# Patient Record
Sex: Female | Born: 1960
Health system: Southern US, Community
[De-identification: ages and names within clinical notes are randomized; demographics above are authoritative.]

## PROBLEM LIST (undated history)

## (undated) DIAGNOSIS — M1990 Unspecified osteoarthritis, unspecified site: Secondary | ICD-10-CM

## (undated) DIAGNOSIS — D649 Anemia, unspecified: Secondary | ICD-10-CM

## (undated) DIAGNOSIS — I10 Essential (primary) hypertension: Secondary | ICD-10-CM

## (undated) DIAGNOSIS — C801 Malignant (primary) neoplasm, unspecified: Secondary | ICD-10-CM

## (undated) HISTORY — PX: TUBAL LIGATION: SHX77

## (undated) HISTORY — DX: Essential (primary) hypertension: I10

## (undated) HISTORY — DX: Anemia, unspecified: D64.9

## (undated) HISTORY — DX: Unspecified osteoarthritis, unspecified site: M19.90

---

## 1999-08-05 ENCOUNTER — Other Ambulatory Visit: Admission: RE | Admit: 1999-08-05 | Discharge: 1999-08-05 | Payer: Self-pay | Admitting: Obstetrics and Gynecology

## 2004-11-17 ENCOUNTER — Ambulatory Visit: Payer: Self-pay | Admitting: *Deleted

## 2004-11-18 ENCOUNTER — Inpatient Hospital Stay (HOSPITAL_COMMUNITY): Admission: AD | Admit: 2004-11-18 | Discharge: 2004-11-24 | Payer: Self-pay | Admitting: *Deleted

## 2004-11-18 ENCOUNTER — Ambulatory Visit: Payer: Self-pay | Admitting: Obstetrics and Gynecology

## 2012-10-09 ENCOUNTER — Ambulatory Visit: Payer: 59

## 2012-10-09 ENCOUNTER — Ambulatory Visit (INDEPENDENT_AMBULATORY_CARE_PROVIDER_SITE_OTHER): Payer: 59 | Admitting: Emergency Medicine

## 2012-10-09 VITALS — BP 172/108 | HR 122 | Temp 98.5°F | Resp 18 | Ht 63.25 in | Wt 195.6 lb

## 2012-10-09 DIAGNOSIS — M129 Arthropathy, unspecified: Secondary | ICD-10-CM

## 2012-10-09 DIAGNOSIS — M25569 Pain in unspecified knee: Secondary | ICD-10-CM

## 2012-10-09 DIAGNOSIS — M1990 Unspecified osteoarthritis, unspecified site: Secondary | ICD-10-CM

## 2012-10-09 DIAGNOSIS — I10 Essential (primary) hypertension: Secondary | ICD-10-CM

## 2012-10-09 DIAGNOSIS — I160 Hypertensive urgency: Secondary | ICD-10-CM | POA: Insufficient documentation

## 2012-10-09 LAB — COMPREHENSIVE METABOLIC PANEL
ALT: 11 U/L (ref 0–35)
AST: 15 U/L (ref 0–37)
Albumin: 4.6 g/dL (ref 3.5–5.2)
BUN: 15 mg/dL (ref 6–23)
CO2: 22 mEq/L (ref 19–32)
Calcium: 9.5 mg/dL (ref 8.4–10.5)
Glucose, Bld: 90 mg/dL (ref 70–99)
Total Protein: 7.9 g/dL (ref 6.0–8.3)

## 2012-10-09 LAB — POCT CBC
Hemoglobin: 10.7 g/dL — AB (ref 12.2–16.2)
MCHC: 30.5 g/dL — AB (ref 31.8–35.4)
MPV: 8.8 fL (ref 0–99.8)
POC Granulocyte: 6.6 (ref 2–6.9)
POC MID %: 6.3 %M (ref 0–12)
WBC: 10.2 10*3/uL (ref 4.6–10.2)

## 2012-10-09 MED ORDER — METOPROLOL TARTRATE 25 MG PO TABS: 25.0000 mg | ORAL_TABLET | Freq: Two times a day (BID) | ORAL | Status: DC

## 2012-10-09 MED ORDER — MELOXICAM 7.5 MG PO TABS: ORAL_TABLET | ORAL | Status: DC

## 2012-10-09 MED ORDER — HYDROCHLOROTHIAZIDE 12.5 MG PO TABS: 12.5000 mg | ORAL_TABLET | Freq: Every day | ORAL | Status: DC

## 2012-10-09 NOTE — Progress Notes (Signed)
  Subjective:    Patient ID: Olivia Patel, female    DOB: 1960-10-23, 52 y.o.   MRN: [id number]  HPI patient works at [employer]. She has pain in both of her knees. She has difficulty with walking. She has had no giving way and no locking. She also has a history of hypertension for at least 8 years. She's never taken medication for this the past    Review of Systems     Objective:   Physical Exam HEENT exam is unremarkable chest clear cardiac exam reveals tachycardia without murmurs or gallops. Extremities without edema.  [facility] reading (PRIMARY) by  Dr. Delorise Royals is narrowing of the medial joint space bilaterally          Assessment & Plan:  Will treat with Metoprolol 25 bid HCTZ 12.5 QD Will treat arthritis in knees with Mobic

## 2012-10-09 NOTE — Patient Instructions (Signed)

## 2012-10-23 ENCOUNTER — Ambulatory Visit (INDEPENDENT_AMBULATORY_CARE_PROVIDER_SITE_OTHER): Payer: 59 | Admitting: Emergency Medicine

## 2012-10-23 VITALS — BP 140/88 | HR 78 | Temp 98.7°F | Resp 16 | Ht 63.0 in | Wt 196.6 lb

## 2012-10-23 DIAGNOSIS — I10 Essential (primary) hypertension: Secondary | ICD-10-CM

## 2012-10-23 DIAGNOSIS — M25569 Pain in unspecified knee: Secondary | ICD-10-CM

## 2012-10-23 NOTE — Patient Instructions (Signed)

## 2012-10-23 NOTE — Progress Notes (Signed)
[facility] [address] [place] [address] [phone]  Date:  10/23/2012   Name:  Olivia Patel   DOB:  1960/06/14   MRN:  [id number]  PCP:  No PCP Per Patient    Chief Complaint: Follow-up   History of Present Illness:  Olivia Patel is a 52 y.o. very pleasant female patient who presents with the following:  Seen two weeks ago by DR Cleta Alberts and put on toprol for hypertension. She said she feels better blood pressure wise but is somewhat tired as an effect of the medications.  Says she has no end organ symptoms.  Regarding her knee pain, she is experiencing some improvement with her medication.  Her pain is less and she has better mobility and less effusion.  Taking 7.5 mg daily.  No improvement with over the counter medications or other home remedies. Denies other complaint or health concern today.   Patient Active Problem List   Diagnosis Date Noted  . Unspecified essential hypertension 10/09/2012  . Arthritis 10/09/2012    History reviewed. No pertinent past medical history.  History reviewed. No pertinent past surgical history.  History  Substance Use Topics  . Smoking status: Never Smoker   . Smokeless tobacco: Not on file  . Alcohol Use: Not on file    Family History  Problem Relation Age of Onset  . Hypertension Mother   . Diabetes Sister     No Known Allergies  Medication list has been reviewed and updated.  Current Outpatient Prescriptions on File Prior to Visit  Medication Sig Dispense Refill  . hydrochlorothiazide (HYDRODIURIL) 12.5 MG tablet Take 1 tablet (12.5 mg total) by mouth daily.  30 tablet  5  . ibuprofen (ADVIL,MOTRIN) 200 MG tablet Take 200 mg by mouth every 6 (six) hours as needed for pain.      . meloxicam (MOBIC) 7.5 MG tablet Take 1 to 2 tabs daily  30 tablet  0  . metoprolol tartrate (LOPRESSOR) 25 MG tablet Take 1 tablet (25 mg total) by mouth 2 (two) times daily.  60 tablet  5   No current  facility-administered medications on file prior to visit.    Review of Systems:  As per HPI, otherwise negative.    Physical Examination: Filed Vitals:   10/23/12 1553  BP: 140/88  Pulse: 78  Temp: 98.7 F (37.1 C)  Resp: 16   Filed Vitals:   10/23/12 1553  Height: 5\' 3"  (1.6 m)  Weight: 196 lb 9.6 oz (89.177 kg)   Body mass index is 34.83 kg/(m^2). Ideal Body Weight: Weight in (lb) to have BMI = 25: 140.8   GEN: WDWN, NAD, Non-toxic, Alert & Oriented x 3 HEENT: Atraumatic, Normocephalic.  Ears and Nose: No external deformity. EXTR: No clubbing/cyanosis/edema NEURO: Normal gait.  PSYCH: Normally interactive. Conversant. Not depressed or anxious appearing.  Calm demeanor.  RIGHT knee no effusion.  Assessment and Plan: Hypertension under better control Knee pain Increase mobic to 15. Mg daily Follow up with DR Daub in 104   Signed,  Phillips Odor, MD

## 2013-04-10 DIAGNOSIS — Z0271 Encounter for disability determination: Secondary | ICD-10-CM

## 2013-05-22 ENCOUNTER — Other Ambulatory Visit: Payer: Self-pay | Admitting: Emergency Medicine

## 2013-06-15 ENCOUNTER — Other Ambulatory Visit: Payer: Self-pay | Admitting: Physician Assistant

## 2015-09-01 ENCOUNTER — Telehealth: Payer: Self-pay | Admitting: Family

## 2015-09-01 ENCOUNTER — Other Ambulatory Visit: Payer: Self-pay | Admitting: Family

## 2015-09-01 ENCOUNTER — Encounter: Payer: Self-pay | Admitting: Family

## 2015-09-01 ENCOUNTER — Ambulatory Visit (INDEPENDENT_AMBULATORY_CARE_PROVIDER_SITE_OTHER): Payer: Medicare Other | Admitting: Family

## 2015-09-01 ENCOUNTER — Other Ambulatory Visit (INDEPENDENT_AMBULATORY_CARE_PROVIDER_SITE_OTHER): Payer: Medicare Other

## 2015-09-01 VITALS — BP 182/102 | HR 103 | Temp 98.1°F | Resp 16 | Ht 63.25 in | Wt 207.0 lb

## 2015-09-01 DIAGNOSIS — I160 Hypertensive urgency: Secondary | ICD-10-CM | POA: Diagnosis not present

## 2015-09-01 DIAGNOSIS — I10 Essential (primary) hypertension: Secondary | ICD-10-CM | POA: Diagnosis not present

## 2015-09-01 DIAGNOSIS — M1990 Unspecified osteoarthritis, unspecified site: Secondary | ICD-10-CM | POA: Diagnosis not present

## 2015-09-01 DIAGNOSIS — Z23 Encounter for immunization: Secondary | ICD-10-CM | POA: Diagnosis not present

## 2015-09-01 DIAGNOSIS — Z124 Encounter for screening for malignant neoplasm of cervix: Secondary | ICD-10-CM

## 2015-09-01 DIAGNOSIS — Z1231 Encounter for screening mammogram for malignant neoplasm of breast: Secondary | ICD-10-CM | POA: Diagnosis not present

## 2015-09-01 LAB — BASIC METABOLIC PANEL
BUN: 10 mg/dL (ref 6–23)
CO2: 29 mEq/L (ref 19–32)
Calcium: 9.5 mg/dL (ref 8.4–10.5)
Chloride: 102 mEq/L (ref 96–112)
Creatinine, Ser: 0.86 mg/dL (ref 0.40–1.20)
GFR: 88.87 mL/min (ref >60.00)
Glucose, Bld: 106 mg/dL — ABNORMAL HIGH (ref 70–99)
POTASSIUM: 4.1 meq/L (ref 3.5–5.1)
Sodium: 138 mEq/L (ref 135–145)

## 2015-09-01 MED ORDER — LABETALOL HCL 100 MG PO TABS: 100.0000 mg | ORAL_TABLET | Freq: Two times a day (BID) | ORAL | Status: DC

## 2015-09-01 MED ORDER — MELOXICAM 15 MG PO TABS: 15.0000 mg | ORAL_TABLET | Freq: Every day | ORAL | Status: DC

## 2015-09-01 MED ORDER — HYDROCHLOROTHIAZIDE 12.5 MG PO TABS: 12.5000 mg | ORAL_TABLET | Freq: Every day | ORAL | Status: DC

## 2015-09-01 NOTE — Assessment & Plan Note (Signed)
Blood pressure elevated above goal 140/90 with current regimen and places patient and hypertensive urgency. Start labetalol. Start hydrochlorothiazide. Encouraged to monitor blood pressure at home. Obtain basic metabolic panel to check baseline potassium and kidney function. No symptoms of end organ damage presently. Denies worse headache of her life. Information regarding Dash eating plan provided. Follow-up in 2 weeks or sooner if symptoms worsen.

## 2015-09-01 NOTE — Progress Notes (Signed)
Subjective:    Patient ID: Olivia Patel, female    DOB: 07-Oct-1960, 55 y.o.   MRN: [id number]  Chief Complaint  Patient presents with  . Establish Care    having issues with both knees, hip shoulder and hands, has arthritis    HPI:  Olivia Patel is a 55 y.o. female who  has a past medical history of Hypertension and Arthritis. and presents today to establish care.   1.) Hypertension - Previously diagnosed with hypertension and does have a significant family history of hypertension. Previously maintained on metoprolol and hydrochlorothiazide. Does not currently take her blood pressure at home. Denies symptoms of end organ damage.  BP Readings from Last 3 Encounters:  09/01/15 182/102  10/23/12 140/88  10/09/12 172/108    2.) Arthritis - Associated symptom of pain located in multiple joints including her bilateral knees, left hip and right shoulder have been going on for about 3 years. 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 Modifying factors include ibuprofen and Tylenol which does help with her pain when she takes it. No structured exercise. Course of the symptoms has progressively been worsening. Severity is enough to effect her activities on a daily basis.   No Known Allergies   Outpatient Prescriptions Prior to Visit  Medication Sig Dispense Refill  . hydrochlorothiazide (MICROZIDE) 12.5 MG capsule Take 1 capsule (12.5 mg total) by mouth daily. PATIENT NEEDS OFFICE VISIT FOR ADDITIONAL REFILLS 30 capsule 0  . ibuprofen (ADVIL,MOTRIN) 200 MG tablet Take 200 mg by mouth every 6 (six) hours as needed for pain.    . meloxicam (MOBIC) 7.5 MG tablet Take 1 to 2 tabs daily 30 tablet 0  . metoprolol tartrate (LOPRESSOR) 25 MG tablet Take 1 tablet (25 mg total) by mouth 2 (two) times daily. PATIENT NEEDS OFFICE VISIT FOR ADDITIONAL REFILLS 60 tablet 0   No facility-administered medications prior to visit.     Past  Medical History  Diagnosis Date  . Hypertension   . Arthritis      History reviewed. No pertinent past surgical history.   Family History  Problem Relation Age of Onset  . Hypertension Mother   . Diabetes Mother   . Diabetes Sister   . Hypertension Father      Social History   Social History  . Marital Status: Single    Spouse Name: N/A  . Number of Children: 6  . Years of Education: 12   Occupational History  . Disability    Social History Main Topics  . Smoking status: Never Smoker   . Smokeless tobacco: Never Used  . Alcohol Use: No  . Drug Use: No  . Sexual Activity: Not on file   Other Topics Concern  . Not on file   Social History Narrative     Review of Systems  Constitutional: Negative for fever and chills.  Respiratory: Negative for chest tightness and shortness of breath.   Cardiovascular: Negative for chest pain, palpitations and leg swelling.  Neurological: Negative for dizziness, weakness and headaches.      Objective:    BP 182/102 mmHg  Pulse 103  Temp(Src) 98.1 F (36.7 C) (Oral)  Resp 16  Ht 5' 3.25" (1.607 m)  Wt 207 lb (93.895 kg)  BMI 36.36 kg/m2  SpO2 97% Nursing note and vital signs reviewed.  Physical Exam  Constitutional: She is oriented to person, place, and time. She appears well-developed and well-nourished. No distress.  Cardiovascular:  Normal rate, regular rhythm, normal heart sounds and intact distal pulses.   Pulmonary/Chest: Effort normal and breath sounds normal.  Musculoskeletal:  Bilateral knees - no obvious deformity, discoloration, or edema noted. No palpable tenderness able to be elicited. There is discomfort with active range of motion bilaterally. Distal pulses and sensation are intact and appropriate. Ligamentous and meniscal testing are negative.  Neurological: She is alert and oriented to person, place, and time.  Skin: Skin is warm and dry.  Psychiatric: She has a normal mood and affect. Her behavior  is normal. Judgment and thought content normal.       Assessment & Plan:   Problem List Items Addressed This Visit      Cardiovascular and Mediastinum   Hypertensive urgency - Primary    Blood pressure elevated above goal 140/90 with current regimen and places patient and hypertensive urgency. Start labetalol. Start hydrochlorothiazide. Encouraged to monitor blood pressure at home. Obtain basic metabolic panel to check baseline potassium and kidney function. No symptoms of end organ damage presently. Denies worse headache of her life. Information regarding Dash eating plan provided. Follow-up in 2 weeks or sooner if symptoms worsen.      Relevant Medications   labetalol (NORMODYNE) 100 MG tablet   hydrochlorothiazide (HYDRODIURIL) 12.5 MG tablet   Other Relevant Orders   Basic Metabolic Panel (BMET)     Musculoskeletal and Integument   Arthritis    Symptoms and exam consistent with osteoarthritis and confirmed by previous x-rays in 2014. Treat conservatively with Mobic, ice, and home exercise therapy. If symptoms worsen or do not improve consider cortisone injections.      Relevant Medications   meloxicam (MOBIC) 15 MG tablet    Other Visit Diagnoses    Cervical cancer screening        Relevant Orders    Ambulatory referral to Gynecology    Encounter for screening mammogram for breast cancer        Relevant Orders    MM DIGITAL SCREENING BILATERAL        I have discontinued Ms. Iacovelli's ibuprofen, meloxicam, hydrochlorothiazide, and metoprolol tartrate. I am also having her start on meloxicam, labetalol, and hydrochlorothiazide.   Meds ordered this encounter  Medications  . meloxicam (MOBIC) 15 MG tablet    Sig: Take 1 tablet (15 mg total) by mouth daily.    Dispense:  30 tablet    Refill:  2    Order Specific Question:  Supervising Provider    Answer:  Pricilla Holm A [[id number]]  . labetalol (NORMODYNE) 100 MG tablet    Sig: Take 1 tablet (100 mg total) by mouth 2  (two) times daily.    Dispense:  60 tablet    Refill:  0    Order Specific Question:  Supervising Provider    Answer:  Pricilla Holm A [[id number]]  . hydrochlorothiazide (HYDRODIURIL) 12.5 MG tablet    Sig: Take 1 tablet (12.5 mg total) by mouth daily.    Dispense:  30 tablet    Refill:  1    Order Specific Question:  Supervising Provider    Answer:  Pricilla Holm A [[id number]]     Follow-up: Return in about 2 weeks (around 09/15/2015).  Mauricio Po, FNP

## 2015-09-01 NOTE — Telephone Encounter (Signed)
Please inform patient that her kidney function and electrolytes are within the normal ranges. Please continue to take the blood pressure medications as prescribed today and follow-up in 2 weeks.

## 2015-09-01 NOTE — Assessment & Plan Note (Signed)
Symptoms and exam consistent with osteoarthritis and confirmed by previous x-rays in 2014. Treat conservatively with Mobic, ice, and home exercise therapy. If symptoms worsen or do not improve consider cortisone injections.

## 2015-09-01 NOTE — Progress Notes (Signed)
Pre visit review using our clinic review tool, if applicable. No additional management support is needed unless otherwise documented below in the visit note. 

## 2015-09-01 NOTE — Patient Instructions (Signed)
Thank you for choosing [facility].  Summary/Instructions:  Ice your knees and shoulder 2-3 times per day and after activity.  Start the mobic as needed for inflammation and pain.  They will call to schedule your mammogram and gynecology appointment.   Please monitor your blood pressure at home.   Your prescription(s) have been submitted to your pharmacy or been printed and provided for you. Please take as directed and contact our office if you believe you are having problem(s) with the medication(s) or have any questions.  Please stop by the lab on the basement level of the building for your blood work. Your results will be released to [facility] (or called to you) after review, usually within 72 hours after test completion. If any changes need to be made, you will be notified at that same time.  Please stop by radiology on the basement level of the building for your x-rays. Your results will be released to [facility] (or called to you) after review, usually within 72 hours after test completion. If any treatments or changes are necessary, you will be notified at that same time.  Referrals have been made during this visit. You should expect to hear back from our schedulers in about 7-10 days in regards to establishing an appointment with the specialists we discussed.   If your symptoms worsen or fail to improve, please contact our office for further instruction, or in case of emergency go directly to the emergency room at the closest medical facility.   Hypertension Hypertension, commonly called high blood pressure, is when the force of blood pumping through your arteries is too strong. Your arteries are the blood vessels that carry blood from your heart throughout your body. A blood pressure reading consists of a higher number over a lower number, such as 110/72. The higher number (systolic) is the pressure inside your arteries when your heart pumps. The lower number (diastolic) is the  pressure inside your arteries when your heart relaxes. Ideally you want your blood pressure below 120/80. Hypertension forces your heart to work harder to pump blood. Your arteries may become narrow or stiff. Having untreated or uncontrolled hypertension can cause heart attack, stroke, kidney disease, and other problems. RISK FACTORS Some risk factors for high blood pressure are controllable. Others are not.  Risk factors you cannot control include:   Race. You may be at higher risk if you are African American.  Age. Risk increases with age.  Gender. Men are at higher risk than women before age 82 years. After age 2, women are at higher risk than men. Risk factors you can control include:  Not getting enough exercise or physical activity.  Being overweight.  Getting too much fat, sugar, calories, or salt in your diet.  Drinking too much alcohol. SIGNS AND SYMPTOMS Hypertension does not usually cause signs or symptoms. Extremely high blood pressure (hypertensive crisis) may cause headache, anxiety, shortness of breath, and nosebleed. DIAGNOSIS To check if you have hypertension, your health care provider will measure your blood pressure while you are seated, with your arm held at the level of your heart. It should be measured at least twice using the same arm. Certain conditions can cause a difference in blood pressure between your right and left arms. A blood pressure reading that is higher than normal on one occasion does not mean that you need treatment. If it is not clear whether you have high blood pressure, you may be asked to return on a different day to  have your blood pressure checked again. Or, you may be asked to monitor your blood pressure at home for 1 or more weeks. TREATMENT Treating high blood pressure includes making lifestyle changes and possibly taking medicine. Living a healthy lifestyle can help lower high blood pressure. You may need to change some of your  habits. Lifestyle changes may include:  Following the DASH diet. This diet is high in fruits, vegetables, and whole grains. It is low in salt, red meat, and added sugars.  Keep your sodium intake below 2,300 mg per day.  Getting at least 30-45 minutes of aerobic exercise at least 4 times per week.  Losing weight if necessary.  Not smoking.  Limiting alcoholic beverages.  Learning ways to reduce stress. Your health care provider may prescribe medicine if lifestyle changes are not enough to get your blood pressure under control, and if one of the following is true:  You are 43-28 years of age and your systolic blood pressure is above 140.  You are 54 years of age or older, and your systolic blood pressure is above 150.  Your diastolic blood pressure is above 90.  You have diabetes, and your systolic blood pressure is over 761 or your diastolic blood pressure is over 90.  You have kidney disease and your blood pressure is above 140/90.  You have heart disease and your blood pressure is above 140/90. Your personal target blood pressure may vary depending on your medical conditions, your age, and other factors. HOME CARE INSTRUCTIONS  Have your blood pressure rechecked as directed by your health care provider.   Take medicines only as directed by your health care provider. Follow the directions carefully. Blood pressure medicines must be taken as prescribed. The medicine does not work as well when you skip doses. Skipping doses also puts you at risk for problems.  Do not smoke.   Monitor your blood pressure at home as directed by your health care provider. SEEK MEDICAL CARE IF:   You think you are having a reaction to medicines taken.  You have recurrent headaches or feel dizzy.  You have swelling in your ankles.  You have trouble with your vision. SEEK IMMEDIATE MEDICAL CARE IF:  You develop a severe headache or confusion.  You have unusual weakness, numbness, or  feel faint.  You have severe chest or abdominal pain.  You vomit repeatedly.  You have trouble breathing. MAKE SURE YOU:   Understand these instructions.  Will watch your condition.  Will get help right away if you are not doing well or get worse.   This information is not intended to replace advice given to you by your health care provider. Make sure you discuss any questions you have with your health care provider.   Document Released: 04/12/2005 Document Revised: 08/27/2014 Document Reviewed: 02/02/2013 Elsevier Interactive Patient Education 2016 [place] DASH stands for "Dietary Approaches to Stop Hypertension." The DASH eating plan is a healthy eating plan that has been shown to reduce high blood pressure (hypertension). Additional health benefits may include reducing the risk of type 2 diabetes mellitus, heart disease, and stroke. The DASH eating plan may also help with weight loss. WHAT DO I NEED TO KNOW ABOUT THE DASH EATING PLAN? For the DASH eating plan, you will follow these general guidelines:  Choose foods with a percent daily value for sodium of less than 5% (as listed on the food label).  Use salt-free seasonings or herbs instead of table  salt or sea salt.  Check with your health care provider or pharmacist before using salt substitutes.  Eat lower-sodium products, often labeled as "lower sodium" or "no salt added."  Eat fresh foods.  Eat more vegetables, fruits, and low-fat dairy products.  Choose whole grains. Look for the word "whole" as the first word in the ingredient list.  Choose fish and skinless chicken or [place] more often than red meat. Limit fish, poultry, and meat to 6 oz (170 g) each day.  Limit sweets, desserts, sugars, and sugary drinks.  Choose heart-healthy fats.  Limit cheese to 1 oz (28 g) per day.  Eat more home-cooked food and less restaurant, buffet, and fast food.  Limit fried foods.  Cook foods using  methods other than frying.  Limit canned vegetables. If you do use them, rinse them well to decrease the sodium.  When eating at a restaurant, ask that your food be prepared with less salt, or no salt if possible. WHAT FOODS CAN I EAT? Seek help from a dietitian for individual calorie needs. Grains Whole grain or whole wheat bread. Brown rice. Whole grain or whole wheat pasta. Quinoa, bulgur, and whole grain cereals. Low-sodium cereals. Corn or whole wheat flour tortillas. Whole grain cornbread. Whole grain crackers. Low-sodium crackers. Vegetables Fresh or frozen vegetables (raw, steamed, roasted, or grilled). Low-sodium or reduced-sodium tomato and vegetable juices. Low-sodium or reduced-sodium tomato sauce and paste. Low-sodium or reduced-sodium canned vegetables.  Fruits All fresh, canned (in natural juice), or frozen fruits. Meat and Other Protein Products Ground beef (85% or leaner), grass-fed beef, or beef trimmed of fat. Skinless chicken or [place]. Ground chicken or [place]. Pork trimmed of fat. All fish and seafood. Eggs. Dried beans, peas, or lentils. Unsalted nuts and seeds. Unsalted canned beans. Dairy Low-fat dairy products, such as skim or 1% milk, 2% or reduced-fat cheeses, low-fat ricotta or cottage cheese, or plain low-fat yogurt. Low-sodium or reduced-sodium cheeses. Fats and Oils Tub margarines without trans fats. Light or reduced-fat mayonnaise and salad dressings (reduced sodium). Avocado. Safflower, olive, or canola oils. Natural peanut or almond butter. Other Unsalted popcorn and pretzels. The items listed above may not be a complete list of recommended foods or beverages. Contact your dietitian for more options. WHAT FOODS ARE NOT RECOMMENDED? Grains White bread. White pasta. White rice. Refined cornbread. Bagels and croissants. Crackers that contain trans fat. Vegetables Creamed or fried vegetables. Vegetables in a cheese sauce. Regular canned vegetables. Regular  canned tomato sauce and paste. Regular tomato and vegetable juices. Fruits Dried fruits. Canned fruit in light or heavy syrup. Fruit juice. Meat and Other Protein Products Fatty cuts of meat. Ribs, chicken wings, bacon, sausage, bologna, salami, chitterlings, fatback, hot dogs, bratwurst, and packaged luncheon meats. Salted nuts and seeds. Canned beans with salt. Dairy Whole or 2% milk, cream, half-and-half, and cream cheese. Whole-fat or sweetened yogurt. Full-fat cheeses or blue cheese. Nondairy creamers and whipped toppings. Processed cheese, cheese spreads, or cheese curds. Condiments Onion and garlic salt, seasoned salt, table salt, and sea salt. Canned and packaged gravies. Worcestershire sauce. Tartar sauce. Barbecue sauce. Teriyaki sauce. Soy sauce, including reduced sodium. Steak sauce. Fish sauce. Oyster sauce. Cocktail sauce. Horseradish. Ketchup and mustard. Meat flavorings and tenderizers. Bouillon cubes. Hot sauce. Tabasco sauce. Marinades. Taco seasonings. Relishes. Fats and Oils Butter, stick margarine, lard, shortening, ghee, and bacon fat. Coconut, palm kernel, or palm oils. Regular salad dressings. Other Pickles and olives. Salted popcorn and pretzels. The items listed above may not be  a complete list of foods and beverages to avoid. Contact your dietitian for more information. WHERE CAN I FIND MORE INFORMATION? National Heart, Lung, and Blood Institute: travelstabloid.com   This information is not intended to replace advice given to you by your health care provider. Make sure you discuss any questions you have with your health care provider.   Document Released: 04/01/2011 Document Revised: 05/03/2014 Document Reviewed: 02/14/2013 Elsevier Interactive Patient Education 2016 Elsevier Inc.  Osteoarthritis Osteoarthritis is a disease that causes soreness and inflammation of a joint. It occurs when the cartilage at the affected joint wears  down. Cartilage acts as a cushion, covering the ends of bones where they meet to form a joint. Osteoarthritis is the most common form of arthritis. It often occurs in older people. The joints affected most often by this condition include those in the:  Ends of the fingers.  Thumbs.  Neck.  Lower back.  Knees.  Hips. CAUSES  Over time, the cartilage that covers the ends of bones begins to wear away. This causes bone to rub on bone, producing pain and stiffness in the affected joints.  RISK FACTORS Certain factors can increase your chances of having osteoarthritis, including:  Older age.  Excessive body weight.  Overuse of joints.  Previous joint injury. SIGNS AND SYMPTOMS   Pain, swelling, and stiffness in the joint.  Over time, the joint may lose its normal shape.  Small deposits of bone (osteophytes) may grow on the edges of the joint.  Bits of bone or cartilage can break off and float inside the joint space. This may cause more pain and damage. DIAGNOSIS  Your health care provider will do a physical exam and ask about your symptoms. Various tests may be ordered, such as:  X-rays of the affected joint.  Blood tests to rule out other types of arthritis. Additional tests may be used to diagnose your condition. TREATMENT  Goals of treatment are to control pain and improve joint function. Treatment plans may include:  A prescribed exercise program that allows for rest and joint relief.  A weight control plan.  Pain relief techniques, such as:  Properly applied heat and cold.  Electric pulses delivered to nerve endings under the skin (transcutaneous electrical nerve stimulation [TENS]).  Massage.  Certain nutritional supplements.  Medicines to control pain, such as:  Acetaminophen.  Nonsteroidal anti-inflammatory drugs (NSAIDs), such as naproxen.  Narcotic or central-acting agents, such as tramadol.  Corticosteroids. These can be given orally or as an  injection.  Surgery to reposition the bones and relieve pain (osteotomy) or to remove loose pieces of bone and cartilage. Joint replacement may be needed in advanced states of osteoarthritis. HOME CARE INSTRUCTIONS   Take medicines only as directed by your health care provider.  Maintain a healthy weight. Follow your health care provider's instructions for weight control. This may include dietary instructions.  Exercise as directed. Your health care provider can recommend specific types of exercise. These may include:  Strengthening exercises. These are done to strengthen the muscles that support joints affected by arthritis. They can be performed with weights or with exercise bands to add resistance.  Aerobic activities. These are exercises, such as brisk walking or low-impact aerobics, that get your heart pumping.  Range-of-motion activities. These keep your joints limber.  Balance and agility exercises. These help you maintain daily living skills.  Rest your affected joints as directed by your health care provider.  Keep all follow-up visits as directed by your health  care provider. SEEK MEDICAL CARE IF:   Your skin turns red.  You develop a rash in addition to your joint pain.  You have worsening joint pain.  You have a fever along with joint or muscle aches. SEEK IMMEDIATE MEDICAL CARE IF:  You have a significant loss of weight or appetite.  You have night sweats. [place] of Arthritis and Musculoskeletal and Skin Diseases: www.niams.SouthExposed.es  [employer] on Aging: http://kim-miller.com/  American College of Rheumatology: www.rheumatology.org   This information is not intended to replace advice given to you by your health care provider. Make sure you discuss any questions you have with your health care provider.   Document Released: 04/12/2005 Document Revised: 05/03/2014 Document Reviewed: 12/18/2012 Elsevier Interactive Patient  Education [employer].

## 2015-09-02 NOTE — Telephone Encounter (Signed)
Pt aware of results and follow up appt scheduled.

## 2015-09-10 ENCOUNTER — Encounter: Payer: Self-pay | Admitting: Obstetrics and Gynecology

## 2015-09-10 ENCOUNTER — Ambulatory Visit (INDEPENDENT_AMBULATORY_CARE_PROVIDER_SITE_OTHER): Payer: Medicare Other | Admitting: Obstetrics and Gynecology

## 2015-09-10 VITALS — BP 140/80 | HR 80 | Resp 16 | Ht 63.25 in | Wt 205.0 lb

## 2015-09-10 DIAGNOSIS — N926 Irregular menstruation, unspecified: Secondary | ICD-10-CM | POA: Diagnosis not present

## 2015-09-10 DIAGNOSIS — Z01419 Encounter for gynecological examination (general) (routine) without abnormal findings: Secondary | ICD-10-CM | POA: Diagnosis not present

## 2015-09-10 DIAGNOSIS — Z124 Encounter for screening for malignant neoplasm of cervix: Secondary | ICD-10-CM | POA: Diagnosis not present

## 2015-09-10 DIAGNOSIS — Z1211 Encounter for screening for malignant neoplasm of colon: Secondary | ICD-10-CM | POA: Diagnosis not present

## 2015-09-10 DIAGNOSIS — N951 Menopausal and female climacteric states: Secondary | ICD-10-CM

## 2015-09-10 DIAGNOSIS — N841 Polyp of cervix uteri: Secondary | ICD-10-CM | POA: Diagnosis not present

## 2015-09-10 MED ORDER — MEDROXYPROGESTERONE ACETATE 5 MG PO TABS: ORAL_TABLET | ORAL | Status: DC

## 2015-09-10 NOTE — Progress Notes (Signed)
Patient ID: Olivia Patel, female   DOB: January 23, 1961, 55 y.o.   MRN: [id number] 56 y.o. [place] here for annual exam.  Until last year she was having regular cycles. Since last June she had a slight spotting in August and December, in February of this year she had a light day of bleeding. She has hot flashes, tolerable. Occasional night sweats. No vaginal dryness, not sexually active.  Period Duration (Days): only had two cyles in the last year about 2 days  Period Pattern: (!) Irregular Menstrual Flow: Light Menstrual Control: Thin pad Dysmenorrhea: None  Patient's last menstrual period was 05/29/2015.          Sexually active: No.  The current method of family planning is tubal ligation.    Exercising: No.  The patient does not participate in regular exercise at present. Smoker:  no  Health Maintenance: Pap:  2007 WNL per patient  History of abnormal Pap:  no MMG:  Never- scheduled  For 09-15-15  Colonoscopy:  Never BMD:   Never TDaP:  08-31-15 Gardasil: N/A   reports that she has never smoked. She has never used smokeless tobacco. She reports that she does not drink alcohol or use illicit drugs.She has 6 kids, 11-32 is the range. 8 grandchildren, 4 boys and 4 girls. Everyone is local. She is on disability for arthritis and HTN. Her 53 year old and 53 year old are at home.   Past Medical History  Diagnosis Date  . Hypertension   . Arthritis   . Anemia     Past Surgical History  Procedure Laterality Date  . Tubal ligation      Current Outpatient Prescriptions  Medication Sig Dispense Refill  . hydrochlorothiazide (HYDRODIURIL) 12.5 MG tablet Take 1 tablet (12.5 mg total) by mouth daily. 30 tablet 1  . labetalol (NORMODYNE) 100 MG tablet Take 1 tablet (100 mg total) by mouth 2 (two) times daily. 60 tablet 0  . meloxicam (MOBIC) 15 MG tablet Take 1 tablet (15 mg total) by mouth daily. 30 tablet 2   No current facility-administered medications for this  visit.    Family History  Problem Relation Age of Onset  . Hypertension Mother   . Diabetes Mother   . Diabetes Sister   . Hypertension Father   . Kidney failure Father     Review of Systems  Constitutional: Negative.   HENT: Negative.   Eyes: Negative.   Respiratory: Negative.   Cardiovascular: Negative.   Gastrointestinal: Negative.   Endocrine: Negative.   Genitourinary: Negative.   Musculoskeletal: Negative.   Skin: Negative.   Allergic/Immunologic: Negative.   Neurological: Negative.   Psychiatric/Behavioral: Negative.     Exam:   BP 140/80 mmHg  Pulse 80  Resp 16  Ht 5' 3.25" (1.607 m)  Wt 205 lb (92.987 kg)  BMI 36.01 kg/m2  LMP 05/29/2015  Weight change: '@WEIGHTCHANGE'$ @ Height:   Height: 5' 3.25" (160.7 cm)  Ht Readings from Last 3 Encounters:  09/10/15 5' 3.25" (1.607 m)  09/01/15 5' 3.25" (1.607 m)  10/23/12 '5\' 3"'$  (1.6 m)    General appearance: alert, cooperative and appears stated age Head: Normocephalic, without obvious abnormality, atraumatic Neck: no adenopathy, supple, symmetrical, trachea midline and thyroid normal to inspection and palpation Lungs: clear to auscultation bilaterally Breasts: normal appearance, no masses or tenderness Heart: regular rate and rhythm Abdomen: soft, non-tender; bowel sounds normal; no masses,  no organomegaly Extremities: extremities normal, atraumatic, no cyanosis or edema Skin: Skin color, texture,  turgor normal. No rashes or lesions Lymph nodes: Cervical, supraclavicular, and axillary nodes normal. No abnormal inguinal nodes palpated Neurologic: Grossly normal   Pelvic: External genitalia:  no lesions              Urethra:  normal appearing urethra with no masses, tenderness or lesions              Bartholins and Skenes: normal                 Vagina: normal appearing vagina with normal color and discharge, no lesions              Cervix: large cervical polyp. Removed with ringed forcep                Bimanual Exam:  Uterus:  normal size, contour, position, consistency, mobility, non-tender              Adnexa: no mass, fullness, tenderness               Rectovaginal: Confirms               Anus:  normal sphincter tone, no lesions  Chaperone was present for exam.  A:  Well Woman with normal exam  Irregular menses, perimenopausal   Cervical polyp (could be responsible for her spotting)  Perimenopausal, tolerable vasomotor symptoms  P:   Pap with hpv  Mammogram  Colonoscopy (we will schedule)  Discussed breast self exam  Discussed calcium and vit D intake  Will start cyclic provera, she will call with or without a cycle  Cervical polyp removed  She will get her labs/immunizations with her primary  CC: Mauricio Po, FNP

## 2015-09-10 NOTE — Patient Instructions (Addendum)
EXERCISE AND DIET:  We recommended that you start or continue a regular exercise program for good health. Regular exercise means any activity that makes your heart beat faster and makes you sweat.  We recommend exercising at least 30 minutes per day at least 3 days a week, preferably 4 or 5.  We also recommend a diet low in fat and sugar.  Inactivity, poor dietary choices and obesity can cause diabetes, heart attack, stroke, and kidney damage, among others.    ALCOHOL AND SMOKING:  Women should limit their alcohol intake to no more than 7 drinks/beers/glasses of wine (combined, not each!) per week. Moderation of alcohol intake to this level decreases your risk of breast cancer and liver damage. And of course, no recreational drugs are part of a healthy lifestyle.  And absolutely no smoking or even second hand smoke. Most people know smoking can cause heart and lung diseases, but did you know it also contributes to weakening of your bones? Aging of your skin?  Yellowing of your teeth and nails?  CALCIUM AND VITAMIN D:  Adequate intake of calcium and Vitamin D are recommended.  The recommendations for exact amounts of these supplements seem to change often, but generally speaking 600 mg of calcium (either carbonate or citrate) and 800 units of Vitamin D per day seems prudent. Certain women may benefit from higher intake of Vitamin D.  If you are among these women, your doctor will have told you during your visit.    PAP SMEARS:  Pap smears, to check for cervical cancer or precancers,  have traditionally been done yearly, although recent scientific advances have shown that most women can have pap smears less often.  However, every woman still should have a physical exam from her gynecologist every year. It will include a breast check, inspection of the vulva and vagina to check for abnormal growths or skin changes, a visual exam of the cervix, and then an exam to evaluate the size and shape of the uterus and  ovaries.  And after 53 years of age, a rectal exam is indicated to check for rectal cancers. We will also provide age appropriate advice regarding health maintenance, like when you should have certain vaccines, screening for sexually transmitted diseases, bone density testing, colonoscopy, mammograms, etc.   MAMMOGRAMS:  All women over 40 years old should have a yearly mammogram. Many facilities now offer a "3D" mammogram, which may cost around $50 extra out of pocket. If possible,  we recommend you accept the option to have the 3D mammogram performed.  It both reduces the number of women who will be called back for extra views which then turn out to be normal, and it is better than the routine mammogram at detecting truly abnormal areas.    COLONOSCOPY:  Colonoscopy to screen for colon cancer is recommended for all women at age 50.  We know, you hate the idea of the prep.  We agree, BUT, having colon cancer and not knowing it is worse!!  Colon cancer so often starts as a polyp that can be seen and removed at colonscopy, which can quite literally save your life!  And if your first colonoscopy is normal and you have no family history of colon cancer, most women don't have to have it again for 10 years.  Once every ten years, you can do something that may end up saving your life, right?  We will be happy to help you get it scheduled when you are ready.    Be sure to check your insurance coverage so you understand how much it will cost.  It may be covered as a preventative service at no cost, but you should check your particular policy.     Perimenopause Perimenopause is the time when your body begins to move into the menopause (no menstrual period for 12 straight months). It is a natural process. Perimenopause can begin 2-8 years before the menopause and usually lasts for 1 year after the menopause. During this time, your ovaries may or may not produce an egg. The ovaries vary in their production of estrogen and  progesterone hormones each month. This can cause irregular menstrual periods, difficulty getting pregnant, vaginal bleeding between periods, and uncomfortable symptoms. CAUSES  Irregular production of the ovarian hormones, estrogen and progesterone, and not ovulating every month.  Other causes include:  Tumor of the pituitary gland in the brain.  Medical disease that affects the ovaries.  Radiation treatment.  Chemotherapy.  Unknown causes.  Heavy smoking and excessive alcohol intake can bring on perimenopause sooner. SIGNS AND SYMPTOMS   Hot flashes.  Night sweats.  Irregular menstrual periods.  Decreased sex drive.  Vaginal dryness.  Headaches.  Mood swings.  Depression.  Memory problems.  Irritability.  Tiredness.  Weight gain.  Trouble getting pregnant.  The beginning of losing bone cells (osteoporosis).  The beginning of hardening of the arteries (atherosclerosis). DIAGNOSIS  Your health care provider will make a diagnosis by analyzing your age, menstrual history, and symptoms. He or she will do a physical exam and note any changes in your body, especially your female organs. Female hormone tests may or may not be helpful depending on the amount of female hormones you produce and when you produce them. However, other hormone tests may be helpful to rule out other problems. TREATMENT  In some cases, no treatment is needed. The decision on whether treatment is necessary during the perimenopause should be made by you and your health care provider based on how the symptoms are affecting you and your lifestyle. Various treatments are available, such as:  Treating individual symptoms with a specific medicine for that symptom.  Herbal medicines that can help specific symptoms.  Counseling.  Group therapy. HOME CARE INSTRUCTIONS   Keep track of your menstrual periods (when they occur, how heavy they are, how long between periods, and how long they last) as  well as your symptoms and when they started.  Only take over-the-counter or prescription medicines as directed by your health care provider.  Sleep and rest.  Exercise.  Eat a diet that contains calcium (good for your bones) and soy (acts like the estrogen hormone).  Do not smoke.  Avoid alcoholic beverages.  Take vitamin supplements as recommended by your health care provider. Taking vitamin E may help in certain cases.  Take calcium and vitamin D supplements to help prevent bone loss.  Group therapy is sometimes helpful.  Acupuncture may help in some cases. SEEK MEDICAL CARE IF:   You have questions about any symptoms you are having.  You need a referral to a specialist (gynecologist, psychiatrist, or psychologist). SEEK IMMEDIATE MEDICAL CARE IF:   You have vaginal bleeding.  Your period lasts longer than 8 days.  Your periods are recurring sooner than 21 days.  You have bleeding after intercourse.  You have severe depression.  You have pain when you urinate.  You have severe headaches.  You have vision problems.   This information is not intended to replace advice   given to you by your health care provider. Make sure you discuss any questions you have with your health care provider.   Document Released: 05/20/2004 Document Revised: 05/03/2014 Document Reviewed: 11/09/2012 Elsevier Interactive Patient Education 2016 [employer]  

## 2015-09-12 ENCOUNTER — Telehealth: Payer: Self-pay | Admitting: Obstetrics and Gynecology

## 2015-09-12 LAB — IPS PAP TEST WITH HPV

## 2015-09-12 NOTE — Telephone Encounter (Signed)
Patient is scheduled for an appointment at [facility] office 09/25/15. Unable to contact patient on her contact number - receive busy signal and dpr states no voicemail set up. Called patients daughter per her dpr. Left voicemail to call back or have the patient call us regarding her appointment information.

## 2015-09-15 ENCOUNTER — Ambulatory Visit: Payer: Medicare Other

## 2015-09-17 ENCOUNTER — Ambulatory Visit (INDEPENDENT_AMBULATORY_CARE_PROVIDER_SITE_OTHER): Payer: Medicare Other | Admitting: Family

## 2015-09-17 ENCOUNTER — Encounter: Payer: Self-pay | Admitting: Family

## 2015-09-17 DIAGNOSIS — I10 Essential (primary) hypertension: Secondary | ICD-10-CM | POA: Insufficient documentation

## 2015-09-17 MED ORDER — LABETALOL HCL 100 MG PO TABS: 100.0000 mg | ORAL_TABLET | Freq: Two times a day (BID) | ORAL | Status: DC

## 2015-09-17 MED ORDER — HYDROCHLOROTHIAZIDE 12.5 MG PO TABS: 12.5000 mg | ORAL_TABLET | Freq: Every day | ORAL | Status: DC

## 2015-09-17 NOTE — Progress Notes (Signed)
Pre visit review using our clinic review tool, if applicable. No additional management support is needed unless otherwise documented below in the visit note. 

## 2015-09-17 NOTE — Assessment & Plan Note (Signed)
Blood pressure with significantly improved control since starting medications. No adverse side effects or symptoms of end organ damage. Continue to monitor blood pressure at home. Will be starting Silver Sneakers. Continue current dosage of labetalol and hydrochlorothiazide.

## 2015-09-17 NOTE — Patient Instructions (Addendum)
Thank you for choosing [facility].  Summary/Instructions:  Please continue to take the medications as prescribed.   Your prescription(s) have been submitted to your pharmacy or been printed and provided for you. Please take as directed and contact our office if you believe you are having problem(s) with the medication(s) or have any questions.  If your symptoms worsen or fail to improve, please contact our office for further instruction, or in case of emergency go directly to the emergency room at the closest medical facility.

## 2015-09-17 NOTE — Progress Notes (Signed)
Subjective:    Patient ID: Olivia Patel, female    DOB: Nov 24, 1960, 55 y.o.   MRN: [id number]  Chief Complaint  Patient presents with  . Follow-up    hypertension    HPI:  Olivia Patel is a 55 y.o. female who  has a past medical history of Hypertension; Arthritis; and Anemia. and presents today for an office follow up.  1.) Hypertension - Previously noted to have hypertensive urgency and started on hydrochlorothiazide and labetalol. Reports taking the medication as prescribed and denies adverse side effects or hypotensive readings. Denies symptoms of end organ damage. Blood pressures at home have been averaging around 140/80. Has also been waiting to start [place].  BP Readings from Last 3 Encounters:  09/17/15 142/80  09/10/15 140/80  09/01/15 182/102    No Known Allergies   Current Outpatient Prescriptions on File Prior to Visit  Medication Sig Dispense Refill  . medroxyPROGESTERone (PROVERA) 5 MG tablet Take 1 tab po qd x 5 days every other month if no spontaneous menses 15 tablet 1  . meloxicam (MOBIC) 15 MG tablet Take 1 tablet (15 mg total) by mouth daily. 30 tablet 2   No current facility-administered medications on file prior to visit.    Review of Systems  Constitutional: Negative for fever and chills.  Eyes:       Negative for changes in vision  Respiratory: Negative for cough, chest tightness and wheezing.   Cardiovascular: Negative for chest pain, palpitations and leg swelling.  Neurological: Negative for dizziness, weakness and light-headedness.      Objective:    BP 142/80 mmHg  Pulse 85  Temp(Src) 98.4 F (36.9 C) (Oral)  Resp 16  Ht 5' 3.25" (1.607 m)  Wt 205 lb 1.9 oz (93.042 kg)  BMI 36.03 kg/m2  SpO2 98%  LMP 05/29/2015 Nursing note and vital signs reviewed.  Physical Exam  Constitutional: She is oriented to person, place, and time. She appears well-developed and well-nourished. No distress.  Cardiovascular: Normal rate,  regular rhythm, normal heart sounds and intact distal pulses.   Pulmonary/Chest: Effort normal and breath sounds normal.  Neurological: She is alert and oriented to person, place, and time.  Skin: Skin is warm and dry.  Psychiatric: She has a normal mood and affect. Her behavior is normal. Judgment and thought content normal.       Assessment & Plan:   Problem List Items Addressed This Visit      Cardiovascular and Mediastinum   Essential hypertension    Blood pressure with significantly improved control since starting medications. No adverse side effects or symptoms of end organ damage. Continue to monitor blood pressure at home. Will be starting Silver Sneakers. Continue current dosage of labetalol and hydrochlorothiazide.       Relevant Medications   labetalol (NORMODYNE) 100 MG tablet   hydrochlorothiazide (HYDRODIURIL) 12.5 MG tablet       I am having Olivia Patel maintain her meloxicam, medroxyPROGESTERone, labetalol, and hydrochlorothiazide.   Meds ordered this encounter  Medications  . labetalol (NORMODYNE) 100 MG tablet    Sig: Take 1 tablet (100 mg total) by mouth 2 (two) times daily.    Dispense:  180 tablet    Refill:  0    Order Specific Question:  Supervising Provider    Answer:  Pricilla Holm A [[id number]]  . hydrochlorothiazide (HYDRODIURIL) 12.5 MG tablet    Sig: Take 1 tablet (12.5 mg total) by mouth daily.    Dispense:  90 tablet  Refill:  0    Order Specific Question:  Supervising Provider    Answer:  Pricilla Holm A [[id number]]     Follow-up: Return in about 3 months (around 12/18/2015), or if symptoms worsen or fail to improve.  Mauricio Po, FNP

## 2015-09-24 ENCOUNTER — Ambulatory Visit: Payer: Medicare Other

## 2015-10-08 ENCOUNTER — Ambulatory Visit
Admission: RE | Admit: 2015-10-08 | Discharge: 2015-10-08 | Disposition: A | Discharge status: Home / Self Care | Payer: Medicare Other | Source: Ambulatory Visit | Attending: Family | Admitting: Family

## 2015-10-08 DIAGNOSIS — Z1231 Encounter for screening mammogram for malignant neoplasm of breast: Secondary | ICD-10-CM

## 2015-10-10 ENCOUNTER — Other Ambulatory Visit: Payer: Self-pay | Admitting: Family

## 2015-10-10 DIAGNOSIS — R928 Other abnormal and inconclusive findings on diagnostic imaging of breast: Secondary | ICD-10-CM

## 2015-10-21 ENCOUNTER — Other Ambulatory Visit: Payer: Self-pay | Admitting: Family

## 2015-10-22 ENCOUNTER — Other Ambulatory Visit: Payer: Medicare Other

## 2015-11-05 ENCOUNTER — Other Ambulatory Visit: Payer: Medicare Other

## 2015-12-10 ENCOUNTER — Telehealth: Payer: Self-pay | Admitting: Emergency Medicine

## 2015-12-10 DIAGNOSIS — M1990 Unspecified osteoarthritis, unspecified site: Secondary | ICD-10-CM

## 2015-12-10 NOTE — Telephone Encounter (Signed)
Pt needs a prescription refill on meloxicam (MOBIC) 15 MG tablet. Pharmacy is [facility] on [address].Please follow up thanks.

## 2015-12-10 NOTE — Telephone Encounter (Signed)
Please advise 

## 2015-12-11 MED ORDER — MELOXICAM 15 MG PO TABS
15.0000 mg | ORAL_TABLET | Freq: Every day | ORAL | 2 refills | Status: DC
Start: 2015-12-11 — End: 2016-03-03

## 2015-12-11 NOTE — Telephone Encounter (Signed)
Done

## 2015-12-12 ENCOUNTER — Telehealth: Payer: Self-pay

## 2015-12-12 NOTE — Telephone Encounter (Signed)
Tried to call pt to let her know. Number was out of service.

## 2015-12-30 ENCOUNTER — Telehealth: Payer: Self-pay | Admitting: Family

## 2015-12-30 DIAGNOSIS — I10 Essential (primary) hypertension: Secondary | ICD-10-CM

## 2015-12-30 MED ORDER — HYDROCHLOROTHIAZIDE 12.5 MG PO TABS: 12.5000 mg | ORAL_TABLET | Freq: Every day | ORAL | 0 refills | Status: DC

## 2015-12-30 MED ORDER — LABETALOL HCL 100 MG PO TABS: 100.0000 mg | ORAL_TABLET | Freq: Two times a day (BID) | ORAL | 0 refills | Status: DC

## 2015-12-30 NOTE — Telephone Encounter (Signed)
Patient has scheduled med fu for 9/13.  Is requesting refill on hydrochlorothiazide.  Patient uses [facility] on [address].

## 2015-12-30 NOTE — Telephone Encounter (Signed)
RXs has been sent. Pt aware.

## 2015-12-30 NOTE — Telephone Encounter (Signed)
Patient is also needing labetalol

## 2016-01-07 ENCOUNTER — Encounter: Payer: Self-pay | Admitting: Family

## 2016-01-07 ENCOUNTER — Ambulatory Visit (INDEPENDENT_AMBULATORY_CARE_PROVIDER_SITE_OTHER): Payer: Medicare Other | Admitting: Family

## 2016-01-07 DIAGNOSIS — I10 Essential (primary) hypertension: Secondary | ICD-10-CM

## 2016-01-07 MED ORDER — HYDROCHLOROTHIAZIDE 12.5 MG PO TABS: 12.5000 mg | ORAL_TABLET | Freq: Every day | ORAL | 1 refills | Status: DC

## 2016-01-07 MED ORDER — LABETALOL HCL 100 MG PO TABS: 100.0000 mg | ORAL_TABLET | Freq: Two times a day (BID) | ORAL | 1 refills | Status: DC

## 2016-01-07 NOTE — Progress Notes (Signed)
Subjective:    Patient ID: Olivia Patel, female    DOB: [birth year], 55 y.o.   MRN: [id number]  Chief Complaint  Patient presents with  . medication follow up    blood pressure    HPI:  Olivia Patel is a 55 y.o. female who  has a past medical history of Anemia; Arthritis; and Hypertension. and presents today for a follow up office visit.  1.) Hypertension - Currently maintained on on hydrochlorothiazide and labetolol. Reports taking the medication as prescribed and denies adverse side effects. Blood pressure at home have been below goal on average. Denies symptoms of end organ damage or worst headache of life. Working on the low sodium diet and is getting ready to start exercising again.    BP Readings from Last 3 Encounters:  01/07/16 134/74  09/17/15 (!) 142/80  09/10/15 140/80    No Known Allergies    Outpatient Medications Prior to Visit  Medication Sig Dispense Refill  . medroxyPROGESTERone (PROVERA) 5 MG tablet Take 1 tab po qd x 5 days every other month if no spontaneous menses 15 tablet 1  . meloxicam (MOBIC) 15 MG tablet Take 1 tablet (15 mg total) by mouth daily. 30 tablet 2  . hydrochlorothiazide (HYDRODIURIL) 12.5 MG tablet Take 1 tablet (12.5 mg total) by mouth daily. 30 tablet 0  . labetalol (NORMODYNE) 100 MG tablet Take 1 tablet (100 mg total) by mouth 2 (two) times daily. 60 tablet 0   No facility-administered medications prior to visit.     Review of Systems  Constitutional: Negative for chills and fever.  Eyes:       Negative for changes in vision  Respiratory: Negative for cough, chest tightness and wheezing.   Cardiovascular: Negative for chest pain, palpitations and leg swelling.  Neurological: Negative for dizziness, weakness and light-headedness.      Objective:    BP 134/74 (BP Location: Left Arm, Patient Position: Sitting, Cuff Size: Large)   Pulse 79   Temp 98 F (36.7 C) (Oral)   Resp 16   Ht '5\' 3"'$  (1.6 m)   Wt 201 lb (91.2  kg)   SpO2 97%   BMI 35.61 kg/m  Nursing note and vital signs reviewed.  Physical Exam  Constitutional: She is oriented to person, place, and time. She appears well-developed and well-nourished. No distress.  Cardiovascular: Normal rate, regular rhythm, normal heart sounds and intact distal pulses.   Pulmonary/Chest: Effort normal and breath sounds normal.  Neurological: She is alert and oriented to person, place, and time.  Skin: Skin is warm and dry.  Psychiatric: She has a normal mood and affect. Her behavior is normal. Judgment and thought content normal.       Assessment & Plan:   Problem List Items Addressed This Visit      Cardiovascular and Mediastinum   Essential hypertension    Hypertension well controlled with current regimen below goal 140/90. No adverse side effects, symptoms of end organ damage, or worse headache of life. Continue current dosage of labetalol and hydrochlorothiazide. Continue to monitor blood pressure at home and follow low-sodium diet. Follow-up in 6 months or sooner if needed.      Relevant Medications   labetalol (NORMODYNE) 100 MG tablet   hydrochlorothiazide (HYDRODIURIL) 12.5 MG tablet    Other Visit Diagnoses   None.      I am having Ms. Brossart maintain her medroxyPROGESTERone, meloxicam, labetalol, and hydrochlorothiazide.   Meds ordered this encounter  Medications  . labetalol (  NORMODYNE) 100 MG tablet    Sig: Take 1 tablet (100 mg total) by mouth 2 (two) times daily.    Dispense:  180 tablet    Refill:  1    Please fill at patient request for refill.    Order Specific Question:   Supervising Provider    Answer:   Pricilla Holm A [[id number]]  . hydrochlorothiazide (HYDRODIURIL) 12.5 MG tablet    Sig: Take 1 tablet (12.5 mg total) by mouth daily.    Dispense:  90 tablet    Refill:  1    Please fill at patient request for refill.    Order Specific Question:   Supervising Provider    Answer:   Pricilla Holm A [[id number]]      Follow-up: Return in about 6 months (around 07/06/2016), or if symptoms worsen or fail to improve.  Mauricio Po, FNP

## 2016-01-07 NOTE — Patient Instructions (Addendum)
Thank you for choosing [facility].  SUMMARY AND INSTRUCTIONS:  Medication:  Please continue to take your medications as prescribed.   Follow a low sodium diet.  Monitor your blood pressures at home.   Your prescription(s) have been submitted to your pharmacy or been printed and provided for you. Please take as directed and contact our office if you believe you are having problem(s) with the medication(s) or have any questions.  Follow up:  If your symptoms worsen or fail to improve, please contact our office for further instruction, or in case of emergency go directly to the emergency room at the closest medical facility.

## 2016-01-07 NOTE — Assessment & Plan Note (Signed)
Hypertension well controlled with current regimen below goal 140/90. No adverse side effects, symptoms of end organ damage, or worse headache of life. Continue current dosage of labetalol and hydrochlorothiazide. Continue to monitor blood pressure at home and follow low-sodium diet. Follow-up in 6 months or sooner if needed.

## 2016-02-17 ENCOUNTER — Encounter: Payer: Self-pay | Admitting: Obstetrics and Gynecology

## 2016-03-03 ENCOUNTER — Other Ambulatory Visit: Payer: Self-pay

## 2016-03-03 DIAGNOSIS — I10 Essential (primary) hypertension: Secondary | ICD-10-CM

## 2016-03-03 DIAGNOSIS — M1990 Unspecified osteoarthritis, unspecified site: Secondary | ICD-10-CM

## 2016-03-03 MED ORDER — LABETALOL HCL 100 MG PO TABS: 100.0000 mg | ORAL_TABLET | Freq: Two times a day (BID) | ORAL | 1 refills | Status: DC

## 2016-03-03 MED ORDER — MELOXICAM 15 MG PO TABS: 15.0000 mg | ORAL_TABLET | Freq: Every day | ORAL | 2 refills | Status: DC

## 2016-03-03 MED ORDER — HYDROCHLOROTHIAZIDE 12.5 MG PO TABS: 12.5000 mg | ORAL_TABLET | Freq: Every day | ORAL | 1 refills | Status: DC

## 2016-03-05 ENCOUNTER — Other Ambulatory Visit: Payer: Self-pay | Admitting: *Deleted

## 2016-03-05 DIAGNOSIS — M1990 Unspecified osteoarthritis, unspecified site: Secondary | ICD-10-CM

## 2016-03-05 DIAGNOSIS — I10 Essential (primary) hypertension: Secondary | ICD-10-CM

## 2016-03-05 MED ORDER — LABETALOL HCL 100 MG PO TABS: 100.0000 mg | ORAL_TABLET | Freq: Two times a day (BID) | ORAL | 1 refills | Status: DC

## 2016-03-05 MED ORDER — MELOXICAM 15 MG PO TABS: 15.0000 mg | ORAL_TABLET | Freq: Every day | ORAL | 1 refills | Status: DC

## 2016-03-05 MED ORDER — HYDROCHLOROTHIAZIDE 12.5 MG PO TABS: 12.5000 mg | ORAL_TABLET | Freq: Every day | ORAL | 1 refills | Status: DC

## 2016-08-11 ENCOUNTER — Ambulatory Visit (INDEPENDENT_AMBULATORY_CARE_PROVIDER_SITE_OTHER): Payer: Medicare Other | Admitting: Family

## 2016-08-11 ENCOUNTER — Encounter: Payer: Self-pay | Admitting: Family

## 2016-08-11 VITALS — BP 154/82 | HR 82 | Temp 98.4°F | Resp 16 | Ht 63.0 in | Wt 203.0 lb

## 2016-08-11 DIAGNOSIS — M7580 Other shoulder lesions, unspecified shoulder: Secondary | ICD-10-CM | POA: Insufficient documentation

## 2016-08-11 DIAGNOSIS — I10 Essential (primary) hypertension: Secondary | ICD-10-CM

## 2016-08-11 DIAGNOSIS — M7581 Other shoulder lesions, right shoulder: Secondary | ICD-10-CM | POA: Diagnosis not present

## 2016-08-11 MED ORDER — PREDNISONE 10 MG (21) PO TBPK: ORAL_TABLET | ORAL | 0 refills | Status: DC

## 2016-08-11 NOTE — Assessment & Plan Note (Signed)
Blood pressure slightly elevated above goal 140/90 secondary to sodium intake. Appears compliant medication regimen with no adverse side effects. Advised to decrease sodium in diet. Continue to monitor blood pressure at home and follow low-sodium diet. Denies worst headache of life with any symptoms of end organ damage noted on physical exam. Continue current dosage of hydrochlorothiazide and labetalol.

## 2016-08-11 NOTE — Patient Instructions (Addendum)
Thank you for choosing [facility].  SUMMARY AND INSTRUCTIONS:  Continue to take your medications as prescribed.  Monitor your blood pressures at home. Follow in  with 2 weeks with blood pressure readings.   Start prednisone for your shoulder  Watch the sodium intake.   Medication:  Your prescription(s) have been submitted to your pharmacy or been printed and provided for you. Please take as directed and contact our office if you believe you are having problem(s) with the medication(s) or have any questions.  Follow up:  If your symptoms worsen or fail to improve, please contact our office for further instruction, or in case of emergency go directly to the emergency room at the closest medical facility.     Cervical Strain and Sprain Rehab Ask your health care provider which exercises are safe for you. Do exercises exactly as told by your health care provider and adjust them as directed. It is normal to feel mild stretching, pulling, tightness, or discomfort as you do these exercises, but you should stop right away if you feel sudden pain or your pain gets worse.Do not begin these exercises until told by your health care provider. Stretching and range of motion exercises These exercises warm up your muscles and joints and improve the movement and flexibility of your neck. These exercises also help to relieve pain, numbness, and tingling. Exercise A: Cervical side bend   1. Using good posture, sit on a stable chair or stand up. 2. Without moving your shoulders, slowly tilt your left / right ear to your shoulder until you feel a stretch in your neck muscles. You should be looking straight ahead. 3. Hold for __________ seconds. 4. Repeat with the other side of your neck. Repeat __________ times. Complete this exercise __________ times a day. Exercise B: Cervical rotation   1. Using good posture, sit on a stable chair or stand up. 2. Slowly turn your head to the side as if you  are looking over your left / right shoulder.  Keep your eyes level with the ground.  Stop when you feel a stretch along the side and the back of your neck. 3. Hold for __________ seconds. 4. Repeat this by turning to your other side. Repeat __________ times. Complete this exercise __________ times a day. Exercise C: Thoracic extension and pectoral stretch  1. Roll a towel or a small blanket so it is about 4 inches (10 cm) in diameter. 2. Lie down on your back on a firm surface. 3. Put the towel lengthwise, under your spine in the middle of your back. It should not be not under your shoulder blades. The towel should line up with your spine from your middle back to your lower back. 4. Put your hands behind your head and let your elbows fall out to your sides. 5. Hold for __________ seconds. Repeat __________ times. Complete this exercise __________ times a day. Strengthening exercises These exercises build strength and endurance in your neck. Endurance is the ability to use your muscles for a long time, even after your muscles get tired. Exercise D: Upper cervical flexion, isometric  1. Lie on your back with a thin pillow behind your head and a small rolled-up towel under your neck. 2. Gently tuck your chin toward your chest and nod your head down to look toward your feet. Do not lift your head off the pillow. 3. Hold for __________ seconds. 4. Release the tension slowly. Relax your neck muscles completely before you repeat this  exercise. Repeat __________ times. Complete this exercise __________ times a day. Exercise E: Cervical extension, isometric   1. Stand about 6 inches (15 cm) away from a wall, with your back facing the wall. 2. Place a soft object, about 6-8 inches (15-20 cm) in diameter, between the back of your head and the wall. A soft object could be a small pillow, a ball, or a folded towel. 3. Gently tilt your head back and press into the soft object. Keep your jaw and forehead  relaxed. 4. Hold for __________ seconds. 5. Release the tension slowly. Relax your neck muscles completely before you repeat this exercise. Repeat __________ times. Complete this exercise __________ times a day. Posture and body mechanics   Body mechanics refers to the movements and positions of your body while you do your daily activities. Posture is part of body mechanics. Good posture and healthy body mechanics can help to relieve stress in your body's tissues and joints. Good posture means that your spine is in its natural S-curve position (your spine is neutral), your shoulders are pulled back slightly, and your head is not tipped forward. The following are general guidelines for applying improved posture and body mechanics to your everyday activities. Standing   When standing, keep your spine neutral and keep your feet about hip-width apart. Keep a slight bend in your knees. Your ears, shoulders, and hips should line up.  When you do a task in which you stand in one place for a long time, place one foot up on a stable object that is 2-4 inches (5-10 cm) high, such as a footstool. This helps keep your spine neutral. Sitting    When sitting, keep your spine neutral and your keep feet flat on the floor. Use a footrest, if necessary, and keep your thighs parallel to the floor. Avoid rounding your shoulders, and avoid tilting your head forward.  When working at a desk or a computer, keep your desk at a height where your hands are slightly lower than your elbows. Slide your chair under your desk so you are close enough to maintain good posture.  When working at a computer, place your monitor at a height where you are looking straight ahead and you do not have to tilt your head forward or downward to look at the screen. Resting  When lying down and resting, avoid positions that are most painful for you. Try to support your neck in a neutral position. You can use a contour pillow or a small  rolled-up towel. Your pillow should support your neck but not push on it. This information is not intended to replace advice given to you by your health care provider. Make sure you discuss any questions you have with your health care provider. Document Released: 04/12/2005 Document Revised: 12/18/2015 Document Reviewed: 03/19/2015 Elsevier Interactive Patient Education  2017 [place].   Shoulder Impingement Syndrome Rehab Ask your health care provider which exercises are safe for you. Do exercises exactly as told by your health care provider and adjust them as directed. It is normal to feel mild stretching, pulling, tightness, or discomfort as you do these exercises, but you should stop right away if you feel sudden pain or your pain gets worse.Do not begin these exercises until told by your health care provider. Stretching and range of motion exercise This exercise warms up your muscles and joints and improves the movement and flexibility of your shoulder. This exercise also helps to relieve pain and stiffness. Exercise A:  Passive horizontal adduction   1. Sit or stand and pull your left / right elbow across your chest, toward your other shoulder. Stop when you feel a gentle stretch in the back of your shoulder and upper arm.  Keep your arm at shoulder height.  Keep your arm as close to your body as you comfortably can. 2. Hold for __________ seconds. 3. Slowly return to the starting position. Repeat __________ times. Complete this exercise __________ times a day. Strengthening exercises These exercises build strength and endurance in your shoulder. Endurance is the ability to use your muscles for a long time, even after they get tired. Exercise B: External rotation, isometric  5. Stand or sit in a doorway, facing the door frame. 6. Bend your left / right elbow and place the back of your wrist against the door frame. Only your wrist should be touching the frame. Keep your upper arm at  your side. 7. Gently press your wrist against the door frame, as if you are trying to push your arm away from your abdomen.  Avoid shrugging your shoulder while you press your hand against the door frame. Keep your shoulder blade tucked down toward the middle of your back. 8. Hold for __________ seconds. 9. Slowly release the tension, and relax your muscles completely before you do the exercise again. Repeat __________ times. Complete this exercise __________ times a day. Exercise C: Internal rotation, isometric   1. Stand or sit in a doorway, facing the door frame. 2. Bend your left / right elbow and place the inside of your wrist against the door frame. Only your wrist should be touching the frame. Keep your upper arm at your side. 3. Gently press your wrist against the door frame, as if you are trying to push your arm toward your abdomen.  Avoid shrugging your shoulder while you press your hand against the door frame. Keep your shoulder blade tucked down toward the middle of your back. 4. Hold for __________ seconds. 5. Slowly release the tension, and relax your muscles completely before you do the exercise again. Repeat __________ times. Complete this exercise __________ times a day. Exercise D: Scapular protraction, supine   5. Lie on your back on a firm surface. Hold a __________ weight in your left / right hand. 6. Raise your left / right arm straight into the air so your hand is directly above your shoulder joint. 7. Push the weight into the air so your shoulder lifts off of the surface that you are lying on. Do not move your head, neck, or back. 8. Hold for __________ seconds. 9. Slowly return to the starting position. Let your muscles relax completely before you repeat this exercise. Repeat __________ times. Complete this exercise __________ times a day. Exercise E: Scapular retraction   6. Sit in a stable chair without armrests, or stand. 7. Secure an exercise band to a stable  object in front of you so the band is at shoulder height. 8. Hold one end of the exercise band in each hand. Your palms should face down. 9. Squeeze your shoulder blades together and move your elbows slightly behind you. Do not shrug your shoulders while you do this. 10. Hold for __________ seconds. 11. Slowly return to the starting position. Repeat __________ times. Complete this exercise __________ times a day. Exercise F: Shoulder extension   1. Sit in a stable chair without armrests, or stand. 2. Secure an exercise band to a stable object in front of you  where the band is above shoulder height. 3. Hold one end of the exercise band in each hand. 4. Straighten your elbows and lift your hands up to shoulder height. 5. Squeeze your shoulder blades together and pull your hands down to the sides of your thighs. Stop when your hands are straight down by your sides. Do not let your hands go behind your body. 6. Hold for __________ seconds. 7. Slowly return to the starting position. Repeat __________ times. Complete this exercise __________ times a day. This information is not intended to replace advice given to you by your health care provider. Make sure you discuss any questions you have with your health care provider. Document Released: 04/12/2005 Document Revised: 12/18/2015 Document Reviewed: 03/15/2015 Elsevier Interactive Patient Education  2017 [employer].

## 2016-08-11 NOTE — Progress Notes (Signed)
Subjective:    Patient ID: Olivia Patel, female    DOB: [birth year], 56 y.o.   MRN: [id number]  Chief Complaint  Patient presents with  . Follow-up    BP     HPI:  Olivia Patel is a 56 y.o. female who  has a past medical history of Anemia; Arthritis; and Hypertension. and presents today for a follow up office visit.   1.) Hypertension - Currently maintained on hydrochlorothiazide and labetalol. Reports taking medication as prescribed and denies adverse side effects. Denies worst headache of life with no new symptoms of end organ damage. Blood pressures at home have been averaging less than 145 at the highest. Endorses consuming a significant amount of sodium over the past 24 hours.   BP Readings from Last 3 Encounters:  08/11/16 (!) 154/82  01/07/16 134/74  09/17/15 (!) 142/80    2.) Right arm - This is a new problem. Associated symptom of pain and numbness and tingling located in her right upper extremity has been going on for a couple of weeks. Denies any trauma or injury. Pain is described and achy. Severity is enough to have her hands go numb at night and does effect her ability to work at times. Modifying factors include Mobic which does help a little. No neck pain.   No Known Allergies    Outpatient Medications Prior to Visit  Medication Sig Dispense Refill  . hydrochlorothiazide (HYDRODIURIL) 12.5 MG tablet Take 1 tablet (12.5 mg total) by mouth daily. 90 tablet 1  . labetalol (NORMODYNE) 100 MG tablet Take 1 tablet (100 mg total) by mouth 2 (two) times daily. 180 tablet 1  . medroxyPROGESTERone (PROVERA) 5 MG tablet Take 1 tab po qd x 5 days every other month if no spontaneous menses 15 tablet 1  . meloxicam (MOBIC) 15 MG tablet Take 1 tablet (15 mg total) by mouth daily. 90 tablet 1   No facility-administered medications prior to visit.     Review of Systems  Constitutional: Negative for chills and fever.  Eyes:       Negative for changes in vision    Respiratory: Negative for cough, chest tightness and wheezing.   Cardiovascular: Negative for chest pain, palpitations and leg swelling.  Neurological: Negative for dizziness, weakness and light-headedness.      Objective:    BP (!) 154/82 (BP Location: Left Arm, Patient Position: Sitting, Cuff Size: Large)   Pulse 82   Temp 98.4 F (36.9 C) (Oral)   Resp 16   Ht '5\' 3"'$  (1.6 m)   Wt 203 lb (92.1 kg)   SpO2 98%   BMI 35.96 kg/m  Nursing note and vital signs reviewed.  Physical Exam  Constitutional: She is oriented to person, place, and time. She appears well-developed and well-nourished. No distress.  Cardiovascular: Normal rate, regular rhythm, normal heart sounds and intact distal pulses.   Pulmonary/Chest: Effort normal and breath sounds normal.  Musculoskeletal:  Right shoulder - no obvious deformity, discoloration, or edema. Tenderness noted over subacromial space and upper trapezius muscle group. Range of motion appears within normal limits. Strength is decreased on the right side in abduction. Distal pulses and sensation are intact and appropriate. Positive empty can; negative Neer's impingement; negative Michel Bickers  Neurological: She is alert and oriented to person, place, and time.  Skin: Skin is warm and dry.  Psychiatric: She has a normal mood and affect. Her behavior is normal. Judgment and thought content normal.  Assessment & Plan:   Problem List Items Addressed This Visit      Cardiovascular and Mediastinum   Essential hypertension - Primary    Blood pressure slightly elevated above goal 140/90 secondary to sodium intake. Appears compliant medication regimen with no adverse side effects. Advised to decrease sodium in diet. Continue to monitor blood pressure at home and follow low-sodium diet. Denies worst headache of life with any symptoms of end organ damage noted on physical exam. Continue current dosage of hydrochlorothiazide and labetalol.         Musculoskeletal and Integument   Rotator cuff tendinitis    New onset symptoms and exam consistent with rotator cuff tendinitis and possible cervical radiculopathy given upper extremity numbness/tingling. Continue current dosage of Mobic. Start prednisone taper. Recommend ice and home exercise therapy. Follow-up if symptoms worsen or do not improve.      Relevant Medications   predniSONE (STERAPRED UNI-PAK 21 TAB) 10 MG (21) TBPK tablet       I am having Ms. Basford start on predniSONE. I am also having her maintain her medroxyPROGESTERone, hydrochlorothiazide, labetalol, and meloxicam.   Meds ordered this encounter  Medications  . predniSONE (STERAPRED UNI-PAK 21 TAB) 10 MG (21) TBPK tablet    Sig: Take 6 tablets x 1 day, 5 tablets x 1 day, 4 tablets x 1 day, 3 tablets x 1 day, 2 tablets x 1 day, 1 tablet x 1 day    Dispense:  21 tablet    Refill:  0    Order Specific Question:   Supervising Provider    Answer:   Pricilla Holm A [[id number]]     Follow-up: Return in about 6 months (around 02/10/2017), or if symptoms worsen or fail to improve.  Mauricio Po, FNP

## 2016-08-11 NOTE — Assessment & Plan Note (Signed)
New onset symptoms and exam consistent with rotator cuff tendinitis and possible cervical radiculopathy given upper extremity numbness/tingling. Continue current dosage of Mobic. Start prednisone taper. Recommend ice and home exercise therapy. Follow-up if symptoms worsen or do not improve.

## 2016-08-25 ENCOUNTER — Encounter: Payer: Self-pay | Admitting: Family

## 2016-09-03 DIAGNOSIS — Z0289 Encounter for other administrative examinations: Secondary | ICD-10-CM

## 2016-09-13 ENCOUNTER — Other Ambulatory Visit: Payer: Self-pay | Admitting: Family

## 2016-09-13 DIAGNOSIS — M1990 Unspecified osteoarthritis, unspecified site: Secondary | ICD-10-CM

## 2016-09-15 ENCOUNTER — Ambulatory Visit: Payer: Medicare Other | Admitting: Obstetrics and Gynecology

## 2016-11-11 NOTE — Progress Notes (Addendum)
Subjective:   Olivia Patel is a 56 y.o. female who presents for an Initial Medicare Annual Wellness Visit.  Review of Systems    No ROS.  Medicare Wellness Visit. Additional risk factors are reflected in the social history.  Cardiac Risk Factors include: hypertension;obesity (BMI >30kg/m2);sedentary lifestyle Sleep patterns: feels rested on waking, gets up 1-2 times nightly to void and sleeps 6-8 hours nightly.    Home Safety/Smoke Alarms: Feels safe in home. Smoke alarms in place.  Living environment; residence and Firearm Safety: apartment, no firearms. Lives with family, no needs for DME, good support system  Seat Belt Safety/Bike Helmet: Wears seat belt.   Counseling:   Eye Exam- appointment yearly Dental- Resources provided  Female:   Pap- Last 09/10/15      Mammo-  Last 10/08/15, inconclusive, declined referral, stating she will call and schedule an appointment Dexa scan- N/D        CCS- N/D, declined referral, stating she will call and schedule an appointment     Objective:    Today's Vitals   11/12/16 1105 11/12/16 1110  BP: (!) 144/88   Pulse: 87   Resp: 20   SpO2: 100%   Weight: 203 lb (92.1 kg)   Height: 5\' 3"  (1.6 m)   PainSc:  2    Body mass index is 35.96 kg/m.   Current Medications (verified) Outpatient Encounter Prescriptions as of 11/12/2016  Medication Sig  . meloxicam (MOBIC) 15 MG tablet TAKE 1 TABLET BY MOUTH  DAILY  . hydrochlorothiazide (HYDRODIURIL) 12.5 MG tablet Take 1 tablet (12.5 mg total) by mouth daily.  Marland Kitchen labetalol (NORMODYNE) 100 MG tablet Take 1 tablet (100 mg total) by mouth 2 (two) times daily.  . [DISCONTINUED] medroxyPROGESTERone (PROVERA) 5 MG tablet Take 1 tab po qd x 5 days every other month if no spontaneous menses (Patient not taking: Reported on 11/12/2016)  . [DISCONTINUED] predniSONE (STERAPRED UNI-PAK 21 TAB) 10 MG (21) TBPK tablet Take 6 tablets x 1 day, 5 tablets x 1 day, 4 tablets x 1 day, 3 tablets x 1 day, 2  tablets x 1 day, 1 tablet x 1 day (Patient not taking: Reported on 11/12/2016)   No facility-administered encounter medications on file as of 11/12/2016.     Allergies (verified) Patient has no known allergies.   History: Past Medical History:  Diagnosis Date  . Anemia   . Arthritis   . Hypertension    Past Surgical History:  Procedure Laterality Date  . TUBAL LIGATION     Family History  Problem Relation Age of Onset  . Hypertension Mother   . Diabetes Mother   . Diabetes Sister   . Hypertension Father   . Kidney failure Father    Social History   Occupational History  . Disability    Social History Main Topics  . Smoking status: Never Smoker  . Smokeless tobacco: Never Used  . Alcohol use No  . Drug use: No  . Sexual activity: No    Tobacco Counseling Counseling given: Not Answered   Activities of Daily Living In your present state of health, do you have any difficulty performing the following activities: 11/12/2016  Hearing? N  Vision? N  Difficulty concentrating or making decisions? N  Walking or climbing stairs? N  Dressing or bathing? N  Doing errands, shopping? N  Preparing Food and eating ? N  Using the Toilet? N  In the past six months, have you accidently leaked urine? N  Do  you have problems with loss of bowel control? N  Managing your Medications? N  Managing your Finances? N  Housekeeping or managing your Housekeeping? N  Some recent data might be hidden    Immunizations and Health Maintenance Immunization History  Administered Date(s) Administered  . Tdap 09/01/2015   Health Maintenance Due  Topic Date Due  . COLONOSCOPY  12/13/2012    Patient Care Team: Golden Circle, FNP as PCP - General (Family Medicine)  Indicate any recent Medical Services you may have received from other than [facility] providers in the past year (date may be approximate).     Assessment:   This is a routine wellness examination for Grandin.Physical  assessment deferred to PCP.   Hearing/Vision screen Hearing Screening Comments: Able to hear conversational tones w/o difficulty. No issues reported.  Passed whisper test Vision Screening Comments: Wears glasses, reports yearly appointments with [facility] [place] issues and exercise activities discussed: Current Exercise Habits: The patient does not participate in regular exercise at present (chair exercise pamphlets provided, patient states she wants to restart water aerobics), Exercise limited by: orthopedic condition(s)  Diet (meal preparation, eat out, water intake, caffeinated beverages, dairy products, fruits and vegetables): in general, a "healthy" diet  , well balanced, low fat/ cholesterol, low salt, eats a variety of fruits and vegetables daily, limits salt, fat/cholesterol, sugar, caffeine, drinks 6-8 glasses of water daily.   Goals    . lose weight to be healthy and help with blood pressure          Start water aerobics, chair exercises, eat healthy, enjoy life and family.      Depression Screen PHQ 2/9 Scores 11/12/2016  PHQ - 2 Score 0    Fall Risk Fall Risk  11/12/2016  Falls in the past year? No    Cognitive Function:       Ad8 score reviewed for issues:  Issues making decisions: no  Less interest in hobbies / activities: no  Repeats questions, stories (family complaining): no  Trouble using ordinary gadgets (microwave, computer, phone):no  Forgets the month or year: no  Mismanaging finances: no  Remembering appts: no  Daily problems with thinking and/or memory: no Ad8 score is= 0  Screening Tests Health Maintenance  Topic Date Due  . COLONOSCOPY  12/13/2012  . INFLUENZA VACCINE  01/08/2017 (Originally 11/24/2016)  . Hepatitis C Screening  11/12/2017 (Originally 1961/03/08)  . HIV Screening  11/12/2017 (Originally 12/13/1977)  . MAMMOGRAM  10/07/2017  . PAP SMEAR  09/10/2018  . TETANUS/TDAP  08/31/2025      Plan:     Continue  doing brain stimulating activities (puzzles, reading, adult coloring books, staying active) to keep memory sharp.   Continue to eat heart healthy diet (full of fruits, vegetables, whole grains, lean protein, water--limit salt, fat, and sugar intake) and increase physical activity as tolerated.  I have personally reviewed and noted the following in the patient's chart:   . Medical and social history . Use of alcohol, tobacco or illicit drugs  . Current medications and supplements . Functional ability and status . Nutritional status . Physical activity . Advanced directives . List of other physicians . Vitals . Screenings to include cognitive, depression, and falls . Referrals and appointments  In addition, I have reviewed and discussed with patient certain preventive protocols, quality metrics, and best practice recommendations. A written personalized care plan for preventive services as well as general preventive health recommendations were provided to patient.  Michiel Cowboy, RN   11/12/2016    Medical screening examination/treatment/procedure(s) were performed by non-physician practitioner and as supervising provider I was immediately available for consultation/collaboration. I agree with treatment plan. Mauricio Po, FNP

## 2016-11-11 NOTE — Progress Notes (Signed)
Pre visit review using our clinic review tool, if applicable. No additional management support is needed unless otherwise documented below in the visit note. 

## 2016-11-12 ENCOUNTER — Ambulatory Visit (INDEPENDENT_AMBULATORY_CARE_PROVIDER_SITE_OTHER): Payer: Medicare Other | Admitting: *Deleted

## 2016-11-12 VITALS — BP 144/88 | HR 87 | Resp 20 | Ht 63.0 in | Wt 203.0 lb

## 2016-11-12 DIAGNOSIS — Z0000 Encounter for general adult medical examination without abnormal findings: Secondary | ICD-10-CM | POA: Diagnosis not present

## 2016-11-12 NOTE — Patient Instructions (Signed)
Continue doing brain stimulating activities (puzzles, reading, adult coloring books, staying active) to keep memory sharp.   Continue to eat heart healthy diet (full of fruits, vegetables, whole grains, lean protein, water--limit salt, fat, and sugar intake) and increase physical activity as tolerated.   Olivia Patel , Thank you for taking time to come for your Medicare Wellness Visit. I appreciate your ongoing commitment to your health goals. Please review the following plan we discussed and let me know if I can assist you in the future.   These are the goals we discussed: Goals    . lose weight to be healthy and help with blood pressure          Start water aerobics, chair exercises, eat healthy, enjoy life and family.       This is a list of the screening recommended for you and due dates:  Health Maintenance  Topic Date Due  .  Hepatitis C: One time screening is recommended by Center for Disease Control  (CDC) for  adults born from [birth year] through 1963.   19-Jan-1961  . HIV Screening  12/13/1977  . Colon Cancer Screening  12/13/2012  . Flu Shot  01/08/2017*  . Mammogram  10/07/2017  . Pap Smear  09/10/2018  . Tetanus Vaccine  08/31/2025  *Topic was postponed. The date shown is not the original due date.

## 2016-12-08 ENCOUNTER — Other Ambulatory Visit: Payer: Self-pay | Admitting: Family

## 2016-12-08 DIAGNOSIS — I10 Essential (primary) hypertension: Secondary | ICD-10-CM

## 2016-12-08 DIAGNOSIS — M1990 Unspecified osteoarthritis, unspecified site: Secondary | ICD-10-CM

## 2017-03-14 ENCOUNTER — Other Ambulatory Visit: Payer: Self-pay | Admitting: *Deleted

## 2017-03-14 DIAGNOSIS — M1990 Unspecified osteoarthritis, unspecified site: Secondary | ICD-10-CM

## 2017-03-14 DIAGNOSIS — I10 Essential (primary) hypertension: Secondary | ICD-10-CM

## 2017-04-11 ENCOUNTER — Telehealth: Payer: Self-pay | Admitting: Family

## 2017-04-11 ENCOUNTER — Ambulatory Visit: Payer: Medicare Other | Admitting: Nurse Practitioner

## 2017-04-11 DIAGNOSIS — I10 Essential (primary) hypertension: Secondary | ICD-10-CM

## 2017-04-11 MED ORDER — LABETALOL HCL 100 MG PO TABS: 100.0000 mg | ORAL_TABLET | Freq: Two times a day (BID) | ORAL | 0 refills | Status: DC

## 2017-04-11 NOTE — Telephone Encounter (Signed)
Copied from [place] #[id number]. Topic: Quick Communication - Appointment Cancellation >> Apr 11, 2017  7:50 AM Scherrie Gerlach wrote: Patient called to cancel appointment scheduled for today at 9 am . Patient has rescheduled their appointment to first available which is 06/06/2017. Pt states she doesn't have a ride today.  However, pt will be out of her labetalol (NORMODYNE) 100 MG tablet next week. Would like a refill to get her through, unless pt can be worked in sooner.  [place], [place] [phone] (Phone) [phone] (Fax)   Please advise on refill

## 2017-04-11 NOTE — Telephone Encounter (Signed)
Sent 30 day script to local pharmacy until appt...Johny Chess

## 2017-04-11 NOTE — Addendum Note (Signed)
Addended by: Earnstine Regal on: 04/11/2017 11:33 AM   Modules accepted: Orders

## 2017-05-04 ENCOUNTER — Telehealth: Payer: Self-pay | Admitting: Nurse Practitioner

## 2017-05-04 DIAGNOSIS — M1990 Unspecified osteoarthritis, unspecified site: Secondary | ICD-10-CM

## 2017-05-04 DIAGNOSIS — I10 Essential (primary) hypertension: Secondary | ICD-10-CM

## 2017-05-04 MED ORDER — LABETALOL HCL 100 MG PO TABS: 100.0000 mg | ORAL_TABLET | Freq: Two times a day (BID) | ORAL | 0 refills | Status: DC

## 2017-05-04 MED ORDER — HYDROCHLOROTHIAZIDE 12.5 MG PO TABS: 12.5000 mg | ORAL_TABLET | Freq: Every day | ORAL | 0 refills | Status: DC

## 2017-05-04 MED ORDER — MELOXICAM 15 MG PO TABS: 15.0000 mg | ORAL_TABLET | Freq: Every day | ORAL | 0 refills | Status: DC

## 2017-05-04 NOTE — Telephone Encounter (Signed)
Called pt back inform her can only send 30 day to local pharmacy until she see her new provider, and then they will send 90 day to [facility]. Verified pt local pharmacy sent scripts to [facility] on [address]...Olivia Patel

## 2017-05-04 NOTE — Telephone Encounter (Signed)
Copied from [place]. Topic: Quick Communication - See Telephone Encounter >> May 04, 2017  8:19 AM Ether Griffins B wrote: CRM for notification. See Telephone encounter for:  Pt needing refill on labetalol, meloxicam, and hydrochlorothiazide. Pt is set up to see [facility] on 06/06/17. [facility] is the pharmacy.  05/04/17.

## 2017-06-06 ENCOUNTER — Other Ambulatory Visit (INDEPENDENT_AMBULATORY_CARE_PROVIDER_SITE_OTHER): Payer: Medicare Other

## 2017-06-06 ENCOUNTER — Encounter: Payer: Self-pay | Admitting: Nurse Practitioner

## 2017-06-06 ENCOUNTER — Ambulatory Visit (INDEPENDENT_AMBULATORY_CARE_PROVIDER_SITE_OTHER): Payer: Medicare Other | Admitting: Nurse Practitioner

## 2017-06-06 VITALS — BP 162/90 | HR 81 | Temp 98.7°F | Resp 16 | Ht 63.0 in | Wt 207.0 lb

## 2017-06-06 DIAGNOSIS — M1990 Unspecified osteoarthritis, unspecified site: Secondary | ICD-10-CM

## 2017-06-06 DIAGNOSIS — Z5181 Encounter for therapeutic drug level monitoring: Secondary | ICD-10-CM | POA: Diagnosis not present

## 2017-06-06 DIAGNOSIS — I10 Essential (primary) hypertension: Secondary | ICD-10-CM

## 2017-06-06 LAB — CBC WITH DIFFERENTIAL/PLATELET
BASOS ABS: 28 {cells}/uL (ref 0–200)
Basophils Relative: 0.4 %
EOS PCT: 2.3 %
Eosinophils Absolute: 159 cells/uL (ref 15–500)
HEMATOCRIT: 37.4 % (ref 35.0–45.0)
Hemoglobin: 12.8 g/dL (ref 11.7–15.5)
LYMPHS ABS: 2001 {cells}/uL (ref 850–3900)
MCH: 30 pg (ref 27.0–33.0)
MCHC: 34.2 g/dL (ref 32.0–36.0)
MCV: 87.8 fL (ref 80.0–100.0)
MPV: 10.6 fL (ref 7.5–12.5)
Monocytes Relative: 9.2 %
NEUTROS PCT: 59.1 %
Neutro Abs: 4078 cells/uL (ref 1500–7800)
Platelets: 351 10*3/uL (ref 140–400)
RBC: 4.26 10*6/uL (ref 3.80–5.10)
RDW: 11.8 % (ref 11.0–15.0)
TOTAL LYMPHOCYTE: 29 %
WBC mixed population: 635 cells/uL (ref 200–950)
WBC: 6.9 10*3/uL (ref 3.8–10.8)

## 2017-06-06 LAB — COMPREHENSIVE METABOLIC PANEL
ALT: 18 U/L (ref 0–35)
AST: 18 U/L (ref 0–37)
Albumin: 4.5 g/dL (ref 3.5–5.2)
Alkaline Phosphatase: 74 U/L (ref 39–117)
BUN: 13 mg/dL (ref 6–23)
CALCIUM: 9.8 mg/dL (ref 8.4–10.5)
CHLORIDE: 101 meq/L (ref 96–112)
CO2: 29 meq/L (ref 19–32)
Creatinine, Ser: 1 mg/dL (ref 0.40–1.20)
GFR: 74.17 mL/min (ref >60.00)
Glucose, Bld: 89 mg/dL (ref 70–99)
Potassium: 4.3 mEq/L (ref 3.5–5.1)
Sodium: 139 mEq/L (ref 135–145)
Total Bilirubin: 0.4 mg/dL (ref 0.2–1.2)
Total Protein: 8.3 g/dL (ref 6.0–8.3)

## 2017-06-06 MED ORDER — LABETALOL HCL 100 MG PO TABS: 100.0000 mg | ORAL_TABLET | Freq: Two times a day (BID) | ORAL | 2 refills | Status: DC

## 2017-06-06 NOTE — Assessment & Plan Note (Signed)
BP Elevated today, she missed past 2 days of medications Discussed the role of healthy diet and exercise and daily medication compliance in the management of hypertension -See AVS for education provided to patient - labetalol (NORMODYNE) 100 MG tablet; Take 1 tablet (100 mg total) by mouth 2 (two) times daily. Must keep appt w/new provider for refills  Dispense: 180 tablet; Refill: 2 - Comprehensive metabolic panel; Future She was instructed to resume medications and RTC in about 2-3 weeks for BP follow up

## 2017-06-06 NOTE — Assessment & Plan Note (Signed)
Maintained on mobic and tylenol prn. We discussed risks of mobic use including cardiovascular events and GI bleeding and safer alternative for pain being tylenol She will trial just tylenol for a few days at home to see if this improves her pain-instructed to take no more than 3grams of tylenol per day Will give handicap placard for 6 months- will reassess how she is doing in 6 months

## 2017-06-06 NOTE — Patient Instructions (Addendum)
Please head downstairs for lab work.  Please resume your blood pressure medications as prescribed. Please try to check your blood pressure once daily or at least a few times a week, at the same time each day, and keep a log. Please return in about 2-3 weeks, so we can recheck your blood pressure and see how you are doing back on your medications.  I have given you 6 months handicap placard. Please keep up the good work on your daily exercise to improve your arthritis.   It was nice to meet you. Thanks for letting me take care of you today :)   Mediterranean Diet A Mediterranean diet refers to food and lifestyle choices that are based on the traditions of countries located on the [employer]. This way of eating has been shown to help prevent certain conditions and improve outcomes for people who have chronic diseases, like kidney disease and heart disease. What are tips for following this plan? Lifestyle  Cook and eat meals together with your family, when possible.  Drink enough fluid to keep your urine clear or pale yellow.  Be physically active every day. This includes: ? Aerobic exercise like running or swimming. ? Leisure activities like gardening, walking, or housework.  Get 7-8 hours of sleep each night.  If recommended by your health care provider, drink red wine in moderation. This means 1 glass a day for nonpregnant women and 2 glasses a day for men. A glass of wine equals 5 oz (150 mL). Reading food labels  Check the serving size of packaged foods. For foods such as rice and pasta, the serving size refers to the amount of cooked product, not dry.  Check the total fat in packaged foods. Avoid foods that have saturated fat or trans fats.  Check the ingredients list for added sugars, such as corn syrup. Shopping  At the grocery store, buy most of your food from the areas near the walls of the store. This includes: ? Fresh fruits and vegetables (produce). ? Grains,  beans, nuts, and seeds. Some of these may be available in unpackaged forms or large amounts (in bulk). ? Fresh seafood. ? Poultry and eggs. ? Low-fat dairy products.  Buy whole ingredients instead of prepackaged foods.  Buy fresh fruits and vegetables in-season from local farmers markets.  Buy frozen fruits and vegetables in resealable bags.  If you do not have access to quality fresh seafood, buy precooked frozen shrimp or canned fish, such as tuna, salmon, or sardines.  Buy small amounts of raw or cooked vegetables, salads, or olives from the deli or salad bar at your store.  Stock your pantry so you always have certain foods on hand, such as olive oil, canned tuna, canned tomatoes, rice, pasta, and beans. Cooking  Cook foods with extra-virgin olive oil instead of using butter or other vegetable oils.  Have meat as a side dish, and have vegetables or grains as your main dish. This means having meat in small portions or adding small amounts of meat to foods like pasta or stew.  Use beans or vegetables instead of meat in common dishes like chili or lasagna.  Experiment with different cooking methods. Try roasting or broiling vegetables instead of steaming or sauteing them.  Add frozen vegetables to soups, stews, pasta, or rice.  Add nuts or seeds for added healthy fat at each meal. You can add these to yogurt, salads, or vegetable dishes.  Marinate fish or vegetables using olive oil, lemon juice,  garlic, and fresh herbs. Meal planning  Plan to eat 1 vegetarian meal one day each week. Try to work up to 2 vegetarian meals, if possible.  Eat seafood 2 or more times a week.  Have healthy snacks readily available, such as: ? Vegetable sticks with hummus. ? [place] yogurt. ? Fruit and nut trail mix.  Eat balanced meals throughout the week. This includes: ? Fruit: 2-3 servings a day ? Vegetables: 4-5 servings a day ? Low-fat dairy: 2 servings a day ? Fish, poultry, or lean meat:  1 serving a day ? Beans and legumes: 2 or more servings a week ? Nuts and seeds: 1-2 servings a day ? Whole grains: 6-8 servings a day ? Extra-virgin olive oil: 3-4 servings a day  Limit red meat and sweets to only a few servings a month What are my food choices?  Mediterranean diet ? Recommended ? Grains: Whole-grain pasta. Brown rice. Bulgar wheat. Polenta. Couscous. Whole-wheat bread. [place]. ? Vegetables: Artichokes. Beets. Broccoli. Cabbage. Carrots. Eggplant. Green beans. Chard. Kale. Spinach. Onions. Leeks. Peas. Squash. Tomatoes. Peppers. Radishes. ? Fruits: Apples. Apricots. Avocado. Berries. Bananas. Cherries. Dates. Figs. Grapes. Lemons. Melon. Oranges. Peaches. Plums. Pomegranate. ? Meats and other protein foods: Beans. Almonds. Sunflower seeds. Pine nuts. Peanuts. [place]. Salmon. Scallops. Shrimp. [place]. Tilapia. Clams. Oysters. Eggs. ? Dairy: Low-fat milk. Cheese. Greek yogurt. ? Beverages: Water. Red wine. Herbal tea. ? Fats and oils: Extra virgin olive oil. Avocado oil. Grape seed oil. ? Sweets and desserts: [place] yogurt with honey. Baked apples. Poached pears. Trail mix. ? Seasoning and other foods: Basil. Cilantro. Coriander. Cumin. Mint. Parsley. Sage. Rosemary. Tarragon. Garlic. Oregano. Thyme. Pepper. Balsalmic vinegar. Tahini. Hummus. Tomato sauce. Olives. Mushrooms. ? Limit these ? Grains: Prepackaged pasta or rice dishes. Prepackaged cereal with added sugar. ? Vegetables: Deep fried potatoes (french fries). ? Fruits: Fruit canned in syrup. ? Meats and other protein foods: Beef. Pork. Lamb. Poultry with skin. Hot dogs. [place] Salines. ? Dairy: Ice cream. Sour cream. Whole milk. ? Beverages: Juice. Sugar-sweetened soft drinks. Beer. Liquor and spirits. ? Fats and oils: Butter. Canola oil. Vegetable oil. Beef fat (tallow). Lard. ? Sweets and desserts: Cookies. Cakes. Pies. Candy. ? Seasoning and other foods: Mayonnaise. Premade sauces and marinades. ? The items listed  may not be a complete list. Talk with your dietitian about what dietary choices are right for you. Summary  The Mediterranean diet includes both food and lifestyle choices.  Eat a variety of fresh fruits and vegetables, beans, nuts, seeds, and whole grains.  Limit the amount of red meat and sweets that you eat.  Talk with your health care provider about whether it is safe for you to drink red wine in moderation. This means 1 glass a day for nonpregnant women and 2 glasses a day for men. A glass of wine equals 5 oz (150 mL). This information is not intended to replace advice given to you by your health care provider. Make sure you discuss any questions you have with your health care provider. Document Released: 12/04/2015 Document Revised: 01/06/2016 Document Reviewed: 12/04/2015 Elsevier Interactive Patient Education  [employer].

## 2017-06-06 NOTE — Progress Notes (Signed)
Name: Olivia Patel   MRN: [id number]    DOB: 27-May-1960   Date:06/06/2017       Progress Note  Subjective  Chief Complaint  Chief Complaint  Patient presents with  . Establish Care    handicap placard and refill of BP med    HPI Olivia Patel is a 57 yo female establishing care with me today. She is requesting a refill of her labetolol and would like her handicap placard.  Hypertension -maintained on hydrochlorothiazide 12.5 once daily and labetolol 100 BID  She did skip doses of her blood pressure medications yesterday and today because she ran out of refills before she could make it to her appointment today. Reports normally she takes both medications daily without adverse medication effects. She does check her BP routinely at home - recent readings have been 120s-130s/80s. Denies headaches, vision changes, chest pain, palpitations, shortness of breath, edema. She overall feels well.  BP Readings from Last 3 Encounters:  06/06/17 (!) 162/90  11/12/16 (!) 144/88  08/11/16 (!) 154/82   Arthritis- This is a chronic problem. She c/o daily aching, arthritic pain in her hands, knees, back and shoulders Her pain is made worse by long periods of standing or walking. She says she was diagnosed with arthritis after an extensive workup by orthopedics and is currently on disability for her arthritis. She is maintained on mobic 15mg  once daily and tylenol prn at bedtime for her pain. She has been working on exercise to improve her pain - going to pool and walking. She is requesting disability parking placard today   Patient Active Problem List   Diagnosis Date Noted  . Rotator cuff tendinitis 08/11/2016  . Essential hypertension 09/17/2015  . Arthritis 10/09/2012    Past Surgical History:  Procedure Laterality Date  . TUBAL LIGATION      Family History  Problem Relation Age of Onset  . Hypertension Mother   . Diabetes Mother   . Diabetes Sister   . Hypertension Father   .  Kidney failure Father     Social History   Socioeconomic History  . Marital status: Single    Spouse name: Not on file  . Number of children: 6  . Years of education: 56  . Highest education level: Not on file  Social Needs  . Financial resource strain: Not on file  . Food insecurity - worry: Not on file  . Food insecurity - inability: Not on file  . Transportation needs - medical: Not on file  . Transportation needs - non-medical: Not on file  Occupational History  . Occupation: Disability  Tobacco Use  . Smoking status: Never Smoker  . Smokeless tobacco: Never Used  Substance and Sexual Activity  . Alcohol use: No    Alcohol/week: 0.0 oz  . Drug use: No  . Sexual activity: No    Partners: Male  Other Topics Concern  . Not on file  Social History Narrative  . Not on file     Current Outpatient Medications:  .  hydrochlorothiazide (HYDRODIURIL) 12.5 MG tablet, Take 1 tablet (12.5 mg total) by mouth daily. Must keep appt w/new provider for future refills, Disp: 30 tablet, Rfl: 0 .  labetalol (NORMODYNE) 100 MG tablet, Take 1 tablet (100 mg total) by mouth 2 (two) times daily. Must keep appt w/new provider for refills, Disp: 60 tablet, Rfl: 0 .  meloxicam (MOBIC) 15 MG tablet, Take 1 tablet (15 mg total) by mouth daily. Must keep appt w/new provider  for future refills, Disp: 30 tablet, Rfl: 0  No Known Allergies   ROS See HPI  Objective  Vitals:   06/06/17 1039  BP: (!) 162/90  Pulse: 81  Resp: 16  Temp: 98.7 F (37.1 C)  TempSrc: Oral  SpO2: 98%  Weight: 207 lb (93.9 kg)  Height: 5\' 3"  (1.6 m)    Body mass index is 36.67 kg/m.  Physical Exam Vital signs reviewed Constitutional: Patient appears well-developed and well-nourished. No distress.  HENT: Head: Normocephalic and atraumatic. Nose: Nose normal. Mouth/Throat: Oropharynx is clear and moist. No oropharyngeal exudate.  Eyes: Conjunctivae are normal. No scleral icterus.  Neck: Normal range of  motion. Neck supple. Cardiovascular: Normal rate, regular rhythm and normal heart sounds.  No murmur heard. No BLE edema. Distal pulses intact. Pulmonary/Chest: Effort normal and breath sounds normal. No respiratory distress. Abdominal: Soft. No distension. Musculoskeletal: Normal range of motion, no joint effusions. No gross deformities Neurological: She is alert and oriented to person, place, and time. Coordination, balance, strength, speech and gait are normal.  Skin: Skin is warm and dry. No rash noted. No erythema.  Psychiatric: Patient has a normal mood and affect. behavior is normal. Judgment and thought content normal.  Assessment & Plan RTC in about 2 weeks for follow up of blood pressure, arthritis treatment   Medication monitoring encounter - Comprehensive metabolic panel; Future - CBC with Differential/Platelet; Future

## 2017-07-07 ENCOUNTER — Encounter: Payer: Self-pay | Admitting: Nurse Practitioner

## 2017-07-07 ENCOUNTER — Ambulatory Visit (INDEPENDENT_AMBULATORY_CARE_PROVIDER_SITE_OTHER): Payer: Medicare Other | Admitting: Nurse Practitioner

## 2017-07-07 DIAGNOSIS — I10 Essential (primary) hypertension: Secondary | ICD-10-CM | POA: Diagnosis not present

## 2017-07-07 MED ORDER — HYDROCHLOROTHIAZIDE 25 MG PO TABS: 25.0000 mg | ORAL_TABLET | Freq: Every day | ORAL | 1 refills | Status: DC

## 2017-07-07 NOTE — Patient Instructions (Addendum)
Please increase your HCTZ to 25 mg once daily. Continue your labetolol as you have been taking.  Return in about 2 weeks so we can see how your blood pressure is doing. Bring your home monitor and we will make sure it is accurate  Continue to work on healthy diet and exercise.  It was good to see you.Thanks for letting me take care of you today :)

## 2017-07-07 NOTE — Assessment & Plan Note (Signed)
BP is quite elevated on 2 readings today. She feels well and says her readings are normal at home, but due to how elevated her blood pressure is today I recommended adjusting medications and she is agreeable. We will increase HCTZ to 25 daily and she will continue labetolol at current dosage. We again discussed using tylenol for pain rather than NSAIDS as her daily NSAID use could be contributing to her elevated BP- this was inintially discussed at last OV for arthritis pain due to long term risks of daily mobic use. She actually says she did stop the mobic but started taking advil instead- we spend time discussing brand and generic names of common NSAIDs and she will try tylenol/acetaminophen instead. We also Discussed the role of healthy diet and exercise in the management of hypertension RTC in 2 weeks for follow up. - hydrochlorothiazide (HYDRODIURIL) 25 MG tablet; Take 1 tablet (25 mg total) by mouth daily.  Dispense: 30 tablet; Refill: 1

## 2017-07-07 NOTE — Progress Notes (Signed)
Name: Olivia Patel   MRN: [id number]    DOB: [birth year]   Date:07/07/2017       Progress Note  Subjective  Chief Complaint  Chief Complaint  Patient presents with  . Follow-up    Blood pressure    HPI Returns today for a blood pressure follow up.  Hypertension -maintained on labetolol 100 BID, hydrochlorothiazide 12.5 once daily At her last OV her blood pressure was elevated but she had not been taking her medications daily due to running out of medications.  Reports she has resumed her medications daily and has been tolerating them well without noted adverse medication effects. She checks her blood pressure regularly at home and her readings have been mostly normal. She did take both of her BP medications this am. She says she does not feel anxious about her appointment. She has actually been trying to eat a healthy diet and walking daily Denies headaches, vision changes, chest pain, shortness of breath, edema.  BP Readings from Last 3 Encounters:  07/07/17 (!) 170/100  06/06/17 (!) 162/90  11/12/16 (!) 144/88     Patient Active Problem List   Diagnosis Date Noted  . Rotator cuff tendinitis 08/11/2016  . Essential hypertension 09/17/2015  . Arthritis 10/09/2012    Past Surgical History:  Procedure Laterality Date  . TUBAL LIGATION      Family History  Problem Relation Age of Onset  . Hypertension Mother   . Diabetes Mother   . Diabetes Sister   . Hypertension Father   . Kidney failure Father     Social History   Socioeconomic History  . Marital status: Single    Spouse name: Not on file  . Number of children: 6  . Years of education: 64  . Highest education level: Not on file  Social Needs  . Financial resource strain: Not on file  . Food insecurity - worry: Not on file  . Food insecurity - inability: Not on file  . Transportation needs - medical: Not on file  . Transportation needs - non-medical: Not on file  Occupational History  . Occupation:  Disability  Tobacco Use  . Smoking status: Never Smoker  . Smokeless tobacco: Never Used  Substance and Sexual Activity  . Alcohol use: No    Alcohol/week: 0.0 oz  . Drug use: No  . Sexual activity: No    Partners: Male  Other Topics Concern  . Not on file  Social History Narrative  . Not on file     Current Outpatient Medications:  .  hydrochlorothiazide (HYDRODIURIL) 25 MG tablet, Take 1 tablet (25 mg total) by mouth daily., Disp: 30 tablet, Rfl: 1 .  labetalol (NORMODYNE) 100 MG tablet, Take 1 tablet (100 mg total) by mouth 2 (two) times daily. Must keep appt w/new provider for refills, Disp: 180 tablet, Rfl: 2 .  meloxicam (MOBIC) 15 MG tablet, Take 1 tablet (15 mg total) by mouth daily. Must keep appt w/new provider for future refills, Disp: 30 tablet, Rfl: 0  No Known Allergies   ROS See HPI  Objective  Vitals:   07/07/17 1112 07/07/17 2040  BP: (!) 178/100 (!) 170/100  Pulse: 91   Resp: 16   Temp: 98.1 F (36.7 C)   TempSrc: Oral   SpO2: 98%   Weight: 207 lb (93.9 kg)   Height: 5\' 3"  (1.6 m)    Body mass index is 36.67 kg/m.  Physical Exam Vital signs reviewed Constitutional: Patient appears well-developed and well-nourished. No  distress.  HENT: Head: Normocephalic and atraumatic. Nose: Nose normal. Mouth/Throat: Oropharynx is clear and moist. Eyes: Conjunctivae are normal. No scleral icterus.  Neck: Normal range of motion. Neck supple. Cardiovascular: Normal rate, regular rhythm and normal heart sounds.  No murmur heard. No BLE edema. Distal pulses intact. Pulmonary/Chest: Effort normal and breath sounds normal. No respiratory distress. Musculoskeletal: Normal range of motion, no joint effusions. No gross deformities Neurological: She is alert and oriented to person, place, and time. Coordination, balance, speech are normal.  Skin: Skin is warm and dry. No rash noted. No erythema.  Psychiatric: Patient has a normal mood and affect. behavior is normal.  Judgment and thought content normal.  Recent Results (from the past 2160 hour(s))  CBC with Differential/Platelet     Status: None   Collection Time: 06/06/17 11:39 AM  Result Value Ref Range   WBC 6.9 3.8 - 10.8 Thousand/uL   RBC 4.26 3.80 - 5.10 Million/uL   Hemoglobin 12.8 11.7 - 15.5 g/dL   HCT 37.4 35.0 - 45.0 %   MCV 87.8 80.0 - 100.0 fL   MCH 30.0 27.0 - 33.0 pg   MCHC 34.2 32.0 - 36.0 g/dL   RDW 11.8 11.0 - 15.0 %   Platelets 351 140 - 400 Thousand/uL   MPV 10.6 7.5 - 12.5 fL   Neutro Abs 4,078 1,500 - 7,800 cells/uL   Lymphs Abs 2,001 850 - 3,900 cells/uL   WBC mixed population 635 200 - 950 cells/uL   Eosinophils Absolute 159 15 - 500 cells/uL   Basophils Absolute 28 0 - 200 cells/uL   Neutrophils Relative % 59.1 %   Total Lymphocyte 29.0 %   Monocytes Relative 9.2 %   Eosinophils Relative 2.3 %   Basophils Relative 0.4 %  Comprehensive metabolic panel     Status: None   Collection Time: 06/06/17 11:39 AM  Result Value Ref Range   Sodium 139 135 - 145 mEq/L   Potassium 4.3 3.5 - 5.1 mEq/L   Chloride 101 96 - 112 mEq/L   CO2 29 19 - 32 mEq/L   Glucose, Bld 89 70 - 99 mg/dL   BUN 13 6 - 23 mg/dL   Creatinine, Ser 1.00 0.40 - 1.20 mg/dL   Total Bilirubin 0.4 0.2 - 1.2 mg/dL   Alkaline Phosphatase 74 39 - 117 U/L   AST 18 0 - 37 U/L   ALT 18 0 - 35 U/L   Total Protein 8.3 6.0 - 8.3 g/dL   Albumin 4.5 3.5 - 5.2 g/dL   Calcium 9.8 8.4 - 10.5 mg/dL   GFR 74.17 >60.00 mL/min    Assessment & Plan RTC in 2 weeks for F/U of BP, HCTZ dosage adjustment

## 2017-07-20 ENCOUNTER — Ambulatory Visit (INDEPENDENT_AMBULATORY_CARE_PROVIDER_SITE_OTHER): Payer: Medicare Other | Admitting: Nurse Practitioner

## 2017-07-20 ENCOUNTER — Encounter: Payer: Self-pay | Admitting: Nurse Practitioner

## 2017-07-20 VITALS — BP 148/88 | HR 88 | Temp 98.7°F | Resp 16 | Ht 63.0 in | Wt 198.4 lb

## 2017-07-20 DIAGNOSIS — I10 Essential (primary) hypertension: Secondary | ICD-10-CM | POA: Diagnosis not present

## 2017-07-20 DIAGNOSIS — M1990 Unspecified osteoarthritis, unspecified site: Secondary | ICD-10-CM | POA: Diagnosis not present

## 2017-07-20 MED ORDER — AMLODIPINE BESYLATE 5 MG PO TABS: 5.0000 mg | ORAL_TABLET | Freq: Every day | ORAL | 3 refills | Status: DC

## 2017-07-20 NOTE — Assessment & Plan Note (Signed)
Stable. We discussed using NSAIDs prn only on days when pain is more severe and tylenol on days with mild to moderate pain. Encouraged to stay active. F/U for new or worsening symptoms

## 2017-07-20 NOTE — Assessment & Plan Note (Addendum)
BP remains above goal  Discussed adding another agent to control BP and she is agreeable- will continue labetolol, HCTZ, and add amlodipine-dosing and side effects discussed BMET today for increased HCTZ dosage Encouraged to continue monitoring BP at home-she brought her personal monitor in for calibration today RTC in 2-3 weeks for F/U - Basic metabolic panel; Future - amLODipine (NORVASC) 5 MG tablet; Take 1 tablet (5 mg total) by mouth daily.  Dispense: 90 tablet; Refill: 3

## 2017-07-20 NOTE — Progress Notes (Signed)
Name: Olivia Patel   MRN: [id number]    DOB: 1961-01-27   Date:07/20/2017       Progress Note  Subjective  Chief Complaint  Chief Complaint  Patient presents with  . Follow-up    blood pressure    HPI  Hypertension -maintained on labetolol 100 and her HCTZ dosage was increased to 25 daily at her last OV due to continued elevated BP readings. We also discussed using tylenol rather than NSAIDs at last OV for her OA pain to see if NSAIDs were contributing to her elevated BP readings - she had been taking daily mobic or advil prior to her last OV. Reports she has increased her HCTZ dosage and taking medications daily as instructed without noted adverse medication effects. Reports she has been checking BP occasionally at drug store - last check was 150/88. She denies headaches, vision changes, chest pain, shortness of breath, edema. She says she feels well. She has been trying to stay active - playing with grandchildren and walking on most days- and her OA pain has been manageable without NSAIDs-her pain is the worst on days when she is very active and she wonders what to take on those days.  BP Readings from Last 3 Encounters:  07/20/17 (!) 148/88  07/07/17 (!) 170/100  06/06/17 (!) 162/90     Patient Active Problem List   Diagnosis Date Noted  . Rotator cuff tendinitis 08/11/2016  . Essential hypertension 09/17/2015  . Arthritis 10/09/2012    Past Surgical History:  Procedure Laterality Date  . TUBAL LIGATION      Family History  Problem Relation Age of Onset  . Hypertension Mother   . Diabetes Mother   . Diabetes Sister   . Hypertension Father   . Kidney failure Father     Social History   Socioeconomic History  . Marital status: Single    Spouse name: Not on file  . Number of children: 6  . Years of education: 52  . Highest education level: Not on file  Occupational History  . Occupation: Disability  Social Needs  . Financial resource strain: Not on file   . Food insecurity:    Worry: Not on file    Inability: Not on file  . Transportation needs:    Medical: Not on file    Non-medical: Not on file  Tobacco Use  . Smoking status: Never Smoker  . Smokeless tobacco: Never Used  Substance and Sexual Activity  . Alcohol use: No    Alcohol/week: 0.0 oz  . Drug use: No  . Sexual activity: Never    Partners: Male  Lifestyle  . Physical activity:    Days per week: Not on file    Minutes per session: Not on file  . Stress: Not on file  Relationships  . Social connections:    Talks on phone: Not on file    Gets together: Not on file    Attends religious service: Not on file    Active member of club or organization: Not on file    Attends meetings of clubs or organizations: Not on file    Relationship status: Not on file  . Intimate partner violence:    Fear of current or ex partner: Not on file    Emotionally abused: Not on file    Physically abused: Not on file    Forced sexual activity: Not on file  Other Topics Concern  . Not on file  Social History Narrative  .  Not on file     Current Outpatient Medications:  .  hydrochlorothiazide (HYDRODIURIL) 25 MG tablet, Take 1 tablet (25 mg total) by mouth daily., Disp: 30 tablet, Rfl: 1 .  labetalol (NORMODYNE) 100 MG tablet, Take 1 tablet (100 mg total) by mouth 2 (two) times daily. Must keep appt w/new provider for refills, Disp: 180 tablet, Rfl: 2  No Known Allergies   ROS See HPI  Objective  Vitals:   07/20/17 1007  BP: (!) 148/88  Pulse: 88  Resp: 16  Temp: 98.7 F (37.1 C)  TempSrc: Oral  SpO2: 96%  Weight: 198 lb 6.4 oz (90 kg)  Height: 5\' 3"  (1.6 m)    Body mass index is 35.14 kg/m.  Physical Exam Vital signs reviewed. Constitutional: Patient appears well-developed and well-nourished. No distress.  HENT: Head: Normocephalic and atraumatic. Nose: Nose normal. Mouth/Throat: Oropharynx is clear and moist. No oropharyngeal exudate.  Eyes: Conjunctivae are  normal. Pupils equal, round and reactive to light. No scleral icterus.  Neck: Normal range of motion. Neck supple. Cardiovascular: Normal rate, regular rhythm and normal heart sounds.  No murmur heard. No BLE edema. Distal pulses intact. Pulmonary/Chest: Effort normal and breath sounds normal. No respiratory distress. Musculoskeletal: No gross deformities Neurological: She is alert and oriented to person, place, and time. Coordination, balance, strength, speech and gait are normal.  Skin: Skin is warm and dry. No rash noted. No erythema.  Psychiatric: Patient has a normal mood and affect. behavior is normal. Judgment and thought content normal.  Assessment & Plan RTC in about 2-3 weeks for F/u- adding amlodipine; CPE if stable

## 2017-07-20 NOTE — Patient Instructions (Addendum)
Please head downstairs for lab work.  Please continue your labetolol and HCTZ as you have been taking. Please ADD amlodipine 5mg  once daily Please try to check your blood pressure once daily or at least a few times a week, at the same time each day, and keep a log. We need to get your blood pressure consistently under 140/90.  Please return in about 2-3 weeks so we can see how you are doing on the new medication.  We will catch up on your physical once your blood pressure is stable.

## 2017-08-29 ENCOUNTER — Ambulatory Visit: Payer: Self-pay | Admitting: *Deleted

## 2017-08-29 MED ORDER — METHOCARBAMOL 500 MG PO TABS: 500.0000 mg | ORAL_TABLET | Freq: Three times a day (TID) | ORAL | 0 refills | Status: DC | PRN

## 2017-08-29 NOTE — Telephone Encounter (Signed)
Would patient need to wait until their appointment with Ashleigh on Wed or would you be able to go ahead and send in muscle relaxer to get her to then? Please advise.

## 2017-08-29 NOTE — Addendum Note (Signed)
Addended by: Sherlene Shams on: 08/29/2017 04:44 PM   Modules accepted: Orders

## 2017-08-29 NOTE — Telephone Encounter (Signed)
Pt called with a "pull muscle" in her back on the left side, while getting into her daughter's car. She denies fever and dehydration. She is taking Advil and using a heating pad for her back and they do help.  She has an appointment already scheduled for Wednesday. She is requesting a muscle relaxer and would call back regarding this please. Will route this to [facility] at [facility].   Reason for Disposition . [1] MODERATE pain (e.g., interferes with normal activities) AND [2] present > 3 days  Answer Assessment - Initial Assessment Questions 1. ONSET: "When did the muscle aches or body pains start?"      Lower back 2. LOCATION: "What part of your body is hurting?" (e.g., entire body, arms, legs)      Lower back on the left side 3. SEVERITY: "How bad is the pain?" (Scale 1-10; or mild, moderate, severe)   - MILD (1-3): doesn't interfere with normal activities    - MODERATE (4-7): interferes with normal activities or awakens from sleep    - SEVERE (8-10):  excruciating pain, unable to do any normal activities      Pain # 6 or 7 4. CAUSE: "What do you think is causing the pains?"     Pulled muscle 5. FEVER: "Have you been having fever?"     no 6. OTHER SYMPTOMS: "Do you have any other symptoms?" (e.g., chest pain, weakness, rash, cold or flu symptoms, weight loss)     no 7. PREGNANCY: "Is there any chance you are pregnant?" "When was your last menstrual period?"     No periods 8. TRAVEL: "Have you traveled out of the country in the last month?" (e.g., travel history, exposures)     no  Protocols used: MUSCLE ACHES AND BODY PAIN-A-AH

## 2017-08-29 NOTE — Telephone Encounter (Signed)
I will send in muscle relaxer; keep planned follow-up with Ashleigh for Wednesday;

## 2017-08-31 ENCOUNTER — Encounter: Payer: Self-pay | Admitting: Nurse Practitioner

## 2017-08-31 ENCOUNTER — Ambulatory Visit (INDEPENDENT_AMBULATORY_CARE_PROVIDER_SITE_OTHER): Payer: Medicare Other | Admitting: Nurse Practitioner

## 2017-08-31 ENCOUNTER — Other Ambulatory Visit (INDEPENDENT_AMBULATORY_CARE_PROVIDER_SITE_OTHER): Payer: Medicare Other

## 2017-08-31 ENCOUNTER — Telehealth: Payer: Self-pay | Admitting: Nurse Practitioner

## 2017-08-31 VITALS — BP 140/80 | HR 96 | Temp 98.7°F | Resp 16 | Ht 63.0 in | Wt 193.0 lb

## 2017-08-31 DIAGNOSIS — I10 Essential (primary) hypertension: Secondary | ICD-10-CM

## 2017-08-31 DIAGNOSIS — M545 Low back pain, unspecified: Secondary | ICD-10-CM

## 2017-08-31 LAB — BASIC METABOLIC PANEL
BUN: 11 mg/dL (ref 6–23)
CHLORIDE: 93 meq/L — AB (ref 96–112)
CO2: 32 mEq/L (ref 19–32)
CREATININE: 0.84 mg/dL (ref 0.40–1.20)
Calcium: 10 mg/dL (ref 8.4–10.5)
GFR: 90.63 mL/min (ref >60.00)
Glucose, Bld: 147 mg/dL — ABNORMAL HIGH (ref 70–99)
POTASSIUM: 3.5 meq/L (ref 3.5–5.1)
Sodium: 136 mEq/L (ref 135–145)

## 2017-08-31 NOTE — Telephone Encounter (Signed)
Yes I verbally called [facility] @ [place] and spoke with pharmacist and he took verbal over the phone for script.

## 2017-08-31 NOTE — Assessment & Plan Note (Signed)
BP reading stable today, continue current medications She will continue to monitor BP at home and F/u in 3 months for recheck She will have BMET drawn in lab today

## 2017-08-31 NOTE — Patient Instructions (Addendum)
Labs downstairs today.  Please try to check your blood pressure once daily or at least a few times a week, at the same time each day, and keep a log. Let me know if you are getting readings over 140/90 at home  Please return in about 3 months for a physical.   Back Exercises If you have pain in your back, do these exercises 2-3 times each day or as told by your doctor. When the pain goes away, do the exercises once each day, but repeat the steps more times for each exercise (do more repetitions). If you do not have pain in your back, do these exercises once each day or as told by your doctor. Exercises Single Knee to Chest  Do these steps 3-5 times in a row for each leg: 1. Lie on your back on a firm bed or the floor with your legs stretched out. 2. Bring one knee to your chest. 3. Hold your knee to your chest by grabbing your knee or thigh. 4. Pull on your knee until you feel a gentle stretch in your lower back. 5. Keep doing the stretch for 10-30 seconds. 6. Slowly let go of your leg and straighten it.  Pelvic Tilt  Do these steps 5-10 times in a row: 1. Lie on your back on a firm bed or the floor with your legs stretched out. 2. Bend your knees so they point up to the ceiling. Your feet should be flat on the floor. 3. Tighten your lower belly (abdomen) muscles to press your lower back against the floor. This will make your tailbone point up to the ceiling instead of pointing down to your feet or the floor. 4. Stay in this position for 5-10 seconds while you gently tighten your muscles and breathe evenly.  Cat-Cow  Do these steps until your lower back bends more easily: 1. Get on your hands and knees on a firm surface. Keep your hands under your shoulders, and keep your knees under your hips. You may put padding under your knees. 2. Let your head hang down, and make your tailbone point down to the floor so your lower back is round like the back of a cat. 3. Stay in this position  for 5 seconds. 4. Slowly lift your head and make your tailbone point up to the ceiling so your back hangs low (sags) like the back of a cow. 5. Stay in this position for 5 seconds.  Press-Ups  Do these steps 5-10 times in a row: 1. Lie on your belly (face-down) on the floor. 2. Place your hands near your head, about shoulder-width apart. 3. While you keep your back relaxed and keep your hips on the floor, slowly straighten your arms to raise the top half of your body and lift your shoulders. Do not use your back muscles. To make yourself more comfortable, you may change where you place your hands. 4. Stay in this position for 5 seconds. 5. Slowly return to lying flat on the floor.  Bridges  Do these steps 10 times in a row: 1. Lie on your back on a firm surface. 2. Bend your knees so they point up to the ceiling. Your feet should be flat on the floor. 3. Tighten your butt muscles and lift your butt off of the floor until your waist is almost as high as your knees. If you do not feel the muscles working in your butt and the back of your thighs, slide your feet  1-2 inches farther away from your butt. 4. Stay in this position for 3-5 seconds. 5. Slowly lower your butt to the floor, and let your butt muscles relax.  If this exercise is too easy, try doing it with your arms crossed over your chest. Belly Crunches  Do these steps 5-10 times in a row: 1. Lie on your back on a firm bed or the floor with your legs stretched out. 2. Bend your knees so they point up to the ceiling. Your feet should be flat on the floor. 3. Cross your arms over your chest. 4. Tip your chin a little bit toward your chest but do not bend your neck. 5. Tighten your belly muscles and slowly raise your chest just enough to lift your shoulder blades a tiny bit off of the floor. 6. Slowly lower your chest and your head to the floor.  Back Lifts Do these steps 5-10 times in a row: 1. Lie on your belly (face-down) with  your arms at your sides, and rest your forehead on the floor. 2. Tighten the muscles in your legs and your butt. 3. Slowly lift your chest off of the floor while you keep your hips on the floor. Keep the back of your head in line with the curve in your back. Look at the floor while you do this. 4. Stay in this position for 3-5 seconds. 5. Slowly lower your chest and your face to the floor.  Contact a doctor if:  Your back pain gets a lot worse when you do an exercise.  Your back pain does not lessen 2 hours after you exercise. If you have any of these problems, stop doing the exercises. Do not do them again unless your doctor says it is okay. Get help right away if:  You have sudden, very bad back pain. If this happens, stop doing the exercises. Do not do them again unless your doctor says it is okay. This information is not intended to replace advice given to you by your health care provider. Make sure you discuss any questions you have with your health care provider. Document Released: 05/15/2010 Document Revised: 09/18/2015 Document Reviewed: 06/06/2014 Elsevier Interactive Patient Education  [employer].

## 2017-08-31 NOTE — Telephone Encounter (Signed)
Copied from [place] [id number]. Topic: Quick Communication - See Telephone Encounter >> Aug 31, 2017 11:20 AM Ether Griffins B wrote: CRM for notification. See Telephone encounter for: 08/31/17.  Pt calling requesting her methocarbamol (ROBAXIN) 500 MG tablet be resent to pharmacy. The pharmacy is stating they do not have it.

## 2017-08-31 NOTE — Progress Notes (Signed)
Name: Olivia Patel   MRN: [id number]    DOB: [birth year]   Date:08/31/2017       Progress Note  Subjective  Chief Complaint  Chief Complaint  Patient presents with  . Back Pain    since saturday from getting in her daughters SUV she has had back pain, thinks she pulled a muscle     HPI She is here to follow up for hypertension. She is also requesting evaluation of back pain.  Hypertension -maintained on labetolol 100 bid, HCTZ 25 daily and Amlodipine 5 daily was added at her last OV for continued elevated BP readings Reports daily medication compliance without adverse medication effects. Reports recent home readings 110's/70s- checks a few times week. Denies headaches, vision changes, chest pain, shortness of breath, edema. A BMET was ordered at her last OV for HCTZ increase but she forgot to go to the lab for lab draw  BP Readings from Last 3 Encounters:  08/31/17 140/80  07/20/17 (!) 148/88  07/07/17 (!) 170/100   Back pain-  This is an acute problem. The pain is in her left lower back. The pain began while She was climbing into her daughters SUV this past Friday. She felt a muscle pull in her back and has had left lower back pain since- she describes as tight, sore. The pain has been improving since onset She has been using icy hot, advil, heating pad at home She denies weakness, falls, swelling, numbness, or tingling. She called the office to request a muscle relaxer and robaxin was sent but she was not notified of this.   Patient Active Problem List   Diagnosis Date Noted  . Rotator cuff tendinitis 08/11/2016  . Essential hypertension 09/17/2015  . Arthritis 10/09/2012    Past Surgical History:  Procedure Laterality Date  . TUBAL LIGATION      Family History  Problem Relation Age of Onset  . Hypertension Mother   . Diabetes Mother   . Diabetes Sister   . Hypertension Father   . Kidney failure Father     Social History   Socioeconomic History  .  Marital status: Single    Spouse name: Not on file  . Number of children: 6  . Years of education: 17  . Highest education level: Not on file  Occupational History  . Occupation: Disability  Social Needs  . Financial resource strain: Not on file  . Food insecurity:    Worry: Not on file    Inability: Not on file  . Transportation needs:    Medical: Not on file    Non-medical: Not on file  Tobacco Use  . Smoking status: Never Smoker  . Smokeless tobacco: Never Used  Substance and Sexual Activity  . Alcohol use: No    Alcohol/week: 0.0 oz  . Drug use: No  . Sexual activity: Never    Partners: Male  Lifestyle  . Physical activity:    Days per week: Not on file    Minutes per session: Not on file  . Stress: Not on file  Relationships  . Social connections:    Talks on phone: Not on file    Gets together: Not on file    Attends religious service: Not on file    Active member of club or organization: Not on file    Attends meetings of clubs or organizations: Not on file    Relationship status: Not on file  . Intimate partner violence:    Fear of current  or ex partner: Not on file    Emotionally abused: Not on file    Physically abused: Not on file    Forced sexual activity: Not on file  Other Topics Concern  . Not on file  Social History Narrative  . Not on file     Current Outpatient Medications:  .  amLODipine (NORVASC) 5 MG tablet, Take 1 tablet (5 mg total) by mouth daily., Disp: 90 tablet, Rfl: 3 .  hydrochlorothiazide (HYDRODIURIL) 25 MG tablet, Take 1 tablet (25 mg total) by mouth daily., Disp: 30 tablet, Rfl: 1 .  labetalol (NORMODYNE) 100 MG tablet, Take 1 tablet (100 mg total) by mouth 2 (two) times daily. Must keep appt w/new provider for refills, Disp: 180 tablet, Rfl: 2 .  methocarbamol (ROBAXIN) 500 MG tablet, Take 1 tablet (500 mg total) by mouth every 8 (eight) hours as needed for muscle spasms., Disp: 20 tablet, Rfl: 0  No Known  Allergies   ROS See HPI  Objective  Vitals:   08/31/17 0932  BP: 140/80  Pulse: 96  Resp: 16  Temp: 98.7 F (37.1 C)  TempSrc: Oral  SpO2: 96%  Weight: 193 lb (87.5 kg)  Height: 5\' 3"  (1.6 m)    Body mass index is 34.19 kg/m.  Physical Exam Vital signs reviewed. Constitutional: Patient appears well-developed and well-nourished. No distress.  HENT: Head: Normocephalic and atraumatic. Nose: Nose normal. Mouth/Throat: Oropharynx is clear and moist. No oropharyngeal exudate.  Eyes: Conjunctivae are normal. Pupils equal, round and reactive to light. No scleral icterus.  Neck: Normal range of motion. Neck supple. Cardiovascular: Normal rate, regular rhythm and normal heart sounds. No BLE edema. Distal pulses intact. Pulmonary/Chest: Effort normal and breath sounds normal. No respiratory distress. Musculoskeletal: No gross deformities. Neurological:She is alert and oriented to person, place, and time. Coordination, balance, strength, speech and gait are normal.  Skin: Skin is warm and dry. No rash noted. No erythema.  Psychiatric: Patient has a normal mood and affect. behavior is normal. Judgment and thought content normal.  Assessment & Plan RTC in 3 months for CPE, F/U HTN   Acute left-sided low back pain without sciatica Improving. Discussed management of back pain at home including prn muscle relaxer, daily exercises and information was printed on AVS F/u for new or worsening symptoms

## 2017-08-31 NOTE — Telephone Encounter (Signed)
Was this fixed? Please advise.

## 2017-09-14 ENCOUNTER — Ambulatory Visit (HOSPITAL_COMMUNITY)
Admission: EM | Admit: 2017-09-14 | Discharge: 2017-09-14 | Disposition: A | Discharge status: Home / Self Care | Payer: Medicare Other | Attending: Family Medicine | Admitting: Family Medicine

## 2017-09-14 ENCOUNTER — Encounter (HOSPITAL_COMMUNITY): Payer: Self-pay | Admitting: Family Medicine

## 2017-09-14 ENCOUNTER — Ambulatory Visit: Payer: Medicare Other | Admitting: Family

## 2017-09-14 DIAGNOSIS — M25551 Pain in right hip: Secondary | ICD-10-CM

## 2017-09-14 DIAGNOSIS — M25511 Pain in right shoulder: Secondary | ICD-10-CM

## 2017-09-14 DIAGNOSIS — Z0289 Encounter for other administrative examinations: Secondary | ICD-10-CM

## 2017-09-14 MED ORDER — KETOROLAC TROMETHAMINE 60 MG/2ML IM SOLN
60.0000 mg | Freq: Once | INTRAMUSCULAR | Status: AC
  Administered 2017-09-14: 60 mg via INTRAMUSCULAR

## 2017-09-14 MED ORDER — PREDNISONE 50 MG PO TABS: 50.0000 mg | ORAL_TABLET | Freq: Every day | ORAL | 0 refills | Status: DC

## 2017-09-14 MED ORDER — DICLOFENAC SODIUM 75 MG PO TBEC: 75.0000 mg | DELAYED_RELEASE_TABLET | Freq: Two times a day (BID) | ORAL | 0 refills | Status: DC

## 2017-09-14 MED ORDER — KETOROLAC TROMETHAMINE 60 MG/2ML IM SOLN
INTRAMUSCULAR | Status: AC
  Filled 2017-09-14: qty 2

## 2017-09-14 NOTE — Discharge Instructions (Signed)
Please use diclofenac twice daily for pain. May also use tylenol.   Prednisone daily for 5 days with food.  Follow up with PCP  Please return here or go to emergency room pain worsening, develops weakness, difficulty speaking, headache, dizziness, lightheadedness, passing out, fever, worsening cough.

## 2017-09-14 NOTE — ED Triage Notes (Addendum)
Pt with hx of arthritis here for chronic right hip pain. She is also having acute right neck, shoulder and arm pain x 1 week. She denies any injury, she took a muscle relaxer this am.

## 2017-09-15 NOTE — ED Provider Notes (Signed)
[place]    CSN: [id number] Arrival date & time: 09/14/17  1052     History   Chief Complaint Chief Complaint  Patient presents with  . Hip Pain  . Shoulder Pain    HPI Olivia Patel is a 57 y.o. female history of hypertension and arthritis presenting today for evaluation of right hip pain and right shoulder pain.  Patient states that approximately 3 weeks ago she strained a muscle in her back.  Since she has been taking Robaxin with mild relief.  Since she has developed right neck/shoulder pain beginning on Saturday.  Denies any injury.  And today she woke up with right hip pain.  Denies any injury.  Shoulder pain is slightly improved but still persistent.  Pain worsens with movement.  Denies any numbness or tingling in upper extremities or lower extremities.  Denies radiation of pain into the leg.  Hip pain is located deep into the gluteal area on right side.  Hip pain is chronic.  Patient was previously on meloxicam daily, but was taken off of this due to blood pressure concerns.  Denies weakness.  Daughter present with patient and denies changes from baseline.  Since patient injured her back she has had decreased activity, not moving around much throughout the day.  HPI  Past Medical History:  Diagnosis Date  . Anemia   . Arthritis   . Hypertension     Patient Active Problem List   Diagnosis Date Noted  . Rotator cuff tendinitis 08/11/2016  . Essential hypertension 09/17/2015  . Arthritis 10/09/2012    Past Surgical History:  Procedure Laterality Date  . TUBAL LIGATION      OB History    Gravida  6   Para  6   Term  6   Preterm      AB      Living  6     SAB      TAB      Ectopic      Multiple      Live Births  6            Home Medications    Prior to Admission medications   Medication Sig Start Date End Date Taking? Authorizing Provider  amLODipine (NORVASC) 5 MG tablet Take 1 tablet (5 mg total) by mouth daily. 07/20/17    Lance Sell, NP  diclofenac (VOLTAREN) 75 MG EC tablet Take 1 tablet (75 mg total) by mouth 2 (two) times daily. 09/14/17   Wieters, Hallie C, PA-C  hydrochlorothiazide (HYDRODIURIL) 25 MG tablet Take 1 tablet (25 mg total) by mouth daily. 07/07/17   Lance Sell, NP  labetalol (NORMODYNE) 100 MG tablet Take 1 tablet (100 mg total) by mouth 2 (two) times daily. Must keep appt w/new provider for refills 06/06/17   Lance Sell, NP  methocarbamol (ROBAXIN) 500 MG tablet Take 1 tablet (500 mg total) by mouth every 8 (eight) hours as needed for muscle spasms. 08/29/17   Marrian Salvage, FNP  predniSONE (DELTASONE) 50 MG tablet Take 1 tablet (50 mg total) by mouth daily for 5 days. 09/14/17 09/19/17  Wieters, Elesa Hacker, PA-C    Family History Family History  Problem Relation Age of Onset  . Hypertension Mother   . Diabetes Mother   . Diabetes Sister   . Hypertension Father   . Kidney failure Father     Social History Social History   Tobacco Use  . Smoking status: Never Smoker  .  Smokeless tobacco: Never Used  Substance Use Topics  . Alcohol use: No    Alcohol/week: 0.0 oz  . Drug use: No     Allergies   Patient has no known allergies.   Review of Systems Review of Systems  Constitutional: Negative for activity change and appetite change.  HENT: Negative for trouble swallowing.   Eyes: Negative for pain and visual disturbance.  Respiratory: Negative for shortness of breath.   Cardiovascular: Negative for chest pain.  Gastrointestinal: Negative for abdominal pain, nausea and vomiting.  Musculoskeletal: Positive for arthralgias, back pain, gait problem, myalgias, neck pain and neck stiffness.  Skin: Negative for color change and wound.  Neurological: Positive for headaches. Negative for dizziness, seizures, syncope, weakness, light-headedness and numbness.     Physical Exam Triage Vital Signs ED Triage Vitals  Enc Vitals Group     BP 09/14/17  1146 (!) 141/81     Pulse Rate 09/14/17 1146 93     Resp 09/14/17 1146 18     Temp 09/14/17 1146 98.1 F (36.7 C)     Temp src --      SpO2 09/14/17 1146 98 %     Weight --      Height --      Head Circumference --      Peak Flow --      Pain Score 09/14/17 1143 10     Pain Loc --      Pain Edu? --      Excl. in [place]? --    No data found.  Updated Vital Signs BP (!) 141/81   Pulse 93   Temp 98.1 F (36.7 C)   Resp 18   SpO2 98%   Visual Acuity Right Eye Distance:   Left Eye Distance:   Bilateral Distance:    Right Eye Near:   Left Eye Near:    Bilateral Near:     Physical Exam  Constitutional: She is oriented to person, place, and time. She appears well-developed and well-nourished. No distress.  HENT:  Head: Normocephalic and atraumatic.  Oral mucosa pink and moist, no tonsillar enlargement or exudate. Posterior pharynx patent and nonerythematous, no uvula deviation or swelling. Normal phonation.  Eyes: Pupils are equal, round, and reactive to light. Conjunctivae and EOM are normal.  Neck: Neck supple.  Tenderness to palpation to right upper sternocleidomastoid area, limited range of motion with rightward rotation, nontender to palpation of cervical spine midline.  Cardiovascular: Normal rate and regular rhythm.  No murmur heard. Pulmonary/Chest: Effort normal and breath sounds normal. No respiratory distress.  Breathing comfortably at rest, CTABL, no wheezing, rales or other adventitious sounds auscultated  Abdominal: Soft. There is no tenderness.  Musculoskeletal: She exhibits no edema.  Right shoulder: Nontender to palpation of the clavicle, mild tenderness to palpation over [facility] joint, nontender to palpation over scapular spine, full passive range of motion of shoulder, limited active range of motion due to pain.  Nontender to palpation of thoracic and lumbar spine, nontender to palpation of lumbar musculature, hip pain not reproducible with palpation over right  gluteal area.  Neurological: She is alert and oriented to person, place, and time.  Patient A&O x3, cranial nerves II-XII grossly intact, strength at shoulders, hips and knees 5/5, equal bilaterally, patellar reflex 1+ bilaterally. Normal Finger to nose, RAM and heel to shin.   Skin: Skin is warm and dry.  Psychiatric: She has a normal mood and affect.  Nursing note and vitals reviewed.  UC Treatments / Results  Labs (all labs ordered are listed, but only abnormal results are displayed) Labs Reviewed - No data to display  EKG None  Radiology No results found.  Procedures Procedures (including critical care time)  Medications Ordered in UC Medications  ketorolac (TORADOL) injection 60 mg (60 mg Intramuscular Given 09/14/17 1320)    Initial Impression / Assessment and Plan / UC Course  I have reviewed the triage vital signs and the nursing notes.  Pertinent labs & imaging results that were available during my care of the patient were reviewed by me and considered in my medical decision making (see chart for details).     Patient likely with arthritic hip pain, similar to previous hip pain.  Patient does not appear to have an adhesive capsulitis of the shoulder.  No injury initiating either arthralgia.  Will defer imaging at this time.  Will initiate NSAIDs and prednisone for 5 days.  Injection of Toradol today.  No focal neuro deficit.  Follow-up with PCP.  Discussed avoiding bedrest with back pain.  Discussed weight loss in order to help back as well.  Encourage low impact activities.Discussed strict return precautions. Patient verbalized understanding and is agreeable with plan.  Final Clinical Impressions(s) / UC Diagnoses   Final diagnoses:  Right hip pain  Acute pain of right shoulder     Discharge Instructions     Please use diclofenac twice daily for pain. May also use tylenol.   Prednisone daily for 5 days with food.  Follow up with PCP  Please return here  or go to emergency room pain worsening, develops weakness, difficulty speaking, headache, dizziness, lightheadedness, passing out, fever, worsening cough.   ED Prescriptions    Medication Sig Dispense Auth. Provider   predniSONE (DELTASONE) 50 MG tablet Take 1 tablet (50 mg total) by mouth daily for 5 days. 5 tablet Wieters, Hallie C, PA-C   diclofenac (VOLTAREN) 75 MG EC tablet Take 1 tablet (75 mg total) by mouth 2 (two) times daily. 30 tablet Wieters, [place] C, PA-C     Controlled Substance Prescriptions [place] Controlled Substance Registry consulted? Not Applicable   Janith Lima, [place] 09/15/17 0045

## 2017-09-17 ENCOUNTER — Encounter (HOSPITAL_COMMUNITY): Payer: Self-pay | Admitting: *Deleted

## 2017-09-17 ENCOUNTER — Other Ambulatory Visit: Payer: Self-pay

## 2017-09-17 ENCOUNTER — Emergency Department (HOSPITAL_COMMUNITY): Payer: Medicare Other

## 2017-09-17 ENCOUNTER — Inpatient Hospital Stay (HOSPITAL_COMMUNITY)
Admission: EM | Admit: 2017-09-17 | Discharge: 2017-09-26 | DRG: 871 | Disposition: A | Discharge status: Home Health | Payer: Medicare Other | Attending: Internal Medicine | Admitting: Internal Medicine

## 2017-09-17 ENCOUNTER — Inpatient Hospital Stay (HOSPITAL_COMMUNITY): Payer: Medicare Other

## 2017-09-17 DIAGNOSIS — R911 Solitary pulmonary nodule: Secondary | ICD-10-CM | POA: Diagnosis present

## 2017-09-17 DIAGNOSIS — G9341 Metabolic encephalopathy: Secondary | ICD-10-CM | POA: Diagnosis present

## 2017-09-17 DIAGNOSIS — R0603 Acute respiratory distress: Secondary | ICD-10-CM | POA: Diagnosis not present

## 2017-09-17 DIAGNOSIS — Z8052 Family history of malignant neoplasm of bladder: Secondary | ICD-10-CM | POA: Diagnosis not present

## 2017-09-17 DIAGNOSIS — I10 Essential (primary) hypertension: Secondary | ICD-10-CM | POA: Diagnosis not present

## 2017-09-17 DIAGNOSIS — R627 Adult failure to thrive: Secondary | ICD-10-CM | POA: Diagnosis not present

## 2017-09-17 DIAGNOSIS — R609 Edema, unspecified: Secondary | ICD-10-CM | POA: Diagnosis not present

## 2017-09-17 DIAGNOSIS — R590 Localized enlarged lymph nodes: Secondary | ICD-10-CM | POA: Diagnosis present

## 2017-09-17 DIAGNOSIS — J690 Pneumonitis due to inhalation of food and vomit: Secondary | ICD-10-CM | POA: Diagnosis not present

## 2017-09-17 DIAGNOSIS — M1611 Unilateral primary osteoarthritis, right hip: Secondary | ICD-10-CM | POA: Diagnosis not present

## 2017-09-17 DIAGNOSIS — Z79899 Other long term (current) drug therapy: Secondary | ICD-10-CM

## 2017-09-17 DIAGNOSIS — J9601 Acute respiratory failure with hypoxia: Secondary | ICD-10-CM | POA: Diagnosis present

## 2017-09-17 DIAGNOSIS — D649 Anemia, unspecified: Secondary | ICD-10-CM | POA: Diagnosis not present

## 2017-09-17 DIAGNOSIS — R944 Abnormal results of kidney function studies: Secondary | ICD-10-CM | POA: Diagnosis not present

## 2017-09-17 DIAGNOSIS — Z7952 Long term (current) use of systemic steroids: Secondary | ICD-10-CM

## 2017-09-17 DIAGNOSIS — J189 Pneumonia, unspecified organism: Secondary | ICD-10-CM | POA: Diagnosis not present

## 2017-09-17 DIAGNOSIS — C9000 Multiple myeloma not having achieved remission: Secondary | ICD-10-CM | POA: Diagnosis not present

## 2017-09-17 DIAGNOSIS — N179 Acute kidney failure, unspecified: Secondary | ICD-10-CM

## 2017-09-17 DIAGNOSIS — E8809 Other disorders of plasma-protein metabolism, not elsewhere classified: Secondary | ICD-10-CM | POA: Diagnosis present

## 2017-09-17 DIAGNOSIS — D259 Leiomyoma of uterus, unspecified: Secondary | ICD-10-CM | POA: Diagnosis present

## 2017-09-17 DIAGNOSIS — N170 Acute kidney failure with tubular necrosis: Secondary | ICD-10-CM | POA: Diagnosis not present

## 2017-09-17 DIAGNOSIS — E876 Hypokalemia: Secondary | ICD-10-CM

## 2017-09-17 DIAGNOSIS — W01190A Fall on same level from slipping, tripping and stumbling with subsequent striking against furniture, initial encounter: Secondary | ICD-10-CM | POA: Diagnosis present

## 2017-09-17 DIAGNOSIS — Y92003 Bedroom of unspecified non-institutional (private) residence as the place of occurrence of the external cause: Secondary | ICD-10-CM

## 2017-09-17 DIAGNOSIS — R000 Tachycardia, unspecified: Secondary | ICD-10-CM | POA: Diagnosis not present

## 2017-09-17 DIAGNOSIS — N6312 Unspecified lump in the right breast, upper inner quadrant: Secondary | ICD-10-CM | POA: Diagnosis not present

## 2017-09-17 DIAGNOSIS — J90 Pleural effusion, not elsewhere classified: Secondary | ICD-10-CM | POA: Diagnosis not present

## 2017-09-17 DIAGNOSIS — Z6833 Body mass index (BMI) 33.0-33.9, adult: Secondary | ICD-10-CM

## 2017-09-17 DIAGNOSIS — R55 Syncope and collapse: Secondary | ICD-10-CM | POA: Diagnosis not present

## 2017-09-17 DIAGNOSIS — R358 Other polyuria: Secondary | ICD-10-CM | POA: Diagnosis present

## 2017-09-17 DIAGNOSIS — R4020 Unspecified coma: Secondary | ICD-10-CM | POA: Diagnosis not present

## 2017-09-17 DIAGNOSIS — D72829 Elevated white blood cell count, unspecified: Secondary | ICD-10-CM | POA: Diagnosis not present

## 2017-09-17 DIAGNOSIS — N63 Unspecified lump in unspecified breast: Secondary | ICD-10-CM | POA: Diagnosis not present

## 2017-09-17 DIAGNOSIS — R234 Changes in skin texture: Secondary | ICD-10-CM | POA: Diagnosis present

## 2017-09-17 DIAGNOSIS — M899 Disorder of bone, unspecified: Secondary | ICD-10-CM | POA: Diagnosis not present

## 2017-09-17 DIAGNOSIS — Z8249 Family history of ischemic heart disease and other diseases of the circulatory system: Secondary | ICD-10-CM

## 2017-09-17 DIAGNOSIS — A419 Sepsis, unspecified organism: Secondary | ICD-10-CM | POA: Diagnosis not present

## 2017-09-17 DIAGNOSIS — R531 Weakness: Secondary | ICD-10-CM | POA: Diagnosis not present

## 2017-09-17 DIAGNOSIS — M160 Bilateral primary osteoarthritis of hip: Secondary | ICD-10-CM | POA: Diagnosis not present

## 2017-09-17 DIAGNOSIS — E861 Hypovolemia: Secondary | ICD-10-CM | POA: Diagnosis present

## 2017-09-17 DIAGNOSIS — R928 Other abnormal and inconclusive findings on diagnostic imaging of breast: Secondary | ICD-10-CM | POA: Diagnosis not present

## 2017-09-17 DIAGNOSIS — R05 Cough: Secondary | ICD-10-CM | POA: Diagnosis not present

## 2017-09-17 DIAGNOSIS — R059 Cough, unspecified: Secondary | ICD-10-CM

## 2017-09-17 DIAGNOSIS — C50919 Malignant neoplasm of unspecified site of unspecified female breast: Secondary | ICD-10-CM

## 2017-09-17 DIAGNOSIS — R404 Transient alteration of awareness: Secondary | ICD-10-CM | POA: Diagnosis not present

## 2017-09-17 DIAGNOSIS — C50911 Malignant neoplasm of unspecified site of right female breast: Secondary | ICD-10-CM | POA: Diagnosis present

## 2017-09-17 DIAGNOSIS — R509 Fever, unspecified: Secondary | ICD-10-CM | POA: Diagnosis not present

## 2017-09-17 DIAGNOSIS — E869 Volume depletion, unspecified: Secondary | ICD-10-CM | POA: Diagnosis not present

## 2017-09-17 DIAGNOSIS — N6311 Unspecified lump in the right breast, upper outer quadrant: Secondary | ICD-10-CM | POA: Diagnosis not present

## 2017-09-17 DIAGNOSIS — E860 Dehydration: Secondary | ICD-10-CM

## 2017-09-17 DIAGNOSIS — J9811 Atelectasis: Secondary | ICD-10-CM | POA: Diagnosis present

## 2017-09-17 DIAGNOSIS — D630 Anemia in neoplastic disease: Secondary | ICD-10-CM | POA: Diagnosis not present

## 2017-09-17 DIAGNOSIS — M1990 Unspecified osteoarthritis, unspecified site: Secondary | ICD-10-CM | POA: Diagnosis present

## 2017-09-17 DIAGNOSIS — M8448XA Pathological fracture, other site, initial encounter for fracture: Secondary | ICD-10-CM | POA: Diagnosis not present

## 2017-09-17 DIAGNOSIS — C7951 Secondary malignant neoplasm of bone: Secondary | ICD-10-CM | POA: Diagnosis not present

## 2017-09-17 LAB — CBC WITH DIFFERENTIAL/PLATELET
Abs Immature Granulocytes: 0.5 10*3/uL — ABNORMAL HIGH (ref 0.0–0.1)
BASOS PCT: 0 %
Basophils Absolute: 0 10*3/uL (ref 0.0–0.1)
EOS ABS: 0 10*3/uL (ref 0.0–0.7)
EOS PCT: 0 %
HEMATOCRIT: 36.5 % (ref 36.0–46.0)
Hemoglobin: 12 g/dL (ref 12.0–15.0)
Immature Granulocytes: 3 %
Lymphocytes Relative: 11 %
Lymphs Abs: 1.7 10*3/uL (ref 0.7–4.0)
MCH: 27.8 pg (ref 26.0–34.0)
MCHC: 32.9 g/dL (ref 30.0–36.0)
MCV: 84.5 fL (ref 78.0–100.0)
MONOS PCT: 5 %
Monocytes Absolute: 0.8 10*3/uL (ref 0.1–1.0)
NEUTROS PCT: 81 %
Neutro Abs: 13.3 10*3/uL — ABNORMAL HIGH (ref 1.7–7.7)
PLATELETS: 344 10*3/uL (ref 150–400)
RBC: 4.32 MIL/uL (ref 3.87–5.11)
RDW: 12.7 % (ref 11.5–15.5)
WBC: 16.3 10*3/uL — AB (ref 4.0–10.5)

## 2017-09-17 LAB — PROTEIN, TOTAL: Total Protein: 7.3 g/dL (ref 6.5–8.1)

## 2017-09-17 LAB — URINALYSIS, ROUTINE W REFLEX MICROSCOPIC
BACTERIA UA: NONE SEEN
BILIRUBIN URINE: NEGATIVE
GLUCOSE, UA: NEGATIVE mg/dL
KETONES UR: NEGATIVE mg/dL
Nitrite: NEGATIVE
PH: 6 (ref 5.0–8.0)
Protein, ur: NEGATIVE mg/dL
Specific Gravity, Urine: 1.011 (ref 1.005–1.030)

## 2017-09-17 LAB — CK: Total CK: 73 U/L (ref 38–234)

## 2017-09-17 LAB — SAVE SMEAR

## 2017-09-17 LAB — GLUCOSE, CAPILLARY
Glucose-Capillary: 96 mg/dL (ref 65–99)
Glucose-Capillary: 137 mg/dL — ABNORMAL HIGH (ref 65–99)

## 2017-09-17 LAB — MAGNESIUM: Magnesium: 1.4 mg/dL — ABNORMAL LOW (ref 1.7–2.4)

## 2017-09-17 LAB — BASIC METABOLIC PANEL
ANION GAP: 11 (ref 5–15)
BUN: 42 mg/dL — ABNORMAL HIGH (ref 6–20)
CO2: 37 mmol/L — ABNORMAL HIGH (ref 22–32)
CREATININE: 1.85 mg/dL — AB (ref 0.44–1.00)
Chloride: 84 mmol/L — ABNORMAL LOW (ref 101–111)
GFR calc Af Amer: 35 mL/min — ABNORMAL LOW (ref >60)
GFR, EST NON AFRICAN AMERICAN: 30 mL/min — AB (ref >60)
Glucose, Bld: 119 mg/dL — ABNORMAL HIGH (ref 65–99)
Potassium: 2.8 mmol/L — ABNORMAL LOW (ref 3.5–5.1)
Sodium: 132 mmol/L — ABNORMAL LOW (ref 135–145)

## 2017-09-17 LAB — ALBUMIN: ALBUMIN: 3 g/dL — AB (ref 3.5–5.0)

## 2017-09-17 LAB — SEDIMENTATION RATE: Sed Rate: 84 mm/hr — ABNORMAL HIGH (ref 0–22)

## 2017-09-17 MED ORDER — HYDROCODONE-ACETAMINOPHEN 5-325 MG PO TABS
1.0000 | ORAL_TABLET | ORAL | Status: DC | PRN
  Administered 2017-09-23: 2 via ORAL
  Filled 2017-09-17: qty 2

## 2017-09-17 MED ORDER — ACETAMINOPHEN 650 MG RE SUPP
650.0000 mg | Freq: Four times a day (QID) | RECTAL | Status: DC | PRN
  Administered 2017-09-18: 650 mg via RECTAL
  Filled 2017-09-17: qty 1

## 2017-09-17 MED ORDER — BISACODYL 10 MG RE SUPP: 10.0000 mg | Freq: Every day | RECTAL | Status: DC | PRN

## 2017-09-17 MED ORDER — SODIUM CHLORIDE 0.9 % IV BOLUS: 1000.0000 mL | Freq: Once | INTRAVENOUS | Status: DC

## 2017-09-17 MED ORDER — POTASSIUM CHLORIDE 10 MEQ/100ML IV SOLN
10.0000 meq | INTRAVENOUS | Status: AC
  Administered 2017-09-18 (×4): 10 meq via INTRAVENOUS
  Filled 2017-09-17 (×4): qty 100

## 2017-09-17 MED ORDER — POTASSIUM CHLORIDE CRYS ER 20 MEQ PO TBCR
40.0000 meq | EXTENDED_RELEASE_TABLET | Freq: Once | ORAL | Status: AC
  Administered 2017-09-17: 40 meq via ORAL
  Filled 2017-09-17: qty 2

## 2017-09-17 MED ORDER — FUROSEMIDE 10 MG/ML IJ SOLN
40.0000 mg | Freq: Two times a day (BID) | INTRAMUSCULAR | Status: DC
  Administered 2017-09-17 – 2017-09-18 (×2): 40 mg via INTRAVENOUS
  Filled 2017-09-17 (×2): qty 4

## 2017-09-17 MED ORDER — ONDANSETRON HCL 4 MG/2ML IJ SOLN: 4.0000 mg | Freq: Four times a day (QID) | INTRAMUSCULAR | Status: DC | PRN

## 2017-09-17 MED ORDER — HEPARIN SODIUM (PORCINE) 5000 UNIT/ML IJ SOLN
5000.0000 [IU] | Freq: Three times a day (TID) | INTRAMUSCULAR | Status: DC
  Administered 2017-09-17 – 2017-09-19 (×6): 5000 [IU] via SUBCUTANEOUS
  Filled 2017-09-17 (×6): qty 1

## 2017-09-17 MED ORDER — ONDANSETRON HCL 4 MG PO TABS: 4.0000 mg | ORAL_TABLET | Freq: Four times a day (QID) | ORAL | Status: DC | PRN

## 2017-09-17 MED ORDER — AMLODIPINE BESYLATE 5 MG PO TABS
5.0000 mg | ORAL_TABLET | Freq: Every day | ORAL | Status: DC
  Administered 2017-09-17 – 2017-09-26 (×10): 5 mg via ORAL
  Filled 2017-09-17 (×10): qty 1

## 2017-09-17 MED ORDER — POTASSIUM CHLORIDE 10 MEQ/100ML IV SOLN
10.0000 meq | INTRAVENOUS | Status: DC
Start: 2017-09-17 — End: 2017-09-17
  Administered 2017-09-17: 10 meq via INTRAVENOUS
  Filled 2017-09-17: qty 100

## 2017-09-17 MED ORDER — SODIUM CHLORIDE 0.9 % IV SOLN
INTRAVENOUS | Status: DC
  Administered 2017-09-17 – 2017-09-19 (×6): via INTRAVENOUS
  Administered 2017-09-19: 150 mL/h via INTRAVENOUS
  Administered 2017-09-20 – 2017-09-21 (×5): via INTRAVENOUS

## 2017-09-17 MED ORDER — ACETAMINOPHEN 325 MG PO TABS
650.0000 mg | ORAL_TABLET | Freq: Four times a day (QID) | ORAL | Status: DC | PRN
  Administered 2017-09-25 – 2017-09-26 (×2): 650 mg via ORAL
  Filled 2017-09-17 (×2): qty 2

## 2017-09-17 MED ORDER — POTASSIUM CHLORIDE 10 MEQ/100ML IV SOLN: 10.0000 meq | INTRAVENOUS | Status: DC

## 2017-09-17 MED ORDER — POTASSIUM CHLORIDE 10 MEQ/100ML IV SOLN
10.0000 meq | INTRAVENOUS | Status: AC
  Administered 2017-09-17: 10 meq via INTRAVENOUS
  Filled 2017-09-17: qty 100

## 2017-09-17 MED ORDER — SENNOSIDES-DOCUSATE SODIUM 8.6-50 MG PO TABS
1.0000 | ORAL_TABLET | Freq: Every evening | ORAL | Status: DC | PRN
  Administered 2017-09-26: 1 via ORAL
  Filled 2017-09-17: qty 1

## 2017-09-17 MED ORDER — MAGNESIUM SULFATE IN D5W 1-5 GM/100ML-% IV SOLN
1.0000 g | Freq: Once | INTRAVENOUS | Status: AC
  Administered 2017-09-17: 1 g via INTRAVENOUS
  Filled 2017-09-17: qty 100

## 2017-09-17 MED ORDER — POTASSIUM CHLORIDE 10 MEQ/100ML IV SOLN: 10.0000 meq | Freq: Once | INTRAVENOUS | Status: DC

## 2017-09-17 MED ORDER — SODIUM CHLORIDE 0.9 % IV SOLN
90.0000 mg | Freq: Once | INTRAVENOUS | Status: AC
  Administered 2017-09-17: 90 mg via INTRAVENOUS
  Filled 2017-09-17: qty 10

## 2017-09-17 MED ORDER — LABETALOL HCL 100 MG PO TABS
100.0000 mg | ORAL_TABLET | Freq: Two times a day (BID) | ORAL | Status: DC
  Administered 2017-09-17 – 2017-09-22 (×12): 100 mg via ORAL
  Filled 2017-09-17 (×12): qty 1

## 2017-09-17 NOTE — ED Triage Notes (Signed)
TC to Lab to add on CK and Mag to labs .

## 2017-09-17 NOTE — Consult Note (Addendum)
New Hematology/Oncology Consult   Referral MD: Clementeen Graham      Reason for Referral: Hypercalcemia  HPI: Olivia Patel presented to the emergency room with a one-week history of generalized weakness.  She had a fall this morning.  Her sister reports she has been confused.  She reports pain at the shoulders. In the emergency room she was noted to have severe hypercalcemia and an elevated BUN and creatinine.  Metastatic bone survey revealed lytic lesions in the skull and rib fractures.  A brain CT showed no acute change. She denies pain and fever.    Past Medical History:  Diagnosis Date  . Anemia   . Arthritis   . Hypertension   :  .  G6, P6  Past Surgical History:  Procedure Laterality Date  . TUBAL LIGATION  2006  :   Current Facility-Administered Medications:  .  0.9 %  sodium chloride infusion, , Intravenous, Continuous, Elease Hashimoto, Last Rate: 150 mL/hr at 09/17/17 1636 .  acetaminophen (TYLENOL) tablet 650 mg, 650 mg, Oral, Q6H PRN **OR** acetaminophen (TYLENOL) suppository 650 mg, 650 mg, Rectal, Q6H PRN, Shawn Route, Sara E, PA-C .  amLODipine (NORVASC) tablet 5 mg, 5 mg, Oral, Daily, Wertman, Sara E, PA-C .  bisacodyl (DULCOLAX) suppository 10 mg, 10 mg, Rectal, Daily PRN, Shawn Route, Sara E, PA-C .  furosemide (LASIX) injection 40 mg, 40 mg, Intravenous, BID, Wertman, Sara E, PA-C .  heparin injection 5,000 Units, 5,000 Units, Subcutaneous, Q8H, Wertman, Sara E, PA-C .  HYDROcodone-acetaminophen (NORCO/VICODIN) 5-325 MG per tablet 1-2 tablet, 1-2 tablet, Oral, Q4H PRN, Shawn Route, Sara E, PA-C .  labetalol (NORMODYNE) tablet 100 mg, 100 mg, Oral, BID, Wertman, Sara E, PA-C .  magnesium sulfate IVPB 1 g 100 mL, 1 g, Intravenous, Once, Wertman, Sara E, PA-C .  ondansetron (ZOFRAN) tablet 4 mg, 4 mg, Oral, Q6H PRN **OR** ondansetron (ZOFRAN) injection 4 mg, 4 mg, Intravenous, Q6H PRN, Wertman, Sara E, PA-C .  potassium chloride 10 mEq in 100 mL IVPB, 10 mEq, Intravenous,  Q1 Hr x 4, Wertman, Sara E, PA-C .  potassium chloride 10 mEq in 100 mL IVPB, 10 mEq, Intravenous, Once, Lady Deutscher, MD .  senna-docusate (Senokot-S) tablet 1 tablet, 1 tablet, Oral, QHS PRN, Rondel Jumbo, PA-C .  sodium chloride 0.9 % bolus 1,000 mL, 1,000 mL, Intravenous, Once, Quentin Cornwall, [place] N, PA-C:  . amLODipine  5 mg Oral Daily  . furosemide  40 mg Intravenous BID  . heparin  5,000 Units Subcutaneous Q8H  . labetalol  100 mg Oral BID  :  No Known Allergies:  FH: A maternal great aunt had bladder cancer.  No other family history of cancer  SOCIAL HISTORY: She lives with 2 daughters in [place].  She is disabled secondary to arthritis.  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  No transfusion history.  Review of Systems:  Positives include: Anorexia, 13 pound weight loss, generalized weakness, shoulder pain  A complete ROS was otherwise negative.   Physical Exam:  Blood pressure (!) 166/96, pulse (!) 109, temperature 98.5 F (36.9 C), temperature source Oral, resp. rate 18, height 5' 3" (1.6 m), weight 193 lb (87.5 kg), last menstrual period 05/29/2015, SpO2 93 %.  HEENT: No thrush, neck without mass Lungs: Distant breath sounds, no respiratory distress Cardiac: Regular rate and rhythm Abdomen: No hepatosplenomegaly, nontender  Vascular: No leg edema Lymph nodes: No cervical, supraclavicular, axillary, or  inguinal nodes Neurologic: Alert and oriented, follows commands, the motor exam appears intact in the upper and lower extremities Skin: No rash Musculoskeletal: No spine tenderness  LABS:  Recent Labs    09/17/17 1118  WBC 16.3*  HGB 12.0  HCT 36.5  PLT 344    Recent Labs    09/17/17 1118  NA 132*  K 2.8*  CL 84*  CO2 37*  GLUCOSE 119*  BUN 42*  CREATININE 1.85*  CALCIUM >15.0*      RADIOLOGY:  Ct Head Wo Contrast  Result Date: 09/17/2017 CLINICAL DATA:  Syncope  EXAM: CT HEAD WITHOUT CONTRAST TECHNIQUE: Contiguous axial images were obtained from the base of the skull through the vertex without intravenous contrast. COMPARISON:  None. FINDINGS: Brain: There is low-density in the periventricular white matter, adjacent to the frontal horns, in the left frontal lobe subcortical white matter, as well as the bilateral parietal lobes. No mass effect, midline shift, or acute hemorrhage. Ventricular system is within normal limits. Vascular: No hyperdense vessel or unexpected calcification. Skull: Cranium is intact. Sinuses/Orbits: There is mucous material in the right maxillary sinus. Mastoid air cells and remainder of the visualized paranasal sinuses are clear. Orbits are within normal limits. Other: Noncontributory IMPRESSION: There is patchy low density in the supratentorial white matter most likely related to chronic ischemic changes of small vessel disease. No acute intracranial pathology. Electronically Signed   By: Marybelle Killings M.D.   On: 09/17/2017 12:48   Dg Bone Survey Met  Result Date: 09/17/2017 CLINICAL DATA:  Hypercalcemia. EXAM: METASTATIC BONE SURVEY COMPARISON:  None. FINDINGS: Multiple rounded lucencies are noted in the skull concerning for lytic lesions. Mildly displaced third right rib fracture is noted. Lytic lesion is seen involving the lateral portion of the right seventh rib. Probable pathologic fracture is seen involving the lateral portion of the left seventh rib. No other abnormality is seen in the visualized skeleton. IMPRESSION: Multiple rounded lucencies are noted in the skull concerning for lytic lesions related to multiple myeloma. Also noted are bilateral lytic lesions in the ribs. Electronically Signed   By: Marijo Conception, M.D.   On: 09/17/2017 15:08    Assessment and Plan:   1.  Hypercalcemia 2.  Dehydration 3.  Lytic bone lesions, rib fractures 4.  Hypertension  Olivia Patel presents with failure to thrive.  She has severe  hypercalcemia and dehydration.  The clinical presentation is consistent with malignant hypercalcemia, most likely due to multiple myeloma.  I discussed the probable diagnosis with Olivia Patel and her sister.  We will work on correcting the hypercalcemia over the next few days and then establish a diagnosis with a bone marrow biopsy.  I will initiate a laboratory work-up for multiple myeloma.  Recommendations: 1.  Continue intravenous hydration 2.  Pamidronate 3.  Myeloma panel 4.  Schedule bone marrow biopsy for 09/20/2017 5.  Add calcitonin if the calcium does not improve tomorrow or if she develops progressive mental status changes  I discussed the risk of permit any therapy with Olivia Patel and her sister.  This included a discussion of osteonecrosis.  She agrees to proceed. I will continue following Olivia Patel in the hospital and we will arrange outpatient follow-up at the Cancer center.  Betsy Coder, MD 09/17/2017, 5:17 PM

## 2017-09-17 NOTE — H&P (Addendum)
History and Physical    Olivia Patel BWG:[id number] DOB: 1960-12-04 DOA: 09/17/2017   PCP: Lance Sell, NP   Patient coming from:  Home    Chief Complaint: Generalized weakness  HPI: Olivia Patel is a 57 y.o. female with medical history significant for HTN, anemia, arthritis, brought to the emergency department, with complaints of progressive, one-week history of generalized weakness and fall that occurred this morning.  Patient did not lose consciousness, but did hit her head on the dresser.  Daughter reports that the patient was slightly confused following the fall, but she is not her baseline.  She had recently been seen by her PCP, who prescribed Robaxin and diclofenac for "a pulled muscle ".  Daughter reports that after beginning taking those medications, her speech seemed slowed.  Over the last 3 days, the daughter also reports that she did not notice her mother taking her blood pressure medications.  She denies any vision changes, headache, nausea, vomiting, or significant bone pain.  She denies any recent fractures.  She does have decreased concentration as mentioned above, but no stupor.  She does admit to polydipsia and polyuria.  She denies any history of renal insufficiency or stones.  No history of osteoporosis.  Is any chest pain or palpitations.  She denies any increasing shortness of breath, or cough.  No recent infections.  No sick contacts.  No recent long distance travels.  Denies a history of CVA.  She is not on anticoagulation, or takes any aspirin.  ED Course:  BP (!) 147/100   Pulse (!) 101   Temp 98.5 F (36.9 C) (Oral)   Resp (!) 0   Ht '5\' 3"'$  (1.6 m)   Wt 87.5 kg (193 lb)   LMP 05/29/2015   SpO2 93%   BMI 34.19 kg/m   Sodium 132, potassium 2.8, chloride 84, bicarb 37, glucose 119, BUN 42, creatinine 1.85, calcium greater than 15, as of 08/31/2017 was 10, anion gap 11, magnesium 1.4. CK 73 White count 16.3, hemoglobin 12, platelets 344, of note, there  are absolute immature granulocytes of 0.5. Urine with small leukocytes, negative nitrites. CT of the head negative for acute intracranial pathology Sinus tachycardia Borderline left axis deviation EKG shows borderline T wave abnormalities, U waves present. NO STEMI   Review of Systems:  As per HPI otherwise all other systems reviewed and are negative  Past Medical History:  Diagnosis Date  . Anemia   . Arthritis   . Hypertension     Past Surgical History:  Procedure Laterality Date  . TUBAL LIGATION      Social History Social History   Socioeconomic History  . Marital status: Single    Spouse name: Not on file  . Number of children: 6  . Years of education: 59  . Highest education level: Not on file  Occupational History  . Occupation: Disability  Social Needs  . Financial resource strain: Not on file  . Food insecurity:    Worry: Not on file    Inability: Not on file  . Transportation needs:    Medical: Not on file    Non-medical: Not on file  Tobacco Use  . Smoking status: Never Smoker  . Smokeless tobacco: Never Used  Substance and Sexual Activity  . Alcohol use: No    Alcohol/week: 0.0 oz  . Drug use: No  . Sexual activity: Never    Partners: Male  Lifestyle  . Physical activity:    Days per week: Not  on file    Minutes per session: Not on file  . Stress: Not on file  Relationships  . Social connections:    Talks on phone: Not on file    Gets together: Not on file    Attends religious service: Not on file    Active member of club or organization: Not on file    Attends meetings of clubs or organizations: Not on file    Relationship status: Not on file  . Intimate partner violence:    Fear of current or ex partner: Not on file    Emotionally abused: Not on file    Physically abused: Not on file    Forced sexual activity: Not on file  Other Topics Concern  . Not on file  Social History Narrative  . Not on file     No Known  Allergies  Family History  Problem Relation Age of Onset  . Hypertension Mother   . Diabetes Mother   . Diabetes Sister   . Hypertension Father   . Kidney failure Father       Prior to Admission medications   Medication Sig Start Date End Date Taking? Authorizing Provider  amLODipine (NORVASC) 5 MG tablet Take 1 tablet (5 mg total) by mouth daily. 07/20/17  Yes Lance Sell, NP  diclofenac (VOLTAREN) 75 MG EC tablet Take 1 tablet (75 mg total) by mouth 2 (two) times daily. 09/14/17  Yes Wieters, Hallie C, PA-C  labetalol (NORMODYNE) 100 MG tablet Take 1 tablet (100 mg total) by mouth 2 (two) times daily. Must keep appt w/new provider for refills 06/06/17  Yes Shambley, Delphia Grates, NP  methocarbamol (ROBAXIN) 500 MG tablet Take 1 tablet (500 mg total) by mouth every 8 (eight) hours as needed for muscle spasms. 08/29/17  Yes Marrian Salvage, FNP  predniSONE (DELTASONE) 50 MG tablet Take 1 tablet (50 mg total) by mouth daily for 5 days. 09/14/17 09/19/17 Yes Wieters, Hallie C, PA-C  hydrochlorothiazide (HYDRODIURIL) 25 MG tablet Take 1 tablet (25 mg total) by mouth daily. Patient not taking: Reported on 09/17/2017 07/07/17   Lance Sell, NP    Physical Exam:  Vitals:   09/17/17 1130 09/17/17 1145 09/17/17 1200 09/17/17 1215  BP: (!) 169/88 (!) 155/99 (!) 157/108 (!) 147/100  Pulse: (!) 103 (!) 104 (!) 102 (!) 101  Resp: (!) 39 (!) 27 (!) 6 (!) 0  Temp:      TempSrc:      SpO2: 91% 95% 91% 93%  Weight:      Height:       Constitutional: NAD, calm, comfortable , weak appearing.   Eyes: PERRL, lids and conjunctivae normal ENMT: Mucous membranes are moist, without exudate or lesions  Neck: normal, supple, no masses, no thyromegaly Respiratory: clear to auscultation bilaterally, no wheezing, no crackles. Normal respiratory effort  Cardiovascular: Regular rate and rhythm,  murmur, rubs or gallops. No extremity edema. 2+ pedal pulses. No carotid bruits.  Abdomen: Soft,  non tender, No hepatosplenomegaly. Bowel sounds positive.  Musculoskeletal: no clubbing / cyanosis. Moves all extremities Skin: no jaundice, No lesions.  Neurologic: Sensation intact  Strength equal in all extremities Psychiatric:   Alert and oriented x 3.  Somewhat lethargic mood, but she is able to cooperate with exam.    Labs on Admission: I have personally reviewed following labs and imaging studies  CBC: Recent Labs  Lab 09/17/17 1118  WBC 16.3*  NEUTROABS 13.3*  HGB 12.0  HCT 36.5  MCV 84.5  PLT 562    Basic Metabolic Panel: Recent Labs  Lab 09/17/17 1118  NA 132*  K 2.8*  CL 84*  CO2 37*  GLUCOSE 119*  BUN 42*  CREATININE 1.85*  CALCIUM >15.0*  MG 1.4*    GFR: Estimated Creatinine Clearance: 36.4 mL/min (A) (by C-G formula based on SCr of 1.85 mg/dL (H)).  Liver Function Tests: No results for input(s): AST, ALT, ALKPHOS, BILITOT, PROT, ALBUMIN in the last 168 hours. No results for input(s): LIPASE, AMYLASE in the last 168 hours. No results for input(s): AMMONIA in the last 168 hours.  Coagulation Profile: No results for input(s): INR, PROTIME in the last 168 hours.  Cardiac Enzymes: Recent Labs  Lab 09/17/17 1118  CKTOTAL 73    BNP (last 3 results) No results for input(s): PROBNP in the last 8760 hours.  HbA1C: No results for input(s): HGBA1C in the last 72 hours.  CBG: No results for input(s): GLUCAP in the last 168 hours.  Lipid Profile: No results for input(s): CHOL, HDL, LDLCALC, TRIG, CHOLHDL, LDLDIRECT in the last 72 hours.  Thyroid Function Tests: No results for input(s): TSH, T4TOTAL, FREET4, T3FREE, THYROIDAB in the last 72 hours.  Anemia Panel: No results for input(s): VITAMINB12, FOLATE, FERRITIN, TIBC, IRON, RETICCTPCT in the last 72 hours.  Urine analysis:    Component Value Date/Time   COLORURINE YELLOW 09/17/2017 1223   APPEARANCEUR HAZY (A) 09/17/2017 1223   LABSPEC 1.011 09/17/2017 1223   PHURINE 6.0 09/17/2017  1223   GLUCOSEU NEGATIVE 09/17/2017 1223   HGBUR SMALL (A) 09/17/2017 1223   BILIRUBINUR NEGATIVE 09/17/2017 1223   [place] 09/17/2017 1223   PROTEINUR NEGATIVE 09/17/2017 1223   NITRITE NEGATIVE 09/17/2017 1223   LEUKOCYTESUR SMALL (A) 09/17/2017 1223    Sepsis Labs: '@LABRCNTIP'$ (procalcitonin:4,lacticidven:4) )No results found for this or any previous visit (from the past 240 hour(s)).   Radiological Exams on Admission: Ct Head Wo Contrast  Result Date: 09/17/2017 CLINICAL DATA:  Syncope EXAM: CT HEAD WITHOUT CONTRAST TECHNIQUE: Contiguous axial images were obtained from the base of the skull through the vertex without intravenous contrast. COMPARISON:  None. FINDINGS: Brain: There is low-density in the periventricular white matter, adjacent to the frontal horns, in the left frontal lobe subcortical white matter, as well as the bilateral parietal lobes. No mass effect, midline shift, or acute hemorrhage. Ventricular system is within normal limits. Vascular: No hyperdense vessel or unexpected calcification. Skull: Cranium is intact. Sinuses/Orbits: There is mucous material in the right maxillary sinus. Mastoid air cells and remainder of the visualized paranasal sinuses are clear. Orbits are within normal limits. Other: Noncontributory IMPRESSION: There is patchy low density in the supratentorial white matter most likely related to chronic ischemic changes of small vessel disease. No acute intracranial pathology. Electronically Signed   By: Marybelle Killings M.D.   On: 09/17/2017 12:48   Dg Bone Survey Met  Result Date: 09/17/2017 CLINICAL DATA:  Hypercalcemia. EXAM: METASTATIC BONE SURVEY COMPARISON:  None. FINDINGS: Multiple rounded lucencies are noted in the skull concerning for lytic lesions. Mildly displaced third right rib fracture is noted. Lytic lesion is seen involving the lateral portion of the right seventh rib. Probable pathologic fracture is seen involving the lateral portion of  the left seventh rib. No other abnormality is seen in the visualized skeleton. IMPRESSION: Multiple rounded lucencies are noted in the skull concerning for lytic lesions related to multiple myeloma. Also noted are bilateral lytic lesions in the ribs. Electronically Signed  By: Marijo Conception, M.D.   On: 09/17/2017 15:08    EKG: Independently reviewed.  Assessment/Plan Principal Problem:   Hypercalcemia Active Problems:   Arthritis   Essential hypertension   Anemia   AKI (acute kidney injury) ([place])   Hypokalemia    Symptomatic hypercalcemia, unclear etiology, rule out malignancy.  Recently, the patient had "significant arthritic pain ".  She also presents with acute kidney injury, with a creatinine of 1.85, sodium 132.  Hemoglobin is 12, normal platelets.  She denies taking diuretics.  Current calcium is 16.  EKG showed sinus tachycardia, borderline T wave abnormalities, U waves present.  No ST EMI.  Patient denies any history of multiple myeloma.  Received 1 L of IV fluids.Metastatic bone survey with multiple lucencies consistent with multiple myeloma  inpatient telemetry Hold  supplemental calcium    Aggressive IVF, and diuresis Recheck calcium later today and , and BMET in am Myeloma panel, UPEP with immunofixation, light chains, total protein, 24-hour urine, SPEP, sed rate, beta-2 microglobulin, peripheral blood smear for rouleaux.   will need hematology evaluation, for bone marrow biopsy, which could be performed at the hospital, but the patient does not need to stay in the hospital for the results, and can see a hematologist as an outpatient for management of the disease.    Hypokalemia, may be due to diuresis. K was 2.8, received 40 Kdur oral, 20 KCL shows borderline T wave abnormalities, U waves present. NO STEMI Check Mg Repeat CMET in am Replenish another 40 meq in anticipation of further aggressive  diuresis    Acute Kidney Injury likely due to polyuria, no history of kidney  disease BL 1 Lab Results  Component Value Date   CREATININE 1.85 (H) 09/17/2017   CREATININE 0.84 08/31/2017   CREATININE 1.00 06/06/2017  IVF BMET in am  Hold Ace inhibitors or NSAIDS   Arthritis Continue pain control with Norco , avoid NSAIDS    Hypertension BP  147/100   Pulse  101  , taken before her a.m meds  Continue home anti-hypertensive medications with Norvasc and labetalol   DVT prophylaxis:  Hep sq  Code Status:    [place]  Family Communication:  Discussed with patient Disposition Plan: Expect patient to be discharged to home after condition improves Consults called:    NOne  Admission status: IP tele    Sharene Butters, PA-C [facility]   Amion text  [phone]   09/17/2017, 3:20 PM

## 2017-09-17 NOTE — Progress Notes (Signed)
New Admission Note:   Arrival Method: Bed Mental Orientation: A&O X4 Telemetry: Initiated Assessment: Completed Skin:WDL IV:WDL Pain: Denies Safety Measures: Safety Fall Prevention Plan has been given, discussed and signed Admission: Completed Unit Orientation: Patient has been orientated to the room, unit and staff.  Family: Sister and daughter at bedside  Orders have been reviewed and implemented. Will continue to monitor the patient. Call light has been placed within reach and bed alarm has been activated.    Dixie Dials RN, BSN

## 2017-09-17 NOTE — ED Triage Notes (Signed)
Pt from Home and arrived via EMS. Pt has been staying with Daughter and has not taken BP meds in 3 days. Pt went to [facility]  And received  Rx for pain and prednisone . Pt was walking to [place] this AM and became weak and fell into dresser. Son -N-Law assisted PT to chair.

## 2017-09-17 NOTE — ED Provider Notes (Signed)
[place] EMERGENCY DEPARTMENT Provider Note   CSN: [id number] Arrival date & time: 09/17/17  9892     History   Chief Complaint Chief Complaint  Patient presents with  . Hypertension  . Weakness    HPI Olivia Patel is a 57 y.o. female w PMHx HTN, anemia, arthritis, presenting to the ED with complaints of generalized weakness and fall that occurred this morning.  She states she felt as though her legs were overall weak and caused her to fall.  She is states she hit the right side of her head on the dresser, without LOC. Her daughter reports she was slightly confused following the fall, though is at her baseline now. She has been feeling overall weak for the last week or so.  Patient's daughter states she pulled a back muscle couple of weeks ago and has been fairly immobile since that time.  She states she was recently evaluated by her primary care and urgent care, who prescribed her diclofenac and Robaxin for symptoms.  Patient's daughter states since she began taking those medications her speech seems slowed.  Her daughter states urgent care encouraged her to ambulate to avoid deconditioning and muscle stiffness, therefore she has been staying with her daughter for the past 3 days.  Patient reports she has not taken her blood pressure medication in 3 days, since she left her blood pressure medications at her house.  Denies vision changes, headache, chest pain, abdominal pain, urinary symptoms, numbness or weakness in extremities, fevers, upper respiratory symptoms.  Not on anticoagulation. No hx CVA.  The history is provided by the patient and a relative.    Past Medical History:  Diagnosis Date  . Anemia   . Arthritis   . Hypertension     Patient Active Problem List   Diagnosis Date Noted  . Anemia 09/17/2017  . Hypercalcemia 09/17/2017  . Rotator cuff tendinitis 08/11/2016  . Essential hypertension 09/17/2015  . Arthritis 10/09/2012    Past Surgical  History:  Procedure Laterality Date  . TUBAL LIGATION       OB History    Gravida  6   Para  6   Term  6   Preterm      AB      Living  6     SAB      TAB      Ectopic      Multiple      Live Births  6            Home Medications    Prior to Admission medications   Medication Sig Start Date End Date Taking? Authorizing Provider  amLODipine (NORVASC) 5 MG tablet Take 1 tablet (5 mg total) by mouth daily. 07/20/17  Yes Lance Sell, NP  diclofenac (VOLTAREN) 75 MG EC tablet Take 1 tablet (75 mg total) by mouth 2 (two) times daily. 09/14/17  Yes Wieters, Hallie C, PA-C  labetalol (NORMODYNE) 100 MG tablet Take 1 tablet (100 mg total) by mouth 2 (two) times daily. Must keep appt w/new provider for refills 06/06/17  Yes Shambley, Delphia Grates, NP  methocarbamol (ROBAXIN) 500 MG tablet Take 1 tablet (500 mg total) by mouth every 8 (eight) hours as needed for muscle spasms. 08/29/17  Yes Marrian Salvage, FNP  predniSONE (DELTASONE) 50 MG tablet Take 1 tablet (50 mg total) by mouth daily for 5 days. 09/14/17 09/19/17 Yes Wieters, Hallie C, PA-C  hydrochlorothiazide (HYDRODIURIL) 25 MG tablet Take 1 tablet (  25 mg total) by mouth daily. Patient not taking: Reported on 09/17/2017 07/07/17   Lance Sell, NP    Family History Family History  Problem Relation Age of Onset  . Hypertension Mother   . Diabetes Mother   . Diabetes Sister   . Hypertension Father   . Kidney failure Father     Social History Social History   Tobacco Use  . Smoking status: Never Smoker  . Smokeless tobacco: Never Used  Substance Use Topics  . Alcohol use: No    Alcohol/week: 0.0 oz  . Drug use: No     Allergies   Patient has no known allergies.   Review of Systems Review of Systems  Constitutional: Negative for appetite change, chills and fever.  Eyes: Negative for photophobia and visual disturbance.  Respiratory: Negative for shortness of breath.     Cardiovascular: Negative for chest pain.  Gastrointestinal: Negative for abdominal pain.  Genitourinary: Negative for dysuria and frequency.  Musculoskeletal: Positive for back pain.  Skin: Negative for wound.  Neurological: Positive for light-headedness. Negative for syncope, facial asymmetry, weakness, numbness and headaches.  Hematological: Does not bruise/bleed easily.  All other systems reviewed and are negative.    Physical Exam Updated Vital Signs BP (!) 147/100   Pulse (!) 101   Temp 98.5 F (36.9 C) (Oral)   Resp (!) 0   Ht 5\' 3"  (1.6 m)   Wt 87.5 kg (193 lb)   LMP 05/29/2015   SpO2 93%   BMI 34.19 kg/m   Physical Exam  Constitutional: She is oriented to person, place, and time. She appears well-developed and well-nourished. No distress.  HENT:  Head: Normocephalic and atraumatic.  Eyes: Pupils are equal, round, and reactive to light. Conjunctivae and EOM are normal.  Neck: Normal range of motion. Neck supple.  Cardiovascular: Normal rate, regular rhythm and intact distal pulses.  Pulmonary/Chest: Effort normal and breath sounds normal. No respiratory distress.  Abdominal: Soft. Bowel sounds are normal. She exhibits no distension and no mass. There is no tenderness. There is no rebound and no guarding.  Neurological: She is alert and oriented to person, place, and time.  Mental Status:  Alert, oriented, thought content appropriate, able to give a coherent history. Speech fluent without evidence of aphasia. Able to follow 2 step commands without difficulty.  Cranial Nerves:  II:  Peripheral visual fields grossly normal, pupils equal, round, reactive to light III,IV, VI: ptosis not present, extra-ocular motions intact bilaterally  V,VII: smile symmetric, facial light touch sensation equal VIII: hearing grossly normal to voice  X: uvula elevates symmetrically  XI: bilateral shoulder shrug symmetric and strong XII: midline tongue extension without  fassiculations Motor:  Normal tone. 5/5 in upper and lower extremities bilaterally including strong and equal grip strength and dorsiflexion/plantar flexion Sensory: Pinprick and light touch normal in all extremities.  Deep Tendon Reflexes: 2+ and symmetric in the biceps and patella Cerebellar: normal finger-to-nose with bilateral upper extremities Gait: unable to assess gait 2/t patient's back pain. CV: distal pulses palpable throughout    Skin: Skin is warm.  Psychiatric: She has a normal mood and affect. Her behavior is normal.  Nursing note and vitals reviewed.    ED Treatments / Results  Labs (all labs ordered are listed, but only abnormal results are displayed) Labs Reviewed  CBC WITH DIFFERENTIAL/PLATELET - Abnormal; Notable for the following components:      Result Value   WBC 16.3 (*)    Neutro Abs 13.3 (*)  Abs Immature Granulocytes 0.5 (*)    All other components within normal limits  BASIC METABOLIC PANEL - Abnormal; Notable for the following components:   Sodium 132 (*)    Potassium 2.8 (*)    Chloride 84 (*)    CO2 37 (*)    Glucose, Bld 119 (*)    BUN 42 (*)    Creatinine, Ser 1.85 (*)    Calcium >15.0 (*)    GFR calc non Af Amer 30 (*)    GFR calc Af Amer 35 (*)    All other components within normal limits  URINALYSIS, ROUTINE W REFLEX MICROSCOPIC - Abnormal; Notable for the following components:   APPearance HAZY (*)    Hgb urine dipstick SMALL (*)    Leukocytes, UA SMALL (*)    All other components within normal limits  MAGNESIUM - Abnormal; Notable for the following components:   Magnesium 1.4 (*)    All other components within normal limits  CK  CALCIUM, IONIZED  HIV ANTIBODY (ROUTINE TESTING)  BASIC METABOLIC PANEL  SAVE SMEAR    EKG EKG Interpretation  Date/Time:  Saturday Sep 17 2017 10:41:08 EDT Ventricular Rate:  103 PR Interval:    QRS Duration: 102 QT Interval:  352 QTC Calculation: 461 R Axis:   -27 Text Interpretation:   Sinus tachycardia Borderline left axis deviation Borderline T wave abnormalities U waves present NO STEMI NOT Confirmed by Addison Lank [phone]) on 09/17/2017 12:19:15 PM   Radiology Ct Head Wo Contrast  Result Date: 09/17/2017 CLINICAL DATA:  Syncope EXAM: CT HEAD WITHOUT CONTRAST TECHNIQUE: Contiguous axial images were obtained from the base of the skull through the vertex without intravenous contrast. COMPARISON:  None. FINDINGS: Brain: There is low-density in the periventricular white matter, adjacent to the frontal horns, in the left frontal lobe subcortical white matter, as well as the bilateral parietal lobes. No mass effect, midline shift, or acute hemorrhage. Ventricular system is within normal limits. Vascular: No hyperdense vessel or unexpected calcification. Skull: Cranium is intact. Sinuses/Orbits: There is mucous material in the right maxillary sinus. Mastoid air cells and remainder of the visualized paranasal sinuses are clear. Orbits are within normal limits. Other: Noncontributory IMPRESSION: There is patchy low density in the supratentorial white matter most likely related to chronic ischemic changes of small vessel disease. No acute intracranial pathology. Electronically Signed   By: Marybelle Killings M.D.   On: 09/17/2017 12:48    Procedures Procedures (including critical care time) CRITICAL CARE Performed by: [place] N Pax Reasoner   Total critical care time: 45 minutes  Critical care time was exclusive of separately billable procedures and treating other patients.  Critical care was necessary to treat or prevent imminent or life-threatening deterioration.  Critical care was time spent personally by me on the following activities: development of treatment plan with patient and/or surrogate as well as nursing, discussions with consultants, evaluation of patient's response to treatment, examination of patient, obtaining history from patient or surrogate, ordering and performing  treatments and interventions, ordering and review of laboratory studies, ordering and review of radiographic studies, pulse oximetry and re-evaluation of patient's condition.   Medications Ordered in ED Medications  potassium chloride 10 mEq in 100 mL IVPB (10 mEq Intravenous New Bag/Given 09/17/17 1254)  sodium chloride 0.9 % bolus 1,000 mL (has no administration in time range)  magnesium sulfate IVPB 1 g 100 mL (has no administration in time range)  labetalol (NORMODYNE) tablet 100 mg (has no administration in time range)  0.9 %  sodium chloride infusion (has no administration in time range)  acetaminophen (TYLENOL) tablet 650 mg (has no administration in time range)    Or  acetaminophen (TYLENOL) suppository 650 mg (has no administration in time range)  HYDROcodone-acetaminophen (NORCO/VICODIN) 5-325 MG per tablet 1-2 tablet (has no administration in time range)  senna-docusate (Senokot-S) tablet 1 tablet (has no administration in time range)  bisacodyl (DULCOLAX) suppository 10 mg (has no administration in time range)  ondansetron (ZOFRAN) tablet 4 mg (has no administration in time range)    Or  ondansetron (ZOFRAN) injection 4 mg (has no administration in time range)  heparin injection 5,000 Units (has no administration in time range)  amLODipine (NORVASC) tablet 5 mg (has no administration in time range)  furosemide (LASIX) injection 40 mg (has no administration in time range)  potassium chloride 10 mEq in 100 mL IVPB (has no administration in time range)  potassium chloride SA (K-DUR,KLOR-CON) CR tablet 40 mEq (40 mEq Oral Given 09/17/17 1254)     Initial Impression / Assessment and Plan / ED Course  I have reviewed the triage vital signs and the nursing notes.  Pertinent labs & imaging results that were available during my care of the patient were reviewed by me and considered in my medical decision making (see chart for details).  Clinical Course as of Sep 17 1425  Sat Sep 17, 2017  1347 [facility] accepting admission   [JR]    Clinical Course User Index [JR] Lambert Jeanty, [place] N, PA-C    Pt presenting to the ED with generalized weakness and fall this morning. Fall with head trauma, no LOC. Recently less mobile x2weeks due to back strain. Pt reporting generalized fatigue with episode of confusion following fall today. ON exam in ED, no neuro deficits. Pt is hypertensive though has not taken medications x3days. BMP with significant abnormalities; calcium greater than 15, mild hyponatremic, hypokalemic at 2.8, AKI creatinine 1.85.  CBC with leukocytosis, however patient has been taking prednisone since Wednesday; which is likely causing elevation.  Magnesium slightly low 1.4.  CK within normal limits.  IV potassium, magnesium, and fluids given.  Ionized calcium pending.  CT head negative.  EKG with u-wave abnormality, consistent with hypokalemia. Patient will require admission for significant electrolyte abnormalities; significant hypercalcemia is likely the cause of patient's generalized weakness and intermittent altered sensorium.  Dr. Evangeline Gula accepting admission.  Patient discussed with and seen by Dr. Leonette Monarch.  The patient appears reasonably stabilized for admission considering the current resources, flow, and capabilities available in the ED at this time, and I doubt any other [facility] requiring further screening and/or treatment in the ED prior to admission.  Final Clinical Impressions(s) / ED Diagnoses   Final diagnoses:  Hypercalcemia  Hypokalemia  Hypomagnesemia  AKI (acute kidney injury) [facility])    ED Discharge Orders    None       Sueo Cullen, [place] N, PA-C 09/17/17 1445    Fatima Blank, MD 09/17/17 [phone]

## 2017-09-18 ENCOUNTER — Inpatient Hospital Stay (HOSPITAL_COMMUNITY): Payer: Medicare Other

## 2017-09-18 DIAGNOSIS — R928 Other abnormal and inconclusive findings on diagnostic imaging of breast: Secondary | ICD-10-CM

## 2017-09-18 DIAGNOSIS — M1990 Unspecified osteoarthritis, unspecified site: Secondary | ICD-10-CM

## 2017-09-18 DIAGNOSIS — R509 Fever, unspecified: Secondary | ICD-10-CM

## 2017-09-18 LAB — BASIC METABOLIC PANEL
ANION GAP: 11 (ref 5–15)
ANION GAP: 12 (ref 5–15)
Anion gap: 13 (ref 5–15)
BUN: 42 mg/dL — AB (ref 6–20)
BUN: 41 mg/dL — ABNORMAL HIGH (ref 6–20)
BUN: 44 mg/dL — ABNORMAL HIGH (ref 6–20)
CO2: 34 mmol/L — ABNORMAL HIGH (ref 22–32)
CO2: 32 mmol/L (ref 22–32)
CO2: 30 mmol/L (ref 22–32)
CREATININE: 1.84 mg/dL — AB (ref 0.44–1.00)
Chloride: 84 mmol/L — ABNORMAL LOW (ref 101–111)
Chloride: 92 mmol/L — ABNORMAL LOW (ref 101–111)
Chloride: 94 mmol/L — ABNORMAL LOW (ref 101–111)
Creatinine, Ser: 2.02 mg/dL — ABNORMAL HIGH (ref 0.44–1.00)
Creatinine, Ser: 2.18 mg/dL — ABNORMAL HIGH (ref 0.44–1.00)
GFR calc Af Amer: 35 mL/min — ABNORMAL LOW (ref >60)
GFR calc non Af Amer: 27 mL/min — ABNORMAL LOW (ref >60)
GFR, EST AFRICAN AMERICAN: 31 mL/min — AB (ref >60)
GFR, EST AFRICAN AMERICAN: 28 mL/min — AB (ref >60)
GFR, EST NON AFRICAN AMERICAN: 30 mL/min — AB (ref >60)
GFR, EST NON AFRICAN AMERICAN: 24 mL/min — AB (ref >60)
GLUCOSE: 135 mg/dL — AB (ref 65–99)
Glucose, Bld: 112 mg/dL — ABNORMAL HIGH (ref 65–99)
Glucose, Bld: 111 mg/dL — ABNORMAL HIGH (ref 65–99)
POTASSIUM: 3.4 mmol/L — AB (ref 3.5–5.1)
Potassium: 3.4 mmol/L — ABNORMAL LOW (ref 3.5–5.1)
Potassium: 3.3 mmol/L — ABNORMAL LOW (ref 3.5–5.1)
SODIUM: 131 mmol/L — AB (ref 135–145)
SODIUM: 135 mmol/L (ref 135–145)
Sodium: 136 mmol/L (ref 135–145)

## 2017-09-18 LAB — COMPREHENSIVE METABOLIC PANEL
ALBUMIN: 2.7 g/dL — AB (ref 3.5–5.0)
ALBUMIN: 2.6 g/dL — AB (ref 3.5–5.0)
ALT: 23 U/L (ref 14–54)
ALT: 25 U/L (ref 14–54)
AST: 32 U/L (ref 15–41)
AST: 34 U/L (ref 15–41)
Alkaline Phosphatase: 113 U/L (ref 38–126)
Alkaline Phosphatase: 106 U/L (ref 38–126)
Anion gap: 11 (ref 5–15)
Anion gap: 13 (ref 5–15)
BILIRUBIN TOTAL: 1 mg/dL (ref 0.3–1.2)
BUN: 40 mg/dL — ABNORMAL HIGH (ref 6–20)
BUN: 44 mg/dL — AB (ref 6–20)
CHLORIDE: 92 mmol/L — AB (ref 101–111)
CO2: 33 mmol/L — AB (ref 22–32)
CO2: 27 mmol/L (ref 22–32)
CREATININE: 2.32 mg/dL — AB (ref 0.44–1.00)
Calcium: >15 mg/dL (ref 8.9–10.3)
Calcium: 14.5 mg/dL (ref 8.9–10.3)
Chloride: 93 mmol/L — ABNORMAL LOW (ref 101–111)
Creatinine, Ser: 1.93 mg/dL — ABNORMAL HIGH (ref 0.44–1.00)
GFR calc Af Amer: 26 mL/min — ABNORMAL LOW (ref >60)
GFR calc non Af Amer: 28 mL/min — ABNORMAL LOW (ref >60)
GFR calc non Af Amer: 23 mL/min — ABNORMAL LOW (ref >60)
GFR, EST AFRICAN AMERICAN: 33 mL/min — AB (ref >60)
GLUCOSE: 107 mg/dL — AB (ref 65–99)
GLUCOSE: 133 mg/dL — AB (ref 65–99)
POTASSIUM: 3.5 mmol/L (ref 3.5–5.1)
POTASSIUM: 3.1 mmol/L — AB (ref 3.5–5.1)
SODIUM: 137 mmol/L (ref 135–145)
SODIUM: 132 mmol/L — AB (ref 135–145)
TOTAL PROTEIN: 6.8 g/dL (ref 6.5–8.1)
Total Bilirubin: 1.3 mg/dL — ABNORMAL HIGH (ref 0.3–1.2)
Total Protein: 6.5 g/dL (ref 6.5–8.1)

## 2017-09-18 LAB — BLOOD GAS, ARTERIAL
Acid-Base Excess: 10.2 mmol/L — ABNORMAL HIGH (ref 0.0–2.0)
Bicarbonate: 33.3 mmol/L — ABNORMAL HIGH (ref 20.0–28.0)
DRAWN BY: 213381
O2 CONTENT: 2 L/min
O2 Saturation: 94.7 %
PATIENT TEMPERATURE: 101.3
pCO2 arterial: 39.1 mmHg (ref 32.0–48.0)
pH, Arterial: 7.545 — ABNORMAL HIGH (ref 7.350–7.450)
pO2, Arterial: 76.2 mmHg — ABNORMAL LOW (ref 83.0–108.0)

## 2017-09-18 LAB — CBC
HCT: 34.1 % — ABNORMAL LOW (ref 36.0–46.0)
Hemoglobin: 11 g/dL — ABNORMAL LOW (ref 12.0–15.0)
MCH: 27.6 pg (ref 26.0–34.0)
MCHC: 32.3 g/dL (ref 30.0–36.0)
MCV: 85.5 fL (ref 78.0–100.0)
PLATELETS: 340 10*3/uL (ref 150–400)
RBC: 3.99 MIL/uL (ref 3.87–5.11)
RDW: 12.9 % (ref 11.5–15.5)
WBC: 15.8 10*3/uL — AB (ref 4.0–10.5)

## 2017-09-18 LAB — IGG, IGA, IGM
IGG (IMMUNOGLOBIN G), SERUM: 1379 mg/dL (ref 700–1600)
IgA: 373 mg/dL — ABNORMAL HIGH (ref 87–352)
IgM (Immunoglobulin M), Srm: 178 mg/dL (ref 26–217)

## 2017-09-18 LAB — BETA 2 MICROGLOBULIN, SERUM: BETA 2 MICROGLOBULIN: 3.1 mg/L — AB (ref 0.6–2.4)

## 2017-09-18 LAB — GLUCOSE, CAPILLARY
Glucose-Capillary: 105 mg/dL — ABNORMAL HIGH (ref 65–99)
Glucose-Capillary: 124 mg/dL — ABNORMAL HIGH (ref 65–99)

## 2017-09-18 LAB — PROCALCITONIN: Procalcitonin: 0.52 ng/mL

## 2017-09-18 LAB — CALCIUM, IONIZED: CALCIUM, IONIZED, SERUM: 10.9 mg/dL — AB (ref 4.5–5.6)

## 2017-09-18 LAB — HIV ANTIBODY (ROUTINE TESTING W REFLEX): HIV Screen 4th Generation wRfx: NONREACTIVE

## 2017-09-18 MED ORDER — CALCITONIN (SALMON) 200 UNIT/ML IJ SOLN
4.0000 [IU]/kg | Freq: Two times a day (BID) | INTRAMUSCULAR | Status: AC
  Administered 2017-09-18 – 2017-09-19 (×4): 344 [IU] via SUBCUTANEOUS
  Filled 2017-09-18 (×6): qty 1.72

## 2017-09-18 MED ORDER — VANCOMYCIN HCL 10 G IV SOLR
1250.0000 mg | INTRAVENOUS | Status: DC
  Administered 2017-09-19: 1250 mg via INTRAVENOUS
  Filled 2017-09-18 (×2): qty 1250

## 2017-09-18 MED ORDER — FUROSEMIDE 10 MG/ML IJ SOLN
20.0000 mg | Freq: Once | INTRAMUSCULAR | Status: AC
  Administered 2017-09-18: 20 mg via INTRAVENOUS

## 2017-09-18 MED ORDER — PIPERACILLIN-TAZOBACTAM 3.375 G IVPB
3.3750 g | Freq: Three times a day (TID) | INTRAVENOUS | Status: DC
  Administered 2017-09-18 – 2017-09-22 (×12): 3.375 g via INTRAVENOUS
  Filled 2017-09-18 (×14): qty 50

## 2017-09-18 MED ORDER — CALCITONIN (SALMON) 200 UNIT/ACT NA SOLN
1.0000 | Freq: Every day | NASAL | Status: DC
  Administered 2017-09-18: 1 via NASAL
  Filled 2017-09-18: qty 3.7

## 2017-09-18 MED ORDER — FUROSEMIDE 10 MG/ML IJ SOLN
INTRAMUSCULAR | Status: AC
  Administered 2017-09-18: 20 mg via INTRAVENOUS
  Filled 2017-09-18: qty 2

## 2017-09-18 MED ORDER — VANCOMYCIN HCL 10 G IV SOLR
1500.0000 mg | Freq: Once | INTRAVENOUS | Status: AC
  Administered 2017-09-18: 1500 mg via INTRAVENOUS
  Filled 2017-09-18: qty 1500

## 2017-09-18 MED ORDER — HYDRALAZINE HCL 20 MG/ML IJ SOLN
10.0000 mg | Freq: Four times a day (QID) | INTRAMUSCULAR | Status: DC | PRN
  Administered 2017-09-18 – 2017-09-25 (×4): 10 mg via INTRAVENOUS
  Filled 2017-09-18 (×4): qty 1

## 2017-09-18 NOTE — Progress Notes (Signed)
Received patient from 5MW to 2C09.  Pt lethargic but arousable to verbal stimuli.  Able to answer questions appropriately, alert to self, place, and time but falls back asleep immediately.  IV vancomycin started per orders and  vital signs obtained.  Pt noted to be tachypneic w/ RR 30's-40's but pt denies SOB.  Lungs clear but diminished at bases.

## 2017-09-18 NOTE — Progress Notes (Signed)
CRITICAL VALUE ALERT  Critical Value:  Calcium >15  Date & Time Notied:  09/18/17 1525  Provider Notified: Dr. Tana Coast  Orders Received/Actions taken: No additional action taken.  Pt had not yet received SQ calcitonin prior to lab draw.  MD made aware pt had not received medication. Continue current plan.

## 2017-09-18 NOTE — Progress Notes (Signed)
IR aware of request for bone marrow biopsy.  PA to bedside to discuss procedure with patient and family.  Dr. Tana Coast present due to change in status.  Patient may be transferring to higher level of care.  Will continue to work towards procedure.   Brynda Greathouse, MS RD PA-C 12:54 PM

## 2017-09-18 NOTE — Progress Notes (Addendum)
[facility]                                                                              Patient Demographics  Olivia Patel, is a 57 y.o. female, DOB - [birth year], IPJ:[id number]  Admit date - 09/17/2017   Admitting Physician Lady Deutscher, MD  Outpatient Primary MD for the patient is Lance Sell, NP  Outpatient specialists:   LOS - 1  days   Medical records reviewed and are as summarized below:    Chief Complaint  Patient presents with  . Hypertension  . Weakness       Brief summary   Patient is a 57 year old female with hypertension, anemia, arthritis presented with progressive 1 week history of generalized weakness, fall on the morning of admission.  Patient's daughter also reported slight confusion following the fall.  Patient had decreased concentration, no stupor, polydipsia, polyuria. Labs showed potassium 2.8, chloride 84, bicarb 37, creatinine 1.85, calcium greater than 15, magnesium 1.4.  Assessment & Plan    Principal Problem: Symptomatic severe hypercalcemia: Likely due to malignancy, possibly multiple myeloma -Continue aggressive IV fluid hydration, has received Lasix, pamidronate x1 -Calcium continues to trend up more than 15  - will start calcitonin spray, repeat calcium later today -Follow myeloma panel, bone marrow biopsy scheduled for 5/28 Oncology consulted, appreciate recommendations   Active Problems: Hypokalemia -Received K-Dur, resolved  Acute kidney injury -Likely due to severe dehydration, hypercalcemia, possible multiple myeloma -Avoid NSAIDs, nephrotoxins, ACE inhibitors -Continue IV fluid hydration, creatinine trended up to 1.9 today  -Follow myeloma panel, if no significant improvement, will obtain renal ultrasound  Anemia, normocytic -Obtain anemia profile -H&H currently holding at 11.0, follow closely  Acute metabolic encephalopathy -Likely due to #1, follow closely, improving  Hypertension BP  not well controlled, continue amlodipine, labetalol, hydralazine as needed  Addendum  12:45pm Called for rapid response with decreased mental status, tachypnea, fevers 101.3 F -On assessment, patient appears somnolent but easily arousable  Lungs decreased at the bases otherwise no wheezing No focal neurological deficits noted -Stat ABG, chest x-ray, blood cultures, procalcitonin ordered -Chest x-ray showed bilateral pleural effusions with bibasilar hazy atelectasis versus airspace disease -Gave Lasix 20 mg IV x1, ordered vancomycin and Zosyn for sepsis of unclear etiology -MRI of the brain  -Transfer to stepdown unit, discussed with family at the bedside  Code Status: Full CODE STATUS DVT Prophylaxis: Heparin subcu Family Communication: Discussed in detail with the patient, all imaging results, lab results explained to the patient, daughter and sister at the bedside   Disposition Plan: Pending work-up  Time Spent in minutes 35 minutes  Procedures:  None  Consultants:   Oncology, Dr. Learta Codding  Antimicrobials:      Medications  Scheduled Meds: . amLODipine  5 mg Oral Daily  . calcitonin (salmon)  1 spray Alternating Nares Daily  . heparin  5,000 Units Subcutaneous Q8H  . labetalol  100 mg Oral BID   Continuous Infusions: . sodium chloride 150 mL/hr at 09/18/17 0803  . sodium chloride     PRN Meds:.acetaminophen **OR** acetaminophen, bisacodyl, HYDROcodone-acetaminophen, ondansetron **OR** ondansetron (ZOFRAN) IV, senna-docusate  Antibiotics   Anti-infectives (From admission, onward)   None        Subjective:   Olivia Patel was seen and examined today.  Still somewhat somnolent feeling better.  Family members noticed improvement in the speech.  No chest pain, shortness of breath, abdominal pain, N/V/D/C.    Objective:   Vitals:   09/17/17 1635 09/17/17 2046 09/18/17 0443 09/18/17 1056  BP: (!) 166/96 (!) 167/95 (!) 157/87 (!) 160/115  Pulse: (!)  109 97 93 97  Resp: '18 18 12 20  '$ Temp: 98.5 F (36.9 C) 98.5 F (36.9 C) 98.3 F (36.8 C) 98.4 F (36.9 C)  TempSrc: Oral Oral Oral Oral  SpO2: 93% 95% 94% 92%  Weight:  86.1 kg (189 lb 13.1 oz)    Height:        Intake/Output Summary (Last 24 hours) at 09/18/2017 1127 Last data filed at 09/18/2017 5188 Gross per 24 hour  Intake 3010 ml  Output 1700 ml  Net 1310 ml     Wt Readings from Last 3 Encounters:  09/17/17 86.1 kg (189 lb 13.1 oz)  08/31/17 87.5 kg (193 lb)  07/20/17 90 kg (198 lb 6.4 oz)     Exam  General: Much more alert and awake, oriented x2, NAD  Eyes:   HEENT:  Atraumatic, normocephalic  Cardiovascular: S1 S2 auscultated,  Regular rate and rhythm.  Respiratory: Clear to auscultation bilaterally, no wheezing, rales or rhonchi  Gastrointestinal: Soft, nontender, nondistended, + bowel sounds  Ext: no pedal edema bilaterally  Neuro: no new deficits  Musculoskeletal: No digital cyanosis, clubbing  Skin: No rashes  Psych: Normal affect and demeanor, alert and oriented x3    Data Reviewed:  I have personally reviewed following labs and imaging studies  Micro Results No results found for this or any previous visit (from the past 240 hour(s)).  Radiology Reports Ct Head Wo Contrast  Result Date: 09/17/2017 CLINICAL DATA:  Syncope EXAM: CT HEAD WITHOUT CONTRAST TECHNIQUE: Contiguous axial images were obtained from the base of the skull through the vertex without intravenous contrast. COMPARISON:  None. FINDINGS: Brain: There is low-density in the periventricular white matter, adjacent to the frontal horns, in the left frontal lobe subcortical white matter, as well as the bilateral parietal lobes. No mass effect, midline shift, or acute hemorrhage. Ventricular system is within normal limits. Vascular: No hyperdense vessel or unexpected calcification. Skull: Cranium is intact. Sinuses/Orbits: There is mucous material in the right maxillary sinus. Mastoid  air cells and remainder of the visualized paranasal sinuses are clear. Orbits are within normal limits. Other: Noncontributory IMPRESSION: There is patchy low density in the supratentorial white matter most likely related to chronic ischemic changes of small vessel disease. No acute intracranial pathology. Electronically Signed   By: Marybelle Killings M.D.   On: 09/17/2017 12:48   Dg Bone Survey Met  Result Date: 09/17/2017 CLINICAL DATA:  Hypercalcemia. EXAM: METASTATIC BONE SURVEY COMPARISON:  None. FINDINGS: Multiple rounded lucencies are noted in the skull concerning for lytic lesions. Mildly displaced third right rib fracture is noted. Lytic lesion is seen involving the lateral portion of the right seventh rib. Probable pathologic fracture is seen involving the lateral portion of the left seventh rib. No other abnormality is seen in the visualized skeleton. IMPRESSION: Multiple rounded lucencies are noted in the skull concerning for lytic lesions related to multiple myeloma. Also noted are bilateral lytic lesions in the ribs. Electronically Signed   By: Marijo Conception, M.D.  On: 09/17/2017 15:08    Lab Data:  CBC: Recent Labs  Lab 09/17/17 1118 09/18/17 0542  WBC 16.3* 15.8*  NEUTROABS 13.3*  --   HGB 12.0 11.0*  HCT 36.5 34.1*  MCV 84.5 85.5  PLT 344 154   Basic Metabolic Panel: Recent Labs  Lab 09/17/17 1118 09/17/17 1915 09/18/17 0542  NA 132* 131* 137  K 2.8* 3.4* 3.5  CL 84* 84* 93*  CO2 37* 34* 33*  GLUCOSE 119* 135* 107*  BUN 42* 42* 40*  CREATININE 1.85* 1.84* 1.93*  CALCIUM >15.0* >13.0* >15.0*  MG 1.4*  --   --    GFR: Estimated Creatinine Clearance: 34.7 mL/min (A) (by C-G formula based on SCr of 1.93 mg/dL (H)). Liver Function Tests: Recent Labs  Lab 09/17/17 1915 09/18/17 0542  AST  --  32  ALT  --  23  ALKPHOS  --  113  BILITOT  --  1.0  PROT 7.3 6.8  ALBUMIN 3.0* 2.7*   No results for input(s): LIPASE, AMYLASE in the last 168 hours. No results for  input(s): AMMONIA in the last 168 hours. Coagulation Profile: No results for input(s): INR, PROTIME in the last 168 hours. Cardiac Enzymes: Recent Labs  Lab 09/17/17 1118  CKTOTAL 73   BNP (last 3 results) No results for input(s): PROBNP in the last 8760 hours. HbA1C: No results for input(s): HGBA1C in the last 72 hours. CBG: Recent Labs  Lab 09/17/17 1642 09/17/17 1958 09/18/17 0747  GLUCAP 96 137* 105*   Lipid Profile: No results for input(s): CHOL, HDL, LDLCALC, TRIG, CHOLHDL, LDLDIRECT in the last 72 hours. Thyroid Function Tests: No results for input(s): TSH, T4TOTAL, FREET4, T3FREE, THYROIDAB in the last 72 hours. Anemia Panel: No results for input(s): VITAMINB12, FOLATE, FERRITIN, TIBC, IRON, RETICCTPCT in the last 72 hours. Urine analysis:    Component Value Date/Time   COLORURINE YELLOW 09/17/2017 1223   APPEARANCEUR HAZY (A) 09/17/2017 1223   LABSPEC 1.011 09/17/2017 1223   PHURINE 6.0 09/17/2017 1223   GLUCOSEU NEGATIVE 09/17/2017 1223   HGBUR SMALL (A) 09/17/2017 1223   BILIRUBINUR NEGATIVE 09/17/2017 [place] 09/17/2017 1223   PROTEINUR NEGATIVE 09/17/2017 1223   NITRITE NEGATIVE 09/17/2017 1223   LEUKOCYTESUR SMALL (A) 09/17/2017 1223     Jax Abdelrahman M.D. [facility] 09/18/2017, 11:27 AM  Pager: [phone] Between 7am to 7pm - call Pager - [phone]  After 7pm go to [email or web address] - password [email or web address]  Call night coverage person covering after 7pm

## 2017-09-18 NOTE — Progress Notes (Signed)
  Pharmacy Antibiotic Note  Olivia Patel is a 57 y.o. female admitted on 09/17/2017 with generalized weakness, and slight confusion, now suspected sepsis.  Pharmacy has been consulted for Vancomycin/Zosyn dosing. WBC elevated, Scr trending up, febrile.   Plan: Vancomycin LD 1500 mg IV x1, then Vancomycin 1250mg  IV q24h Zosyn 3.375g IV q8h EI Monitor clinic progress, WBC, TMax, renal function, electrolytes F/u BCx, C/S, future de-escalation, & length of therapy  Height: 5\' 3"  (160 cm) Weight: 189 lb 13.1 oz (86.1 kg) IBW/kg (Calculated) : 52.4  Temp (24hrs), Avg:99.3 F (37.4 C), Min:98.3 F (36.8 C), Max:101.3 F (38.5 C)  Recent Labs  Lab 09/17/17 1118 09/17/17 1915 09/18/17 0542  WBC 16.3*  --  15.8*  CREATININE 1.85* 1.84* 1.93*    Estimated Creatinine Clearance: 34.7 mL/min (A) (by C-G formula based on SCr of 1.93 mg/dL (H)).    No Known Allergies  Antimicrobials this admission: Vanc 5/26 >>  Zosyn 5/26 >>   Dose adjustments this admission:   Microbiology results: none  Nida Boatman, PharmD PGY1 Acute Care Pharmacy Resident 09/18/2017 1:04 PM

## 2017-09-18 NOTE — Progress Notes (Addendum)
IP PROGRESS NOTE  Subjective:   She she became lethargic after I saw her this morning.  She had a fever.  The calcium level was persistently elevated.  She was evaluated by Dr. Tana Coast and transferred to the stepdown unit.  Broad-spectrum intravenous antibiotics were started. Multiple family members are at the bedside.  She continues to be lethargic.  She received calcitonin at approximately 3:30 PM.  Further review of the medical record reveals a mammogram from June 2017 that revealed a possible calcification in the right breast and an asymmetry in the left breast.  There was no apparent follow-up mammogram. She is not aware of an abnormality in the right breast.  Objective: Vital signs in last 24 hours: Blood pressure (!) 138/108, pulse (!) 103, temperature 99.5 F (37.5 C), resp. rate (!) 9, height '5\' 3"'$  (1.6 m), weight 189 lb 13.1 oz (86.1 kg), last menstrual period 05/29/2015, SpO2 97 %.  Intake/Output from previous day: 05/25 0701 - 05/26 0700 In: 2890 [P.O.:480; I.V.:2010; IV Piggyback:400] Out: 0   Physical Exam:   Lungs: Clear anteriorly Cardiac: Regular rate and rhythm Abdomen: Soft and nontender Vascular: No leg edema Neurologic: Lethargic, arousable, follows commands, oriented to place, moves all extremities to command Lymph nodes: Small firm node in the deep medial right supraclavicular fossa Breast: There is diffuse skin thickening of the right breast.  Masslike fullness occupies the majority of the right breast, most prominent laterally   Lab Results: Recent Labs    09/17/17 1118 09/18/17 0542  WBC 16.3* 15.8*  HGB 12.0 11.0*  HCT 36.5 34.1*  PLT 344 340    BMET Recent Labs    09/18/17 1536 09/18/17 2027  NA 136 132*  K 3.4* 3.1*  CL 94* 92*  CO2 30 27  GLUCOSE 111* 133*  BUN 44* 44*  CREATININE 2.18* 2.32*  CALCIUM >15.0* 14.5*     Studies/Results: Ct Head Wo Contrast  Result Date: 09/17/2017 CLINICAL DATA:  Syncope EXAM: CT HEAD WITHOUT  CONTRAST TECHNIQUE: Contiguous axial images were obtained from the base of the skull through the vertex without intravenous contrast. COMPARISON:  None. FINDINGS: Brain: There is low-density in the periventricular white matter, adjacent to the frontal horns, in the left frontal lobe subcortical white matter, as well as the bilateral parietal lobes. No mass effect, midline shift, or acute hemorrhage. Ventricular system is within normal limits. Vascular: No hyperdense vessel or unexpected calcification. Skull: Cranium is intact. Sinuses/Orbits: There is mucous material in the right maxillary sinus. Mastoid air cells and remainder of the visualized paranasal sinuses are clear. Orbits are within normal limits. Other: Noncontributory IMPRESSION: There is patchy low density in the supratentorial white matter most likely related to chronic ischemic changes of small vessel disease. No acute intracranial pathology. Electronically Signed   By: Marybelle Killings M.D.   On: 09/17/2017 12:48   Dg Chest Port 1 View  Result Date: 09/18/2017 CLINICAL DATA:  Acute respiratory distress EXAM: PORTABLE CHEST 1 VIEW COMPARISON:  09/17/2017 FINDINGS: Normal heart size. Lungs are under aerated. Small pleural effusions and bibasilar hazy atelectasis versus airspace disease. No pneumothorax. IMPRESSION: Small pleural effusions. Bibasilar hazy atelectasis versus airspace disease. Electronically Signed   By: Marybelle Killings M.D.   On: 09/18/2017 12:56   Dg Bone Survey Met  Result Date: 09/17/2017 CLINICAL DATA:  Hypercalcemia. EXAM: METASTATIC BONE SURVEY COMPARISON:  None. FINDINGS: Multiple rounded lucencies are noted in the skull concerning for lytic lesions. Mildly displaced third right rib fracture is noted.  Lytic lesion is seen involving the lateral portion of the right seventh rib. Probable pathologic fracture is seen involving the lateral portion of the left seventh rib. No other abnormality is seen in the visualized skeleton.  IMPRESSION: Multiple rounded lucencies are noted in the skull concerning for lytic lesions related to multiple myeloma. Also noted are bilateral lytic lesions in the ribs. Electronically Signed   By: Marijo Conception, M.D.   On: 09/17/2017 15:08  I reviewed the bone survey and head CT with a radiologist.  He agrees that there are lytic lesions in the skull, upper cervical spine, and pelvis  Medications: I have reviewed the patient's current medications.  Assessment/Plan:  1.  Hypercalcemia, status post pamidronate 09/17/2017, calcitonin initiated 09/18/2017 2.  Dehydration 3.  Lytic bone lesions, rib fractures 4.  Hypertension 5.  Renal failure 6.  Diffuse skin thickening of the right breast with masslike fullness in the lateral aspect of the right breast 7.  Fever    She continues to have severe hypercalcemia.  She became lethargic earlier today.  The full myeloma panel is pending, but the IgG and IgM levels returned normal with only mild elevation of the IgA level. I am now suspicious of metastatic breast cancer accounting for the hypercalcemia and her clinical presentation.  I updated multiple family members at the bedside.  Recommendations: 1.  Continue intravenous hydration and calcitonin 2.  Repeat chemistry panel in the a.m. 09/19/2017 3.  CT neck and chest 09/19/2017 4.  Nephrology consult      LOS: 1 day   Betsy Coder, MD   09/18/2017, 9:40 PM

## 2017-09-18 NOTE — Progress Notes (Signed)
IP PROGRESS NOTE  Subjective:   She reports feeling better.  Her sister is at the bedside.  Her sister has noted improvement in the Ms. Shirk's speech.  The RN reports improvement in her speech and coordination.  Objective: Vital signs in last 24 hours: Blood pressure (!) 157/87, pulse 93, temperature 98.3 F (36.8 C), temperature source Oral, resp. rate 12, height '5\' 3"'$  (1.6 m), weight 189 lb 13.1 oz (86.1 kg), last menstrual period 05/29/2015, SpO2 94 %.  Intake/Output from previous day: 05/25 0701 - 05/26 0700 In: 2890 [P.O.:480; I.V.:2010; IV Piggyback:400] Out: 0   Physical Exam:  HEENT: No thrush Lungs: Clear anteriorly Cardiac: Regular rate and rhythm Extremities: Leg edema   Lab Results: Recent Labs    09/17/17 1118 09/18/17 0542  WBC 16.3* 15.8*  HGB 12.0 11.0*  HCT 36.5 34.1*  PLT 344 340    BMET Recent Labs    09/17/17 1915 09/18/17 0542  NA 131* 137  K 3.4* 3.5  CL 84* 93*  CO2 34* 33*  GLUCOSE 135* 107*  BUN 42* 40*  CREATININE 1.84* 1.93*  CALCIUM >13.0* >15.0*     Studies/Results: Ct Head Wo Contrast  Result Date: 09/17/2017 CLINICAL DATA:  Syncope EXAM: CT HEAD WITHOUT CONTRAST TECHNIQUE: Contiguous axial images were obtained from the base of the skull through the vertex without intravenous contrast. COMPARISON:  None. FINDINGS: Brain: There is low-density in the periventricular white matter, adjacent to the frontal horns, in the left frontal lobe subcortical white matter, as well as the bilateral parietal lobes. No mass effect, midline shift, or acute hemorrhage. Ventricular system is within normal limits. Vascular: No hyperdense vessel or unexpected calcification. Skull: Cranium is intact. Sinuses/Orbits: There is mucous material in the right maxillary sinus. Mastoid air cells and remainder of the visualized paranasal sinuses are clear. Orbits are within normal limits. Other: Noncontributory IMPRESSION: There is patchy low density in the  supratentorial white matter most likely related to chronic ischemic changes of small vessel disease. No acute intracranial pathology. Electronically Signed   By: Marybelle Killings M.D.   On: 09/17/2017 12:48   Dg Bone Survey Met  Result Date: 09/17/2017 CLINICAL DATA:  Hypercalcemia. EXAM: METASTATIC BONE SURVEY COMPARISON:  None. FINDINGS: Multiple rounded lucencies are noted in the skull concerning for lytic lesions. Mildly displaced third right rib fracture is noted. Lytic lesion is seen involving the lateral portion of the right seventh rib. Probable pathologic fracture is seen involving the lateral portion of the left seventh rib. No other abnormality is seen in the visualized skeleton. IMPRESSION: Multiple rounded lucencies are noted in the skull concerning for lytic lesions related to multiple myeloma. Also noted are bilateral lytic lesions in the ribs. Electronically Signed   By: Marijo Conception, M.D.   On: 09/17/2017 15:08    Medications: I have reviewed the patient's current medications.  Assessment/Plan:  1.  Hypercalcemia, status post pamidronate 09/17/2017 2.  Dehydration 3.  Lytic bone lesions, rib fractures 4.  Hypertension 5.  Elevated creatinine   Her mental status appears improved.  The severe hypercalcemia persists.  It is unclear whether the elevated BUN/creatinine are secondary to acute renal injury from dehydration or renal insufficiency related to myeloma.  I recommend holding Lasix and continuing intravenous hydration.  We will check a chemistry panel later today.  We can add calcitonin if she develops recurrent symptoms of hypercalcemia.  The myeloma panel is pending.  Recommendations: 1.  Continue intravenous hydration, hold Lasix 2.  Repeat chemistry panel this afternoon 3.  Follow-up myeloma panel, schedule bone marrow biopsy for 09/20/2017      LOS: 1 day   Betsy Coder, MD   09/18/2017, 8:48 AM

## 2017-09-18 NOTE — Significant Event (Signed)
Rapid Response Event Note  Overview: Time Called: 1200 Arrival Time: 1210 Event Type: Neurologic  Initial Focused Assessment: Patient admitted with hypercalcemia, fatigue Arneta Cliche.   Per family and staff patient more awake this am, but now she has a decreased LOC  BP 159/77  HR 104 RR 36-40 O2 sat 99%  Rectal Temp 101.3 Lung sounds clear, decreased bases.  Regular heart tones. Will awake and cough after stimulated, but falls asleep again quickly No focal deficits noted  Interventions: ABG done PCXR Dr Tana Coast at bedside Blood Cultures done 20 mg lasix given IV Tylenol PR  Transferred to  2c09   Plan of Care (if not transferred):  Event Summary: Name of Physician Notified: Dr Tana Coast at 1210    at    Outcome: Transferred (Comment)  Event End Time: [place]  Raliegh Ip

## 2017-09-19 ENCOUNTER — Inpatient Hospital Stay (HOSPITAL_COMMUNITY): Payer: Medicare Other

## 2017-09-19 ENCOUNTER — Encounter (HOSPITAL_COMMUNITY): Payer: Self-pay | Admitting: *Deleted

## 2017-09-19 DIAGNOSIS — R0603 Acute respiratory distress: Secondary | ICD-10-CM

## 2017-09-19 LAB — BASIC METABOLIC PANEL
Anion gap: 11 (ref 5–15)
BUN: 52 mg/dL — AB (ref 6–20)
CHLORIDE: 97 mmol/L — AB (ref 101–111)
CO2: 27 mmol/L (ref 22–32)
Calcium: 12.7 mg/dL — ABNORMAL HIGH (ref 8.9–10.3)
Creatinine, Ser: 2.76 mg/dL — ABNORMAL HIGH (ref 0.44–1.00)
GFR calc Af Amer: 21 mL/min — ABNORMAL LOW (ref >60)
GFR calc non Af Amer: 18 mL/min — ABNORMAL LOW (ref >60)
GLUCOSE: 134 mg/dL — AB (ref 65–99)
POTASSIUM: 3.2 mmol/L — AB (ref 3.5–5.1)
SODIUM: 135 mmol/L (ref 135–145)

## 2017-09-19 LAB — COMPREHENSIVE METABOLIC PANEL
ALK PHOS: 101 U/L (ref 38–126)
ALT: 25 U/L (ref 14–54)
AST: 32 U/L (ref 15–41)
Albumin: 2.5 g/dL — ABNORMAL LOW (ref 3.5–5.0)
Anion gap: 11 (ref 5–15)
BUN: 47 mg/dL — AB (ref 6–20)
CALCIUM: 13.9 mg/dL — AB (ref 8.9–10.3)
CHLORIDE: 95 mmol/L — AB (ref 101–111)
CO2: 30 mmol/L (ref 22–32)
CREATININE: 2.55 mg/dL — AB (ref 0.44–1.00)
GFR calc Af Amer: 23 mL/min — ABNORMAL LOW (ref >60)
GFR, EST NON AFRICAN AMERICAN: 20 mL/min — AB (ref >60)
Glucose, Bld: 164 mg/dL — ABNORMAL HIGH (ref 65–99)
Potassium: 3.2 mmol/L — ABNORMAL LOW (ref 3.5–5.1)
Sodium: 136 mmol/L (ref 135–145)
Total Bilirubin: 1.3 mg/dL — ABNORMAL HIGH (ref 0.3–1.2)
Total Protein: 6.5 g/dL (ref 6.5–8.1)

## 2017-09-19 LAB — CBC
HCT: 31.1 % — ABNORMAL LOW (ref 36.0–46.0)
Hemoglobin: 10.1 g/dL — ABNORMAL LOW (ref 12.0–15.0)
MCH: 27.7 pg (ref 26.0–34.0)
MCHC: 32.5 g/dL (ref 30.0–36.0)
MCV: 85.2 fL (ref 78.0–100.0)
PLATELETS: 318 10*3/uL (ref 150–400)
RBC: 3.65 MIL/uL — ABNORMAL LOW (ref 3.87–5.11)
RDW: 13.2 % (ref 11.5–15.5)
WBC: 14.5 10*3/uL — ABNORMAL HIGH (ref 4.0–10.5)

## 2017-09-19 MED ORDER — POTASSIUM CHLORIDE CRYS ER 20 MEQ PO TBCR
40.0000 meq | EXTENDED_RELEASE_TABLET | Freq: Once | ORAL | Status: AC
Start: 2017-09-19 — End: 2017-09-19
  Administered 2017-09-19: 40 meq via ORAL
  Filled 2017-09-19: qty 2

## 2017-09-19 MED ORDER — IOPAMIDOL (ISOVUE-300) INJECTION 61%
INTRAVENOUS | Status: AC
  Filled 2017-09-19: qty 30

## 2017-09-19 NOTE — Consult Note (Signed)
Chief Complaint: Patient was seen in consultation today for hypercalcemia  Referring Physician(s): Dr. Tana Coast, Dr. Benay Spice  Supervising Physician: Arne Cleveland  Patient Status: [facility] - In-pt  History of Present Illness: Olivia Patel is a 57 y.o. female with past medical history of anemia, arthritis, HTN who presented to [facility] ED due to weakness with fall at home. She was found to have several electrolyte abnormalities and was admitted for further work-up.   DG Bone Survey 09/17/17 showed: Multiple rounded lucencies are noted in the skull concerning for lytic lesions related to multiple myeloma. Also noted are bilateral lytic lesions in the ribs. Additional lesions are identified as follows: Pathologic fracture of the left anterior third rib. Focal lucent lesion in the left scapula at the glenoid neck. Small permeative lucent lesions in the left symphysis pubis and left inferior pubic ramus. Moderately severe degenerative changes in both hips. Lucent lesions in the femoral heads may represent degenerative cysts or destructive bone lesions. Calcification in the pelvis consistent with fibroid.  CT Abdomen Pelvis 09/19/17 showed: 1. Right breast mass with associated cutaneous thickening, thoracic adenopathy, axillary and subpectoral adenopathy, and widespread lytic osseous metastatic disease involving virtually all bony structures seen on today's exam. 2. Pathologic rib fractures bilaterally as detailed above. 3. 6 mm left upper lobe pulmonary nodule, nonspecific. 4. Small bilateral pleural effusions, nonspecific for transudative or exudative/malignant etiology. There are bands of atelectasis in both lower lobes. 5. Calcified uterine fibroids.  IR consulted for bone marrow biopsy at the request of Dr. Benay Spice and consideration of additional biopsy of breast mass at the request of Dr. Tana Coast.  Case reviewed and patient approved for bone marrow biopsy by Dr. Vernard Gambles.    Past  Medical History:  Diagnosis Date  . Anemia   . Arthritis   . Hypertension     Past Surgical History:  Procedure Laterality Date  . TUBAL LIGATION      Allergies: Patient has no known allergies.  Medications: Prior to Admission medications   Medication Sig Start Date End Date Taking? Authorizing Provider  amLODipine (NORVASC) 5 MG tablet Take 1 tablet (5 mg total) by mouth daily. 07/20/17  Yes Lance Sell, NP  diclofenac (VOLTAREN) 75 MG EC tablet Take 1 tablet (75 mg total) by mouth 2 (two) times daily. 09/14/17  Yes Wieters, Hallie C, PA-C  labetalol (NORMODYNE) 100 MG tablet Take 1 tablet (100 mg total) by mouth 2 (two) times daily. Must keep appt w/new provider for refills 06/06/17  Yes Shambley, Delphia Grates, NP  methocarbamol (ROBAXIN) 500 MG tablet Take 1 tablet (500 mg total) by mouth every 8 (eight) hours as needed for muscle spasms. 08/29/17  Yes Marrian Salvage, FNP  predniSONE (DELTASONE) 50 MG tablet Take 1 tablet (50 mg total) by mouth daily for 5 days. 09/14/17 09/19/17 Yes Wieters, Hallie C, PA-C  hydrochlorothiazide (HYDRODIURIL) 25 MG tablet Take 1 tablet (25 mg total) by mouth daily. Patient not taking: Reported on 09/17/2017 07/07/17   Lance Sell, NP     Family History  Problem Relation Age of Onset  . Hypertension Mother   . Diabetes Mother   . Diabetes Sister   . Hypertension Father   . Kidney failure Father     Social History   Socioeconomic History  . Marital status: Single    Spouse name: Not on file  . Number of children: 6  . Years of education: 51  . Highest education level: Not on file  Occupational History  .  Occupation: Disability  Social Needs  . Financial resource strain: Not on file  . Food insecurity:    Worry: Not on file    Inability: Not on file  . Transportation needs:    Medical: Not on file    Non-medical: Not on file  Tobacco Use  . Smoking status: Never Smoker  . Smokeless tobacco: Never Used  Substance  and Sexual Activity  . Alcohol use: No    Alcohol/week: 0.0 oz  . Drug use: No  . Sexual activity: Never    Partners: Male  Lifestyle  . Physical activity:    Days per week: Not on file    Minutes per session: Not on file  . Stress: Not on file  Relationships  . Social connections:    Talks on phone: Not on file    Gets together: Not on file    Attends religious service: Not on file    Active member of club or organization: Not on file    Attends meetings of clubs or organizations: Not on file    Relationship status: Not on file  Other Topics Concern  . Not on file  Social History Narrative  . Not on file     Review of Systems: A 12 point ROS discussed and pertinent positives are indicated in the HPI above.  All other systems are negative.  Review of Systems  Unable to perform ROS: Mental status change    Vital Signs: BP (!) 150/74 (BP Location: Right Arm)   Pulse 90   Temp 98.7 F (37.1 C) (Oral)   Resp (!) 36   Ht '5\' 3"'$  (1.6 m)   Wt 189 lb 13.1 oz (86.1 kg)   LMP 05/29/2015   SpO2 96%   BMI 33.62 kg/m   Physical Exam  Constitutional: She appears well-developed.  Cardiovascular: Normal rate, regular rhythm and normal heart sounds.  Pulmonary/Chest: Effort normal and breath sounds normal. No respiratory distress.  Abdominal: Soft.  Neurological: She is alert.  Intermittent confusion, follows simple commands  Skin: Skin is warm and dry.  Nursing note and vitals reviewed.    MD Evaluation Airway: Other (comments) Airway comments: difficulty opening mouth wide due to mentation Heart: WNL Abdomen: WNL Chest/ Lungs: WNL ASA  Classification: 3 Mallampati/Airway Score: One   Imaging: Ct Abdomen Pelvis Wo Contrast  Result Date: 09/19/2017 CLINICAL DATA:  Hypercalcemia, scattered lytic bone lesions. Concern for breast cancer. EXAM: CT CHEST, ABDOMEN AND PELVIS WITHOUT CONTRAST TECHNIQUE: Multidetector CT imaging of the chest, abdomen and pelvis was  performed following the standard protocol without IV contrast. COMPARISON:  09/17/2017 FINDINGS: CT CHEST FINDINGS Cardiovascular: Unremarkable Mediastinum/Nodes: Mediastinal and quite likely bilateral hilar adenopathy noted with a lower right paratracheal lymph node measuring 2.3 cm in short axis on image 21/4. Right hilar and subcarinal adenopathy observed. In individual right axillary node measures 1.9 cm in short axis on image 24/4. Lungs/Pleura: 0.6 by 0.5 cm left upper lobe pulmonary nodule, image 69/6. There bands of atelectasis in both lower lobes. Small bilateral pleural effusions. Musculoskeletal: Infiltrative masslike appearance in the right breast. One solid component superiorly measures 4.1 by 4.2 cm on image 24/4. There is skin thickening in the right breast. Widespread lytic osseous metastatic disease is present in all visualized bony structures. An index lytic lesion of the right lower sternum measures 1.9 by 1.4 by 2.7 cm. No current pathologic vertebral collapse. There pathologic fractures of the left third, fifth, seventh, eighth, and ninth ribs; and pathologic fractures  of the right third, seventh, eighth, and tenth ribs. CT ABDOMEN PELVIS FINDINGS Hepatobiliary: Unremarkable noncontrast CT appearance. Pancreas: Unremarkable Spleen: Unremarkable Adrenals/Urinary Tract: Unremarkable Stomach/Bowel: Unremarkable Vascular/Lymphatic: Unremarkable Reproductive: Calcified uterine fibroids. Other: No supplemental non-categorized findings. Musculoskeletal: Widespread lytic metastatic involvement of visualized abdomen and pelvic skeletal structures as well as the proximal femurs. IMPRESSION: 1. Right breast mass with associated cutaneous thickening, thoracic adenopathy, axillary and subpectoral adenopathy, and widespread lytic osseous metastatic disease involving virtually all bony structures seen on today's exam. 2. Pathologic rib fractures bilaterally as detailed above. 3. 6 mm left upper lobe pulmonary  nodule, nonspecific. 4. Small bilateral pleural effusions, nonspecific for transudative or exudative/malignant etiology. There are bands of atelectasis in both lower lobes. 5. Calcified uterine fibroids. Electronically Signed   By: Van Clines M.D.   On: 09/19/2017 12:04   Ct Head Wo Contrast  Addendum Date: 09/18/2017   ADDENDUM REPORT: 09/18/2017 21:45 ADDENDUM: Bone windows demonstrate multiple tiny lucencies throughout the skull without expansile change. Lucent lesions with mild expansion demonstrated in the skull base involving the occipital condyles bilaterally. This is greater on the left. Destructive bone lesion with a raised cortical flap involving the right pedicle and right anterior arch of the C1 vertebra. Small lucent lesion demonstrated in the odontoid process. Destructive expansile lesion in the left sphenoid bone. IMPRESSION: Multiple lucent lesions demonstrated in the calvarium, skull base, and in the visualized C1 and C2 vertebrae. Changes likely to represent multiple myeloma or lucent bone metastases. Electronically Signed   By: Lucienne Capers M.D.   On: 09/18/2017 21:45   Result Date: 09/18/2017 CLINICAL DATA:  Syncope EXAM: CT HEAD WITHOUT CONTRAST TECHNIQUE: Contiguous axial images were obtained from the base of the skull through the vertex without intravenous contrast. COMPARISON:  None. FINDINGS: Brain: There is low-density in the periventricular white matter, adjacent to the frontal horns, in the left frontal lobe subcortical white matter, as well as the bilateral parietal lobes. No mass effect, midline shift, or acute hemorrhage. Ventricular system is within normal limits. Vascular: No hyperdense vessel or unexpected calcification. Skull: Cranium is intact. Sinuses/Orbits: There is mucous material in the right maxillary sinus. Mastoid air cells and remainder of the visualized paranasal sinuses are clear. Orbits are within normal limits. Other: Noncontributory IMPRESSION:  There is patchy low density in the supratentorial white matter most likely related to chronic ischemic changes of small vessel disease. No acute intracranial pathology. Electronically Signed: By: Marybelle Killings M.D. On: 09/17/2017 12:48   Ct Chest Wo Contrast  Result Date: 09/19/2017 CLINICAL DATA:  Hypercalcemia, scattered lytic bone lesions. Concern for breast cancer. EXAM: CT CHEST, ABDOMEN AND PELVIS WITHOUT CONTRAST TECHNIQUE: Multidetector CT imaging of the chest, abdomen and pelvis was performed following the standard protocol without IV contrast. COMPARISON:  09/17/2017 FINDINGS: CT CHEST FINDINGS Cardiovascular: Unremarkable Mediastinum/Nodes: Mediastinal and quite likely bilateral hilar adenopathy noted with a lower right paratracheal lymph node measuring 2.3 cm in short axis on image 21/4. Right hilar and subcarinal adenopathy observed. In individual right axillary node measures 1.9 cm in short axis on image 24/4. Lungs/Pleura: 0.6 by 0.5 cm left upper lobe pulmonary nodule, image 69/6. There bands of atelectasis in both lower lobes. Small bilateral pleural effusions. Musculoskeletal: Infiltrative masslike appearance in the right breast. One solid component superiorly measures 4.1 by 4.2 cm on image 24/4. There is skin thickening in the right breast. Widespread lytic osseous metastatic disease is present in all visualized bony structures. An index lytic lesion of the right  lower sternum measures 1.9 by 1.4 by 2.7 cm. No current pathologic vertebral collapse. There pathologic fractures of the left third, fifth, seventh, eighth, and ninth ribs; and pathologic fractures of the right third, seventh, eighth, and tenth ribs. CT ABDOMEN PELVIS FINDINGS Hepatobiliary: Unremarkable noncontrast CT appearance. Pancreas: Unremarkable Spleen: Unremarkable Adrenals/Urinary Tract: Unremarkable Stomach/Bowel: Unremarkable Vascular/Lymphatic: Unremarkable Reproductive: Calcified uterine fibroids. Other: No supplemental  non-categorized findings. Musculoskeletal: Widespread lytic metastatic involvement of visualized abdomen and pelvic skeletal structures as well as the proximal femurs. IMPRESSION: 1. Right breast mass with associated cutaneous thickening, thoracic adenopathy, axillary and subpectoral adenopathy, and widespread lytic osseous metastatic disease involving virtually all bony structures seen on today's exam. 2. Pathologic rib fractures bilaterally as detailed above. 3. 6 mm left upper lobe pulmonary nodule, nonspecific. 4. Small bilateral pleural effusions, nonspecific for transudative or exudative/malignant etiology. There are bands of atelectasis in both lower lobes. 5. Calcified uterine fibroids. Electronically Signed   By: Van Clines M.D.   On: 09/19/2017 12:04   Dg Chest Port 1 View  Result Date: 09/18/2017 CLINICAL DATA:  Acute respiratory distress EXAM: PORTABLE CHEST 1 VIEW COMPARISON:  09/17/2017 FINDINGS: Normal heart size. Lungs are under aerated. Small pleural effusions and bibasilar hazy atelectasis versus airspace disease. No pneumothorax. IMPRESSION: Small pleural effusions. Bibasilar hazy atelectasis versus airspace disease. Electronically Signed   By: Marybelle Killings M.D.   On: 09/18/2017 12:56   Dg Bone Survey Met  Addendum Date: 09/18/2017   ADDENDUM REPORT: 09/18/2017 22:44 ADDENDUM: Addendum report completed. Electronically Signed   By: Lucienne Capers M.D.   On: 09/18/2017 22:44   Addendum Date: 09/18/2017   ADDENDUM REPORT: 09/18/2017 21:42 ADDENDUM: Additional lesions are identified as follows: Pathologic fracture of the left anterior third rib. Focal lucent lesion in the left scapula at the glenoid neck. Small permeative lucent lesions in the left symphysis pubis and left inferior pubic ramus. Moderately severe degenerative changes in both hips. Lucent lesions in the femoral heads may represent degenerative cysts or destructive bone lesions. Calcification in the pelvis consistent  with fibroid. Electronically Signed   By: Lucienne Capers M.D.   On: 09/18/2017 21:42   Result Date: 09/18/2017 CLINICAL DATA:  Hypercalcemia. EXAM: METASTATIC BONE SURVEY COMPARISON:  None. FINDINGS: Multiple rounded lucencies are noted in the skull concerning for lytic lesions. Mildly displaced third right rib fracture is noted. Lytic lesion is seen involving the lateral portion of the right seventh rib. Probable pathologic fracture is seen involving the lateral portion of the left seventh rib. No other abnormality is seen in the visualized skeleton. IMPRESSION: Multiple rounded lucencies are noted in the skull concerning for lytic lesions related to multiple myeloma. Also noted are bilateral lytic lesions in the ribs. Electronically Signed: By: Marijo Conception, M.D. On: 09/17/2017 15:08    Labs:  CBC: Recent Labs    06/06/17 1139 09/17/17 1118 09/18/17 0542 09/19/17 0228  WBC 6.9 16.3* 15.8* 14.5*  HGB 12.8 12.0 11.0* 10.1*  HCT 37.4 36.5 34.1* 31.1*  PLT 351 344 340 318    COAGS: No results for input(s): INR, APTT in the last 8760 hours.  BMP: Recent Labs    09/18/17 1331 09/18/17 1536 09/18/17 2027 09/19/17 0228  NA 135 136 132* 136  K 3.3* 3.4* 3.1* 3.2*  CL 92* 94* 92* 95*  CO2 32 '30 27 30  '$ GLUCOSE 112* 111* 133* 164*  BUN 41* 44* 44* 47*  CALCIUM >15.0* >15.0* 14.5* 13.9*  CREATININE 2.02* 2.18* 2.32* 2.55*  GFRNONAA 27* 24* 23* 20*  GFRAA 31* 28* 26* 23*    LIVER FUNCTION TESTS: Recent Labs    06/06/17 1139 09/17/17 1915 09/18/17 0542 09/18/17 2027 09/19/17 0228  BILITOT 0.4  --  1.0 1.3* 1.3*  AST 18  --  32 34 32  ALT 18  --  '23 25 25  '$ ALKPHOS 74  --  113 106 101  PROT 8.3 7.3 6.8 6.5 6.5  ALBUMIN 4.5 3.0* 2.7* 2.6* 2.5*    TUMOR MARKERS: No results for input(s): AFPTM, CEA, CA199, CHROMGRNA in the last 8760 hours.  Assessment and Plan: Hypercalcemia Patient admitted with electrolyte abnormalities, now also found with several lytic bone  lesions as well as breast mass.  Patient undergoing work-up for possible multiple myeloma as well as additional potential disease contributing to presentation and condition.  Discussed possible biopsies with Dr. Vernard Gambles who recommends proceeding with bone marrow biopsy at this time.  Discussed with sister at bedside.  Risks and benefits discussed with the patient including, but not limited to bleeding, infection, damage to adjacent structures or low yield requiring additional tests.  All of the patient's questions were answered, patient is agreeable to proceed. Consent signed and in chart.  Patient to be NPO after midnight and lovenox held.  Sister aware biopsy will be done this week schedule allowing.   Thank you for this interesting consult.  I greatly enjoyed meeting Olivia Patel and look forward to participating in their care.  A copy of this report was sent to the requesting provider on this date.  Electronically Signed: Docia Barrier, PA 09/19/2017, 12:15 PM   I spent a total of 40 Minutes    in face to face in clinical consultation, greater than 50% of which was counseling/coordinating care for hypercalcemia.

## 2017-09-19 NOTE — Progress Notes (Signed)
Pt more awake this evening, requesting water and ice cream. These were provided for the patient and she tolerated well.

## 2017-09-19 NOTE — Progress Notes (Signed)
IP PROGRESS NOTE  Subjective:   She is more alert this morning.  She denies pain and dyspnea.  Her sister is at the bedside.  Objective: Vital signs in last 24 hours: Blood pressure (!) 153/74, pulse 92, temperature 98.6 F (37 C), temperature source Oral, resp. rate (!) 39, height _0  (1.6 m), weight 189 lb 13.1 oz (86.1 kg), last menstrual period 05/29/2015, SpO2 97 %.  Intake/Output from previous day: 05/26 0701 - 05/27 0700 In: 2510 [P.O.:360; I.V.:2100; IV Piggyback:50] Out: 5900 [Urine:5900]  Physical Exam:  HEENT: No thrush Lungs: Clear anteriorly Cardiac: Regular rate and rhythm Abdomen: Soft and nontender Vascular: No leg edema Neurologic: Follows commands, moves all extremities Lymph nodes: Small firm node in the deep medial right supraclavicular fossa Breast: Diffuse skin thickening with masslike fullness throughout the majority of the right breast  Lab Results: Recent Labs    09/18/17 0542 09/19/17 0228  WBC 15.8* 14.5*  HGB 11.0* 10.1*  HCT 34.1* 31.1*  PLT 340 318    BMET Recent Labs    09/18/17 2027 09/19/17 0228  NA 132* 136  K 3.1* 3.2*  CL 92* 95*  CO2 27 30  GLUCOSE 133* 164*  BUN 44* 47*  CREATININE 2.32* 2.55*  CALCIUM 14.5* 13.9*     Studies/Results: Ct Head Wo Contrast  Addendum Date: 09/18/2017   ADDENDUM REPORT: 09/18/2017 21:45 ADDENDUM: Bone windows demonstrate multiple tiny lucencies throughout the skull without expansile change. Lucent lesions with mild expansion demonstrated in the skull base involving the occipital condyles bilaterally. This is greater on the left. Destructive bone lesion with a raised cortical flap involving the right pedicle and right anterior arch of the C1 vertebra. Small lucent lesion demonstrated in the odontoid process. Destructive expansile lesion in the left sphenoid bone. IMPRESSION: Multiple lucent lesions demonstrated in the calvarium, skull base, and in the visualized C1 and C2 vertebrae. Changes  likely to represent multiple myeloma or lucent bone metastases. Electronically Signed   By: Lucienne Capers M.D.   On: 09/18/2017 21:45   Result Date: 09/18/2017 CLINICAL DATA:  Syncope EXAM: CT HEAD WITHOUT CONTRAST TECHNIQUE: Contiguous axial images were obtained from the base of the skull through the vertex without intravenous contrast. COMPARISON:  None. FINDINGS: Brain: There is low-density in the periventricular white matter, adjacent to the frontal horns, in the left frontal lobe subcortical white matter, as well as the bilateral parietal lobes. No mass effect, midline shift, or acute hemorrhage. Ventricular system is within normal limits. Vascular: No hyperdense vessel or unexpected calcification. Skull: Cranium is intact. Sinuses/Orbits: There is mucous material in the right maxillary sinus. Mastoid air cells and remainder of the visualized paranasal sinuses are clear. Orbits are within normal limits. Other: Noncontributory IMPRESSION: There is patchy low density in the supratentorial white matter most likely related to chronic ischemic changes of small vessel disease. No acute intracranial pathology. Electronically Signed: By: Marybelle Killings M.D. On: 09/17/2017 12:48   Dg Chest Port 1 View  Result Date: 09/18/2017 CLINICAL DATA:  Acute respiratory distress EXAM: PORTABLE CHEST 1 VIEW COMPARISON:  09/17/2017 FINDINGS: Normal heart size. Lungs are under aerated. Small pleural effusions and bibasilar hazy atelectasis versus airspace disease. No pneumothorax. IMPRESSION: Small pleural effusions. Bibasilar hazy atelectasis versus airspace disease. Electronically Signed   By: Marybelle Killings M.D.   On: 09/18/2017 12:56   Dg Bone Survey Met  Addendum Date: 09/18/2017   ADDENDUM REPORT: 09/18/2017 22:44 ADDENDUM: Addendum report completed. Electronically Signed   By: Gwyndolyn Saxon  Gerilyn Nestle M.D.   On: 09/18/2017 22:44   Addendum Date: 09/18/2017   ADDENDUM REPORT: 09/18/2017 21:42 ADDENDUM: Additional lesions are  identified as follows: Pathologic fracture of the left anterior third rib. Focal lucent lesion in the left scapula at the glenoid neck. Small permeative lucent lesions in the left symphysis pubis and left inferior pubic ramus. Moderately severe degenerative changes in both hips. Lucent lesions in the femoral heads may represent degenerative cysts or destructive bone lesions. Calcification in the pelvis consistent with fibroid. Electronically Signed   By: Lucienne Capers M.D.   On: 09/18/2017 21:42   Result Date: 09/18/2017 CLINICAL DATA:  Hypercalcemia. EXAM: METASTATIC BONE SURVEY COMPARISON:  None. FINDINGS: Multiple rounded lucencies are noted in the skull concerning for lytic lesions. Mildly displaced third right rib fracture is noted. Lytic lesion is seen involving the lateral portion of the right seventh rib. Probable pathologic fracture is seen involving the lateral portion of the left seventh rib. No other abnormality is seen in the visualized skeleton. IMPRESSION: Multiple rounded lucencies are noted in the skull concerning for lytic lesions related to multiple myeloma. Also noted are bilateral lytic lesions in the ribs. Electronically Signed: By: Marijo Conception, M.D. On: 09/17/2017 15:08  I reviewed the bone survey and head CT with a radiologist.  He agrees that there are lytic lesions in the skull, upper cervical spine, and pelvis  Medications: I have reviewed the patient's current medications.  Assessment/Plan:  1.  Hypercalcemia, status post pamidronate 09/17/2017, calcitonin initiated 09/18/2017 2.  Dehydration 3.  Lytic bone lesions, rib fractures 4.  Hypertension 5.  Renal failure-?  Secondary to ATN 6.  Diffuse skin thickening of the right breast with masslike fullness in the lateral aspect of the right breast 7.  Fever 8.  Anemia  She remains lethargic, but improved.  The calcium level is lower today.  She has progressive renal failure.  This may be related to acute renal injury  from the presentation with dehydration.  We can look for evidence of renal obstruction on staging CTs.  I will order staging CTs for today.  I will try to arrange for a biopsy of the right breast or another lesion for 09/20/2017.  I updated her sister at the bedside.  .  Recommendations: 1.  Continue intravenous hydration and calcitonin 2.  Repeat chemistry panel this afternoon 3.  CT neck , chest, and abdomen/pelvis 09/19/2017 4.  Nephrology consult      LOS: 2 days   Betsy Coder, MD   09/19/2017, 6:46 AM

## 2017-09-19 NOTE — Consult Note (Signed)
HPI: Olivia Patel presented to the emergency room with several weeks of generalized malaise and weakness.  On the day of admission she was confused, got up to go to the bathroom, went to the wrong room and fell.   Her sister reports she has been confused.  She reportd pain a month ago in her back and took some NSAIDS. Diclofenac is on her preadmission med list. In the emergency room on 5/25 she was noted to have severe hypercalcemia to >15 and an elevated BUN to 42 and creatinine at 1.84. On 08/31/17 BUN was 11 and creat 0.'84mg'$ /dl, and calcium was 10.  Metastatic bone survey 5/25 revealed lytic lesions in the skull and rib fractures.  A brain CT showed no acute change, but bony lucencies.  A chest CT showed a right breast mass, pl effusion, thoracic adenopathy and pathologic rib fx. She was give IVF and diuresed with furosemide to a negative fluid balance and cr has risen to 2.76 today with a BUN of 52 and calcium is down to 12.9 after pamidronate and calcitonin.  Past Medical History:  Diagnosis Date  . Anemia   . Arthritis   . Hypertension    Past Surgical History:  Procedure Laterality Date  . TUBAL LIGATION     Social History:  reports that she has never smoked. She has never used smokeless tobacco. She reports that she does not drink alcohol or use drugs. Allergies: No Known Allergies Family History  Problem Relation Age of Onset  . Hypertension Mother   . Diabetes Mother   . Diabetes Sister   . Hypertension Father   . Kidney failure Father     Medications:  Scheduled: . amLODipine  5 mg Oral Daily  . calcitonin  4 Units/kg Subcutaneous Q12H  . heparin  5,000 Units Subcutaneous Q8H  . iopamidol      . labetalol  100 mg Oral BID   ROS: as per HPI Blood pressure (!) 142/73, pulse 88, temperature 98.2 F (36.8 C), temperature source Oral, resp. rate 18, height '5\' 3"'$  (1.6 m), weight 86.1 kg (189 lb 13.1 oz), last menstrual period 05/29/2015, SpO2 100 %.  General appearance: alert and  cooperative Head: Normocephalic, without obvious abnormality, atraumatic Eyes: negative Ears: normal TM's and external ear canals both ears Throat: lips, mucosa, and tongue normal; teeth and gums normal Resp: clear to auscultation bilaterally Chest wall: Breasts not examined Cardio: regular rate and rhythm, S1, S2 normal, no murmur, click, rub or gallop GI: soft, non-tender; bowel sounds normal; no masses,  no organomegaly Extremities: extremities normal, atraumatic, no cyanosis or edema Skin: Skin color, texture, turgor normal. No rashes or lesions Neurologic: mildly slow mentation Results for orders placed or performed during the hospital encounter of 09/17/17 (from the past 48 hour(s))  Basic metabolic panel     Status: Abnormal   Collection Time: 09/17/17  7:15 PM  Result Value Ref Range   Sodium 131 (L) 135 - 145 mmol/L   Potassium 3.4 (L) 3.5 - 5.1 mmol/L    Comment: DELTA CHECK NOTED   Chloride 84 (L) 101 - 111 mmol/L   CO2 34 (H) 22 - 32 mmol/L   Glucose, Bld 135 (H) 65 - 99 mg/dL   BUN 42 (H) 6 - 20 mg/dL   Creatinine, Ser 1.84 (H) 0.44 - 1.00 mg/dL   Calcium >15.0 (HH) 8.9 - 10.3 mg/dL    Comment: CORRECTION CALLED TO RN K BLOODWORTH AT 1447 [id number] MARTINB CORRECTED ON 05/26 AT 1450:  PREVIOUSLY REPORTED AS >13.0 CRITICAL RESULT CALLED TO, READ BACK BY AND VERIFIED WITH: RN K GENGLER AT 2014 [id number] MARTINB    GFR calc non Af Amer 30 (L) >60 mL/min   GFR calc Af Amer 35 (L) >60 mL/min    Comment: (NOTE) The eGFR has been calculated using the CKD EPI equation. This calculation has not been validated in all clinical situations. eGFR's persistently <60 mL/min signify possible Chronic Kidney Disease.    Anion gap 13 5 - 15    Comment: Performed at [facility] [address], [place], [place] [address]  Protein, total     Status: None   Collection Time: 09/17/17  7:15 PM  Result Value Ref Range   Total Protein 7.3 6.5 - 8.1 g/dL    Comment: Performed at [facility], [place] [address], [place], [place] [address]  Albumin     Status: Abnormal   Collection Time: 09/17/17  7:15 PM  Result Value Ref Range   Albumin 3.0 (L) 3.5 - 5.0 g/dL    Comment: Performed at [facility] [address] [place], [place] [address]  Glucose, capillary     Status: Abnormal   Collection Time: 09/17/17  7:58 PM  Result Value Ref Range   Glucose-Capillary 137 (H) 65 - 99 mg/dL  CBC     Status: Abnormal   Collection Time: 09/18/17  5:42 AM  Result Value Ref Range   WBC 15.8 (H) 4.0 - 10.5 K/uL   RBC 3.99 3.87 - 5.11 MIL/uL   Hemoglobin 11.0 (L) 12.0 - 15.0 g/dL   HCT 34.1 (L) 36.0 - 46.0 %   MCV 85.5 78.0 - 100.0 fL   MCH 27.6 26.0 - 34.0 pg   MCHC 32.3 30.0 - 36.0 g/dL   RDW 12.9 11.5 - 15.5 %   Platelets 340 150 - 400 K/uL    Comment: Performed at [facility], [place] [address], [place], [place] [address]  Comprehensive metabolic panel     Status: Abnormal   Collection Time: 09/18/17  5:42 AM  Result Value Ref Range   Sodium 137 135 - 145 mmol/L   Potassium 3.5 3.5 - 5.1 mmol/L   Chloride 93 (L) 101 - 111 mmol/L   CO2 33 (H) 22 - 32 mmol/L   Glucose, Bld 107 (H) 65 - 99 mg/dL   BUN 40 (H) 6 - 20 mg/dL   Creatinine, Ser 1.93 (H) 0.44 - 1.00 mg/dL   Calcium >15.0 (HH) 8.9 - 10.3 mg/dL    Comment: CRITICAL RESULT CALLED TO, READ BACK BY AND VERIFIED WITH: K.GENGLER,RN 0923 09/18/17 M.CAMPBELL    Total Protein 6.8 6.5 - 8.1 g/dL   Albumin 2.7 (L) 3.5 - 5.0 g/dL   AST 32 15 - 41 U/L   ALT 23 14 - 54 U/L   Alkaline Phosphatase 113 38 - 126 U/L   Total Bilirubin 1.0 0.3 - 1.2 mg/dL   GFR calc non Af Amer 28 (L) >60 mL/min   GFR calc Af Amer 33 (L) >60 mL/min    Comment: (NOTE) The eGFR has been calculated using the CKD EPI equation. This calculation has not been validated in all clinical situations. eGFR's persistently <60 mL/min signify possible Chronic Kidney Disease.    Anion gap 11 5 - 15    Comment: Performed at [facility] [address], [place], [place] [address]  Glucose, capillary     Status: Abnormal  Collection Time: 09/18/17  7:47 AM  Result Value Ref Range   Glucose-Capillary 105 (H) 65 - 99 mg/dL  Glucose, capillary     Status: Abnormal   Collection Time: 09/18/17 12:13 PM  Result Value Ref Range   Glucose-Capillary 124 (H) 65 - 99 mg/dL  Blood gas, arterial     Status: Abnormal   Collection Time: 09/18/17 12:40 PM  Result Value Ref Range   O2 Content 2.0 L/min   pH, Arterial 7.545 (H) 7.350 - 7.450   pCO2 arterial 39.1 32.0 - 48.0 mmHg   pO2, Arterial 76.2 (L) 83.0 - 108.0 mmHg   Bicarbonate 33.3 (H) 20.0 - 28.0 mmol/L   Acid-Base Excess 10.2 (H) 0.0 - 2.0 mmol/L   O2 Saturation 94.7 %   Patient temperature 101.3    Collection site RIGHT RADIAL    Drawn by [id number]    Sample type ARTERIAL DRAW    Allens test (pass/fail) PASS PASS  Basic metabolic panel     Status: Abnormal   Collection Time: 09/18/17  1:31 PM  Result Value Ref Range   Sodium 135 135 - 145 mmol/L   Potassium 3.3 (L) 3.5 - 5.1 mmol/L   Chloride 92 (L) 101 - 111 mmol/L   CO2 32 22 - 32 mmol/L   Glucose, Bld 112 (H) 65 - 99 mg/dL   BUN 41 (H) 6 - 20 mg/dL   Creatinine, Ser 2.02 (H) 0.44 - 1.00 mg/dL   Calcium >15.0 (HH) 8.9 - 10.3 mg/dL    Comment: CRITICAL RESULT CALLED TO, READ BACK BY AND VERIFIED WITH: RN K BLOODWORTH AT 1447 [id number] MARTINB    GFR calc non Af Amer 27 (L) >60 mL/min   GFR calc Af Amer 31 (L) >60 mL/min    Comment: (NOTE) The eGFR has been calculated using the CKD EPI equation. This calculation has not been validated in all clinical situations. eGFR's persistently <60 mL/min signify possible Chronic Kidney Disease.    Anion gap 11 5 - 15    Comment: Performed at [facility] [address] [place], [place] [address]  Procalcitonin - Baseline     Status: None   Collection Time: 09/18/17  1:31 PM  Result Value Ref Range   Procalcitonin 0.52 ng/mL    Comment:         Interpretation: PCT > 0.5 ng/mL and <= 2 ng/mL: Systemic infection (sepsis) is possible, but other conditions are known to elevate PCT as well. (NOTE)       Sepsis PCT Algorithm           Lower Respiratory Tract                                      Infection PCT Algorithm    ----------------------------     ----------------------------         PCT < 0.25 ng/mL                PCT < 0.10 ng/mL         Strongly encourage             Strongly discourage   discontinuation of antibiotics    initiation of antibiotics    ----------------------------     -----------------------------       PCT 0.25 - 0.50 ng/mL            PCT 0.10 - 0.25 ng/mL  OR       >80% decrease in PCT            Discourage initiation of                                            antibiotics      Encourage discontinuation           of antibiotics    ----------------------------     -----------------------------         PCT >= 0.50 ng/mL              PCT 0.26 - 0.50 ng/mL                AND       <80% decrease in PCT             Encourage initiation of                                             antibiotics       Encourage continuation           of antibiotics    ----------------------------     -----------------------------        PCT >= 0.50 ng/mL                  PCT > 0.50 ng/mL               AND         increase in PCT                  Strongly encourage                                      initiation of antibiotics    Strongly encourage escalation           of antibiotics                                     -----------------------------                                           PCT <= 0.25 ng/mL                                                 OR                                        > 80% decrease in PCT                                     Discontinue / Do not initiate  antibiotics Performed at [facility], [place] [address] [place],  [place] [address]   Culture, blood (Routine X 2) w Reflex to ID Panel     Status: None (Preliminary result)   Collection Time: 09/18/17  1:31 PM  Result Value Ref Range   Specimen Description BLOOD RIGHT HAND    Special Requests      BOTTLES DRAWN AEROBIC AND ANAEROBIC Blood Culture adequate volume   Culture      NO GROWTH < 24 HOURS Performed at [facility], [place] [address], [place], [place] [address]    Report Status PENDING   Culture, blood (Routine X 2) w Reflex to ID Panel     Status: None (Preliminary result)   Collection Time: 09/18/17  1:31 PM  Result Value Ref Range   Specimen Description BLOOD RIGHT WRIST    Special Requests      BOTTLES DRAWN AEROBIC AND ANAEROBIC Blood Culture adequate volume   Culture      NO GROWTH < 24 HOURS Performed at [facility], [place] [address] [place], [place] [address]    Report Status PENDING   Basic metabolic panel     Status: Abnormal   Collection Time: 09/18/17  3:36 PM  Result Value Ref Range   Sodium 136 135 - 145 mmol/L   Potassium 3.4 (L) 3.5 - 5.1 mmol/L   Chloride 94 (L) 101 - 111 mmol/L   CO2 30 22 - 32 mmol/L   Glucose, Bld 111 (H) 65 - 99 mg/dL   BUN 44 (H) 6 - 20 mg/dL   Creatinine, Ser 2.18 (H) 0.44 - 1.00 mg/dL   Calcium >15.0 (HH) 8.9 - 10.3 mg/dL    Comment: CRITICAL RESULT CALLED TO, READ BACK BY AND VERIFIED WITH: RN K BLOODWORTH AT 1640 [id number] MARTINB    GFR calc non Af Amer 24 (L) >60 mL/min   GFR calc Af Amer 28 (L) >60 mL/min    Comment: (NOTE) The eGFR has been calculated using the CKD EPI equation. This calculation has not been validated in all clinical situations. eGFR's persistently <60 mL/min signify possible Chronic Kidney Disease.    Anion gap 12 5 - 15    Comment: Performed at [facility] [address] [place], [place] [address]  Comprehensive metabolic panel     Status: Abnormal   Collection Time: 09/18/17  8:27 PM  Result Value Ref Range   Sodium 132 (L) 135 - 145 mmol/L    Potassium 3.1 (L) 3.5 - 5.1 mmol/L   Chloride 92 (L) 101 - 111 mmol/L   CO2 27 22 - 32 mmol/L   Glucose, Bld 133 (H) 65 - 99 mg/dL   BUN 44 (H) 6 - 20 mg/dL   Creatinine, Ser 2.32 (H) 0.44 - 1.00 mg/dL   Calcium 14.5 (HH) 8.9 - 10.3 mg/dL    Comment: CRITICAL RESULT CALLED TO, READ BACK BY AND VERIFIED WITH: HAGERMAN C,RN 09/18/17 2100 WAYK    Total Protein 6.5 6.5 - 8.1 g/dL   Albumin 2.6 (L) 3.5 - 5.0 g/dL   AST 34 15 - 41 U/L   ALT 25 14 - 54 U/L   Alkaline Phosphatase 106 38 - 126 U/L   Total Bilirubin 1.3 (H) 0.3 - 1.2 mg/dL   GFR calc non Af Amer 23 (L) >60 mL/min   GFR calc Af Amer 26 (L) >60 mL/min    Comment: (NOTE) The eGFR has been calculated using the CKD  EPI equation. This calculation has not been validated in all clinical situations. eGFR's persistently <60 mL/min signify possible Chronic Kidney Disease.    Anion gap 13 5 - 15    Comment: Performed at [facility] [address] [place], [place] [address]  Comprehensive metabolic panel     Status: Abnormal   Collection Time: 09/19/17  2:28 AM  Result Value Ref Range   Sodium 136 135 - 145 mmol/L   Potassium 3.2 (L) 3.5 - 5.1 mmol/L   Chloride 95 (L) 101 - 111 mmol/L   CO2 30 22 - 32 mmol/L   Glucose, Bld 164 (H) 65 - 99 mg/dL   BUN 47 (H) 6 - 20 mg/dL   Creatinine, Ser 2.55 (H) 0.44 - 1.00 mg/dL   Calcium 13.9 (HH) 8.9 - 10.3 mg/dL    Comment: CRITICAL RESULT CALLED TO, READ BACK BY AND VERIFIED WITH: RICHARD [facility]  09/19/17 0331 WAYK    Total Protein 6.5 6.5 - 8.1 g/dL   Albumin 2.5 (L) 3.5 - 5.0 g/dL   AST 32 15 - 41 U/L   ALT 25 14 - 54 U/L   Alkaline Phosphatase 101 38 - 126 U/L   Total Bilirubin 1.3 (H) 0.3 - 1.2 mg/dL   GFR calc non Af Amer 20 (L) >60 mL/min   GFR calc Af Amer 23 (L) >60 mL/min    Comment: (NOTE) The eGFR has been calculated using the CKD EPI equation. This calculation has not been validated in all clinical situations. eGFR's persistently <60 mL/min signify possible  Chronic Kidney Disease.    Anion gap 11 5 - 15    Comment: Performed at [facility] [address], [place], [place] [address]  CBC     Status: Abnormal   Collection Time: 09/19/17  2:28 AM  Result Value Ref Range   WBC 14.5 (H) 4.0 - 10.5 K/uL   RBC 3.65 (L) 3.87 - 5.11 MIL/uL   Hemoglobin 10.1 (L) 12.0 - 15.0 g/dL   HCT 31.1 (L) 36.0 - 46.0 %   MCV 85.2 78.0 - 100.0 fL   MCH 27.7 26.0 - 34.0 pg   MCHC 32.5 30.0 - 36.0 g/dL   RDW 13.2 11.5 - 15.5 %   Platelets 318 150 - 400 K/uL    Comment: Performed at [facility], [place] [address] [place], [place] [address]  Basic metabolic panel     Status: Abnormal   Collection Time: 09/19/17  3:08 PM  Result Value Ref Range   Sodium 135 135 - 145 mmol/L   Potassium 3.2 (L) 3.5 - 5.1 mmol/L   Chloride 97 (L) 101 - 111 mmol/L   CO2 27 22 - 32 mmol/L   Glucose, Bld 134 (H) 65 - 99 mg/dL   BUN 52 (H) 6 - 20 mg/dL   Creatinine, Ser 2.76 (H) 0.44 - 1.00 mg/dL   Calcium 12.7 (H) 8.9 - 10.3 mg/dL   GFR calc non Af Amer 18 (L) >60 mL/min   GFR calc Af Amer 21 (L) >60 mL/min    Comment: (NOTE) The eGFR has been calculated using the CKD EPI equation. This calculation has not been validated in all clinical situations. eGFR's persistently <60 mL/min signify possible Chronic Kidney Disease.    Anion gap 11 5 - 15    Comment: Performed at [facility] [address], [place],  [address]   Ct Abdomen Pelvis Wo Contrast  Result Date: 09/19/2017 CLINICAL  DATA:  Hypercalcemia, scattered lytic bone lesions. Concern for breast cancer. EXAM: CT CHEST, ABDOMEN AND PELVIS WITHOUT CONTRAST TECHNIQUE: Multidetector CT imaging of the chest, abdomen and pelvis was performed following the standard protocol without IV contrast. COMPARISON:  09/17/2017 FINDINGS: CT CHEST FINDINGS Cardiovascular: Unremarkable Mediastinum/Nodes: Mediastinal and quite likely bilateral hilar adenopathy noted with a lower right paratracheal lymph node  measuring 2.3 cm in short axis on image 21/4. Right hilar and subcarinal adenopathy observed. In individual right axillary node measures 1.9 cm in short axis on image 24/4. Lungs/Pleura: 0.6 by 0.5 cm left upper lobe pulmonary nodule, image 69/6. There bands of atelectasis in both lower lobes. Small bilateral pleural effusions. Musculoskeletal: Infiltrative masslike appearance in the right breast. One solid component superiorly measures 4.1 by 4.2 cm on image 24/4. There is skin thickening in the right breast. Widespread lytic osseous metastatic disease is present in all visualized bony structures. An index lytic lesion of the right lower sternum measures 1.9 by 1.4 by 2.7 cm. No current pathologic vertebral collapse. There pathologic fractures of the left third, fifth, seventh, eighth, and ninth ribs; and pathologic fractures of the right third, seventh, eighth, and tenth ribs. CT ABDOMEN PELVIS FINDINGS Hepatobiliary: Unremarkable noncontrast CT appearance. Pancreas: Unremarkable Spleen: Unremarkable Adrenals/Urinary Tract: Unremarkable Stomach/Bowel: Unremarkable Vascular/Lymphatic: Unremarkable Reproductive: Calcified uterine fibroids. Other: No supplemental non-categorized findings. Musculoskeletal: Widespread lytic metastatic involvement of visualized abdomen and pelvic skeletal structures as well as the proximal femurs. IMPRESSION: 1. Right breast mass with associated cutaneous thickening, thoracic adenopathy, axillary and subpectoral adenopathy, and widespread lytic osseous metastatic disease involving virtually all bony structures seen on today's exam. 2. Pathologic rib fractures bilaterally as detailed above. 3. 6 mm left upper lobe pulmonary nodule, nonspecific. 4. Small bilateral pleural effusions, nonspecific for transudative or exudative/malignant etiology. There are bands of atelectasis in both lower lobes. 5. Calcified uterine fibroids. Electronically Signed   By: Van Clines M.D.   On:  09/19/2017 12:04   Ct Soft Tissue Neck Wo Contrast  Result Date: 09/19/2017 CLINICAL DATA:  Breast mass, lytic bone lesions. EXAM: CT NECK WITHOUT CONTRAST TECHNIQUE: Multidetector CT imaging of the neck was performed following the standard protocol without intravenous contrast. COMPARISON:  Radiographs from 09/17/2017 FINDINGS: Pharynx and larynx: Mild circumferential prominence of Waldeyer's ring, likely incidental. Salivary glands: Unremarkable Thyroid: Unremarkable Lymph nodes: Right level II a lymph node upper normal size at 0.9 cm on image 33/4. Right level IV lymph node pathologically enlarged at 1.4 cm in diameter on image 63/4. Right upper subpectoral adenopathy is observed. Vascular: No significant atherosclerotic calcification. Limited intracranial: Unremarkable where included. Visualized orbits: Unremarkable where included. Mastoids and visualized paranasal sinuses: Chronic right maxillary sinusitis. Periapical lucency and cavity associated with tooth # 14. Skeleton: Lytic metastatic disease anteriorly in the left temporal bone near the orbit on image 3/4; in the left occipital condyle; in the right lateral mass of C2; and encompassing much of the C5 vertebral body. There are lytic lesions at all vertebral levels and a notable lytic lesion involving the sternal manubrium. Upper chest: Deferred to CT chest from same day. Other: No supplemental non-categorized findings. IMPRESSION: 1. Widespread lytic osseous metastatic disease with the largest lesions in the neck region along the left occipital condyle, the right lateral mass of C2, and in the C5 vertebral body. All visualized bony structures possibly with the exception of the mandible appear involved. 2. Right level IV adenopathy just above the supraclavicular region, lymph node 1.4 cm in short axis. There  is also right upper subpectoral adenopathy and known mediastinal adenopathy. 3. Periapical lucency and cavity associated with tooth # 14. 4.  Chronic right maxillary sinusitis. Electronically Signed   By: Van Clines M.D.   On: 09/19/2017 12:18   Ct Chest Wo Contrast  Result Date: 09/19/2017 CLINICAL DATA:  Hypercalcemia, scattered lytic bone lesions. Concern for breast cancer. EXAM: CT CHEST, ABDOMEN AND PELVIS WITHOUT CONTRAST TECHNIQUE: Multidetector CT imaging of the chest, abdomen and pelvis was performed following the standard protocol without IV contrast. COMPARISON:  09/17/2017 FINDINGS: CT CHEST FINDINGS Cardiovascular: Unremarkable Mediastinum/Nodes: Mediastinal and quite likely bilateral hilar adenopathy noted with a lower right paratracheal lymph node measuring 2.3 cm in short axis on image 21/4. Right hilar and subcarinal adenopathy observed. In individual right axillary node measures 1.9 cm in short axis on image 24/4. Lungs/Pleura: 0.6 by 0.5 cm left upper lobe pulmonary nodule, image 69/6. There bands of atelectasis in both lower lobes. Small bilateral pleural effusions. Musculoskeletal: Infiltrative masslike appearance in the right breast. One solid component superiorly measures 4.1 by 4.2 cm on image 24/4. There is skin thickening in the right breast. Widespread lytic osseous metastatic disease is present in all visualized bony structures. An index lytic lesion of the right lower sternum measures 1.9 by 1.4 by 2.7 cm. No current pathologic vertebral collapse. There pathologic fractures of the left third, fifth, seventh, eighth, and ninth ribs; and pathologic fractures of the right third, seventh, eighth, and tenth ribs. CT ABDOMEN PELVIS FINDINGS Hepatobiliary: Unremarkable noncontrast CT appearance. Pancreas: Unremarkable Spleen: Unremarkable Adrenals/Urinary Tract: Unremarkable Stomach/Bowel: Unremarkable Vascular/Lymphatic: Unremarkable Reproductive: Calcified uterine fibroids. Other: No supplemental non-categorized findings. Musculoskeletal: Widespread lytic metastatic involvement of visualized abdomen and pelvic skeletal  structures as well as the proximal femurs. IMPRESSION: 1. Right breast mass with associated cutaneous thickening, thoracic adenopathy, axillary and subpectoral adenopathy, and widespread lytic osseous metastatic disease involving virtually all bony structures seen on today's exam. 2. Pathologic rib fractures bilaterally as detailed above. 3. 6 mm left upper lobe pulmonary nodule, nonspecific. 4. Small bilateral pleural effusions, nonspecific for transudative or exudative/malignant etiology. There are bands of atelectasis in both lower lobes. 5. Calcified uterine fibroids. Electronically Signed   By: Van Clines M.D.   On: 09/19/2017 12:04   Mr Brain Wo Contrast  Result Date: 09/19/2017 CLINICAL DATA:  Unexplained alteration of consciousness. Physical exam as well as cross-sectional imaging findings concerning for metastatic breast cancer. EXAM: MRI HEAD WITHOUT CONTRAST TECHNIQUE: Multiplanar, multiecho pulse sequences of the brain and surrounding structures were obtained without intravenous contrast. COMPARISON:  CT neck without contrast earlier today. CT head 09/17/2017. FINDINGS: Brain: No acute stroke, acute hemorrhage, intra-axial mass lesion, hydrocephalus, or extra-axial fluid. Mild atrophy. Extensive T2 and FLAIR hyperintensities throughout the white matter, more focal and confluent, favored to represent chronic microvascular ischemic change. No definite intracranial metastatic disease. While this would be optimally evaluated with MultiHance, the patient cannot have gadolinium due to renal failure. Vascular: Flow voids are maintained. Skull and upper cervical spine: There are multiple metastatic lesions in the calvarium, better demonstrated on CT due to their small size. There is an aggressive osseous metastasis of C1 on the RIGHT. Sinuses/Orbits: RIGHT maxillary sinus layering fluid suggesting acuity. Otherwise clear. Negative orbits. Other: None. IMPRESSION: MRI brain demonstrating no definite  brain metastases, acute stroke, or other significant finding. Widespread white matter lesions, likely chronic microvascular ischemic change. Widespread osseous metastatic disease, most notably affecting C1 on the RIGHT but also the calvarium. No spinal cord  compression or intracranial mass lesion Electronically Signed   By: Staci Righter M.D.   On: 09/19/2017 16:47   Dg Chest Port 1 View  Result Date: 09/18/2017 CLINICAL DATA:  Acute respiratory distress EXAM: PORTABLE CHEST 1 VIEW COMPARISON:  09/17/2017 FINDINGS: Normal heart size. Lungs are under aerated. Small pleural effusions and bibasilar hazy atelectasis versus airspace disease. No pneumothorax. IMPRESSION: Small pleural effusions. Bibasilar hazy atelectasis versus airspace disease. Electronically Signed   By: Marybelle Killings M.D.   On: 09/18/2017 12:56    Assessment:  1 AKI, likely due to hypovolemia due to hypercalcemia, loop diuretic, decrease po intake +/- NSAID 2 Hypercalcemia of malignancy 3 Prob metastatic breast cancer(pending bx)  Plan: 1 IVFs with NS  Estanislado Emms 09/19/2017, 6:56 PM

## 2017-09-19 NOTE — Progress Notes (Signed)
[facility]                                                                              Patient Demographics  Olivia Patel, is a 57 y.o. female, DOB - [birth year], DJM:[id number]  Admit date - 09/17/2017   Admitting Physician Lady Deutscher, MD  Outpatient Primary MD for the patient is Lance Sell, NP  Outpatient specialists:   LOS - 2  days   Medical records reviewed and are as summarized below:    Chief Complaint  Patient presents with  . Hypertension  . Weakness       Brief summary   Patient is a 57 year old female with hypertension, anemia, arthritis presented with progressive 1 week history of generalized weakness, fall on the morning of admission.  Patient's daughter also reported slight confusion following the fall.  Patient had decreased concentration, no stupor, polydipsia, polyuria. Labs showed potassium 2.8, chloride 84, bicarb 37, creatinine 1.85, calcium greater than 15, magnesium 1.4.  Assessment & Plan    Principal Problem: Symptomatic severe hypercalcemia: Likely due to malignancy -Calcium starting to trend down, 13.9 today, corrected Ca still 15.1 -Patient received aggressive IV fluid hydration, Lasix, pamidronate x1  - Started on calcitonin -Myeloma work-up in progress, so far negative -Firm large mass like  fullness on the right breast, concerning for malignancy.  Discussed with surgery, recommended IR for biopsy.  Interventional radiology consulted -Staging CTs ordered, appreciate oncology recommendations, discussed with Dr. Learta Codding  Active Problems: Hypokalemia -replaced   Acute kidney injury -Likely due to severe dehydration, hypercalcemia  -Avoid NSAIDs, nephrotoxins, ACE inhibitors -Creatinine continues to trend up, discussed with nephrology, Dr. Florene Glen, will evaluate  Sepsis, acute hypoxic respiratory failure:  - On 5/26 had rapid response with tachypnea, altered mental status, spiking fevers -Procalcitonin  0.5, started on IV vancomycin and Zosyn.  Chest x-ray showed bilateral pleural effusions, bibasilar hazy atelectasis versus airspace disease -Follow blood culture  Anemia, normocytic -H&H currently stable  Acute metabolic encephalopathy -More alert and awake than yesterday, MRI of the brain pending, will follow.  Hypertension Continue amlodipine, labetalol, hydralazine as needed   Code Status: Full CODE STATUS DVT Prophylaxis: Heparin subcu Family Communication: Discussed in detail with the patient, all imaging results, lab results explained to the patient and sister at the bedside, daughter on the phone   Disposition Plan: Pending work-up  Time Spent in minutes 35 minutes  Procedures:  None  Consultants:   Oncology, Dr. Learta Codding  Antimicrobials:      Medications  Scheduled Meds: . amLODipine  5 mg Oral Daily  . calcitonin  4 Units/kg Subcutaneous Q12H  . heparin  5,000 Units Subcutaneous Q8H  . iopamidol      . labetalol  100 mg Oral BID   Continuous Infusions: . sodium chloride 150 mL/hr at 09/19/17 0651  . piperacillin-tazobactam (ZOSYN)  IV Stopped (09/19/17 1050)  . sodium chloride    . vancomycin     PRN Meds:.acetaminophen **OR** acetaminophen, bisacodyl, hydrALAZINE, HYDROcodone-acetaminophen, ondansetron **OR** ondansetron (ZOFRAN) IV, senna-docusate   Antibiotics   Anti-infectives (From admission, onward)   Start     Dose/Rate Route Frequency  Ordered Stop   09/19/17 1300  vancomycin (VANCOCIN) 1,250 mg in sodium chloride 0.9 % 250 mL IVPB     1,250 mg 166.7 mL/hr over 90 Minutes Intravenous Every 24 hours 09/18/17 1251     09/18/17 1300  piperacillin-tazobactam (ZOSYN) IVPB 3.375 g     3.375 g 12.5 mL/hr over 240 Minutes Intravenous Every 8 hours 09/18/17 1251     09/18/17 1300  vancomycin (VANCOCIN) 1,500 mg in sodium chloride 0.9 % 500 mL IVPB     1,500 mg 250 mL/hr over 120 Minutes Intravenous  Once 09/18/17 1251 09/18/17 1601         Subjective:   Olivia Patel was seen and examined today.  Still somnolent but arousable.  Sister at the bedside. No chest pain, shortness of breath, abdominal pain, N/V/D/C.  Low-grade temp of 99.2 F.  Objective:   Vitals:   09/18/17 2027 09/18/17 2347 09/19/17 0441 09/19/17 0710  BP:  (!) 145/96 (!) 153/74 (!) 150/74  Pulse:  97 92 90  Resp:  (!) 33 (!) 39 (!) 36  Temp: 99.5 F (37.5 C) 99.2 F (37.3 C) 98.6 F (37 C) 98.7 F (37.1 C)  TempSrc:  Oral Oral Oral  SpO2:  98% 97% 96%  Weight:      Height:        Intake/Output Summary (Last 24 hours) at 09/19/2017 1119 Last data filed at 09/19/2017 1050 Gross per 24 hour  Intake 2570 ml  Output 4650 ml  Net -2080 ml     Wt Readings from Last 3 Encounters:  09/17/17 86.1 kg (189 lb 13.1 oz)  08/31/17 87.5 kg (193 lb)  07/20/17 90 kg (198 lb 6.4 oz)     Exam    General: Somnolent but arousable  Eyes:   HEENT:    Cardiovascular: S1 S2 auscultated, Regular rate and rhythm. No pedal edema b/l  Respiratory: Clear to auscultation bilaterally, no wheezing, rales or rhonchi  Gastrointestinal: Soft, nontender, nondistended, + bowel sounds  Ext: no pedal edema bilaterally  Neuro: somnolent  Musculoskeletal: No digital cyanosis, clubbing  Skin: Forearm masslike fullness through the majority of the right breast, no abnormality felt in the left breast  Psych: somnolent but arousable   Data Reviewed:  I have personally reviewed following labs and imaging studies  Micro Results Recent Results (from the past 240 hour(s))  Culture, blood (Routine X 2) w Reflex to ID Panel     Status: None (Preliminary result)   Collection Time: 09/18/17  1:31 PM  Result Value Ref Range Status   Specimen Description   Final    BLOOD RIGHT WRIST Performed at [facility], [address], [place], [place] [address]    Special Requests   Final    BOTTLES DRAWN AEROBIC AND ANAEROBIC Blood Culture adequate volume    Culture PENDING  Incomplete   Report Status PENDING  Incomplete    Radiology Reports Ct Head Wo Contrast  Addendum Date: 09/18/2017   ADDENDUM REPORT: 09/18/2017 21:45 ADDENDUM: Bone windows demonstrate multiple tiny lucencies throughout the skull without expansile change. Lucent lesions with mild expansion demonstrated in the skull base involving the occipital condyles bilaterally. This is greater on the left. Destructive bone lesion with a raised cortical flap involving the right pedicle and right anterior arch of the C1 vertebra. Small lucent lesion demonstrated in the odontoid process. Destructive expansile lesion in the left sphenoid bone. IMPRESSION: Multiple lucent lesions demonstrated in the calvarium, skull base, and in the  visualized C1 and C2 vertebrae. Changes likely to represent multiple myeloma or lucent bone metastases. Electronically Signed   By: Lucienne Capers M.D.   On: 09/18/2017 21:45   Result Date: 09/18/2017 CLINICAL DATA:  Syncope EXAM: CT HEAD WITHOUT CONTRAST TECHNIQUE: Contiguous axial images were obtained from the base of the skull through the vertex without intravenous contrast. COMPARISON:  None. FINDINGS: Brain: There is low-density in the periventricular white matter, adjacent to the frontal horns, in the left frontal lobe subcortical white matter, as well as the bilateral parietal lobes. No mass effect, midline shift, or acute hemorrhage. Ventricular system is within normal limits. Vascular: No hyperdense vessel or unexpected calcification. Skull: Cranium is intact. Sinuses/Orbits: There is mucous material in the right maxillary sinus. Mastoid air cells and remainder of the visualized paranasal sinuses are clear. Orbits are within normal limits. Other: Noncontributory IMPRESSION: There is patchy low density in the supratentorial white matter most likely related to chronic ischemic changes of small vessel disease. No acute intracranial pathology. Electronically Signed: By:  Marybelle Killings M.D. On: 09/17/2017 12:48   Dg Chest Port 1 View  Result Date: 09/18/2017 CLINICAL DATA:  Acute respiratory distress EXAM: PORTABLE CHEST 1 VIEW COMPARISON:  09/17/2017 FINDINGS: Normal heart size. Lungs are under aerated. Small pleural effusions and bibasilar hazy atelectasis versus airspace disease. No pneumothorax. IMPRESSION: Small pleural effusions. Bibasilar hazy atelectasis versus airspace disease. Electronically Signed   By: Marybelle Killings M.D.   On: 09/18/2017 12:56   Dg Bone Survey Met  Addendum Date: 09/18/2017   ADDENDUM REPORT: 09/18/2017 22:44 ADDENDUM: Addendum report completed. Electronically Signed   By: Lucienne Capers M.D.   On: 09/18/2017 22:44   Addendum Date: 09/18/2017   ADDENDUM REPORT: 09/18/2017 21:42 ADDENDUM: Additional lesions are identified as follows: Pathologic fracture of the left anterior third rib. Focal lucent lesion in the left scapula at the glenoid neck. Small permeative lucent lesions in the left symphysis pubis and left inferior pubic ramus. Moderately severe degenerative changes in both hips. Lucent lesions in the femoral heads may represent degenerative cysts or destructive bone lesions. Calcification in the pelvis consistent with fibroid. Electronically Signed   By: Lucienne Capers M.D.   On: 09/18/2017 21:42   Result Date: 09/18/2017 CLINICAL DATA:  Hypercalcemia. EXAM: METASTATIC BONE SURVEY COMPARISON:  None. FINDINGS: Multiple rounded lucencies are noted in the skull concerning for lytic lesions. Mildly displaced third right rib fracture is noted. Lytic lesion is seen involving the lateral portion of the right seventh rib. Probable pathologic fracture is seen involving the lateral portion of the left seventh rib. No other abnormality is seen in the visualized skeleton. IMPRESSION: Multiple rounded lucencies are noted in the skull concerning for lytic lesions related to multiple myeloma. Also noted are bilateral lytic lesions in the ribs.  Electronically Signed: By: Marijo Conception, M.D. On: 09/17/2017 15:08    Lab Data:  CBC: Recent Labs  Lab 09/17/17 1118 09/18/17 0542 09/19/17 0228  WBC 16.3* 15.8* 14.5*  NEUTROABS 13.3*  --   --   HGB 12.0 11.0* 10.1*  HCT 36.5 34.1* 31.1*  MCV 84.5 85.5 85.2  PLT 344 340 161   Basic Metabolic Panel: Recent Labs  Lab 09/17/17 1118  09/18/17 0542 09/18/17 1331 09/18/17 1536 09/18/17 2027 09/19/17 0228  NA 132*   < > 137 135 136 132* 136  K 2.8*   < > 3.5 3.3* 3.4* 3.1* 3.2*  CL 84*   < > 93* 92* 94* 92*  95*  CO2 37*   < > 33* 32 '30 27 30  '$ GLUCOSE 119*   < > 107* 112* 111* 133* 164*  BUN 42*   < > 40* 41* 44* 44* 47*  CREATININE 1.85*   < > 1.93* 2.02* 2.18* 2.32* 2.55*  CALCIUM >15.0*   < > >15.0* >15.0* >15.0* 14.5* 13.9*  MG 1.4*  --   --   --   --   --   --    < > = values in this interval not displayed.   GFR: Estimated Creatinine Clearance: 26.2 mL/min (A) (by C-G formula based on SCr of 2.55 mg/dL (H)). Liver Function Tests: Recent Labs  Lab 09/17/17 1915 09/18/17 0542 09/18/17 2027 09/19/17 0228  AST  --  32 34 32  ALT  --  '23 25 25  '$ ALKPHOS  --  113 106 101  BILITOT  --  1.0 1.3* 1.3*  PROT 7.3 6.8 6.5 6.5  ALBUMIN 3.0* 2.7* 2.6* 2.5*   No results for input(s): LIPASE, AMYLASE in the last 168 hours. No results for input(s): AMMONIA in the last 168 hours. Coagulation Profile: No results for input(s): INR, PROTIME in the last 168 hours. Cardiac Enzymes: Recent Labs  Lab 09/17/17 1118  CKTOTAL 73   BNP (last 3 results) No results for input(s): PROBNP in the last 8760 hours. HbA1C: No results for input(s): HGBA1C in the last 72 hours. CBG: Recent Labs  Lab 09/17/17 1642 09/17/17 1958 09/18/17 0747 09/18/17 1213  GLUCAP 96 137* 105* 124*   Lipid Profile: No results for input(s): CHOL, HDL, LDLCALC, TRIG, CHOLHDL, LDLDIRECT in the last 72 hours. Thyroid Function Tests: No results for input(s): TSH, T4TOTAL, FREET4, T3FREE, THYROIDAB  in the last 72 hours. Anemia Panel: No results for input(s): VITAMINB12, FOLATE, FERRITIN, TIBC, IRON, RETICCTPCT in the last 72 hours. Urine analysis:    Component Value Date/Time   COLORURINE YELLOW 09/17/2017 1223   APPEARANCEUR HAZY (A) 09/17/2017 1223   LABSPEC 1.011 09/17/2017 1223   PHURINE 6.0 09/17/2017 1223   GLUCOSEU NEGATIVE 09/17/2017 1223   HGBUR SMALL (A) 09/17/2017 1223   BILIRUBINUR NEGATIVE 09/17/2017 [place] 09/17/2017 1223   PROTEINUR NEGATIVE 09/17/2017 1223   NITRITE NEGATIVE 09/17/2017 1223   LEUKOCYTESUR SMALL (A) 09/17/2017 1223     Angenette Daily M.D. [facility] 09/19/2017, 11:19 AM  Pager: [phone] Between 7am to 7pm - call Pager - [phone]  After 7pm go to [email or web address] - password [email or web address]  Call night coverage person covering after 7pm

## 2017-09-19 NOTE — Progress Notes (Signed)
Initial Nutrition Assessment  DOCUMENTATION CODES:   Obesity unspecified  INTERVENTION:   -30 ml Prostat BID, each supplement provides 100 kcals and 15 grams protein -Ensure Enlive po BID, each supplement provides 350 kcal and 20 grams of protein -If mental status changes and poor po intake persists, consider initiation of nutrition support  NUTRITION DIAGNOSIS:   Inadequate oral intake related to lethargy/confusion as evidenced by meal completion < 25%.  GOAL:   Patient will meet greater than or equal to 90% of their needs  MONITOR:   PO intake, Supplement acceptance, Labs, Weight trends, Skin, I & O's  REASON FOR ASSESSMENT:   Malnutrition Screening Tool    ASSESSMENT:   Very pleasant 57 year old female with a wonderful family who presents with complaints of fatigue and recent onset of back pain.  Evaluation in the emergency department reveals profound hypercalcemia and renal insufficiency  Pt admitted with hypercalcemia and renal insufficiency.   Multiple family members and RN in with pt at time of visit. Noted that pt was extremely somnolent and covered in blankets at time of visit.   Case discussed with RN, who reports that pt is extremely lethargic and has had very minimal PO intake. She will awaken enough to consume a few bites and sips, as well as take medications. Per RN, pt has consumed only a half cup of applesauce and a few sips of water today. Meal completion 05%.   Per MD notes, plan for rt breast biopsy tomorrow (09/20/17). Per RN, questionable metastatic breast cancer may be causing mental status change.   Noted pt has experienced a 8.6% wt loss over the past 3 months, which is significant for time frame. Suspect pt at high risk for malnutrition, however, unable to identify at this time.   Labs reviewed: K: 3.2.   Diet Order:   Diet Order           Diet NPO time specified Except for: Sips with Meds  Diet effective midnight        Diet renal with fluid  restriction Room service appropriate? Yes; Fluid consistency: Thin  Diet effective now          EDUCATION NEEDS:   Not appropriate for education at this time  Skin:  Skin Assessment: Reviewed RN Assessment  Last BM:  PTA  Height:   Ht Readings from Last 1 Encounters:  09/17/17 5\' 3"  (1.6 m)    Weight:   Wt Readings from Last 1 Encounters:  09/17/17 189 lb 13.1 oz (86.1 kg)    Ideal Body Weight:  52.3 kg  BMI:  Body mass index is 33.62 kg/m.  Estimated Nutritional Needs:   Kcal:  1650-1850  Protein:  85-100 grams  Fluid:  1.6-1.8 L    Krizia Flight A. Jimmye Norman, RD, LDN, CDE Pager: [phone] After hours Pager: [phone]

## 2017-09-20 DIAGNOSIS — D630 Anemia in neoplastic disease: Secondary | ICD-10-CM

## 2017-09-20 DIAGNOSIS — C7951 Secondary malignant neoplasm of bone: Secondary | ICD-10-CM

## 2017-09-20 LAB — COMPREHENSIVE METABOLIC PANEL
ALBUMIN: 2.3 g/dL — AB (ref 3.5–5.0)
ALT: 25 U/L (ref 14–54)
ANION GAP: 14 (ref 5–15)
AST: 30 U/L (ref 15–41)
Alkaline Phosphatase: 90 U/L (ref 38–126)
BUN: 49 mg/dL — AB (ref 6–20)
CO2: 25 mmol/L (ref 22–32)
Calcium: 11.5 mg/dL — ABNORMAL HIGH (ref 8.9–10.3)
Chloride: 99 mmol/L — ABNORMAL LOW (ref 101–111)
Creatinine, Ser: 2.61 mg/dL — ABNORMAL HIGH (ref 0.44–1.00)
GFR calc Af Amer: 23 mL/min — ABNORMAL LOW (ref >60)
GFR calc non Af Amer: 20 mL/min — ABNORMAL LOW (ref >60)
GLUCOSE: 125 mg/dL — AB (ref 65–99)
POTASSIUM: 3.2 mmol/L — AB (ref 3.5–5.1)
Sodium: 138 mmol/L (ref 135–145)
Total Bilirubin: 1.2 mg/dL (ref 0.3–1.2)
Total Protein: 6.2 g/dL — ABNORMAL LOW (ref 6.5–8.1)

## 2017-09-20 LAB — CBC
HEMATOCRIT: 28.8 % — AB (ref 36.0–46.0)
HEMOGLOBIN: 9.3 g/dL — AB (ref 12.0–15.0)
MCH: 27.7 pg (ref 26.0–34.0)
MCHC: 32.3 g/dL (ref 30.0–36.0)
MCV: 85.7 fL (ref 78.0–100.0)
Platelets: 298 10*3/uL (ref 150–400)
RBC: 3.36 MIL/uL — ABNORMAL LOW (ref 3.87–5.11)
RDW: 13.5 % (ref 11.5–15.5)
WBC: 15 10*3/uL — ABNORMAL HIGH (ref 4.0–10.5)

## 2017-09-20 LAB — MRSA PCR SCREENING: MRSA by PCR: NEGATIVE

## 2017-09-20 MED ORDER — HEPARIN SODIUM (PORCINE) 5000 UNIT/ML IJ SOLN: 5000.0000 [IU] | Freq: Three times a day (TID) | INTRAMUSCULAR | Status: DC

## 2017-09-20 MED ORDER — POTASSIUM CHLORIDE 10 MEQ/100ML IV SOLN
10.0000 meq | INTRAVENOUS | Status: AC
  Administered 2017-09-20 (×3): 10 meq via INTRAVENOUS
  Filled 2017-09-20 (×3): qty 100

## 2017-09-20 MED ORDER — POTASSIUM CHLORIDE 20 MEQ PO PACK
40.0000 meq | PACK | Freq: Once | ORAL | Status: AC
  Administered 2017-09-20: 40 meq via ORAL
  Filled 2017-09-20: qty 2

## 2017-09-20 NOTE — Progress Notes (Signed)
IP PROGRESS NOTE  Subjective:   She is alert.  Her sister is at the bedside.  Objective: Vital signs in last 24 hours: Blood pressure (!) 148/80, pulse 84, temperature 97.8 F (36.6 C), temperature source Oral, resp. rate 18, height _0  (1.6 m), weight 195 lb 12.3 oz (88.8 kg), last menstrual period 05/29/2015, SpO2 93 %.  Intake/Output from previous day: 05/27 0701 - 05/28 0700 In: 5637.5 [P.O.:300; I.V.:5187.5; IV Piggyback:150] Out: 1300 [Urine:1300]  Physical Exam:  HEENT: No thrush  Neurologic: Follows commands, moves all extremities   Lab Results: Recent Labs    09/19/17 0228 09/20/17 0400  WBC 14.5* 15.0*  HGB 10.1* 9.3*  HCT 31.1* 28.8*  PLT 318 298    BMET Recent Labs    09/19/17 1508 09/20/17 0400  NA 135 138  K 3.2* 3.2*  CL 97* 99*  CO2 27 25  GLUCOSE 134* 125*  BUN 52* 49*  CREATININE 2.76* 2.61*  CALCIUM 12.7* 11.5*     Studies/Results: Ct Abdomen Pelvis Wo Contrast  Result Date: 09/19/2017 CLINICAL DATA:  Hypercalcemia, scattered lytic bone lesions. Concern for breast cancer. EXAM: CT CHEST, ABDOMEN AND PELVIS WITHOUT CONTRAST TECHNIQUE: Multidetector CT imaging of the chest, abdomen and pelvis was performed following the standard protocol without IV contrast. COMPARISON:  09/17/2017 FINDINGS: CT CHEST FINDINGS Cardiovascular: Unremarkable Mediastinum/Nodes: Mediastinal and quite likely bilateral hilar adenopathy noted with a lower right paratracheal lymph node measuring 2.3 cm in short axis on image 21/4. Right hilar and subcarinal adenopathy observed. In individual right axillary node measures 1.9 cm in short axis on image 24/4. Lungs/Pleura: 0.6 by 0.5 cm left upper lobe pulmonary nodule, image 69/6. There bands of atelectasis in both lower lobes. Small bilateral pleural effusions. Musculoskeletal: Infiltrative masslike appearance in the right breast. One solid component superiorly measures 4.1 by 4.2 cm on image 24/4. There is skin thickening  in the right breast. Widespread lytic osseous metastatic disease is present in all visualized bony structures. An index lytic lesion of the right lower sternum measures 1.9 by 1.4 by 2.7 cm. No current pathologic vertebral collapse. There pathologic fractures of the left third, fifth, seventh, eighth, and ninth ribs; and pathologic fractures of the right third, seventh, eighth, and tenth ribs. CT ABDOMEN PELVIS FINDINGS Hepatobiliary: Unremarkable noncontrast CT appearance. Pancreas: Unremarkable Spleen: Unremarkable Adrenals/Urinary Tract: Unremarkable Stomach/Bowel: Unremarkable Vascular/Lymphatic: Unremarkable Reproductive: Calcified uterine fibroids. Other: No supplemental non-categorized findings. Musculoskeletal: Widespread lytic metastatic involvement of visualized abdomen and pelvic skeletal structures as well as the proximal femurs. IMPRESSION: 1. Right breast mass with associated cutaneous thickening, thoracic adenopathy, axillary and subpectoral adenopathy, and widespread lytic osseous metastatic disease involving virtually all bony structures seen on today's exam. 2. Pathologic rib fractures bilaterally as detailed above. 3. 6 mm left upper lobe pulmonary nodule, nonspecific. 4. Small bilateral pleural effusions, nonspecific for transudative or exudative/malignant etiology. There are bands of atelectasis in both lower lobes. 5. Calcified uterine fibroids. Electronically Signed   By: Van Clines M.D.   On: 09/19/2017 12:04   Ct Soft Tissue Neck Wo Contrast  Result Date: 09/19/2017 CLINICAL DATA:  Breast mass, lytic bone lesions. EXAM: CT NECK WITHOUT CONTRAST TECHNIQUE: Multidetector CT imaging of the neck was performed following the standard protocol without intravenous contrast. COMPARISON:  Radiographs from 09/17/2017 FINDINGS: Pharynx and larynx: Mild circumferential prominence of Waldeyer's ring, likely incidental. Salivary glands: Unremarkable Thyroid: Unremarkable Lymph nodes: Right  level II a lymph node upper normal size at 0.9 cm on image 33/4. Right  level IV lymph node pathologically enlarged at 1.4 cm in diameter on image 63/4. Right upper subpectoral adenopathy is observed. Vascular: No significant atherosclerotic calcification. Limited intracranial: Unremarkable where included. Visualized orbits: Unremarkable where included. Mastoids and visualized paranasal sinuses: Chronic right maxillary sinusitis. Periapical lucency and cavity associated with tooth # 14. Skeleton: Lytic metastatic disease anteriorly in the left temporal bone near the orbit on image 3/4; in the left occipital condyle; in the right lateral mass of C2; and encompassing much of the C5 vertebral body. There are lytic lesions at all vertebral levels and a notable lytic lesion involving the sternal manubrium. Upper chest: Deferred to CT chest from same day. Other: No supplemental non-categorized findings. IMPRESSION: 1. Widespread lytic osseous metastatic disease with the largest lesions in the neck region along the left occipital condyle, the right lateral mass of C2, and in the C5 vertebral body. All visualized bony structures possibly with the exception of the mandible appear involved. 2. Right level IV adenopathy just above the supraclavicular region, lymph node 1.4 cm in short axis. There is also right upper subpectoral adenopathy and known mediastinal adenopathy. 3. Periapical lucency and cavity associated with tooth # 14. 4. Chronic right maxillary sinusitis. Electronically Signed   By: Van Clines M.D.   On: 09/19/2017 12:18   Ct Chest Wo Contrast  Result Date: 09/19/2017 CLINICAL DATA:  Hypercalcemia, scattered lytic bone lesions. Concern for breast cancer. EXAM: CT CHEST, ABDOMEN AND PELVIS WITHOUT CONTRAST TECHNIQUE: Multidetector CT imaging of the chest, abdomen and pelvis was performed following the standard protocol without IV contrast. COMPARISON:  09/17/2017 FINDINGS: CT CHEST FINDINGS  Cardiovascular: Unremarkable Mediastinum/Nodes: Mediastinal and quite likely bilateral hilar adenopathy noted with a lower right paratracheal lymph node measuring 2.3 cm in short axis on image 21/4. Right hilar and subcarinal adenopathy observed. In individual right axillary node measures 1.9 cm in short axis on image 24/4. Lungs/Pleura: 0.6 by 0.5 cm left upper lobe pulmonary nodule, image 69/6. There bands of atelectasis in both lower lobes. Small bilateral pleural effusions. Musculoskeletal: Infiltrative masslike appearance in the right breast. One solid component superiorly measures 4.1 by 4.2 cm on image 24/4. There is skin thickening in the right breast. Widespread lytic osseous metastatic disease is present in all visualized bony structures. An index lytic lesion of the right lower sternum measures 1.9 by 1.4 by 2.7 cm. No current pathologic vertebral collapse. There pathologic fractures of the left third, fifth, seventh, eighth, and ninth ribs; and pathologic fractures of the right third, seventh, eighth, and tenth ribs. CT ABDOMEN PELVIS FINDINGS Hepatobiliary: Unremarkable noncontrast CT appearance. Pancreas: Unremarkable Spleen: Unremarkable Adrenals/Urinary Tract: Unremarkable Stomach/Bowel: Unremarkable Vascular/Lymphatic: Unremarkable Reproductive: Calcified uterine fibroids. Other: No supplemental non-categorized findings. Musculoskeletal: Widespread lytic metastatic involvement of visualized abdomen and pelvic skeletal structures as well as the proximal femurs. IMPRESSION: 1. Right breast mass with associated cutaneous thickening, thoracic adenopathy, axillary and subpectoral adenopathy, and widespread lytic osseous metastatic disease involving virtually all bony structures seen on today's exam. 2. Pathologic rib fractures bilaterally as detailed above. 3. 6 mm left upper lobe pulmonary nodule, nonspecific. 4. Small bilateral pleural effusions, nonspecific for transudative or exudative/malignant  etiology. There are bands of atelectasis in both lower lobes. 5. Calcified uterine fibroids. Electronically Signed   By: Van Clines M.D.   On: 09/19/2017 12:04   Mr Brain Wo Contrast  Result Date: 09/19/2017 CLINICAL DATA:  Unexplained alteration of consciousness. Physical exam as well as cross-sectional imaging findings concerning for metastatic breast cancer. EXAM:  MRI HEAD WITHOUT CONTRAST TECHNIQUE: Multiplanar, multiecho pulse sequences of the brain and surrounding structures were obtained without intravenous contrast. COMPARISON:  CT neck without contrast earlier today. CT head 09/17/2017. FINDINGS: Brain: No acute stroke, acute hemorrhage, intra-axial mass lesion, hydrocephalus, or extra-axial fluid. Mild atrophy. Extensive T2 and FLAIR hyperintensities throughout the white matter, more focal and confluent, favored to represent chronic microvascular ischemic change. No definite intracranial metastatic disease. While this would be optimally evaluated with MultiHance, the patient cannot have gadolinium due to renal failure. Vascular: Flow voids are maintained. Skull and upper cervical spine: There are multiple metastatic lesions in the calvarium, better demonstrated on CT due to their small size. There is an aggressive osseous metastasis of C1 on the RIGHT. Sinuses/Orbits: RIGHT maxillary sinus layering fluid suggesting acuity. Otherwise clear. Negative orbits. Other: None. IMPRESSION: MRI brain demonstrating no definite brain metastases, acute stroke, or other significant finding. Widespread white matter lesions, likely chronic microvascular ischemic change. Widespread osseous metastatic disease, most notably affecting C1 on the RIGHT but also the calvarium. No spinal cord compression or intracranial mass lesion Electronically Signed   By: Staci Righter M.D.   On: 09/19/2017 16:47   Dg Chest Port 1 View  Result Date: 09/18/2017 CLINICAL DATA:  Acute respiratory distress EXAM: PORTABLE CHEST 1  VIEW COMPARISON:  09/17/2017 FINDINGS: Normal heart size. Lungs are under aerated. Small pleural effusions and bibasilar hazy atelectasis versus airspace disease. No pneumothorax. IMPRESSION: Small pleural effusions. Bibasilar hazy atelectasis versus airspace disease. Electronically Signed   By: Marybelle Killings M.D.   On: 09/18/2017 12:56  I reviewed the bone survey and head CT with a radiologist.  He agrees that there are lytic lesions in the skull, upper cervical spine, and pelvis  Medications: I have reviewed the patient's current medications.  Assessment/Plan: 1.  Metastatic breast cancer  Large right breast mass with diffuse skin thickening  CTs 09/19/2017 with mediastinal/hilar adenopathy, subcarinal adenopathy, widespread bone lesions, right breast mass with skin thickening  MRI of the brain 09/19/2017-negative for brain metastases 2.  Hypercalcemia, status post pamidronate 09/17/2017, calcitonin initiated 09/18/2017 3.  Dehydration 4.  Lytic bone lesions, rib fractures secondary to #1 5.  Hypertension 6.  Renal failure-?  Secondary to ATN 7.  Fever-cultures negative to date 8.  Anemia  She is much more alert today.  She appears to have metastatic breast cancer with a large right breast mass/skin thickening and extensive bone metastases.  I discussed the case with interventional radiology.  They will biopsy the right breast or chest lymph nodes today.  We will request tissue for routine histology, ER/PR, HER-2, and molecular testing. I discussed the probable diagnosis and treatment plan with Ms. Harkless and her sister.  Recommendations: 1.  Continue intravenous hydration per nephrology, discontinue calcitonin 2.  Biopsy of right breast/chest lymph nodes today 3.  Increase ambulation as tolerated 4.  Plan to begin systemic therapy if a diagnosis of breast cancer is confirmed      LOS: 3 days   Betsy Coder, MD   09/20/2017, 7:01 AM

## 2017-09-20 NOTE — Progress Notes (Signed)
[facility]                                                                              Patient Demographics  Olivia Patel, is a 57 y.o. female, DOB - 09/02/1960, ENI:[id number]  Admit date - 09/17/2017   Admitting Physician Olivia Deutscher, MD  Outpatient Primary MD for the patient is Olivia Sell, NP  Outpatient specialists:   LOS - 3  days   Medical records reviewed and are as summarized below:    Chief Complaint  Patient presents with  . Hypertension  . Weakness       Brief summary   Patient is a 57 year old female with hypertension, anemia, arthritis presented with progressive 1 week history of generalized weakness, fall on the morning of admission.  Patient's daughter also reported slight confusion following the fall.  Patient had decreased concentration, no stupor, polydipsia, polyuria. Labs showed potassium 2.8, chloride 84, bicarb 37, creatinine 1.85, calcium greater than 15, magnesium 1.4.  Assessment & Plan    Principal Problem: Symptomatic severe hypercalcemia: Likely due to malignancy, new diagnosis of metastatic breast cancer, right breast -Presented with calcium of >15.  Now trending down, corrected calcium 12.9.   -Patient received aggressive IV fluid hydration, Lasix, pamidronate x1  -She was started on calcitonin, completed 4 doses -Firm large mass like  fullness on the right breast.  Staging CTs done showed mediastinal, hilar adenopathy, subcarinal adenopathy, widespread bony lesions with right breast mass with skin thickening. -Oncology following, d/w Dr Olivia Patel.  Plan for IR biopsy the right breast or chest lymph node today. NPO -MRI of the brain negative for brain metastasis.  Active Problems: Hypokalemia -Replaced IV  Acute kidney injury -Likely due to severe dehydration, hypercalcemia, ATN  -Avoid NSAIDs, nephrotoxins, ACE inhibitors -Creatinine trending down today, nephrology following.  2.61 today.  Currently  on IV fluid hydration.  Sepsis, acute hypoxic respiratory failure:  - On 5/26 had rapid response with tachypnea, altered mental status, spiking fevers - Procalcitonin 0.5, started on IV vancomycin and Zosyn.  Chest x-ray showed bilateral pleural effusions, bibasilar hazy atelectasis versus airspace disease -Blood cultures negative till date, no fevers since then, DC vancomycin  Anemia, normocytic -H&H currently stable  Acute metabolic encephalopathy -Alert and oriented, close to baseline, MRI of the brain negative for any metastasis  Hypertension Continue amlodipine, labetalol, hydralazine as needed   Code Status: Full CODE STATUS DVT Prophylaxis: Heparin subcu Family Communication: Discussed in detail with the patient, all imaging results, lab results explained to the patient and sister at the bedside   Disposition Plan: Pending work-up  Time Spent in minutes 35 minutes  Procedures:  CT chest, abdomen pelvis, MRI brain  Consultants:   Oncology, Dr. Learta Patel Interventional radiology  Antimicrobials:   IV vancomycin 5/26-5/28  IV Zosyn 5/26   Medications  Scheduled Meds: . amLODipine  5 mg Oral Daily  . heparin  5,000 Units Subcutaneous Q8H  . labetalol  100 mg Oral BID   Continuous Infusions: . sodium chloride 150 mL/hr at 09/20/17 0635  . piperacillin-tazobactam (ZOSYN)  IV Stopped (09/20/17 1009)  . sodium chloride    .  vancomycin Stopped (09/19/17 1630)   PRN Meds:.acetaminophen **OR** acetaminophen, bisacodyl, hydrALAZINE, HYDROcodone-acetaminophen, ondansetron **OR** ondansetron (ZOFRAN) IV, senna-docusate   Antibiotics   Anti-infectives (From admission, onward)   Start     Dose/Rate Route Frequency Ordered Stop   09/19/17 1300  vancomycin (VANCOCIN) 1,250 mg in sodium chloride 0.9 % 250 mL IVPB     1,250 mg 166.7 mL/hr over 90 Minutes Intravenous Every 24 hours 09/18/17 1251     09/18/17 1300  piperacillin-tazobactam (ZOSYN) IVPB 3.375 g     3.375  g 12.5 mL/hr over 240 Minutes Intravenous Every 8 hours 09/18/17 1251     09/18/17 1300  vancomycin (VANCOCIN) 1,500 mg in sodium chloride 0.9 % 500 mL IVPB     1,500 mg 250 mL/hr over 120 Minutes Intravenous  Once 09/18/17 1251 09/18/17 1601        Subjective:   Olivia Patel was seen and examined today.  Alert and oriented, feeling a lot better today.  Denies any specific complaints.  No pain currently.  Sister at the bedside.  No fevers.   No chest pain, shortness of breath, abdominal pain, N/V/D/C.    Objective:   Vitals:   09/20/17 0000 09/20/17 0316 09/20/17 0400 09/20/17 0700  BP: (!) 148/78 (!) 147/82 (!) 148/80 (!) 158/81  Pulse: 83 87 84 88  Resp:      Temp:  97.8 F (36.6 C)  98.3 F (36.8 C)  TempSrc:  Oral  Oral  SpO2: 93% 94% 93% 94%  Weight:  88.8 kg (195 lb 12.3 oz)    Height:        Intake/Output Summary (Last 24 hours) at 09/20/2017 1055 Last data filed at 09/20/2017 4536 Gross per 24 hour  Intake 5457.5 ml  Output 850 ml  Net 4607.5 ml     Wt Readings from Last 3 Encounters:  09/20/17 88.8 kg (195 lb 12.3 oz)  08/31/17 87.5 kg (193 lb)  07/20/17 90 kg (198 lb 6.4 oz)     Exam   General: Alert and oriented x 3, NAD  Eyes:   HEENT:  Atraumatic, normocephalic  Cardiovascular: S1 S2 auscultated,  Regular rate and rhythm. No pedal edema b/l  Respiratory: Decreased breath sound at the bases  Gastrointestinal: Soft, nontender, nondistended, + bowel sounds  Ext: no pedal edema bilaterally  Neuro: moving all 4 extremities, no new deficits, follows commands  Musculoskeletal: No digital cyanosis, clubbing  Skin: No rashes  Psych: Normal affect and demeanor, alert and oriented x3    Data Reviewed:  I have personally reviewed following labs and imaging studies  Micro Results Recent Results (from the past 240 hour(s))  Culture, blood (Routine X 2) w Reflex to ID Panel     Status: None (Preliminary result)   Collection Time: 09/18/17   1:31 PM  Result Value Ref Range Status   Specimen Description BLOOD RIGHT HAND  Final   Special Requests   Final    BOTTLES DRAWN AEROBIC AND ANAEROBIC Blood Culture adequate volume   Culture   Final    NO GROWTH < 24 HOURS Performed at [facility], [address] [place],  [address]    Report Status PENDING  Incomplete  Culture, blood (Routine X 2) w Reflex to ID Panel     Status: None (Preliminary result)   Collection Time: 09/18/17  1:31 PM  Result Value Ref Range Status   Specimen Description BLOOD RIGHT WRIST  Final   Special Requests   Final  BOTTLES DRAWN AEROBIC AND ANAEROBIC Blood Culture adequate volume   Culture   Final    NO GROWTH < 24 HOURS Performed at [facility], [place] [address], [place], [place] [address]    Report Status PENDING  Incomplete    Radiology Reports Ct Abdomen Pelvis Wo Contrast  Result Date: 09/19/2017 CLINICAL DATA:  Hypercalcemia, scattered lytic bone lesions. Concern for breast cancer. EXAM: CT CHEST, ABDOMEN AND PELVIS WITHOUT CONTRAST TECHNIQUE: Multidetector CT imaging of the chest, abdomen and pelvis was performed following the standard protocol without IV contrast. COMPARISON:  09/17/2017 FINDINGS: CT CHEST FINDINGS Cardiovascular: Unremarkable Mediastinum/Nodes: Mediastinal and quite likely bilateral hilar adenopathy noted with a lower right paratracheal lymph node measuring 2.3 cm in short axis on image 21/4. Right hilar and subcarinal adenopathy observed. In individual right axillary node measures 1.9 cm in short axis on image 24/4. Lungs/Pleura: 0.6 by 0.5 cm left upper lobe pulmonary nodule, image 69/6. There bands of atelectasis in both lower lobes. Small bilateral pleural effusions. Musculoskeletal: Infiltrative masslike appearance in the right breast. One solid component superiorly measures 4.1 by 4.2 cm on image 24/4. There is skin thickening in the right breast. Widespread lytic osseous metastatic disease is present in  all visualized bony structures. An index lytic lesion of the right lower sternum measures 1.9 by 1.4 by 2.7 cm. No current pathologic vertebral collapse. There pathologic fractures of the left third, fifth, seventh, eighth, and ninth ribs; and pathologic fractures of the right third, seventh, eighth, and tenth ribs. CT ABDOMEN PELVIS FINDINGS Hepatobiliary: Unremarkable noncontrast CT appearance. Pancreas: Unremarkable Spleen: Unremarkable Adrenals/Urinary Tract: Unremarkable Stomach/Bowel: Unremarkable Vascular/Lymphatic: Unremarkable Reproductive: Calcified uterine fibroids. Other: No supplemental non-categorized findings. Musculoskeletal: Widespread lytic metastatic involvement of visualized abdomen and pelvic skeletal structures as well as the proximal femurs. IMPRESSION: 1. Right breast mass with associated cutaneous thickening, thoracic adenopathy, axillary and subpectoral adenopathy, and widespread lytic osseous metastatic disease involving virtually all bony structures seen on today's exam. 2. Pathologic rib fractures bilaterally as detailed above. 3. 6 mm left upper lobe pulmonary nodule, nonspecific. 4. Small bilateral pleural effusions, nonspecific for transudative or exudative/malignant etiology. There are bands of atelectasis in both lower lobes. 5. Calcified uterine fibroids. Electronically Signed   By: Van Clines M.D.   On: 09/19/2017 12:04   Ct Head Wo Contrast  Addendum Date: 09/18/2017   ADDENDUM REPORT: 09/18/2017 21:45 ADDENDUM: Bone windows demonstrate multiple tiny lucencies throughout the skull without expansile change. Lucent lesions with mild expansion demonstrated in the skull base involving the occipital condyles bilaterally. This is greater on the left. Destructive bone lesion with a raised cortical flap involving the right pedicle and right anterior arch of the C1 vertebra. Small lucent lesion demonstrated in the odontoid process. Destructive expansile lesion in the left  sphenoid bone. IMPRESSION: Multiple lucent lesions demonstrated in the calvarium, skull base, and in the visualized C1 and C2 vertebrae. Changes likely to represent multiple myeloma or lucent bone metastases. Electronically Signed   By: Lucienne Capers M.D.   On: 09/18/2017 21:45   Result Date: 09/18/2017 CLINICAL DATA:  Syncope EXAM: CT HEAD WITHOUT CONTRAST TECHNIQUE: Contiguous axial images were obtained from the base of the skull through the vertex without intravenous contrast. COMPARISON:  None. FINDINGS: Brain: There is low-density in the periventricular white matter, adjacent to the frontal horns, in the left frontal lobe subcortical white matter, as well as the bilateral parietal lobes. No mass effect, midline shift, or acute hemorrhage. Ventricular system is within  normal limits. Vascular: No hyperdense vessel or unexpected calcification. Skull: Cranium is intact. Sinuses/Orbits: There is mucous material in the right maxillary sinus. Mastoid air cells and remainder of the visualized paranasal sinuses are clear. Orbits are within normal limits. Other: Noncontributory IMPRESSION: There is patchy low density in the supratentorial white matter most likely related to chronic ischemic changes of small vessel disease. No acute intracranial pathology. Electronically Signed: By: Marybelle Killings M.D. On: 09/17/2017 12:48   Ct Soft Tissue Neck Wo Contrast  Result Date: 09/19/2017 CLINICAL DATA:  Breast mass, lytic bone lesions. EXAM: CT NECK WITHOUT CONTRAST TECHNIQUE: Multidetector CT imaging of the neck was performed following the standard protocol without intravenous contrast. COMPARISON:  Radiographs from 09/17/2017 FINDINGS: Pharynx and larynx: Mild circumferential prominence of Waldeyer's ring, likely incidental. Salivary glands: Unremarkable Thyroid: Unremarkable Lymph nodes: Right level II a lymph node upper normal size at 0.9 cm on image 33/4. Right level IV lymph node pathologically enlarged at 1.4 cm in  diameter on image 63/4. Right upper subpectoral adenopathy is observed. Vascular: No significant atherosclerotic calcification. Limited intracranial: Unremarkable where included. Visualized orbits: Unremarkable where included. Mastoids and visualized paranasal sinuses: Chronic right maxillary sinusitis. Periapical lucency and cavity associated with tooth # 14. Skeleton: Lytic metastatic disease anteriorly in the left temporal bone near the orbit on image 3/4; in the left occipital condyle; in the right lateral mass of C2; and encompassing much of the C5 vertebral body. There are lytic lesions at all vertebral levels and a notable lytic lesion involving the sternal manubrium. Upper chest: Deferred to CT chest from same day. Other: No supplemental non-categorized findings. IMPRESSION: 1. Widespread lytic osseous metastatic disease with the largest lesions in the neck region along the left occipital condyle, the right lateral mass of C2, and in the C5 vertebral body. All visualized bony structures possibly with the exception of the mandible appear involved. 2. Right level IV adenopathy just above the supraclavicular region, lymph node 1.4 cm in short axis. There is also right upper subpectoral adenopathy and known mediastinal adenopathy. 3. Periapical lucency and cavity associated with tooth # 14. 4. Chronic right maxillary sinusitis. Electronically Signed   By: Van Clines M.D.   On: 09/19/2017 12:18   Ct Chest Wo Contrast  Result Date: 09/19/2017 CLINICAL DATA:  Hypercalcemia, scattered lytic bone lesions. Concern for breast cancer. EXAM: CT CHEST, ABDOMEN AND PELVIS WITHOUT CONTRAST TECHNIQUE: Multidetector CT imaging of the chest, abdomen and pelvis was performed following the standard protocol without IV contrast. COMPARISON:  09/17/2017 FINDINGS: CT CHEST FINDINGS Cardiovascular: Unremarkable Mediastinum/Nodes: Mediastinal and quite likely bilateral hilar adenopathy noted with a lower right  paratracheal lymph node measuring 2.3 cm in short axis on image 21/4. Right hilar and subcarinal adenopathy observed. In individual right axillary node measures 1.9 cm in short axis on image 24/4. Lungs/Pleura: 0.6 by 0.5 cm left upper lobe pulmonary nodule, image 69/6. There bands of atelectasis in both lower lobes. Small bilateral pleural effusions. Musculoskeletal: Infiltrative masslike appearance in the right breast. One solid component superiorly measures 4.1 by 4.2 cm on image 24/4. There is skin thickening in the right breast. Widespread lytic osseous metastatic disease is present in all visualized bony structures. An index lytic lesion of the right lower sternum measures 1.9 by 1.4 by 2.7 cm. No current pathologic vertebral collapse. There pathologic fractures of the left third, fifth, seventh, eighth, and ninth ribs; and pathologic fractures of the right third, seventh, eighth, and tenth ribs. CT ABDOMEN PELVIS FINDINGS  Hepatobiliary: Unremarkable noncontrast CT appearance. Pancreas: Unremarkable Spleen: Unremarkable Adrenals/Urinary Tract: Unremarkable Stomach/Bowel: Unremarkable Vascular/Lymphatic: Unremarkable Reproductive: Calcified uterine fibroids. Other: No supplemental non-categorized findings. Musculoskeletal: Widespread lytic metastatic involvement of visualized abdomen and pelvic skeletal structures as well as the proximal femurs. IMPRESSION: 1. Right breast mass with associated cutaneous thickening, thoracic adenopathy, axillary and subpectoral adenopathy, and widespread lytic osseous metastatic disease involving virtually all bony structures seen on today's exam. 2. Pathologic rib fractures bilaterally as detailed above. 3. 6 mm left upper lobe pulmonary nodule, nonspecific. 4. Small bilateral pleural effusions, nonspecific for transudative or exudative/malignant etiology. There are bands of atelectasis in both lower lobes. 5. Calcified uterine fibroids. Electronically Signed   By: Van Clines M.D.   On: 09/19/2017 12:04   Mr Brain Wo Contrast  Result Date: 09/19/2017 CLINICAL DATA:  Unexplained alteration of consciousness. Physical exam as well as cross-sectional imaging findings concerning for metastatic breast cancer. EXAM: MRI HEAD WITHOUT CONTRAST TECHNIQUE: Multiplanar, multiecho pulse sequences of the brain and surrounding structures were obtained without intravenous contrast. COMPARISON:  CT neck without contrast earlier today. CT head 09/17/2017. FINDINGS: Brain: No acute stroke, acute hemorrhage, intra-axial mass lesion, hydrocephalus, or extra-axial fluid. Mild atrophy. Extensive T2 and FLAIR hyperintensities throughout the white matter, more focal and confluent, favored to represent chronic microvascular ischemic change. No definite intracranial metastatic disease. While this would be optimally evaluated with MultiHance, the patient cannot have gadolinium due to renal failure. Vascular: Flow voids are maintained. Skull and upper cervical spine: There are multiple metastatic lesions in the calvarium, better demonstrated on CT due to their small size. There is an aggressive osseous metastasis of C1 on the RIGHT. Sinuses/Orbits: RIGHT maxillary sinus layering fluid suggesting acuity. Otherwise clear. Negative orbits. Other: None. IMPRESSION: MRI brain demonstrating no definite brain metastases, acute stroke, or other significant finding. Widespread white matter lesions, likely chronic microvascular ischemic change. Widespread osseous metastatic disease, most notably affecting C1 on the RIGHT but also the calvarium. No spinal cord compression or intracranial mass lesion Electronically Signed   By: Staci Righter M.D.   On: 09/19/2017 16:47   Dg Chest Port 1 View  Result Date: 09/18/2017 CLINICAL DATA:  Acute respiratory distress EXAM: PORTABLE CHEST 1 VIEW COMPARISON:  09/17/2017 FINDINGS: Normal heart size. Lungs are under aerated. Small pleural effusions and bibasilar hazy  atelectasis versus airspace disease. No pneumothorax. IMPRESSION: Small pleural effusions. Bibasilar hazy atelectasis versus airspace disease. Electronically Signed   By: Marybelle Killings M.D.   On: 09/18/2017 12:56   Dg Bone Survey Met  Addendum Date: 09/18/2017   ADDENDUM REPORT: 09/18/2017 22:44 ADDENDUM: Addendum report completed. Electronically Signed   By: Lucienne Capers M.D.   On: 09/18/2017 22:44   Addendum Date: 09/18/2017   ADDENDUM REPORT: 09/18/2017 21:42 ADDENDUM: Additional lesions are identified as follows: Pathologic fracture of the left anterior third rib. Focal lucent lesion in the left scapula at the glenoid neck. Small permeative lucent lesions in the left symphysis pubis and left inferior pubic ramus. Moderately severe degenerative changes in both hips. Lucent lesions in the femoral heads may represent degenerative cysts or destructive bone lesions. Calcification in the pelvis consistent with fibroid. Electronically Signed   By: Lucienne Capers M.D.   On: 09/18/2017 21:42   Result Date: 09/18/2017 CLINICAL DATA:  Hypercalcemia. EXAM: METASTATIC BONE SURVEY COMPARISON:  None. FINDINGS: Multiple rounded lucencies are noted in the skull concerning for lytic lesions. Mildly displaced third right rib fracture is noted. Lytic lesion is seen involving  the lateral portion of the right seventh rib. Probable pathologic fracture is seen involving the lateral portion of the left seventh rib. No other abnormality is seen in the visualized skeleton. IMPRESSION: Multiple rounded lucencies are noted in the skull concerning for lytic lesions related to multiple myeloma. Also noted are bilateral lytic lesions in the ribs. Electronically Signed: By: Marijo Conception, M.D. On: 09/17/2017 15:08    Lab Data:  CBC: Recent Labs  Lab 09/17/17 1118 09/18/17 0542 09/19/17 0228 09/20/17 0400  WBC 16.3* 15.8* 14.5* 15.0*  NEUTROABS 13.3*  --   --   --   HGB 12.0 11.0* 10.1* 9.3*  HCT 36.5 34.1* 31.1*  28.8*  MCV 84.5 85.5 85.2 85.7  PLT 344 340 318 323   Basic Metabolic Panel: Recent Labs  Lab 09/17/17 1118  09/18/17 1536 09/18/17 2027 09/19/17 0228 09/19/17 1508 09/20/17 0400  NA 132*   < > 136 132* 136 135 138  K 2.8*   < > 3.4* 3.1* 3.2* 3.2* 3.2*  CL 84*   < > 94* 92* 95* 97* 99*  CO2 37*   < > _0 GLUCOSE 119*   < > 111* 133* 164* 134* 125*  BUN 42*   < > 44* 44* 47* 52* 49*  CREATININE 1.85*   < > 2.18* 2.32* 2.55* 2.76* 2.61*  CALCIUM >15.0*   < > >15.0* 14.5* 13.9* 12.7* 11.5*  MG 1.4*  --   --   --   --   --   --    < > = values in this interval not displayed.   GFR: Estimated Creatinine Clearance: 26.1 mL/min (A) (by C-G formula based on SCr of 2.61 mg/dL (H)). Liver Function Tests: Recent Labs  Lab 09/17/17 1915 09/18/17 0542 09/18/17 2027 09/19/17 0228 09/20/17 0400  AST  --  32 34 32 30  ALT  --  _1 ALKPHOS  --  113 106 101 90  BILITOT  --  1.0 1.3* 1.3* 1.2  PROT 7.3 6.8 6.5 6.5 6.2*  ALBUMIN 3.0* 2.7* 2.6* 2.5* 2.3*   No results for input(s): LIPASE, AMYLASE in the last 168 hours. No results for input(s): AMMONIA in the last 168 hours. Coagulation Profile: No results for input(s): INR, PROTIME in the last 168 hours. Cardiac Enzymes: Recent Labs  Lab 09/17/17 1118  CKTOTAL 73   BNP (last 3 results) No results for input(s): PROBNP in the last 8760 hours. HbA1C: No results for input(s): HGBA1C in the last 72 hours. CBG: Recent Labs  Lab 09/17/17 1642 09/17/17 1958 09/18/17 0747 09/18/17 1213  GLUCAP 96 137* 105* 124*   Lipid Profile: No results for input(s): CHOL, HDL, LDLCALC, TRIG, CHOLHDL, LDLDIRECT in the last 72 hours. Thyroid Function Tests: No results for input(s): TSH, T4TOTAL, FREET4, T3FREE, THYROIDAB in the last 72 hours. Anemia Panel: No results for input(s): VITAMINB12, FOLATE, FERRITIN, TIBC, IRON, RETICCTPCT in the last 72 hours. Urine analysis:    Component Value Date/Time   COLORURINE YELLOW  09/17/2017 1223   APPEARANCEUR HAZY (A) 09/17/2017 1223   LABSPEC 1.011 09/17/2017 1223   PHURINE 6.0 09/17/2017 1223   GLUCOSEU NEGATIVE 09/17/2017 1223   HGBUR SMALL (A) 09/17/2017 1223   BILIRUBINUR NEGATIVE 09/17/2017 [place] 09/17/2017 1223   PROTEINUR NEGATIVE 09/17/2017 1223   NITRITE NEGATIVE 09/17/2017 1223   LEUKOCYTESUR SMALL (A) 09/17/2017 1223     Ripudeep Rai M.D. [facility] 09/20/2017,  10:55 AM  Pager: [phone] Between 7am to 7pm - call Pager - [phone]  After 7pm go to [email or web address] - password [email or web address]  Call night coverage person covering after 7pm

## 2017-09-20 NOTE — Progress Notes (Signed)
Assessment:  1 AKI, likely due to hypovolemia due to hypercalcemia, loop diuretic, decrease po intake +/- NSAID 2 Hypercalcemia of malignancy 3 Prob metastatic breast cancer(pending bx) 4 Hypokalemia-replace  Plan: 1 IVFs with NS 2) K supp   Subjective: Interval History: feels a little better  Objective: Vital signs in last 24 hours: Temp:  [97.5 F (36.4 C)-98.3 F (36.8 C)] 97.6 F (36.4 C) (05/28 1100) Pulse Rate:  [80-91] 80 (05/28 1100) Resp:  [18] 18 (05/27 1600) BP: (141-167)/(73-113) 141/84 (05/28 1100) SpO2:  [93 %-100 %] 95 % (05/28 1100) Weight:  [88.8 kg (195 lb 12.3 oz)] 88.8 kg (195 lb 12.3 oz) (05/28 0316) Weight change:   Intake/Output from previous day: 05/27 0701 - 05/28 0700 In: 5637.5 [P.O.:300; I.V.:5187.5; IV Piggyback:150] Out: 1300 [Urine:1300] Intake/Output this shift: Total I/O In: 840 [I.V.:840] Out: 500 [Urine:500]  General appearance: alert and cooperative Chest wall: no tenderness GI: soft, non-tender; bowel sounds normal; no masses,  no organomegaly Extremities: 1+ Cor RRR Lungs clear  Lab Results: Recent Labs    09/19/17 0228 09/20/17 0400  WBC 14.5* 15.0*  HGB 10.1* 9.3*  HCT 31.1* 28.8*  PLT 318 298   BMET:  Recent Labs    09/19/17 1508 09/20/17 0400  NA 135 138  K 3.2* 3.2*  CL 97* 99*  CO2 27 25  GLUCOSE 134* 125*  BUN 52* 49*  CREATININE 2.76* 2.61*  CALCIUM 12.7* 11.5*   No results for input(s): PTH in the last 72 hours. Iron Studies: No results for input(s): IRON, TIBC, TRANSFERRIN, FERRITIN in the last 72 hours. Studies/Results: Ct Abdomen Pelvis Wo Contrast  Result Date: 09/19/2017 CLINICAL DATA:  Hypercalcemia, scattered lytic bone lesions. Concern for breast cancer. EXAM: CT CHEST, ABDOMEN AND PELVIS WITHOUT CONTRAST TECHNIQUE: Multidetector CT imaging of the chest, abdomen and pelvis was performed following the standard protocol without IV contrast. COMPARISON:  09/17/2017 FINDINGS: CT CHEST  FINDINGS Cardiovascular: Unremarkable Mediastinum/Nodes: Mediastinal and quite likely bilateral hilar adenopathy noted with a lower right paratracheal lymph node measuring 2.3 cm in short axis on image 21/4. Right hilar and subcarinal adenopathy observed. In individual right axillary node measures 1.9 cm in short axis on image 24/4. Lungs/Pleura: 0.6 by 0.5 cm left upper lobe pulmonary nodule, image 69/6. There bands of atelectasis in both lower lobes. Small bilateral pleural effusions. Musculoskeletal: Infiltrative masslike appearance in the right breast. One solid component superiorly measures 4.1 by 4.2 cm on image 24/4. There is skin thickening in the right breast. Widespread lytic osseous metastatic disease is present in all visualized bony structures. An index lytic lesion of the right lower sternum measures 1.9 by 1.4 by 2.7 cm. No current pathologic vertebral collapse. There pathologic fractures of the left third, fifth, seventh, eighth, and ninth ribs; and pathologic fractures of the right third, seventh, eighth, and tenth ribs. CT ABDOMEN PELVIS FINDINGS Hepatobiliary: Unremarkable noncontrast CT appearance. Pancreas: Unremarkable Spleen: Unremarkable Adrenals/Urinary Tract: Unremarkable Stomach/Bowel: Unremarkable Vascular/Lymphatic: Unremarkable Reproductive: Calcified uterine fibroids. Other: No supplemental non-categorized findings. Musculoskeletal: Widespread lytic metastatic involvement of visualized abdomen and pelvic skeletal structures as well as the proximal femurs. IMPRESSION: 1. Right breast mass with associated cutaneous thickening, thoracic adenopathy, axillary and subpectoral adenopathy, and widespread lytic osseous metastatic disease involving virtually all bony structures seen on today's exam. 2. Pathologic rib fractures bilaterally as detailed above. 3. 6 mm left upper lobe pulmonary nodule, nonspecific. 4. Small bilateral pleural effusions, nonspecific for transudative or  exudative/malignant etiology. There are bands of atelectasis in  both lower lobes. 5. Calcified uterine fibroids. Electronically Signed   By: Van Clines M.D.   On: 09/19/2017 12:04   Ct Soft Tissue Neck Wo Contrast  Result Date: 09/19/2017 CLINICAL DATA:  Breast mass, lytic bone lesions. EXAM: CT NECK WITHOUT CONTRAST TECHNIQUE: Multidetector CT imaging of the neck was performed following the standard protocol without intravenous contrast. COMPARISON:  Radiographs from 09/17/2017 FINDINGS: Pharynx and larynx: Mild circumferential prominence of Waldeyer's ring, likely incidental. Salivary glands: Unremarkable Thyroid: Unremarkable Lymph nodes: Right level II a lymph node upper normal size at 0.9 cm on image 33/4. Right level IV lymph node pathologically enlarged at 1.4 cm in diameter on image 63/4. Right upper subpectoral adenopathy is observed. Vascular: No significant atherosclerotic calcification. Limited intracranial: Unremarkable where included. Visualized orbits: Unremarkable where included. Mastoids and visualized paranasal sinuses: Chronic right maxillary sinusitis. Periapical lucency and cavity associated with tooth # 14. Skeleton: Lytic metastatic disease anteriorly in the left temporal bone near the orbit on image 3/4; in the left occipital condyle; in the right lateral mass of C2; and encompassing much of the C5 vertebral body. There are lytic lesions at all vertebral levels and a notable lytic lesion involving the sternal manubrium. Upper chest: Deferred to CT chest from same day. Other: No supplemental non-categorized findings. IMPRESSION: 1. Widespread lytic osseous metastatic disease with the largest lesions in the neck region along the left occipital condyle, the right lateral mass of C2, and in the C5 vertebral body. All visualized bony structures possibly with the exception of the mandible appear involved. 2. Right level IV adenopathy just above the supraclavicular region, lymph node 1.4  cm in short axis. There is also right upper subpectoral adenopathy and known mediastinal adenopathy. 3. Periapical lucency and cavity associated with tooth # 14. 4. Chronic right maxillary sinusitis. Electronically Signed   By: Van Clines M.D.   On: 09/19/2017 12:18   Ct Chest Wo Contrast  Result Date: 09/19/2017 CLINICAL DATA:  Hypercalcemia, scattered lytic bone lesions. Concern for breast cancer. EXAM: CT CHEST, ABDOMEN AND PELVIS WITHOUT CONTRAST TECHNIQUE: Multidetector CT imaging of the chest, abdomen and pelvis was performed following the standard protocol without IV contrast. COMPARISON:  09/17/2017 FINDINGS: CT CHEST FINDINGS Cardiovascular: Unremarkable Mediastinum/Nodes: Mediastinal and quite likely bilateral hilar adenopathy noted with a lower right paratracheal lymph node measuring 2.3 cm in short axis on image 21/4. Right hilar and subcarinal adenopathy observed. In individual right axillary node measures 1.9 cm in short axis on image 24/4. Lungs/Pleura: 0.6 by 0.5 cm left upper lobe pulmonary nodule, image 69/6. There bands of atelectasis in both lower lobes. Small bilateral pleural effusions. Musculoskeletal: Infiltrative masslike appearance in the right breast. One solid component superiorly measures 4.1 by 4.2 cm on image 24/4. There is skin thickening in the right breast. Widespread lytic osseous metastatic disease is present in all visualized bony structures. An index lytic lesion of the right lower sternum measures 1.9 by 1.4 by 2.7 cm. No current pathologic vertebral collapse. There pathologic fractures of the left third, fifth, seventh, eighth, and ninth ribs; and pathologic fractures of the right third, seventh, eighth, and tenth ribs. CT ABDOMEN PELVIS FINDINGS Hepatobiliary: Unremarkable noncontrast CT appearance. Pancreas: Unremarkable Spleen: Unremarkable Adrenals/Urinary Tract: Unremarkable Stomach/Bowel: Unremarkable Vascular/Lymphatic: Unremarkable Reproductive: Calcified  uterine fibroids. Other: No supplemental non-categorized findings. Musculoskeletal: Widespread lytic metastatic involvement of visualized abdomen and pelvic skeletal structures as well as the proximal femurs. IMPRESSION: 1. Right breast mass with associated cutaneous thickening, thoracic adenopathy, axillary and subpectoral  adenopathy, and widespread lytic osseous metastatic disease involving virtually all bony structures seen on today's exam. 2. Pathologic rib fractures bilaterally as detailed above. 3. 6 mm left upper lobe pulmonary nodule, nonspecific. 4. Small bilateral pleural effusions, nonspecific for transudative or exudative/malignant etiology. There are bands of atelectasis in both lower lobes. 5. Calcified uterine fibroids. Electronically Signed   By: Van Clines M.D.   On: 09/19/2017 12:04   Mr Brain Wo Contrast  Result Date: 09/19/2017 CLINICAL DATA:  Unexplained alteration of consciousness. Physical exam as well as cross-sectional imaging findings concerning for metastatic breast cancer. EXAM: MRI HEAD WITHOUT CONTRAST TECHNIQUE: Multiplanar, multiecho pulse sequences of the brain and surrounding structures were obtained without intravenous contrast. COMPARISON:  CT neck without contrast earlier today. CT head 09/17/2017. FINDINGS: Brain: No acute stroke, acute hemorrhage, intra-axial mass lesion, hydrocephalus, or extra-axial fluid. Mild atrophy. Extensive T2 and FLAIR hyperintensities throughout the white matter, more focal and confluent, favored to represent chronic microvascular ischemic change. No definite intracranial metastatic disease. While this would be optimally evaluated with MultiHance, the patient cannot have gadolinium due to renal failure. Vascular: Flow voids are maintained. Skull and upper cervical spine: There are multiple metastatic lesions in the calvarium, better demonstrated on CT due to their small size. There is an aggressive osseous metastasis of C1 on the RIGHT.  Sinuses/Orbits: RIGHT maxillary sinus layering fluid suggesting acuity. Otherwise clear. Negative orbits. Other: None. IMPRESSION: MRI brain demonstrating no definite brain metastases, acute stroke, or other significant finding. Widespread white matter lesions, likely chronic microvascular ischemic change. Widespread osseous metastatic disease, most notably affecting C1 on the RIGHT but also the calvarium. No spinal cord compression or intracranial mass lesion Electronically Signed   By: Staci Righter M.D.   On: 09/19/2017 16:47    Scheduled: . amLODipine  5 mg Oral Daily  . heparin  5,000 Units Subcutaneous Q8H  . labetalol  100 mg Oral BID    LOS: 3 days   Estanislado Emms 09/20/2017,2:18 PM

## 2017-09-20 NOTE — Plan of Care (Signed)
  Problem: Health Behavior/Discharge Planning: Goal: Ability to manage health-related needs will improve Outcome: Progressing   Problem: Clinical Measurements: Goal: Ability to maintain clinical measurements within normal limits will improve Outcome: Progressing Goal: Will remain free from infection Outcome: Progressing Goal: Respiratory complications will improve Outcome: Progressing Goal: Cardiovascular complication will be avoided Outcome: Progressing   Problem: Activity: Goal: Risk for activity intolerance will decrease Outcome: Progressing   Problem: Coping: Goal: Level of anxiety will decrease Outcome: Progressing   Problem: Elimination: Goal: Will not experience complications related to bowel motility Outcome: Progressing Goal: Will not experience complications related to urinary retention Outcome: Progressing   Problem: Safety: Goal: Ability to remain free from injury will improve Outcome: Progressing   Problem: Skin Integrity: Goal: Risk for impaired skin integrity will decrease Outcome: Progressing   Problem: Spiritual Needs Goal: Ability to function at adequate level Outcome: Progressing

## 2017-09-20 NOTE — Progress Notes (Addendum)
Patient sleeping but easy to arouse with verbal stimulus.  Patient requested applesauce didn't eat much of dinner meal.  Denies pain or SOB.  Sister at the bedside no concerns voiced at this time.  Will monitor.

## 2017-09-21 ENCOUNTER — Inpatient Hospital Stay (HOSPITAL_COMMUNITY): Payer: Medicare Other

## 2017-09-21 DIAGNOSIS — E876 Hypokalemia: Secondary | ICD-10-CM

## 2017-09-21 DIAGNOSIS — N179 Acute kidney failure, unspecified: Secondary | ICD-10-CM

## 2017-09-21 DIAGNOSIS — I10 Essential (primary) hypertension: Secondary | ICD-10-CM

## 2017-09-21 DIAGNOSIS — N63 Unspecified lump in unspecified breast: Secondary | ICD-10-CM

## 2017-09-21 DIAGNOSIS — D649 Anemia, unspecified: Secondary | ICD-10-CM

## 2017-09-21 LAB — PROTEIN ELECTROPHORESIS, SERUM
A/G RATIO SPE: 0.8 (ref 0.7–1.7)
ALPHA-2-GLOBULIN: 0.9 g/dL (ref 0.4–1.0)
Albumin ELP: 3 g/dL (ref 2.9–4.4)
Alpha-1-Globulin: 0.5 g/dL — ABNORMAL HIGH (ref 0.0–0.4)
BETA GLOBULIN: 1.3 g/dL (ref 0.7–1.3)
GAMMA GLOBULIN: 1.3 g/dL (ref 0.4–1.8)
Globulin, Total: 4 g/dL — ABNORMAL HIGH (ref 2.2–3.9)
Total Protein ELP: 7 g/dL (ref 6.0–8.5)

## 2017-09-21 LAB — CBC
HEMATOCRIT: 27.4 % — AB (ref 36.0–46.0)
Hemoglobin: 9 g/dL — ABNORMAL LOW (ref 12.0–15.0)
MCH: 27.9 pg (ref 26.0–34.0)
MCHC: 32.8 g/dL (ref 30.0–36.0)
MCV: 84.8 fL (ref 78.0–100.0)
Platelets: 286 10*3/uL (ref 150–400)
RBC: 3.23 MIL/uL — ABNORMAL LOW (ref 3.87–5.11)
RDW: 13.5 % (ref 11.5–15.5)
WBC: 15 10*3/uL — ABNORMAL HIGH (ref 4.0–10.5)

## 2017-09-21 LAB — PROTIME-INR
INR: 1.39
PROTHROMBIN TIME: 17 s — AB (ref 11.4–15.2)

## 2017-09-21 LAB — COMPREHENSIVE METABOLIC PANEL
ALBUMIN: 2.3 g/dL — AB (ref 3.5–5.0)
ALK PHOS: 95 U/L (ref 38–126)
ALT: 25 U/L (ref 14–54)
AST: 33 U/L (ref 15–41)
Anion gap: 12 (ref 5–15)
BILIRUBIN TOTAL: 1.1 mg/dL (ref 0.3–1.2)
BUN: 41 mg/dL — AB (ref 6–20)
CALCIUM: 10.6 mg/dL — AB (ref 8.9–10.3)
CO2: 24 mmol/L (ref 22–32)
Chloride: 102 mmol/L (ref 101–111)
Creatinine, Ser: 2.2 mg/dL — ABNORMAL HIGH (ref 0.44–1.00)
GFR calc Af Amer: 28 mL/min — ABNORMAL LOW (ref >60)
GFR calc non Af Amer: 24 mL/min — ABNORMAL LOW (ref >60)
GLUCOSE: 158 mg/dL — AB (ref 65–99)
Potassium: 2.8 mmol/L — ABNORMAL LOW (ref 3.5–5.1)
SODIUM: 138 mmol/L (ref 135–145)
Total Protein: 5.9 g/dL — ABNORMAL LOW (ref 6.5–8.1)

## 2017-09-21 LAB — MAGNESIUM: Magnesium: 1.3 mg/dL — ABNORMAL LOW (ref 1.7–2.4)

## 2017-09-21 MED ORDER — LIDOCAINE HCL (PF) 1 % IJ SOLN
INTRAMUSCULAR | Status: AC
  Filled 2017-09-21: qty 30

## 2017-09-21 MED ORDER — POTASSIUM CHLORIDE 10 MEQ/100ML IV SOLN
10.0000 meq | INTRAVENOUS | Status: AC
  Administered 2017-09-21 (×6): 10 meq via INTRAVENOUS
  Filled 2017-09-21 (×6): qty 100

## 2017-09-21 MED ORDER — MIDAZOLAM HCL 2 MG/2ML IJ SOLN
INTRAMUSCULAR | Status: AC | PRN
  Administered 2017-09-21: 1 mg via INTRAVENOUS

## 2017-09-21 MED ORDER — MIDAZOLAM HCL 2 MG/2ML IJ SOLN
INTRAMUSCULAR | Status: AC
  Filled 2017-09-21: qty 4

## 2017-09-21 MED ORDER — FENTANYL CITRATE (PF) 100 MCG/2ML IJ SOLN
INTRAMUSCULAR | Status: AC
  Filled 2017-09-21: qty 4

## 2017-09-21 MED ORDER — POTASSIUM CHLORIDE 20 MEQ PO PACK
40.0000 meq | PACK | Freq: Two times a day (BID) | ORAL | Status: AC
  Administered 2017-09-21 (×2): 40 meq via ORAL
  Filled 2017-09-21 (×2): qty 2

## 2017-09-21 MED ORDER — HEPARIN SODIUM (PORCINE) 5000 UNIT/ML IJ SOLN
5000.0000 [IU] | Freq: Three times a day (TID) | INTRAMUSCULAR | Status: DC
  Administered 2017-09-21 – 2017-09-26 (×14): 5000 [IU] via SUBCUTANEOUS
  Filled 2017-09-21 (×14): qty 1

## 2017-09-21 MED ORDER — FENTANYL CITRATE (PF) 100 MCG/2ML IJ SOLN
INTRAMUSCULAR | Status: AC | PRN
  Administered 2017-09-21: 50 ug via INTRAVENOUS
  Administered 2017-09-21: 25 ug via INTRAVENOUS

## 2017-09-21 NOTE — Progress Notes (Signed)
Patient worked up for possible bone marrow biopsy over the weekend, now with new evidence of breast cancer which may explain her symptoms and presentation.  Request now for breast mass biopsy.  Images reviewed by Dr. Earleen Newport who approves patient for procedure.   PA discussed with patient and sister at bedside.  New consent obtained.  She has been NPO today.   Brynda Greathouse, MS RD PA-C 9:22 AM

## 2017-09-21 NOTE — Progress Notes (Signed)
[facility] PROGRESS NOTE  Olivia Patel QZR:[id number] DOB: [birth year] DOA: 09/17/2017  PCP: Lance Sell, NP  Brief History/Interval Summary: 57 year old female with hypertension, anemia, arthritis presented with progressive 1 week history of generalized weakness, fall on the morning of admission.  Patient's daughter also reported slight confusion following the fall.  Patient had decreased concentration, no stupor, polydipsia, polyuria. Labs showed potassium 2.8, chloride 84, bicarb 37, creatinine 1.85, calcium greater than 15, magnesium 1.4.  Patient was hospitalized for further management.  Reason for Visit: Severe hypercalcemia  Consultants: Oncology: Dr. Benay Spice.  Interventional radiology  Procedures: Plan is for biopsy of the breast mass to be performed today  Antibiotics: IV vancomycin from 5/26 to 5/28 On IV Zosyn  Subjective/Interval History: Patient denies any nausea vomiting.  She does mention swelling of her arms and legs.  Denies any chest pain.  No shortness of breath.  Her sister is at the bedside.  Her mental status has improved according to sister.  ROS: Denies any headaches  Objective:  Vital Signs  Vitals:   09/21/17 0354 09/21/17 0639 09/21/17 0700 09/21/17 0800  BP: (!) 167/75  (!) 168/81 (!) 168/81  Pulse: 96  96 97  Resp: 18     Temp: 97.8 F (36.6 C)  98.9 F (37.2 C)   TempSrc: Oral  Oral   SpO2: 96%  95% 93%  Weight:  89.8 kg (197 lb 15.6 oz)    Height:        Intake/Output Summary (Last 24 hours) at 09/21/2017 1100 Last data filed at 09/21/2017 0930 Gross per 24 hour  Intake 1590 ml  Output 1500 ml  Net 90 ml   Filed Weights   09/17/17 2046 09/20/17 0316 09/21/17 0639  Weight: 86.1 kg (189 lb 13.1 oz) 88.8 kg (195 lb 12.3 oz) 89.8 kg (197 lb 15.6 oz)    General appearance: alert, cooperative, appears stated age, distracted and no distress Head: Normocephalic, without obvious abnormality, atraumatic Resp: clear to  auscultation bilaterally Cardio: regular rate and rhythm, S1, S2 normal, no murmur, click, rub or gallop GI: soft, non-tender; bowel sounds normal; no masses,  no organomegaly Extremities: Edema noted bilateral upper and lower extremities. Pulses: 2+ and symmetric Neurologic: No focal deficits  Lab Results:  Data Reviewed: I have personally reviewed following labs and imaging studies  CBC: Recent Labs  Lab 09/17/17 1118 09/18/17 0542 09/19/17 0228 09/20/17 0400 09/21/17 0405  WBC 16.3* 15.8* 14.5* 15.0* 15.0*  NEUTROABS 13.3*  --   --   --   --   HGB 12.0 11.0* 10.1* 9.3* 9.0*  HCT 36.5 34.1* 31.1* 28.8* 27.4*  MCV 84.5 85.5 85.2 85.7 84.8  PLT 344 340 318 298 354    Basic Metabolic Panel: Recent Labs  Lab 09/17/17 1118  09/18/17 2027 09/19/17 0228 09/19/17 1508 09/20/17 0400 09/21/17 0405  NA 132*   < > 132* 136 135 138 138  K 2.8*   < > 3.1* 3.2* 3.2* 3.2* 2.8*  CL 84*   < > 92* 95* 97* 99* 102  CO2 37*   < > 27 30 27 25 24   GLUCOSE 119*   < > 133* 164* 134* 125* 158*  BUN 42*   < > 44* 47* 52* 49* 41*  CREATININE 1.85*   < > 2.32* 2.55* 2.76* 2.61* 2.20*  CALCIUM >15.0*   < > 14.5* 13.9* 12.7* 11.5* 10.6*  MG 1.4*  --   --   --   --   --  1.3*   < > = values in this interval not displayed.    GFR: Estimated Creatinine Clearance: 31.1 mL/min (A) (by C-G formula based on SCr of 2.2 mg/dL (H)).  Liver Function Tests: Recent Labs  Lab 09/18/17 0542 09/18/17 2027 09/19/17 0228 09/20/17 0400 09/21/17 0405  AST 32 34 32 30 33  ALT 23 25 25 25 25   ALKPHOS 113 106 101 90 95  BILITOT 1.0 1.3* 1.3* 1.2 1.1  PROT 6.8 6.5 6.5 6.2* 5.9*  ALBUMIN 2.7* 2.6* 2.5* 2.3* 2.3*    Coagulation Profile: Recent Labs  Lab 09/21/17 0405  INR 1.39    Cardiac Enzymes: Recent Labs  Lab 09/17/17 1118  CKTOTAL 73    CBG: Recent Labs  Lab 09/17/17 1642 09/17/17 1958 09/18/17 0747 09/18/17 1213  GLUCAP 96 137* 105* 124*     Recent Results (from the past  240 hour(s))  Culture, blood (Routine X 2) w Reflex to ID Panel     Status: None (Preliminary result)   Collection Time: 09/18/17  1:31 PM  Result Value Ref Range Status   Specimen Description BLOOD RIGHT HAND  Final   Special Requests   Final    BOTTLES DRAWN AEROBIC AND ANAEROBIC Blood Culture adequate volume   Culture   Final    NO GROWTH 2 DAYS Performed at [facility], [place] [address] [place], [place] [address]    Report Status PENDING  Incomplete  Culture, blood (Routine X 2) w Reflex to ID Panel     Status: None (Preliminary result)   Collection Time: 09/18/17  1:31 PM  Result Value Ref Range Status   Specimen Description BLOOD RIGHT WRIST  Final   Special Requests   Final    BOTTLES DRAWN AEROBIC AND ANAEROBIC Blood Culture adequate volume   Culture   Final    NO GROWTH 2 DAYS Performed at [facility], [place] [address], [place], [place] [address]    Report Status PENDING  Incomplete  MRSA PCR Screening     Status: None   Collection Time: 09/20/17  1:58 PM  Result Value Ref Range Status   MRSA by PCR NEGATIVE NEGATIVE Final    Comment:        The GeneXpert MRSA Assay (FDA approved for NASAL specimens only), is one component of a comprehensive MRSA colonization surveillance program. It is not intended to diagnose MRSA infection nor to guide or monitor treatment for MRSA infections. Performed at [facility], [place] [address] [place], [place] [address]       Radiology Studies: Ct Abdomen Pelvis Wo Contrast  Result Date: 09/19/2017 CLINICAL DATA:  Hypercalcemia, scattered lytic bone lesions. Concern for breast cancer. EXAM: CT CHEST, ABDOMEN AND PELVIS WITHOUT CONTRAST TECHNIQUE: Multidetector CT imaging of the chest, abdomen and pelvis was performed following the standard protocol without IV contrast. COMPARISON:  09/17/2017 FINDINGS: CT CHEST FINDINGS Cardiovascular: Unremarkable Mediastinum/Nodes: Mediastinal and quite likely bilateral hilar  adenopathy noted with a lower right paratracheal lymph node measuring 2.3 cm in short axis on image 21/4. Right hilar and subcarinal adenopathy observed. In individual right axillary node measures 1.9 cm in short axis on image 24/4. Lungs/Pleura: 0.6 by 0.5 cm left upper lobe pulmonary nodule, image 69/6. There bands of atelectasis in both lower lobes. Small bilateral pleural effusions. Musculoskeletal: Infiltrative masslike appearance in the right breast. One solid component superiorly measures 4.1 by 4.2 cm on image 24/4. There is skin thickening in the right breast.  Widespread lytic osseous metastatic disease is present in all visualized bony structures. An index lytic lesion of the right lower sternum measures 1.9 by 1.4 by 2.7 cm. No current pathologic vertebral collapse. There pathologic fractures of the left third, fifth, seventh, eighth, and ninth ribs; and pathologic fractures of the right third, seventh, eighth, and tenth ribs. CT ABDOMEN PELVIS FINDINGS Hepatobiliary: Unremarkable noncontrast CT appearance. Pancreas: Unremarkable Spleen: Unremarkable Adrenals/Urinary Tract: Unremarkable Stomach/Bowel: Unremarkable Vascular/Lymphatic: Unremarkable Reproductive: Calcified uterine fibroids. Other: No supplemental non-categorized findings. Musculoskeletal: Widespread lytic metastatic involvement of visualized abdomen and pelvic skeletal structures as well as the proximal femurs. IMPRESSION: 1. Right breast mass with associated cutaneous thickening, thoracic adenopathy, axillary and subpectoral adenopathy, and widespread lytic osseous metastatic disease involving virtually all bony structures seen on today's exam. 2. Pathologic rib fractures bilaterally as detailed above. 3. 6 mm left upper lobe pulmonary nodule, nonspecific. 4. Small bilateral pleural effusions, nonspecific for transudative or exudative/malignant etiology. There are bands of atelectasis in both lower lobes. 5. Calcified uterine fibroids.  Electronically Signed   By: Van Clines M.D.   On: 09/19/2017 12:04   Ct Soft Tissue Neck Wo Contrast  Result Date: 09/19/2017 CLINICAL DATA:  Breast mass, lytic bone lesions. EXAM: CT NECK WITHOUT CONTRAST TECHNIQUE: Multidetector CT imaging of the neck was performed following the standard protocol without intravenous contrast. COMPARISON:  Radiographs from 09/17/2017 FINDINGS: Pharynx and larynx: Mild circumferential prominence of Waldeyer's ring, likely incidental. Salivary glands: Unremarkable Thyroid: Unremarkable Lymph nodes: Right level II a lymph node upper normal size at 0.9 cm on image 33/4. Right level IV lymph node pathologically enlarged at 1.4 cm in diameter on image 63/4. Right upper subpectoral adenopathy is observed. Vascular: No significant atherosclerotic calcification. Limited intracranial: Unremarkable where included. Visualized orbits: Unremarkable where included. Mastoids and visualized paranasal sinuses: Chronic right maxillary sinusitis. Periapical lucency and cavity associated with tooth # 14. Skeleton: Lytic metastatic disease anteriorly in the left temporal bone near the orbit on image 3/4; in the left occipital condyle; in the right lateral mass of C2; and encompassing much of the C5 vertebral body. There are lytic lesions at all vertebral levels and a notable lytic lesion involving the sternal manubrium. Upper chest: Deferred to CT chest from same day. Other: No supplemental non-categorized findings. IMPRESSION: 1. Widespread lytic osseous metastatic disease with the largest lesions in the neck region along the left occipital condyle, the right lateral mass of C2, and in the C5 vertebral body. All visualized bony structures possibly with the exception of the mandible appear involved. 2. Right level IV adenopathy just above the supraclavicular region, lymph node 1.4 cm in short axis. There is also right upper subpectoral adenopathy and known mediastinal adenopathy. 3.  Periapical lucency and cavity associated with tooth # 14. 4. Chronic right maxillary sinusitis. Electronically Signed   By: Van Clines M.D.   On: 09/19/2017 12:18   Ct Chest Wo Contrast  Result Date: 09/19/2017 CLINICAL DATA:  Hypercalcemia, scattered lytic bone lesions. Concern for breast cancer. EXAM: CT CHEST, ABDOMEN AND PELVIS WITHOUT CONTRAST TECHNIQUE: Multidetector CT imaging of the chest, abdomen and pelvis was performed following the standard protocol without IV contrast. COMPARISON:  09/17/2017 FINDINGS: CT CHEST FINDINGS Cardiovascular: Unremarkable Mediastinum/Nodes: Mediastinal and quite likely bilateral hilar adenopathy noted with a lower right paratracheal lymph node measuring 2.3 cm in short axis on image 21/4. Right hilar and subcarinal adenopathy observed. In individual right axillary node measures 1.9 cm in short axis on image 24/4. Lungs/Pleura: 0.6  by 0.5 cm left upper lobe pulmonary nodule, image 69/6. There bands of atelectasis in both lower lobes. Small bilateral pleural effusions. Musculoskeletal: Infiltrative masslike appearance in the right breast. One solid component superiorly measures 4.1 by 4.2 cm on image 24/4. There is skin thickening in the right breast. Widespread lytic osseous metastatic disease is present in all visualized bony structures. An index lytic lesion of the right lower sternum measures 1.9 by 1.4 by 2.7 cm. No current pathologic vertebral collapse. There pathologic fractures of the left third, fifth, seventh, eighth, and ninth ribs; and pathologic fractures of the right third, seventh, eighth, and tenth ribs. CT ABDOMEN PELVIS FINDINGS Hepatobiliary: Unremarkable noncontrast CT appearance. Pancreas: Unremarkable Spleen: Unremarkable Adrenals/Urinary Tract: Unremarkable Stomach/Bowel: Unremarkable Vascular/Lymphatic: Unremarkable Reproductive: Calcified uterine fibroids. Other: No supplemental non-categorized findings. Musculoskeletal: Widespread lytic  metastatic involvement of visualized abdomen and pelvic skeletal structures as well as the proximal femurs. IMPRESSION: 1. Right breast mass with associated cutaneous thickening, thoracic adenopathy, axillary and subpectoral adenopathy, and widespread lytic osseous metastatic disease involving virtually all bony structures seen on today's exam. 2. Pathologic rib fractures bilaterally as detailed above. 3. 6 mm left upper lobe pulmonary nodule, nonspecific. 4. Small bilateral pleural effusions, nonspecific for transudative or exudative/malignant etiology. There are bands of atelectasis in both lower lobes. 5. Calcified uterine fibroids. Electronically Signed   By: Van Clines M.D.   On: 09/19/2017 12:04   Mr Brain Wo Contrast  Result Date: 09/19/2017 CLINICAL DATA:  Unexplained alteration of consciousness. Physical exam as well as cross-sectional imaging findings concerning for metastatic breast cancer. EXAM: MRI HEAD WITHOUT CONTRAST TECHNIQUE: Multiplanar, multiecho pulse sequences of the brain and surrounding structures were obtained without intravenous contrast. COMPARISON:  CT neck without contrast earlier today. CT head 09/17/2017. FINDINGS: Brain: No acute stroke, acute hemorrhage, intra-axial mass lesion, hydrocephalus, or extra-axial fluid. Mild atrophy. Extensive T2 and FLAIR hyperintensities throughout the white matter, more focal and confluent, favored to represent chronic microvascular ischemic change. No definite intracranial metastatic disease. While this would be optimally evaluated with MultiHance, the patient cannot have gadolinium due to renal failure. Vascular: Flow voids are maintained. Skull and upper cervical spine: There are multiple metastatic lesions in the calvarium, better demonstrated on CT due to their small size. There is an aggressive osseous metastasis of C1 on the RIGHT. Sinuses/Orbits: RIGHT maxillary sinus layering fluid suggesting acuity. Otherwise clear. Negative  orbits. Other: None. IMPRESSION: MRI brain demonstrating no definite brain metastases, acute stroke, or other significant finding. Widespread white matter lesions, likely chronic microvascular ischemic change. Widespread osseous metastatic disease, most notably affecting C1 on the RIGHT but also the calvarium. No spinal cord compression or intracranial mass lesion Electronically Signed   By: Staci Righter M.D.   On: 09/19/2017 16:47     Medications:  Scheduled: . amLODipine  5 mg Oral Daily  . heparin  5,000 Units Subcutaneous Q8H  . labetalol  100 mg Oral BID  . potassium chloride  40 mEq Oral BID   Continuous: . sodium chloride 150 mL/hr at 09/21/17 0520  . piperacillin-tazobactam (ZOSYN)  IV Stopped (09/21/17 0930)  . potassium chloride 10 mEq (09/21/17 1048)  . sodium chloride     OZD:[id number] **OR** acetaminophen, bisacodyl, hydrALAZINE, HYDROcodone-acetaminophen, ondansetron **OR** ondansetron (ZOFRAN) IV, senna-docusate  Assessment/Plan:    Severe hypercalcemia Likely secondary to malignancy.  Patient is found to have a mass in the right breast and is found to have multiple adenopathy on imaging studies.  This is concerning for malignant  breast cancer.  Patient was given IV fluids and pamidronate.  She also received Lasix.  She was given 4 doses of calcitonin.  Calcium level has improved.   Right breast mass with adenopathy Concern for metastatic breast cancer.  Oncology is following.  Plan is for biopsy of the right breast mass hopefully today.  MRI brain negative for brain metastases.  Hypokalemia This is being repleted.  Recheck tomorrow.  Acute kidney injury Thought to be secondary to severe dehydration and hypercalcemia and ATN.  Nephrology was consulted.  Patient started on IV fluids.  She has good urine output.  Creatinine is improving.  Cut back on the rate of IV fluids since she is getting edematous.  Monitor urine output.  Acute respiratory failure with  hypoxia and sepsis On 5/26 patient had tachypnea altered mental status and fevers.  Procalcitonin 0.5.  Chest x-ray showed bilateral pleural effusion and bibasilar hazy atelectasis versus airspace disease.  Patient was started on IV vancomycin and Zosyn.  Blood cultures remain negative.  Vancomycin was discontinued.  She could have aspirated as she was encephalopathic.  Speech therapy to see.  Sepsis physiology has improved.  Normocytic anemia Downward trending hemoglobin noted.  No evidence for overt bleeding.  Continue to monitor.  Edema involving upper and lower extremities Likely multifactorial including hypoalbuminemia, IV fluids.  Cut back on fluids.  Improve nutrition.  Acute metabolic encephalopathy Likely secondary to hypercalcemia.  MRI brain negative for any metastatic process.  Mental status appears to be close to baseline.  Essential hypertension Blood pressure remains suboptimally controlled.  Continue current medications for now.  Monitor closely.  DVT Prophylaxis: Subcutaneous heparin    Code Status: Full code Family Communication: Discussed with the patient and her sister Disposition Plan: Management as outlined above.  Await biopsy.  PT and OT.    LOS: 4 days   [facility] Pager [phone] 09/21/2017, 11:00 AM  If 7PM-7AM, please contact night-coverage at [email or web address], password [facility]

## 2017-09-21 NOTE — Progress Notes (Signed)
SLP Cancellation Note  Patient Details Name: Olivia Patel MRN: [id number] DOB: [birth year]   Cancelled treatment:       Reason Eval/Treat Not Completed: Medical issues which prohibited therapy. Pt is currently NPO for procedure later today. RN reports no difficulty swallowing. SLP will continue efforts to complete BSE.  Lakya Schrupp B. Quentin Ore [facility], CCC-SLP Speech Language Pathologist [phone]  Shonna Chock 09/21/2017, 12:49 PM

## 2017-09-21 NOTE — Progress Notes (Signed)
IP PROGRESS NOTE  Subjective:   She is alert.  No complaint.  Her sister is at the bedside.  Objective: Vital signs in last 24 hours: Blood pressure (!) 167/75, pulse 96, temperature 97.8 F (36.6 C), temperature source Oral, resp. rate 18, height '5\' 3"'$  (1.6 m), weight 197 lb 15.6 oz (89.8 kg), last menstrual period 05/29/2015, SpO2 96 %.  Intake/Output from previous day: 05/28 0701 - 05/29 0700 In: 1710 [P.O.:720; I.V.:840; IV Piggyback:150] Out: 2000 [Urine:2000]  Physical Exam: Not performed today   Lab Results: Recent Labs    09/20/17 0400 09/21/17 0405  WBC 15.0* 15.0*  HGB 9.3* 9.0*  HCT 28.8* 27.4*  PLT 298 286    BMET Recent Labs    09/20/17 0400 09/21/17 0405  NA 138 138  K 3.2* 2.8*  CL 99* 102  CO2 25 24  GLUCOSE 125* 158*  BUN 49* 41*  CREATININE 2.61* 2.20*  CALCIUM 11.5* 10.6*     Studies/Results: Ct Abdomen Pelvis Wo Contrast  Result Date: 09/19/2017 CLINICAL DATA:  Hypercalcemia, scattered lytic bone lesions. Concern for breast cancer. EXAM: CT CHEST, ABDOMEN AND PELVIS WITHOUT CONTRAST TECHNIQUE: Multidetector CT imaging of the chest, abdomen and pelvis was performed following the standard protocol without IV contrast. COMPARISON:  09/17/2017 FINDINGS: CT CHEST FINDINGS Cardiovascular: Unremarkable Mediastinum/Nodes: Mediastinal and quite likely bilateral hilar adenopathy noted with a lower right paratracheal lymph node measuring 2.3 cm in short axis on image 21/4. Right hilar and subcarinal adenopathy observed. In individual right axillary node measures 1.9 cm in short axis on image 24/4. Lungs/Pleura: 0.6 by 0.5 cm left upper lobe pulmonary nodule, image 69/6. There bands of atelectasis in both lower lobes. Small bilateral pleural effusions. Musculoskeletal: Infiltrative masslike appearance in the right breast. One solid component superiorly measures 4.1 by 4.2 cm on image 24/4. There is skin thickening in the right breast. Widespread lytic osseous  metastatic disease is present in all visualized bony structures. An index lytic lesion of the right lower sternum measures 1.9 by 1.4 by 2.7 cm. No current pathologic vertebral collapse. There pathologic fractures of the left third, fifth, seventh, eighth, and ninth ribs; and pathologic fractures of the right third, seventh, eighth, and tenth ribs. CT ABDOMEN PELVIS FINDINGS Hepatobiliary: Unremarkable noncontrast CT appearance. Pancreas: Unremarkable Spleen: Unremarkable Adrenals/Urinary Tract: Unremarkable Stomach/Bowel: Unremarkable Vascular/Lymphatic: Unremarkable Reproductive: Calcified uterine fibroids. Other: No supplemental non-categorized findings. Musculoskeletal: Widespread lytic metastatic involvement of visualized abdomen and pelvic skeletal structures as well as the proximal femurs. IMPRESSION: 1. Right breast mass with associated cutaneous thickening, thoracic adenopathy, axillary and subpectoral adenopathy, and widespread lytic osseous metastatic disease involving virtually all bony structures seen on today's exam. 2. Pathologic rib fractures bilaterally as detailed above. 3. 6 mm left upper lobe pulmonary nodule, nonspecific. 4. Small bilateral pleural effusions, nonspecific for transudative or exudative/malignant etiology. There are bands of atelectasis in both lower lobes. 5. Calcified uterine fibroids. Electronically Signed   By: Van Clines M.D.   On: 09/19/2017 12:04   Ct Soft Tissue Neck Wo Contrast  Result Date: 09/19/2017 CLINICAL DATA:  Breast mass, lytic bone lesions. EXAM: CT NECK WITHOUT CONTRAST TECHNIQUE: Multidetector CT imaging of the neck was performed following the standard protocol without intravenous contrast. COMPARISON:  Radiographs from 09/17/2017 FINDINGS: Pharynx and larynx: Mild circumferential prominence of Waldeyer's ring, likely incidental. Salivary glands: Unremarkable Thyroid: Unremarkable Lymph nodes: Right level II a lymph node upper normal size at 0.9 cm  on image 33/4. Right level IV lymph node pathologically  enlarged at 1.4 cm in diameter on image 63/4. Right upper subpectoral adenopathy is observed. Vascular: No significant atherosclerotic calcification. Limited intracranial: Unremarkable where included. Visualized orbits: Unremarkable where included. Mastoids and visualized paranasal sinuses: Chronic right maxillary sinusitis. Periapical lucency and cavity associated with tooth # 14. Skeleton: Lytic metastatic disease anteriorly in the left temporal bone near the orbit on image 3/4; in the left occipital condyle; in the right lateral mass of C2; and encompassing much of the C5 vertebral body. There are lytic lesions at all vertebral levels and a notable lytic lesion involving the sternal manubrium. Upper chest: Deferred to CT chest from same day. Other: No supplemental non-categorized findings. IMPRESSION: 1. Widespread lytic osseous metastatic disease with the largest lesions in the neck region along the left occipital condyle, the right lateral mass of C2, and in the C5 vertebral body. All visualized bony structures possibly with the exception of the mandible appear involved. 2. Right level IV adenopathy just above the supraclavicular region, lymph node 1.4 cm in short axis. There is also right upper subpectoral adenopathy and known mediastinal adenopathy. 3. Periapical lucency and cavity associated with tooth # 14. 4. Chronic right maxillary sinusitis. Electronically Signed   By: Van Clines M.D.   On: 09/19/2017 12:18   Ct Chest Wo Contrast  Result Date: 09/19/2017 CLINICAL DATA:  Hypercalcemia, scattered lytic bone lesions. Concern for breast cancer. EXAM: CT CHEST, ABDOMEN AND PELVIS WITHOUT CONTRAST TECHNIQUE: Multidetector CT imaging of the chest, abdomen and pelvis was performed following the standard protocol without IV contrast. COMPARISON:  09/17/2017 FINDINGS: CT CHEST FINDINGS Cardiovascular: Unremarkable Mediastinum/Nodes: Mediastinal  and quite likely bilateral hilar adenopathy noted with a lower right paratracheal lymph node measuring 2.3 cm in short axis on image 21/4. Right hilar and subcarinal adenopathy observed. In individual right axillary node measures 1.9 cm in short axis on image 24/4. Lungs/Pleura: 0.6 by 0.5 cm left upper lobe pulmonary nodule, image 69/6. There bands of atelectasis in both lower lobes. Small bilateral pleural effusions. Musculoskeletal: Infiltrative masslike appearance in the right breast. One solid component superiorly measures 4.1 by 4.2 cm on image 24/4. There is skin thickening in the right breast. Widespread lytic osseous metastatic disease is present in all visualized bony structures. An index lytic lesion of the right lower sternum measures 1.9 by 1.4 by 2.7 cm. No current pathologic vertebral collapse. There pathologic fractures of the left third, fifth, seventh, eighth, and ninth ribs; and pathologic fractures of the right third, seventh, eighth, and tenth ribs. CT ABDOMEN PELVIS FINDINGS Hepatobiliary: Unremarkable noncontrast CT appearance. Pancreas: Unremarkable Spleen: Unremarkable Adrenals/Urinary Tract: Unremarkable Stomach/Bowel: Unremarkable Vascular/Lymphatic: Unremarkable Reproductive: Calcified uterine fibroids. Other: No supplemental non-categorized findings. Musculoskeletal: Widespread lytic metastatic involvement of visualized abdomen and pelvic skeletal structures as well as the proximal femurs. IMPRESSION: 1. Right breast mass with associated cutaneous thickening, thoracic adenopathy, axillary and subpectoral adenopathy, and widespread lytic osseous metastatic disease involving virtually all bony structures seen on today's exam. 2. Pathologic rib fractures bilaterally as detailed above. 3. 6 mm left upper lobe pulmonary nodule, nonspecific. 4. Small bilateral pleural effusions, nonspecific for transudative or exudative/malignant etiology. There are bands of atelectasis in both lower lobes. 5.  Calcified uterine fibroids. Electronically Signed   By: Van Clines M.D.   On: 09/19/2017 12:04   Mr Brain Wo Contrast  Result Date: 09/19/2017 CLINICAL DATA:  Unexplained alteration of consciousness. Physical exam as well as cross-sectional imaging findings concerning for metastatic breast cancer. EXAM: MRI HEAD WITHOUT CONTRAST TECHNIQUE:  Multiplanar, multiecho pulse sequences of the brain and surrounding structures were obtained without intravenous contrast. COMPARISON:  CT neck without contrast earlier today. CT head 09/17/2017. FINDINGS: Brain: No acute stroke, acute hemorrhage, intra-axial mass lesion, hydrocephalus, or extra-axial fluid. Mild atrophy. Extensive T2 and FLAIR hyperintensities throughout the white matter, more focal and confluent, favored to represent chronic microvascular ischemic change. No definite intracranial metastatic disease. While this would be optimally evaluated with MultiHance, the patient cannot have gadolinium due to renal failure. Vascular: Flow voids are maintained. Skull and upper cervical spine: There are multiple metastatic lesions in the calvarium, better demonstrated on CT due to their small size. There is an aggressive osseous metastasis of C1 on the RIGHT. Sinuses/Orbits: RIGHT maxillary sinus layering fluid suggesting acuity. Otherwise clear. Negative orbits. Other: None. IMPRESSION: MRI brain demonstrating no definite brain metastases, acute stroke, or other significant finding. Widespread white matter lesions, likely chronic microvascular ischemic change. Widespread osseous metastatic disease, most notably affecting C1 on the RIGHT but also the calvarium. No spinal cord compression or intracranial mass lesion Electronically Signed   By: Staci Righter M.D.   On: 09/19/2017 16:47  I reviewed the bone survey and head CT with a radiologist.  He agrees that there are lytic lesions in the skull, upper cervical spine, and pelvis  Medications: I have reviewed the  patient's current medications.  Assessment/Plan: 1.  Metastatic breast cancer  Large right breast mass with diffuse skin thickening  CTs 09/19/2017 with mediastinal/hilar adenopathy, subcarinal adenopathy, widespread bone lesions, right breast mass with skin thickening  MRI of the brain 09/19/2017-negative for brain metastases 2.  Hypercalcemia, status post pamidronate 09/17/2017, calcitonin initiated 09/18/2017 3.  Dehydration 4.  Lytic bone lesions, rib fractures secondary to #1 5.  Hypertension 6.  Renal failure-?  Secondary to ATN 7.  Fever-cultures negative to date 8.  Anemia  Ms. Slay remains very alert.  The hypercalcemia is resolving.  The renal function is improved.  She will undergo a diagnostic biopsy today.  I discussed the need for systemic therapy with Ms. Mulcahey and her sister.  The specific treatment will depend on the biopsy result including the ER/PR and HER-2 result.  She can be discharged from an oncology standpoint when she is ambulatory and taking an adequate diet.  We will arrange for outpatient follow-up at the cancer center.  Recommendations: 1.  Continue intravenous hydration per nephrology 2.  Biopsy of right breast/chest lymph nodes today 3.  Increase ambulation as tolerated 4.  Outpatient follow-up at the Cancer center      LOS: 4 days   Betsy Coder, MD   09/21/2017, 6:55 AM

## 2017-09-21 NOTE — Consult Note (Signed)
            [facility] CM Primary Care Navigator  09/21/2017  Jazelle Achey April 21, 1961 [id number]   Seen patient, daughter Margreta Journey), sister Santiago Glad) and brother Kyung Rudd) at the bedsideto identify possible discharge needs. Patientreports having "weakness, dizziness and fell at daughter's house" that resulted to this admission.  Patientendorses Caesar Chestnut, NP with [employer] [facility] [place] her primary care provider.   [facility] [address] [facility] [address] [facility] to [facility] medications without difficulty.   Patient reportsmanagingherownmedications at home straight out of the containers.  Patient statesthatdaughters will provide transportation to herdoctors' appointments after discharge.  Patient lives on her own. Her daughter will be her primary caregiver at home and her sister, brother (lives nearby) and niece Carolyne Fiscal) will be alternately assisting with her care once discharged. Patient's daughter also mentioned that patient can stay at her house if needed upon discharge.  Discharge plan is still to be determined - pending work-ups per MD note.    Patientand family voiced understanding to call primary care provider's officewhenpatientreturns homefor a post discharge follow-upvisitwithin1- 2 weeksor sooner if needs arise.Patient letter (with PCP's contact number) was provided asareminder.  Discussed with patientand family regarding [facility] CM services available for health managementand resourcesat home, butdenied any needs for now. They verbalized that still unsure of plans for discharge pending her tests and improvement.  Patient and family expressed understanding of needto seekreferral from primary care provider to [facility] care management if deemednecessaryand appropriate for anyservices in thenear future.  [facility] care management information was provided for future needs thatpatient may  have.  Primary care provider's office is listed as providing transition of care (TOC) follow-up.   For additional questions please contact:  Edwena Felty A. Aniel Hubble, BSN, RN-BC [facility] PRIMARY CARE Navigator Cell: [phone] `

## 2017-09-21 NOTE — Progress Notes (Signed)
Patient K+ 2.8 notified doctor received order for 6 runs of potassium. First dose started.  Will monitor.

## 2017-09-21 NOTE — Procedures (Signed)
Interventional Radiology Procedure Note  Procedure: US guided biopsy right breast mass.   Complications: None  Estimated Blood Loss: None  Recommendations: - Path is pending   Signed,  Criselda Peaches, MD

## 2017-09-21 NOTE — Progress Notes (Signed)
Pt came back from IR for Rt. breast biopsy. Band-Aid on Rt. Breast needle biopsy site- dressing is c/d/i. Pt has little soar that site. Pt refused pain medications. HS [employer]

## 2017-09-21 NOTE — Progress Notes (Signed)
  Pharmacy Antibiotic Note  Olivia Patel is a 57 y.o. female admitted on 09/17/2017 with fever. generalized weakness, and slight confusion on vancomycin/zosyn for  sepsis (possible aspiration PNA).  Pharmacy has been consulted for Zosyn dosing.  -WBC= 15, afeb, CrCl ~ 30, cultures- ngtd  Plan: -Continue Zosyn 3.375gm IV q8h -Consider defining length of therapy  Zosyn 3.375g IV q8h EI Height: 5\' 3"  (160 cm) Weight: 197 lb 15.6 oz (89.8 kg) IBW/kg (Calculated) : 52.4  Temp (24hrs), Avg:98.5 F (36.9 C), Min:97.8 F (36.6 C), Max:98.9 F (37.2 C)  Recent Labs  Lab 09/17/17 1118  09/18/17 0542  09/18/17 2027 09/19/17 0228 09/19/17 1508 09/20/17 0400 09/21/17 0405  WBC 16.3*  --  15.8*  --   --  14.5*  --  15.0* 15.0*  CREATININE 1.85*   < > 1.93*   < > 2.32* 2.55* 2.76* 2.61* 2.20*   < > = values in this interval not displayed.    Estimated Creatinine Clearance: 31.1 mL/min (A) (by C-G formula based on SCr of 2.2 mg/dL (H)).    No Known Allergies  Hildred Laser, PharmD Clinical Pharmacist Clinical phone from 8:30-4:00 is [phone] After 4pm, please call Main Rx [phone]) for assistance. 09/21/2017 1:31 PM   e

## 2017-09-21 NOTE — Progress Notes (Signed)
Assessment:  1AKI, likely due to hypovolemia due to hypercalcemia, loop diuretic, decrease po intake +/- NSAID---improvving 2Hypercalcemia of malignancy 3 Prob metastatic breast cancer(pending bx) 4 Hypokalemia-replacing PO and IV  Plan: 1 Cont IVFs with NS to goal 2) K supp prn  3) Renal will sign off   Subjective: Interval History:  Bx for today instead  Objective: Vital signs in last 24 hours: Temp:  [97.6 F (36.4 C)-98.9 F (37.2 C)] 98.9 F (37.2 C) (05/29 0700) Pulse Rate:  [79-100] 96 (05/29 0700) Resp:  [16-18] 18 (05/29 0354) BP: (141-181)/(75-92) 168/81 (05/29 0700) SpO2:  [93 %-96 %] 95 % (05/29 0700) Weight:  [89.8 kg (197 lb 15.6 oz)] 89.8 kg (197 lb 15.6 oz) (05/29 0639) Weight change: 1 kg (2 lb 3.3 oz)  Intake/Output from previous day: 05/28 0701 - 05/29 0700 In: 1710 [P.O.:720; I.V.:840; IV Piggyback:150] Out: 2000 [Urine:2000] Intake/Output this shift: No intake/output data recorded.  General appearance: alert and cooperative Resp: clear to auscultation bilaterally Cardio: regular rate and rhythm, S1, S2 normal, no murmur, click, rub or gallop Extremities: edema 1+  Lab Results: Recent Labs    09/20/17 0400 09/21/17 0405  WBC 15.0* 15.0*  HGB 9.3* 9.0*  HCT 28.8* 27.4*  PLT 298 286   BMET:  Recent Labs    09/20/17 0400 09/21/17 0405  NA 138 138  K 3.2* 2.8*  CL 99* 102  CO2 25 24  GLUCOSE 125* 158*  BUN 49* 41*  CREATININE 2.61* 2.20*  CALCIUM 11.5* 10.6*   No results for input(s): PTH in the last 72 hours. Iron Studies: No results for input(s): IRON, TIBC, TRANSFERRIN, FERRITIN in the last 72 hours. Studies/Results: Ct Abdomen Pelvis Wo Contrast  Result Date: 09/19/2017 CLINICAL DATA:  Hypercalcemia, scattered lytic bone lesions. Concern for breast cancer. EXAM: CT CHEST, ABDOMEN AND PELVIS WITHOUT CONTRAST TECHNIQUE: Multidetector CT imaging of the chest, abdomen and pelvis was performed following the standard protocol  without IV contrast. COMPARISON:  09/17/2017 FINDINGS: CT CHEST FINDINGS Cardiovascular: Unremarkable Mediastinum/Nodes: Mediastinal and quite likely bilateral hilar adenopathy noted with a lower right paratracheal lymph node measuring 2.3 cm in short axis on image 21/4. Right hilar and subcarinal adenopathy observed. In individual right axillary node measures 1.9 cm in short axis on image 24/4. Lungs/Pleura: 0.6 by 0.5 cm left upper lobe pulmonary nodule, image 69/6. There bands of atelectasis in both lower lobes. Small bilateral pleural effusions. Musculoskeletal: Infiltrative masslike appearance in the right breast. One solid component superiorly measures 4.1 by 4.2 cm on image 24/4. There is skin thickening in the right breast. Widespread lytic osseous metastatic disease is present in all visualized bony structures. An index lytic lesion of the right lower sternum measures 1.9 by 1.4 by 2.7 cm. No current pathologic vertebral collapse. There pathologic fractures of the left third, fifth, seventh, eighth, and ninth ribs; and pathologic fractures of the right third, seventh, eighth, and tenth ribs. CT ABDOMEN PELVIS FINDINGS Hepatobiliary: Unremarkable noncontrast CT appearance. Pancreas: Unremarkable Spleen: Unremarkable Adrenals/Urinary Tract: Unremarkable Stomach/Bowel: Unremarkable Vascular/Lymphatic: Unremarkable Reproductive: Calcified uterine fibroids. Other: No supplemental non-categorized findings. Musculoskeletal: Widespread lytic metastatic involvement of visualized abdomen and pelvic skeletal structures as well as the proximal femurs. IMPRESSION: 1. Right breast mass with associated cutaneous thickening, thoracic adenopathy, axillary and subpectoral adenopathy, and widespread lytic osseous metastatic disease involving virtually all bony structures seen on today's exam. 2. Pathologic rib fractures bilaterally as detailed above. 3. 6 mm left upper lobe pulmonary nodule, nonspecific. 4. Small bilateral  pleural effusions, nonspecific for transudative or exudative/malignant etiology. There are bands of atelectasis in both lower lobes. 5. Calcified uterine fibroids. Electronically Signed   By: Van Clines M.D.   On: 09/19/2017 12:04   Ct Soft Tissue Neck Wo Contrast  Result Date: 09/19/2017 CLINICAL DATA:  Breast mass, lytic bone lesions. EXAM: CT NECK WITHOUT CONTRAST TECHNIQUE: Multidetector CT imaging of the neck was performed following the standard protocol without intravenous contrast. COMPARISON:  Radiographs from 09/17/2017 FINDINGS: Pharynx and larynx: Mild circumferential prominence of Waldeyer's ring, likely incidental. Salivary glands: Unremarkable Thyroid: Unremarkable Lymph nodes: Right level II a lymph node upper normal size at 0.9 cm on image 33/4. Right level IV lymph node pathologically enlarged at 1.4 cm in diameter on image 63/4. Right upper subpectoral adenopathy is observed. Vascular: No significant atherosclerotic calcification. Limited intracranial: Unremarkable where included. Visualized orbits: Unremarkable where included. Mastoids and visualized paranasal sinuses: Chronic right maxillary sinusitis. Periapical lucency and cavity associated with tooth # 14. Skeleton: Lytic metastatic disease anteriorly in the left temporal bone near the orbit on image 3/4; in the left occipital condyle; in the right lateral mass of C2; and encompassing much of the C5 vertebral body. There are lytic lesions at all vertebral levels and a notable lytic lesion involving the sternal manubrium. Upper chest: Deferred to CT chest from same day. Other: No supplemental non-categorized findings. IMPRESSION: 1. Widespread lytic osseous metastatic disease with the largest lesions in the neck region along the left occipital condyle, the right lateral mass of C2, and in the C5 vertebral body. All visualized bony structures possibly with the exception of the mandible appear involved. 2. Right level IV adenopathy  just above the supraclavicular region, lymph node 1.4 cm in short axis. There is also right upper subpectoral adenopathy and known mediastinal adenopathy. 3. Periapical lucency and cavity associated with tooth # 14. 4. Chronic right maxillary sinusitis. Electronically Signed   By: Van Clines M.D.   On: 09/19/2017 12:18   Ct Chest Wo Contrast  Result Date: 09/19/2017 CLINICAL DATA:  Hypercalcemia, scattered lytic bone lesions. Concern for breast cancer. EXAM: CT CHEST, ABDOMEN AND PELVIS WITHOUT CONTRAST TECHNIQUE: Multidetector CT imaging of the chest, abdomen and pelvis was performed following the standard protocol without IV contrast. COMPARISON:  09/17/2017 FINDINGS: CT CHEST FINDINGS Cardiovascular: Unremarkable Mediastinum/Nodes: Mediastinal and quite likely bilateral hilar adenopathy noted with a lower right paratracheal lymph node measuring 2.3 cm in short axis on image 21/4. Right hilar and subcarinal adenopathy observed. In individual right axillary node measures 1.9 cm in short axis on image 24/4. Lungs/Pleura: 0.6 by 0.5 cm left upper lobe pulmonary nodule, image 69/6. There bands of atelectasis in both lower lobes. Small bilateral pleural effusions. Musculoskeletal: Infiltrative masslike appearance in the right breast. One solid component superiorly measures 4.1 by 4.2 cm on image 24/4. There is skin thickening in the right breast. Widespread lytic osseous metastatic disease is present in all visualized bony structures. An index lytic lesion of the right lower sternum measures 1.9 by 1.4 by 2.7 cm. No current pathologic vertebral collapse. There pathologic fractures of the left third, fifth, seventh, eighth, and ninth ribs; and pathologic fractures of the right third, seventh, eighth, and tenth ribs. CT ABDOMEN PELVIS FINDINGS Hepatobiliary: Unremarkable noncontrast CT appearance. Pancreas: Unremarkable Spleen: Unremarkable Adrenals/Urinary Tract: Unremarkable Stomach/Bowel: Unremarkable  Vascular/Lymphatic: Unremarkable Reproductive: Calcified uterine fibroids. Other: No supplemental non-categorized findings. Musculoskeletal: Widespread lytic metastatic involvement of visualized abdomen and pelvic skeletal structures as well as the proximal femurs.  IMPRESSION: 1. Right breast mass with associated cutaneous thickening, thoracic adenopathy, axillary and subpectoral adenopathy, and widespread lytic osseous metastatic disease involving virtually all bony structures seen on today's exam. 2. Pathologic rib fractures bilaterally as detailed above. 3. 6 mm left upper lobe pulmonary nodule, nonspecific. 4. Small bilateral pleural effusions, nonspecific for transudative or exudative/malignant etiology. There are bands of atelectasis in both lower lobes. 5. Calcified uterine fibroids. Electronically Signed   By: Van Clines M.D.   On: 09/19/2017 12:04   Mr Brain Wo Contrast  Result Date: 09/19/2017 CLINICAL DATA:  Unexplained alteration of consciousness. Physical exam as well as cross-sectional imaging findings concerning for metastatic breast cancer. EXAM: MRI HEAD WITHOUT CONTRAST TECHNIQUE: Multiplanar, multiecho pulse sequences of the brain and surrounding structures were obtained without intravenous contrast. COMPARISON:  CT neck without contrast earlier today. CT head 09/17/2017. FINDINGS: Brain: No acute stroke, acute hemorrhage, intra-axial mass lesion, hydrocephalus, or extra-axial fluid. Mild atrophy. Extensive T2 and FLAIR hyperintensities throughout the white matter, more focal and confluent, favored to represent chronic microvascular ischemic change. No definite intracranial metastatic disease. While this would be optimally evaluated with MultiHance, the patient cannot have gadolinium due to renal failure. Vascular: Flow voids are maintained. Skull and upper cervical spine: There are multiple metastatic lesions in the calvarium, better demonstrated on CT due to their small size. There  is an aggressive osseous metastasis of C1 on the RIGHT. Sinuses/Orbits: RIGHT maxillary sinus layering fluid suggesting acuity. Otherwise clear. Negative orbits. Other: None. IMPRESSION: MRI brain demonstrating no definite brain metastases, acute stroke, or other significant finding. Widespread white matter lesions, likely chronic microvascular ischemic change. Widespread osseous metastatic disease, most notably affecting C1 on the RIGHT but also the calvarium. No spinal cord compression or intracranial mass lesion Electronically Signed   By: Staci Righter M.D.   On: 09/19/2017 16:47    Scheduled: . amLODipine  5 mg Oral Daily  . heparin  5,000 Units Subcutaneous Q8H  . labetalol  100 mg Oral BID  . potassium chloride  40 mEq Oral BID      LOS: 4 days   Estanislado Emms 09/21/2017,8:19 AM

## 2017-09-22 LAB — BASIC METABOLIC PANEL
Anion gap: 11 (ref 5–15)
BUN: 30 mg/dL — AB (ref 6–20)
CHLORIDE: 107 mmol/L (ref 101–111)
CO2: 23 mmol/L (ref 22–32)
Calcium: 10.9 mg/dL — ABNORMAL HIGH (ref 8.9–10.3)
Creatinine, Ser: 1.97 mg/dL — ABNORMAL HIGH (ref 0.44–1.00)
GFR calc Af Amer: 32 mL/min — ABNORMAL LOW (ref >60)
GFR calc non Af Amer: 28 mL/min — ABNORMAL LOW (ref >60)
GLUCOSE: 159 mg/dL — AB (ref 65–99)
POTASSIUM: 3.6 mmol/L (ref 3.5–5.1)
Sodium: 141 mmol/L (ref 135–145)

## 2017-09-22 LAB — CBC
HEMATOCRIT: 28 % — AB (ref 36.0–46.0)
Hemoglobin: 9 g/dL — ABNORMAL LOW (ref 12.0–15.0)
MCH: 27.4 pg (ref 26.0–34.0)
MCHC: 32.1 g/dL (ref 30.0–36.0)
MCV: 85.1 fL (ref 78.0–100.0)
Platelets: 274 10*3/uL (ref 150–400)
RBC: 3.29 MIL/uL — ABNORMAL LOW (ref 3.87–5.11)
RDW: 13.9 % (ref 11.5–15.5)
WBC: 16.2 10*3/uL — ABNORMAL HIGH (ref 4.0–10.5)

## 2017-09-22 LAB — MULTIPLE MYELOMA PANEL, SERUM
ALBUMIN/GLOB SERPL: 0.7 (ref 0.7–1.7)
ALPHA 1: 0.5 g/dL — AB (ref 0.0–0.4)
Albumin SerPl Elph-Mcnc: 2.8 g/dL — ABNORMAL LOW (ref 2.9–4.4)
Alpha2 Glob SerPl Elph-Mcnc: 1 g/dL (ref 0.4–1.0)
B-Globulin SerPl Elph-Mcnc: 1.4 g/dL — ABNORMAL HIGH (ref 0.7–1.3)
GAMMA GLOB SERPL ELPH-MCNC: 1.3 g/dL (ref 0.4–1.8)
GLOBULIN, TOTAL: 4.2 g/dL — AB (ref 2.2–3.9)
IGA: 386 mg/dL — AB (ref 87–352)
IgG (Immunoglobin G), Serum: 1445 mg/dL (ref 700–1600)
IgM (Immunoglobulin M), Srm: 177 mg/dL (ref 26–217)
Total Protein ELP: 7 g/dL (ref 6.0–8.5)

## 2017-09-22 MED ORDER — ENSURE ENLIVE PO LIQD
237.0000 mL | Freq: Two times a day (BID) | ORAL | Status: DC
  Administered 2017-09-23 – 2017-09-25 (×5): 237 mL via ORAL

## 2017-09-22 MED ORDER — ADULT MULTIVITAMIN W/MINERALS CH
1.0000 | ORAL_TABLET | Freq: Every day | ORAL | Status: DC
  Administered 2017-09-22 – 2017-09-26 (×5): 1 via ORAL
  Filled 2017-09-22 (×5): qty 1

## 2017-09-22 MED ORDER — HYDRALAZINE HCL 50 MG PO TABS
25.0000 mg | ORAL_TABLET | Freq: Three times a day (TID) | ORAL | Status: DC
  Administered 2017-09-22 – 2017-09-26 (×12): 25 mg via ORAL
  Filled 2017-09-22 (×12): qty 1

## 2017-09-22 MED ORDER — AMOXICILLIN-POT CLAVULANATE 875-125 MG PO TABS
1.0000 | ORAL_TABLET | Freq: Two times a day (BID) | ORAL | Status: DC
  Administered 2017-09-22 – 2017-09-26 (×9): 1 via ORAL
  Filled 2017-09-22 (×9): qty 1

## 2017-09-22 MED ORDER — ENSURE ENLIVE PO LIQD
237.0000 mL | Freq: Two times a day (BID) | ORAL | Status: DC
  Administered 2017-09-22: 237 mL via ORAL

## 2017-09-22 MED ORDER — POTASSIUM CHLORIDE CRYS ER 20 MEQ PO TBCR
40.0000 meq | EXTENDED_RELEASE_TABLET | Freq: Once | ORAL | Status: AC
  Administered 2017-09-22: 40 meq via ORAL
  Filled 2017-09-22: qty 2

## 2017-09-22 NOTE — Progress Notes (Addendum)
Nutrition Follow-up  DOCUMENTATION CODES:   Obesity unspecified  INTERVENTION:   -Liberalize diet to 2 grams sodium, per MD -Ensure Enlive po BID, each supplement provides 350 kcal and 20 grams of protein -Continue MVI with minerals daily -D/c 30 ml Prostat BID  NUTRITION DIAGNOSIS:   Inadequate oral intake related to lethargy/confusion as evidenced by meal completion < 25%.  Progressing  GOAL:   Patient will meet greater than or equal to 90% of their needs  Progressing  MONITOR:   PO intake, Supplement acceptance, Labs, Weight trends, Skin, I & O's  REASON FOR ASSESSMENT:   Malnutrition Screening Tool    ASSESSMENT:   Very pleasant 57 year old female with a wonderful family who presents with complaints of fatigue and recent onset of back pain.  Evaluation in the emergency department reveals profound hypercalcemia and renal insufficiency  5/29- s/p biopsy of rt breast mass  Per MD notes, MRI negative for brain metastasis.   Case discussed with RN, who reports pt is more alert today and intake is improving, but still not sufficient. She reports family has requesting regarding renal diet.   Spoke with pt and pt sister at bedside. Pt reports a poor appetite over the past 3 weeks. Pt shares that she usually "eat light" and often avoids meat; typical intake usually consists of breakfast: eggs, grits, and toast; lunch: tuna, crackers, and applesauce; Dinner: sandwich. However, over the past 3 weeks, pt has consuming only 1-2 meals per day, consisting either of grits or fruit. Meal completion remain variable; po 0-100% (averaging around 25-50% intake of meals). Of note, pt consumed 100% of a fruit smoothie brought in by pt son.   Pt endorses wt loss, but unsure of amount. Noted pt has experienced a 8.6% wt loss over the past 3 months, which is significant for time frame.   Discussed importance of good meal and supplement intake to promote healing. Pt is amenable to Ensure  supplements. Also discussed with family potential of liberalizing diet due to improved renal function (renal signed off on 09/21/17). Text paged Dr. [place] Pink at [phone]; diet was liberalized to 2 gram sodium by MD.   Labs reviewed.   NUTRITION - FOCUSED PHYSICAL EXAM:    Most Recent Value  Orbital Region  No depletion  Upper Arm Region  No depletion  Thoracic and Lumbar Region  No depletion  Buccal Region  No depletion  Temple Region  No depletion  Clavicle Bone Region  No depletion  Clavicle and Acromion Bone Region  No depletion  Scapular Bone Region  No depletion  Dorsal Hand  No depletion  Patellar Region  No depletion  Anterior Thigh Region  No depletion  Posterior Calf Region  No depletion  Edema (RD Assessment)  Mild  Hair  Reviewed  Eyes  Reviewed  Mouth  Reviewed  Skin  Reviewed  Nails  Reviewed       Diet Order:   Diet Order           Diet 2 gram sodium Room service appropriate? Yes; Fluid consistency: Thin  Diet effective now          EDUCATION NEEDS:   Not appropriate for education at this time  Skin:  Skin Assessment: Reviewed RN Assessment  Last BM:  09/22/17  Height:   Ht Readings from Last 1 Encounters:  09/17/17 5\' 3"  (1.6 m)    Weight:   Wt Readings from Last 1 Encounters:  09/21/17 197 lb 15.6 oz (89.8 kg)  Ideal Body Weight:  52.3 kg  BMI:  Body mass index is 35.07 kg/m.  Estimated Nutritional Needs:   Kcal:  1650-1850  Protein:  85-100 grams  Fluid:  1.6-1.8 L    Myndi Wamble A. Jimmye Norman, RD, LDN, CDE Pager: [phone] After hours Pager: [phone]

## 2017-09-22 NOTE — Care Management Important Message (Signed)
Important Message  Patient Details  Name: Olivia Patel MRN: [id number] Date of Birth: 1961-04-06   Medicare Important Message Given:  Yes    Vong Garringer Montine Circle 09/22/2017, 3:05 PM

## 2017-09-22 NOTE — Progress Notes (Signed)
IP PROGRESS NOTE  Subjective:   She is alert.  No complaint.  She underwent biopsy of the right breast mass yesterday.  She reports tolerating the procedure well.  Objective: Vital signs in last 24 hours: Blood pressure (!) 154/80, pulse (!) 111, temperature 98.8 F (37.1 C), temperature source Oral, resp. rate 18, height 5\' 3"  (1.6 m), weight 197 lb 15.6 oz (89.8 kg), last menstrual period 05/29/2015, SpO2 90 %.  Intake/Output from previous day: 05/29 0701 - 05/30 0700 In: 2378.3 [P.O.:200; I.V.:1778.3; IV Piggyback:400] Out: 2900 [Urine:2900]  Physical Exam: HEENT: No thrush Cardiac: Regular rate and rhythm, tachycardia Lungs: Decreased breath sounds at the lower chest, no respiratory distress Vascular: No leg edema   Lab Results: Recent Labs    09/21/17 0405 09/22/17 0304  WBC 15.0* 16.2*  HGB 9.0* 9.0*  HCT 27.4* 28.0*  PLT 286 274    BMET Recent Labs    09/21/17 0405 09/22/17 0304  NA 138 141  K 2.8* 3.6  CL 102 107  CO2 24 23  GLUCOSE 158* 159*  BUN 41* 30*  CREATININE 2.20* 1.97*  CALCIUM 10.6* 10.9*     Medications: I have reviewed the patient's current medications.  Assessment/Plan: 1.  Metastatic breast cancer  Large right breast mass with diffuse skin thickening  CTs 09/19/2017 with mediastinal/hilar adenopathy, subcarinal adenopathy, widespread bone lesions, right breast mass with skin thickening  MRI of the brain 09/19/2017-negative for brain metastases  Ultrasound-guided biopsy of the right breast mass on 09/21/2017 2.  Hypercalcemia, status post pamidronate 09/17/2017, calcitonin initiated 09/18/2017 3.  Dehydration secondary to hypercalcemia, resolved 4.  Lytic bone lesions, rib fractures secondary to #1 5.  Hypertension 6.  Renal failure-?  Secondary to ATN, improving 7.  Fever-cultures negative to date 8.  Anemia secondary to metastatic breast cancer and phlebotomy  Ms. [place] stable.  The calcium level is slightly higher today.   The renal function continues to improve.  I discussed the case with pathology.  Preliminary review of the breast biopsy is consistent with breast cancer.  ER/PR staining will be performed today.  I plan to recommend an aromatase inhibitor and a CDK inhibitor if the tumor returns hormone receptor positive.   She should be stable for discharge on 09/23/2017 if she is ambulatory and taking an adequate diet.  I recommend checking the calcium level again on 09/23/2017.  We can consider a dose of Zometa if the calcium level is again higher.  The hypercalcemia should respond to systemic treatment of the breast cancer.  Recommendations: 1.  Follow-up on the final pathology including ER/PR from the right breast biopsy 2.  Plan to begin an aromatase inhibitor and a CDK inhibitor if the tumor returns hormone receptor positive 3.  Outpatient follow-up will be scheduled at the Cancer center for the week of 09/26/2017.      LOS: 5 days   Betsy Coder, MD   09/22/2017, 7:02 AM

## 2017-09-22 NOTE — Progress Notes (Signed)
[facility] PROGRESS NOTE  Natika Geyer WUJ:[id number] DOB: 08-Oct-1960 DOA: 09/17/2017  PCP: Lance Sell, NP  Brief History/Interval Summary: 57 year old female with hypertension, anemia, arthritis presented with progressive 1 week history of generalized weakness, fall on the morning of admission.  Patient's daughter also reported slight confusion following the fall.  Patient had decreased concentration, no stupor, polydipsia, polyuria. Labs showed potassium 2.8, chloride 84, bicarb 37, creatinine 1.85, calcium greater than 15, magnesium 1.4.  Patient was hospitalized for further management.  Reason for Visit: Severe hypercalcemia  Consultants: Oncology: Dr. Benay Spice.  Interventional radiology  Procedures: Biopsy of the breast mass 5/29  Antibiotics: IV vancomycin from 5/26 to 5/28 On IV Zosyn  Subjective/Interval History: Patient feels well.  Denies any pain at the biopsy site.  States that her swelling in the arms and legs is improving.  Denies any shortness of breath.    ROS: Denies any headaches.  Objective:  Vital Signs  Vitals:   09/22/17 0002 09/22/17 0446 09/22/17 0753 09/22/17 0806  BP: (!) 166/86 (!) 154/80  (!) 174/89  Pulse: 93 (!) 111 (!) 130 (!) 113  Resp: 18 18    Temp: 98.9 F (37.2 C) 98.8 F (37.1 C)  99 F (37.2 C)  TempSrc: Oral Oral  Oral  SpO2: 96% 90% 93% 90%  Weight:      Height:        Intake/Output Summary (Last 24 hours) at 09/22/2017 1213 Last data filed at 09/22/2017 0448 Gross per 24 hour  Intake 650 ml  Output 2900 ml  Net -2250 ml   Filed Weights   09/17/17 2046 09/20/17 0316 09/21/17 0639  Weight: 86.1 kg (189 lb 13.1 oz) 88.8 kg (195 lb 12.3 oz) 89.8 kg (197 lb 15.6 oz)    General appearance: Awake alert.  In no distress. Resp: Clear to auscultation bilaterally.  No wheezing rales or rhonchi. Cardio: S1-S2 is normal regular.  No S3-S4.  No rubs murmurs or bruit GI: Abdomen is soft.  Nontender nondistended.   Bowel sounds are present.  No masses organomegaly Extremities: Edema present bilateral lower and upper extremities.  Seems to be slightly better today. Pulses: 2+ and symmetric Neurologic: No focal neurological deficits.  Lab Results:  Data Reviewed: I have personally reviewed following labs and imaging studies  CBC: Recent Labs  Lab 09/17/17 1118 09/18/17 0542 09/19/17 0228 09/20/17 0400 09/21/17 0405 09/22/17 0304  WBC 16.3* 15.8* 14.5* 15.0* 15.0* 16.2*  NEUTROABS 13.3*  --   --   --   --   --   HGB 12.0 11.0* 10.1* 9.3* 9.0* 9.0*  HCT 36.5 34.1* 31.1* 28.8* 27.4* 28.0*  MCV 84.5 85.5 85.2 85.7 84.8 85.1  PLT 344 340 318 298 286 956    Basic Metabolic Panel: Recent Labs  Lab 09/17/17 1118  09/19/17 0228 09/19/17 1508 09/20/17 0400 09/21/17 0405 09/22/17 0304  NA 132*   < > 136 135 138 138 141  K 2.8*   < > 3.2* 3.2* 3.2* 2.8* 3.6  CL 84*   < > 95* 97* 99* 102 107  CO2 37*   < > 30 27 25 24 23   GLUCOSE 119*   < > 164* 134* 125* 158* 159*  BUN 42*   < > 47* 52* 49* 41* 30*  CREATININE 1.85*   < > 2.55* 2.76* 2.61* 2.20* 1.97*  CALCIUM >15.0*   < > 13.9* 12.7* 11.5* 10.6* 10.9*  MG 1.4*  --   --   --   --  1.3*  --    < > = values in this interval not displayed.    GFR: Estimated Creatinine Clearance: 34.7 mL/min (A) (by C-G formula based on SCr of 1.97 mg/dL (H)).  Liver Function Tests: Recent Labs  Lab 09/18/17 0542 09/18/17 2027 09/19/17 0228 09/20/17 0400 09/21/17 0405  AST 32 34 32 30 33  ALT 23 25 25 25 25   ALKPHOS 113 106 101 90 95  BILITOT 1.0 1.3* 1.3* 1.2 1.1  PROT 6.8 6.5 6.5 6.2* 5.9*  ALBUMIN 2.7* 2.6* 2.5* 2.3* 2.3*    Coagulation Profile: Recent Labs  Lab 09/21/17 0405  INR 1.39    Cardiac Enzymes: Recent Labs  Lab 09/17/17 1118  CKTOTAL 73    CBG: Recent Labs  Lab 09/17/17 1642 09/17/17 1958 09/18/17 0747 09/18/17 1213  GLUCAP 96 137* 105* 124*     Recent Results (from the past 240 hour(s))  Culture, blood  (Routine X 2) w Reflex to ID Panel     Status: None (Preliminary result)   Collection Time: 09/18/17  1:31 PM  Result Value Ref Range Status   Specimen Description BLOOD RIGHT HAND  Final   Special Requests   Final    BOTTLES DRAWN AEROBIC AND ANAEROBIC Blood Culture adequate volume   Culture   Final    NO GROWTH 3 DAYS Performed at [facility], [place] [address], [place], [place] [address]    Report Status PENDING  Incomplete  Culture, blood (Routine X 2) w Reflex to ID Panel     Status: None (Preliminary result)   Collection Time: 09/18/17  1:31 PM  Result Value Ref Range Status   Specimen Description BLOOD RIGHT WRIST  Final   Special Requests   Final    BOTTLES DRAWN AEROBIC AND ANAEROBIC Blood Culture adequate volume   Culture   Final    NO GROWTH 3 DAYS Performed at [facility], [address], [place], [place] [address]    Report Status PENDING  Incomplete  MRSA PCR Screening     Status: None   Collection Time: 09/20/17  1:58 PM  Result Value Ref Range Status   MRSA by PCR NEGATIVE NEGATIVE Final    Comment:        The GeneXpert MRSA Assay (FDA approved for NASAL specimens only), is one component of a comprehensive MRSA colonization surveillance program. It is not intended to diagnose MRSA infection nor to guide or monitor treatment for MRSA infections. Performed at [facility], [place] [address] [place], [place] [address]       Radiology Studies: No results found.   Medications:  Scheduled: . amLODipine  5 mg Oral Daily  . heparin  5,000 Units Subcutaneous Q8H  . labetalol  100 mg Oral BID   Continuous: . sodium chloride 100 mL/hr at 09/21/17 1139  . piperacillin-tazobactam (ZOSYN)  IV 3.375 g (09/22/17 0627)  . sodium chloride     OVZ:CHYIFOYDXAJOI **OR** acetaminophen, bisacodyl, hydrALAZINE, HYDROcodone-acetaminophen, ondansetron **OR** ondansetron (ZOFRAN) IV, senna-docusate  Assessment/Plan:      Severe hypercalcemia Likely  secondary to malignancy. Patient is found to have a mass in the right breast and is found to have multiple adenopathy on imaging studies. This is concerning for malignant breastcancer. Patient was given IV fluids and pamidronate. She also received Lasix. She was given 4 doses of calcitonin. Calcium level has improved. Noted to be slightly higher today tomorrow morning. Discussed with Dr. Benay Spice. May have to give Zometa  if calciumlevel is higher tomorrow.  Right breast mass with adenopathy Concern for metastatic breast cancer. Oncology is following. Patient underwent biopsy on 5/29. To be followed by oncology in the office. MRI brain negative for brain metastases. ? Hypokalemia Potassium level has improved.  Acute kidney injury Thought to be secondary to severe dehydration and hypercalcemia and ATN. Nephrology was consulted. Patient started on IV fluids. Patient renal function is improving. She has good urine output. Further cut back on IV fluids to edema.   Acute respiratory failure with hypoxia and sepsis On 5/26 patient had tachypnea altered mental status and fevers. Procalcitonin 0.5. Chest x-ray showed bilateral pleural effusion and bibasilar hazy atelectasis versus airspace disease. Patient was started on IV vancomycin and Zosyn. Blood culturesremain negative. Vancomycin was discontinued. She could have aspirated as she was encephalopathic. Seen by speech therapy. No aspiration suspected. Change to Augmentin.  Normocytic anemia Downward trending hemoglobin noted. No evidence for overt bleeding. Hemoglobin stable over the last 3 days.  Edema involving upper and lower extremities Likely multifactorial including hypoalbuminemia, IV fluids. Prune nutrition. Cut back further on IV fluids.  Acute metabolic encephalopathy Likely secondary to hypercalcemia. MRI brain negative for any metastatic process. Mental status appears to be close to baseline.  Essential hypertension Blood pressure  remains suboptimally controlled. She is noted to be on amlodipine and labetalol. Diuretic has been discontinued. Could add hydralazine.    DVT Prophylaxis: Subcutaneous heparin    Code Status: Full code Family Communication: Discussed with the patient and her sister Disposition Plan: Management as outlined above.  Repeat labs tomorrow.  Anticipate discharge in 1 to 2 days.  Will likely need home health at discharge.    LOS: 5 days   [facility] Pager [phone] 09/22/2017, 12:13 PM  If 7PM-7AM, please contact night-coverage at [email or web address], password [facility]

## 2017-09-22 NOTE — Evaluation (Signed)
Clinical/Bedside Swallow Evaluation Patient Details  Name: Olivia Patel MRN: [id number] Date of Birth: 1960-09-23  Today's Date: 09/22/2017 Time: SLP Start Time (ACUTE ONLY): 0086 SLP Stop Time (ACUTE ONLY): 1006 SLP Time Calculation (min) (ACUTE ONLY): 8 min  Past Medical History:  Past Medical History:  Diagnosis Date  . Anemia   . Arthritis   . Hypertension    Past Surgical History:  Past Surgical History:  Procedure Laterality Date  . TUBAL LIGATION     HPI:  Olivia Patel is a 57 y.o. female with medical history significant for HTN, anemia, arthritis, brought to the emergency department, with complaints of progressive, one-week history of generalized weakness and fall that occurred this morning.  Underwent US guided biopsy right breast mass and biopsy for suspected breast cancer. On 5/26 pt had tachypnea AMS and fevers with CXR showed bilateral pleural effusion and bibasilar hazy atelectasis versus airspace disease.     Assessment / Plan / Recommendation Clinical Impression  Pt denies difficulty swallowing and globus pharyngeal/esophageal deficits. Sister has not seen pt cough with liquids but was concerned that her voice sounded "muffled" and wondered if it affected her swallow. She consumed 3 oz water without hesitation or s/s aspiration, functional mastication and transit. Do not suspect aspiration is this pt at present. Continue regular texture/thin liquids and no further ST. SLP Visit Diagnosis: Dysphagia, unspecified (R13.10)    Aspiration Risk  Mild aspiration risk    Diet Recommendation Regular;Thin liquid   Liquid Administration via: Cup;No straw Medication Administration: Whole meds with liquid Supervision: Patient able to self feed Postural Changes: Seated upright at 90 degrees    Other  Recommendations Oral Care Recommendations: Oral care BID   Follow up Recommendations None      Frequency and Duration            Prognosis        Swallow  Study   General HPI: Olivia Patel is a 57 y.o. female with medical history significant for HTN, anemia, arthritis, brought to the emergency department, with complaints of progressive, one-week history of generalized weakness and fall that occurred this morning.  Underwent US guided biopsy right breast mass and biopsy for suspected breast cancer. On 5/26 pt had tachypnea AMS and fevers with CXR showed bilateral pleural effusion and bibasilar hazy atelectasis versus airspace disease.   Type of Study: Bedside Swallow Evaluation Previous Swallow Assessment: no prior ST intervention Diet Prior to this Study: Regular;Thin liquids Temperature Spikes Noted: Yes Respiratory Status: Room air History of Recent Intubation: No Behavior/Cognition: Alert;Cooperative;Pleasant mood Oral Cavity Assessment: Within Functional Limits Oral Care Completed by SLP: No Oral Cavity - Dentition: Adequate natural dentition Vision: Functional for self-feeding Self-Feeding Abilities: Able to feed self Patient Positioning: Upright in chair Baseline Vocal Quality: Normal Volitional Cough: Weak Volitional Swallow: Able to elicit    Oral/Motor/Sensory Function Overall Oral Motor/Sensory Function: Within functional limits   Ice Chips Ice chips: Not tested   Thin Liquid Thin Liquid: Within functional limits Presentation: Cup;Straw    Nectar Thick Nectar Thick Liquid: Not tested   Honey Thick Honey Thick Liquid: Not tested   Puree Puree: Not tested   Solid   GO   Solid: Within functional limits        Houston Siren 09/22/2017,10:18 AM   Orbie Pyo Colvin Caroli.Ed [employer] [phone]

## 2017-09-22 NOTE — Evaluation (Signed)
Physical Therapy Evaluation Patient Details Name: Olivia Patel MRN: [id number] DOB: 12/09/1960 Today's Date: 09/22/2017   History of Present Illness  57 year old female admitted with  1 week history of generalized weakness, fall on the morning of admission with hypercalcemia, pt found to have breast mass and acute encephalopathy. PMHx: HTN, anemia, arthritis  Clinical Impression  Pt pleasant and willing to participate with PT. Sister in room throughout session and very supportive. Pt demonstrates functional gait deficits compared to baseline in areas of bed mobility, transfers, gait, and endurance. Pt would benefit from acute PT to address deficits and improve level of independence. PT has adequate family support for home D/C recommendation.    Follow Up Recommendations Home health PT;Supervision for mobility/OOB    Equipment Recommendations  Rolling walker with 5" wheels;3in1 (PT)(rollator)    Recommendations for Other Services       Precautions / Restrictions Precautions Precautions: Fall Restrictions Weight Bearing Restrictions: No      Mobility  Bed Mobility Overal bed mobility: Needs Assistance Bed Mobility: Rolling;Sidelying to Sit Rolling: Min guard Sidelying to sit: Min guard       General bed mobility comments: cues for sequence with increased time. Rolling for pericare with use of rail; Min guard for safety and pt used L upper bed rail  Transfers Overall transfer level: Needs assistance Equipment used: Rolling walker (2 wheeled) Transfers: Sit to/from Stand Sit to Stand: Min guard         General transfer comment: cues for sequence; min guard for safety and increased time taken  Ambulation/Gait Ambulation/Gait assistance: Min guard Ambulation Distance (Feet): 60 Feet Assistive device: Rolling walker (2 wheeled) Gait Pattern/deviations: Step-through pattern;Decreased stride length;Trunk flexed Gait velocity: Decreased Gait velocity interpretation:  <1.8 ft/sec, indicate of risk for recurrent falls General Gait Details: Pt ambulated 60 ft minguard with RW slowly with cues to step into RW. PT reports that gait speed is significantly decreased compared to baseline.  Stairs            Wheelchair Mobility    Modified Rankin (Stroke Patients Only)       Balance Overall balance assessment: Needs assistance   Sitting balance-Leahy Scale: Good     Standing balance support: Bilateral upper extremity supported;During functional activity;No upper extremity supported Standing balance-Leahy Scale: Fair                               Pertinent Vitals/Pain Pain Assessment: No/denies pain    Home Living Family/patient expects to be discharged to:: Private residence Living Arrangements: Children Available Help at Discharge: Family;Available 24 hours/day Type of Home: Apartment Home Access: Level entry     Home Layout: One level Home Equipment: (Rollator; 3 in 1 BSC)      Prior Function Level of Independence: Independent         Comments: Pt driving previously and enjoys shopping; 1 fall when pt was confused and thought she was in bathroom but in son's room     Hand Dominance        Extremity/Trunk Assessment   Upper Extremity Assessment Upper Extremity Assessment: Generalized weakness    Lower Extremity Assessment Lower Extremity Assessment: Generalized weakness       Communication   Communication: Other (comment)(pt speaks softly and can be difficult to understand)  Cognition Arousal/Alertness: Awake/alert Behavior During Therapy: WFL for tasks assessed/performed Overall Cognitive Status: Within Functional Limits for tasks assessed  General Comments: Pt reported initial confusion secondary to hypercalcemia but states that her cognition is back to normal and was A&O x 4      General Comments      Exercises     Assessment/Plan    PT  Assessment Patient needs continued PT services  PT Problem List Decreased strength;Decreased mobility;Decreased activity tolerance;Decreased knowledge of use of DME       PT Treatment Interventions Gait training;Therapeutic activities;Stair training;Therapeutic exercise;DME instruction;Functional mobility training;Patient/family education    PT Goals (Current goals can be found in the Care Plan section)  Acute Rehab PT Goals Patient Stated Goal: return to shopping PT Goal Formulation: With patient/family Time For Goal Achievement: 10/06/17 Potential to Achieve Goals: Good    Frequency Min 3X/week   Barriers to discharge        Co-evaluation               AM-PAC PT "6 Clicks" Daily Activity  Outcome Measure Difficulty turning over in bed (including adjusting bedclothes, sheets and blankets)?: None Difficulty moving from lying on back to sitting on the side of the bed? : A Lot Difficulty sitting down on and standing up from a chair with arms (e.g., wheelchair, bedside commode, etc,.)?: A Little Help needed moving to and from a bed to chair (including a wheelchair)?: A Little Help needed walking in hospital room?: A Little Help needed climbing 3-5 steps with a railing? : A Little 6 Click Score: 18    End of Session Equipment Utilized During Treatment: Gait belt Activity Tolerance: Patient tolerated treatment well Patient left: in chair;with call bell/phone within reach;with chair alarm set;with family/visitor present Nurse Communication: Mobility status PT Visit Diagnosis: Other abnormalities of gait and mobility (R26.89);Muscle weakness (generalized) (M62.81)    Time: 0732-0802 PT Time Calculation (min) (ACUTE ONLY): 30 min   Charges:   PT Evaluation $PT Eval Moderate Complexity: 1 Mod PT Treatments $Gait Training: 8-22 mins   PT G Codes:        Gabe Ramsey Midgett, SPT  Olivia Patel 09/22/2017, 10:00 AM

## 2017-09-22 NOTE — Evaluation (Signed)
Occupational Therapy Evaluation Patient Details Name: Olivia Patel MRN: [id number] DOB: Jul 24, 1960 Today's Date: 09/22/2017    History of Present Illness 57 year old female admitted with  1 week history of generalized weakness, fall on the morning of admission with hypercalcemia, pt found to have breast mass and acute encephalopathy. PMHx: HTN, anemia, arthritis   Clinical Impression   Pt reports she was independent with ADL PTA. Currently pt min guard for functional mobility and min assist for LB ADL. DOE 2/4; educated on pursed lip breathing and began energy conservation education. Pt planning to d/c home with 24/7 supervision from family. Recommending HHOT for follow up to maximize independence and safety with ADL and functional mobility upon return home. Pt may progress to home without OT follow up. Pt would benefit from continued skilled OT to address established goals.    Follow Up Recommendations  Home health OT    Equipment Recommendations  3 in 1 bedside commode    Recommendations for Other Services       Precautions / Restrictions Precautions Precautions: Fall Restrictions Weight Bearing Restrictions: No      Mobility Bed Mobility               General bed mobility comments: Pt OOB in chair upon arrival  Transfers Overall transfer level: Needs assistance Equipment used: Rolling walker (2 wheeled) Transfers: Sit to/from Stand Sit to Stand: Min guard         General transfer comment: cues for hand placement, increased time and effort, min guard for safety    Balance Overall balance assessment: Needs assistance Sitting-balance support: Feet supported Sitting balance-Leahy Scale: Good     Standing balance support: Bilateral upper extremity supported Standing balance-Leahy Scale: Poor Standing balance comment: RW for support throughout                           ADL either performed or assessed with clinical judgement   ADL Overall  ADL's : Needs assistance/impaired Eating/Feeding: Set up;Sitting   Grooming: Set up;Sitting   Upper Body Bathing: Set up;Supervision/ safety;Sitting   Lower Body Bathing: Minimal assistance;Sit to/from stand   Upper Body Dressing : Set up;Supervision/safety;Sitting   Lower Body Dressing: Minimal assistance;Sit to/from stand   Toilet Transfer: Min guard;Ambulation;RW           Functional mobility during ADLs: Min guard;Rolling walker General ADL Comments: DOE 2/4; educated on pursed lip breathing strategies and energy conservation     Vision         Perception     Praxis      Pertinent Vitals/Pain Pain Assessment: No/denies pain Faces Pain Scale: Hurts a little bit Pain Intervention(s): Monitored during session     Hand Dominance     Extremity/Trunk Assessment Upper Extremity Assessment Upper Extremity Assessment: Generalized weakness   Lower Extremity Assessment Lower Extremity Assessment: Defer to PT evaluation       Communication Communication Communication: No difficulties   Cognition Arousal/Alertness: Awake/alert Behavior During Therapy: WFL for tasks assessed/performed Overall Cognitive Status: Within Functional Limits for tasks assessed                                    General Comments       Exercises     Shoulder Instructions      Home Living Family/patient expects to be discharged to:: Private residence Living Arrangements: Children Available Help  at Discharge: Family;Available 24 hours/day Type of Home: Apartment Home Access: Level entry     Home Layout: One level     Bathroom Shower/Tub: Teacher, early years/pre: Standard     Home Equipment: None          Prior Functioning/Environment Level of Independence: Independent                 OT Problem List: Decreased strength;Decreased activity tolerance;Impaired balance (sitting and/or standing);Decreased knowledge of use of DME or AE       OT Treatment/Interventions: Self-care/ADL training;Therapeutic exercise;Energy conservation;DME and/or AE instruction;Therapeutic activities;Patient/family education;Balance training    OT Goals(Current goals can be found in the care plan section) Acute Rehab OT Goals Patient Stated Goal: return to independence OT Goal Formulation: With patient/family Time For Goal Achievement: 10/06/17 Potential to Achieve Goals: Good ADL Goals Pt Will Perform Lower Body Bathing: with modified independence;sit to/from stand Pt Will Perform Lower Body Dressing: with modified independence;sit to/from stand Pt Will Transfer to Toilet: with modified independence;ambulating;bedside commode Pt Will Perform Toileting - Clothing Manipulation and hygiene: with modified independence;sit to/from stand Pt Will Perform Tub/Shower Transfer: Tub transfer;with modified independence;ambulating;3 in 1;rolling walker Additional ADL Goal #1: Pt will independently verbally recall 3 energy conservation strategies and utilize during ADL.  OT Frequency: Min 2X/week   Barriers to D/C:            Co-evaluation              AM-PAC PT "6 Clicks" Daily Activity     Outcome Measure Help from another person eating meals?: None Help from another person taking care of personal grooming?: A Little Help from another person toileting, which includes using toliet, bedpan, or urinal?: A Little Help from another person bathing (including washing, rinsing, drying)?: A Little Help from another person to put on and taking off regular upper body clothing?: None Help from another person to put on and taking off regular lower body clothing?: A Little 6 Click Score: 20   End of Session Equipment Utilized During Treatment: Rolling walker  Activity Tolerance: Patient tolerated treatment well Patient left: in chair;with call bell/phone within reach;with family/visitor present  OT Visit Diagnosis: Unsteadiness on feet (R26.81);Muscle  weakness (generalized) (M62.81)                Time: 2947-6546 OT Time Calculation (min): 13 min Charges:  OT General Charges $OT Visit: 1 Visit OT Evaluation $OT Eval Moderate Complexity: 1 Mod G-Codes:     Hogan Hoobler A. Ulice Brilliant, M.S., OTR/L Acute Rehab Department: [phone]  Binnie Kand 09/22/2017, 1:14 PM

## 2017-09-23 ENCOUNTER — Inpatient Hospital Stay (HOSPITAL_COMMUNITY): Payer: Medicare Other

## 2017-09-23 ENCOUNTER — Telehealth: Payer: Self-pay | Admitting: Oncology

## 2017-09-23 DIAGNOSIS — I10 Essential (primary) hypertension: Secondary | ICD-10-CM

## 2017-09-23 DIAGNOSIS — R000 Tachycardia, unspecified: Secondary | ICD-10-CM

## 2017-09-23 LAB — TSH: TSH: 6.082 u[IU]/mL — ABNORMAL HIGH (ref 0.350–4.500)

## 2017-09-23 LAB — CULTURE, BLOOD (ROUTINE X 2)
Culture: NO GROWTH
Culture: NO GROWTH
SPECIAL REQUESTS: ADEQUATE
Special Requests: ADEQUATE

## 2017-09-23 LAB — BASIC METABOLIC PANEL
Anion gap: 8 (ref 5–15)
BUN: 26 mg/dL — AB (ref 6–20)
CHLORIDE: 107 mmol/L (ref 101–111)
CO2: 21 mmol/L — AB (ref 22–32)
CREATININE: 1.78 mg/dL — AB (ref 0.44–1.00)
Calcium: 10.8 mg/dL — ABNORMAL HIGH (ref 8.9–10.3)
GFR calc Af Amer: 36 mL/min — ABNORMAL LOW (ref >60)
GFR calc non Af Amer: 31 mL/min — ABNORMAL LOW (ref >60)
GLUCOSE: 223 mg/dL — AB (ref 65–99)
POTASSIUM: 3.3 mmol/L — AB (ref 3.5–5.1)
SODIUM: 136 mmol/L (ref 135–145)

## 2017-09-23 LAB — ECHOCARDIOGRAM COMPLETE
HEIGHTINCHES: 63 in
Weight: 3107.6 oz

## 2017-09-23 LAB — T4, FREE: Free T4: 0.84 ng/dL (ref 0.82–1.77)

## 2017-09-23 MED ORDER — LABETALOL HCL 200 MG PO TABS
200.0000 mg | ORAL_TABLET | Freq: Two times a day (BID) | ORAL | Status: DC
  Administered 2017-09-23 – 2017-09-25 (×5): 200 mg via ORAL
  Filled 2017-09-23 (×5): qty 1

## 2017-09-23 MED ORDER — POTASSIUM CHLORIDE CRYS ER 20 MEQ PO TBCR
40.0000 meq | EXTENDED_RELEASE_TABLET | Freq: Once | ORAL | Status: AC
  Administered 2017-09-23: 40 meq via ORAL
  Filled 2017-09-23: qty 2

## 2017-09-23 NOTE — Progress Notes (Signed)
[facility] PROGRESS NOTE  Olivia Patel UXL:[id number] DOB: [birth year] DOA: 09/17/2017  PCP: Lance Sell, NP  Brief History/Interval Summary: 57 year old female with hypertension, anemia, arthritis presented with progressive 1 week history of generalized weakness, fall on the morning of admission.  Patient's daughter also reported slight confusion following the fall.  Patient had decreased concentration, no stupor, polydipsia, polyuria. Labs showed potassium 2.8, chloride 84, bicarb 37, creatinine 1.85, calcium greater than 15, magnesium 1.4.  Patient was hospitalized for further management.  Reason for Visit: Severe hypercalcemia  Consultants: Oncology: Dr. Benay Spice.  Interventional radiology  Procedures: Biopsy of the breast mass 5/29  Antibiotics: IV vancomycin from 5/26 to 5/28 IV Zosyn changed to Augmentin on 5/30  Subjective/Interval History: Patient feels well.  Denies any chest pain or shortness of breath although she can feel her heart racing.  Denies any nausea vomiting.  ROS: Denies any headaches.  Objective:  Vital Signs  Vitals:   09/22/17 1614 09/22/17 1926 09/22/17 2351 09/23/17 0339  BP: (!) 157/87 (!) 163/92 (!) 159/74 (!) 167/92  Pulse:  (!) 107 (!) 105 (!) 110  Resp:  18 17 18   Temp:  (!) 97.4 F (36.3 C) 98.9 F (37.2 C) 98.9 F (37.2 C)  TempSrc:  Oral Oral Oral  SpO2: 96% 97% 94% 99%  Weight:    88.1 kg (194 lb 3.6 oz)  Height:        Intake/Output Summary (Last 24 hours) at 09/23/2017 0804 Last data filed at 09/22/2017 1600 Gross per 24 hour  Intake 1466.25 ml  Output -  Net 1466.25 ml   Filed Weights   09/20/17 0316 09/21/17 0639 09/23/17 0339  Weight: 88.8 kg (195 lb 12.3 oz) 89.8 kg (197 lb 15.6 oz) 88.1 kg (194 lb 3.6 oz)    General appearance: Awake alert.  In no distress. Resp: Clear to auscultation bilaterally.  No wheezing rales or rhonchi. Cardio: S1-S2 is tachycardic regular.  No S3-S4.  No rubs murmurs or  bruit.  Telemetry reviewed patient has had sinus tachycardia for the last 2 to 3 days.  It is typically high in the morning and then tends to slow down during the afternoon. GI: Abdomen is soft.  Nontender nondistended.  Bowel sounds are present.  No masses organomegaly. Extremities: Improving edema bilateral upper and lower extremities. Neurologic: No focal neurological deficits.  Lab Results:  Data Reviewed: I have personally reviewed following labs and imaging studies  CBC: Recent Labs  Lab 09/17/17 1118 09/18/17 0542 09/19/17 0228 09/20/17 0400 09/21/17 0405 09/22/17 0304  WBC 16.3* 15.8* 14.5* 15.0* 15.0* 16.2*  NEUTROABS 13.3*  --   --   --   --   --   HGB 12.0 11.0* 10.1* 9.3* 9.0* 9.0*  HCT 36.5 34.1* 31.1* 28.8* 27.4* 28.0*  MCV 84.5 85.5 85.2 85.7 84.8 85.1  PLT 344 340 318 298 286 536    Basic Metabolic Panel: Recent Labs  Lab 09/17/17 1118  09/19/17 1508 09/20/17 0400 09/21/17 0405 09/22/17 0304 09/23/17 0212  NA 132*   < > 135 138 138 141 136  K 2.8*   < > 3.2* 3.2* 2.8* 3.6 3.3*  CL 84*   < > 97* 99* 102 107 107  CO2 37*   < > 27 25 24 23  21*  GLUCOSE 119*   < > 134* 125* 158* 159* 223*  BUN 42*   < > 52* 49* 41* 30* 26*  CREATININE 1.85*   < > 2.76* 2.61* 2.20*  1.97* 1.78*  CALCIUM >15.0*   < > 12.7* 11.5* 10.6* 10.9* 10.8*  MG 1.4*  --   --   --  1.3*  --   --    < > = values in this interval not displayed.    GFR: Estimated Creatinine Clearance: 38 mL/min (A) (by C-G formula based on SCr of 1.78 mg/dL (H)).  Liver Function Tests: Recent Labs  Lab 09/18/17 0542 09/18/17 2027 09/19/17 0228 09/20/17 0400 09/21/17 0405  AST 32 34 32 30 33  ALT 23 25 25 25 25   ALKPHOS 113 106 101 90 95  BILITOT 1.0 1.3* 1.3* 1.2 1.1  PROT 6.8 6.5 6.5 6.2* 5.9*  ALBUMIN 2.7* 2.6* 2.5* 2.3* 2.3*    Coagulation Profile: Recent Labs  Lab 09/21/17 0405  INR 1.39    Cardiac Enzymes: Recent Labs  Lab 09/17/17 1118  CKTOTAL 73    CBG: Recent Labs    Lab 09/17/17 1642 09/17/17 1958 09/18/17 0747 09/18/17 1213  GLUCAP 96 137* 105* 124*     Recent Results (from the past 240 hour(s))  Culture, blood (Routine X 2) w Reflex to ID Panel     Status: None (Preliminary result)   Collection Time: 09/18/17  1:31 PM  Result Value Ref Range Status   Specimen Description BLOOD RIGHT HAND  Final   Special Requests   Final    BOTTLES DRAWN AEROBIC AND ANAEROBIC Blood Culture adequate volume   Culture   Final    NO GROWTH 4 DAYS Performed at [facility], [place] [address] [place], [place] [address]    Report Status PENDING  Incomplete  Culture, blood (Routine X 2) w Reflex to ID Panel     Status: None (Preliminary result)   Collection Time: 09/18/17  1:31 PM  Result Value Ref Range Status   Specimen Description BLOOD RIGHT WRIST  Final   Special Requests   Final    BOTTLES DRAWN AEROBIC AND ANAEROBIC Blood Culture adequate volume   Culture   Final    NO GROWTH 4 DAYS Performed at [facility], [place] [address] [place], [place] [address]    Report Status PENDING  Incomplete  MRSA PCR Screening     Status: None   Collection Time: 09/20/17  1:58 PM  Result Value Ref Range Status   MRSA by PCR NEGATIVE NEGATIVE Final    Comment:        The GeneXpert MRSA Assay (FDA approved for NASAL specimens only), is one component of a comprehensive MRSA colonization surveillance program. It is not intended to diagnose MRSA infection nor to guide or monitor treatment for MRSA infections. Performed at [facility], [place] [address] [place], [place] [address]       Radiology Studies: No results found.   Medications:  Scheduled: . amLODipine  5 mg Oral Daily  . amoxicillin-clavulanate  1 tablet Oral Q12H  . feeding supplement (ENSURE ENLIVE)  237 mL Oral BID BM  . heparin  5,000 Units Subcutaneous Q8H  . hydrALAZINE  25 mg Oral Q8H  . labetalol  200 mg Oral BID  . multivitamin with minerals  1 tablet Oral Daily  .  potassium chloride  40 mEq Oral Once   Continuous: . sodium chloride 75 mL/hr at 09/22/17 1327  . sodium chloride     HWY:[id number] **OR** acetaminophen, bisacodyl, hydrALAZINE, HYDROcodone-acetaminophen, ondansetron **OR** ondansetron (ZOFRAN) IV, senna-docusate  Assessment/Plan:   Severe hypercalcemia Hypercalcemia most likely secondary to malignancy.  Patient found to have a mass in the right breast and is found to have multiple adenopathy on imaging studies. This is concerning for malignant breast cancer. Patient was given IV fluids and pamidronate. She also received Lasix. She was given 4 doses of calcitonin.  Calcium levels have improved.  Stable this morning.  Outpatient follow-up with oncology.  Right breast mass with adenopathy Patient underwent breast biopsy.  Preliminary results do suggest breast cancer.  She will be seen by oncology next week to discuss treatment options.  MRI brain negative for brain metastases. ? Hypokalemia Replace potassium.  Acute kidney injury Thought to be secondary to severe dehydration and hypercalcemia and ATN. Nephrology was consulted. Patient started on IV fluids.  Renal function is improving.  She has good urine output.  IV fluids will be discontinued.  Sinus tachycardia Patient noted to have sinus tachycardia.  EKG shows the same without any other acute changes.  Patient is asymptomatic.  TSH 6.08.  Free T4 normal at 0.84.  Echocardiogram is pending.  Acute respiratory failure with hypoxia and sepsis On 5/26 patient had tachypnea altered mental status and fevers. Procalcitonin 0.5. Chest x-ray showed bilateral pleural effusion and bibasilar hazy atelectasis versus airspace disease. Patient was started on IV vancomycin and Zosyn.  Cultures have been negative.  Vancomycin was discontinued.  Zosyn was changed over to Augmentin.  Respiratory status is stable.  Saturating normal on room air.   Normocytic anemia No evidence for overt bleeding.  Hemoglobin stable over the last 3 days.  Edema involving upper and lower extremities Likely multifactorial including hypoalbuminemia, IV fluids.  Improve nutritional status.  Cut back on IV fluids.    Acute metabolic encephalopathy Likely secondary to hypercalcemia. MRI brain negative for any metastatic process.  Status appears to be back to baseline.    Essential hypertension Blood pressure remains suboptimally controlled.  Increase dose of labetalol.  Continue with amlodipine and hydralazine as well.      DVT Prophylaxis: Subcutaneous heparin    Code Status: Full code Family Communication: Discussed with the patient and her sister Disposition Plan: Management as outlined above.  Will need home health at discharge.  Further work-up for sinus tachycardia today.  Anticipate discharge in the next 24 hours.      LOS: 6 days   [facility] Pager [phone] 09/23/2017, 8:04 AM  If 7PM-7AM, please contact night-coverage at [email or web address], password [facility]

## 2017-09-23 NOTE — Progress Notes (Addendum)
IP PROGRESS NOTE  Subjective:   She is alert.  She reports discomfort in the right knee and "hip".  She relates this to arthritis pain that she is experienced in the past.  She was out of bed yesterday.  No dyspnea or chest pain.  She has nasal congestion.  Objective: Vital signs in last 24 hours: Blood pressure (!) 167/92, pulse (!) 110, temperature 98.9 F (37.2 C), temperature source Oral, resp. rate 18, height 5\' 3"  (1.6 m), weight 194 lb 3.6 oz (88.1 kg), last menstrual period 05/29/2015, SpO2 99 %.  Intake/Output from previous day: 05/30 0701 - 05/31 0700 In: 1866.3 [P.O.:730; I.V.:1136.3] Out: -   Physical Exam: HEENT: No thrush Cardiac: Regular rate and rhythm, tachycardia Lungs: Decreased breath sounds at the posterior chest bilaterally, end inspiratory rhonchi versus rub at the upper posterior chest bilaterally, no respiratory distress Vascular: No leg edema Musculoskeletal: No pain with motion at the right hip.  No tenderness at the right femur   Lab Results: Recent Labs    09/21/17 0405 09/22/17 0304  WBC 15.0* 16.2*  HGB 9.0* 9.0*  HCT 27.4* 28.0*  PLT 286 274    BMET Recent Labs    09/22/17 0304 09/23/17 0212  NA 141 136  K 3.6 3.3*  CL 107 107  CO2 23 21*  GLUCOSE 159* 223*  BUN 30* 26*  CREATININE 1.97* 1.78*  CALCIUM 10.9* 10.8*     Medications: I have reviewed the patient's current medications.  Assessment/Plan: 1.  Metastatic breast cancer  Large right breast mass with diffuse skin thickening  CTs 09/19/2017 with mediastinal/hilar adenopathy, subcarinal adenopathy, widespread bone lesions, right breast mass with skin thickening  MRI of the brain 09/19/2017-negative for brain metastases  Ultrasound-guided biopsy of the right breast mass on 09/21/2017 2.  Hypercalcemia, status post pamidronate 09/17/2017, calcitonin initiated 09/18/2017 3.  Dehydration secondary to hypercalcemia, resolved 4.  Lytic bone lesions, rib fractures secondary to  #1 5.  Hypertension 6.  Renal failure-?  Secondary to ATN, improving 7.  Fever-cultures negative to date 8.  Anemia secondary to metastatic breast cancer and phlebotomy  Ms. Ante appears stable.  The final pathology report including the ER/PR assay is pending.  I discussed treatment options with Ms. Laubach and her sister.  If the tumor returns hormone receptor positive I will start her on letrozole today.  The calcium level and creatinine are improved today.  She complains of pain at the right hip and knee.  I will obtain a plain x-ray of the right hip and femur to be sure she does not have a significant lytic lesion.  The etiology of the tachycardia is unclear.  She is tachycardic while on labetalol.  I doubt the tachycardia is related to anemia.  No evidence for a systemic infection.  I have a low clinical suspicion for pulmonary embolism, but this needs to be considered despite the heparin DVT prophylaxis.  Recommendations: 1.  Follow-up on the final pathology including ER/PR from the right breast biopsy 2.  Begin aromatase inhibitor therapy if the tumor returns hormone receptor positive 3.  Plain x-ray of the right hip and femur 4.  Further evaluation if the tachycardia persists 5.  Outpatient follow-up will be [facility] at [facility] for 09/27/2017  Please call oncology as needed over the weekend.  I will check on her 09/26/2017 if she remains in the hospital.      LOS: 6 days   Betsy Coder, MD   09/23/2017, 6:47  AM

## 2017-09-23 NOTE — Care Management Note (Addendum)
Case Management Note  Patient Details  Name: Ismael Treptow MRN: [id number] Date of Birth: 21-Jan-1961  Subjective/Objective:  Pt admitted with severe hypercalcemia - pt now dx with breast CA                  Action/Plan:   PTA independent from home with adult daughters.  Pt has PCP and denied barriers with obtaining/paying for medications.  Pt is in agreement with HHPT, OT and DME. CM gave pt [facility] agency choice list to look over - DME ordered from [facility] - referral accepted.  CM requested orders   Expected Discharge Date:                  Expected Discharge Plan:  [place]  In-House Referral:     Discharge planning Services  CM Consult  Post Acute Care Choice:    Choice offered to:     DME Arranged:  3-N-1, [place] rolling with seat DME Agency:  [place]:    [place]:     Status of Service:     If discussed at [place] of Stay Meetings, dates discussed:    Additional Comments:  Maryclare Labrador, RN 09/23/2017, 4:28 PM

## 2017-09-23 NOTE — Telephone Encounter (Signed)
Spoke to the pt's daughter and scheduled the pt to see Dr. Benay Spice on 6/4 at 11am. She's aware to have her mom arrive 30 minutes early so that she can be checked in on time. Voiced understanding.

## 2017-09-23 NOTE — Progress Notes (Signed)
  Echocardiogram 2D Echocardiogram has been performed.  [name] [name] 09/23/2017, 12:04 PM

## 2017-09-24 ENCOUNTER — Inpatient Hospital Stay (HOSPITAL_COMMUNITY): Payer: Medicare Other

## 2017-09-24 LAB — CBC
HCT: 26.1 % — ABNORMAL LOW (ref 36.0–46.0)
HEMOGLOBIN: 8.5 g/dL — AB (ref 12.0–15.0)
MCH: 27.8 pg (ref 26.0–34.0)
MCHC: 32.6 g/dL (ref 30.0–36.0)
MCV: 85.3 fL (ref 78.0–100.0)
Platelets: 259 10*3/uL (ref 150–400)
RBC: 3.06 MIL/uL — AB (ref 3.87–5.11)
RDW: 14.3 % (ref 11.5–15.5)
WBC: 22.3 10*3/uL — AB (ref 4.0–10.5)

## 2017-09-24 LAB — BASIC METABOLIC PANEL
Anion gap: 11 (ref 5–15)
BUN: 20 mg/dL (ref 6–20)
CHLORIDE: 101 mmol/L (ref 101–111)
CO2: 22 mmol/L (ref 22–32)
CREATININE: 1.57 mg/dL — AB (ref 0.44–1.00)
Calcium: 11.7 mg/dL — ABNORMAL HIGH (ref 8.9–10.3)
GFR calc Af Amer: 42 mL/min — ABNORMAL LOW (ref >60)
GFR calc non Af Amer: 36 mL/min — ABNORMAL LOW (ref >60)
Glucose, Bld: 176 mg/dL — ABNORMAL HIGH (ref 65–99)
POTASSIUM: 3.6 mmol/L (ref 3.5–5.1)
SODIUM: 134 mmol/L — AB (ref 135–145)

## 2017-09-24 LAB — MAGNESIUM: MAGNESIUM: 1.2 mg/dL — AB (ref 1.7–2.4)

## 2017-09-24 MED ORDER — FLUTICASONE PROPIONATE 50 MCG/ACT NA SUSP
2.0000 | Freq: Every day | NASAL | Status: DC
  Administered 2017-09-24: 2 via NASAL
  Filled 2017-09-24: qty 16

## 2017-09-24 MED ORDER — MAGNESIUM SULFATE 2 GM/50ML IV SOLN
2.0000 g | Freq: Once | INTRAVENOUS | Status: AC
  Administered 2017-09-24: 2 g via INTRAVENOUS
  Filled 2017-09-24: qty 50

## 2017-09-24 MED ORDER — POTASSIUM CHLORIDE CRYS ER 20 MEQ PO TBCR
40.0000 meq | EXTENDED_RELEASE_TABLET | Freq: Once | ORAL | Status: AC
  Administered 2017-09-24: 40 meq via ORAL
  Filled 2017-09-24: qty 2

## 2017-09-24 MED ORDER — ZOLEDRONIC ACID 4 MG/5ML IV CONC
3.3000 mg | Freq: Once | INTRAVENOUS | Status: AC
  Administered 2017-09-24: 3.3 mg via INTRAVENOUS
  Filled 2017-09-24: qty 4.13

## 2017-09-24 NOTE — Progress Notes (Signed)
[facility] PROGRESS NOTE  Olivia Patel WUX:[id number] DOB: 10-16-1960 DOA: 09/17/2017  PCP: Lance Sell, NP  Brief History/Interval Summary: 57 year old female with hypertension, anemia, arthritis presented with progressive 1 week history of generalized weakness, fall on the morning of admission.  Patient's daughter also reported slight confusion following the fall.  Patient had decreased concentration, no stupor, polydipsia, polyuria. Labs showed potassium 2.8, chloride 84, bicarb 37, creatinine 1.85, calcium greater than 15, magnesium 1.4.  Patient was hospitalized for further management.  Reason for Visit: Severe hypercalcemia  Consultants: Oncology: Dr. Benay Spice.  Interventional radiology  Procedures:  Biopsy of the breast mass 5/29  Transthoracic echocardiogram Study Conclusions  - Left ventricle: The cavity size was normal. Systolic function was   vigorous. The estimated ejection fraction was in the range of 65%   to 70%. Wall motion was normal; there were no regional wall   motion abnormalities. - Atrial septum: No defect or patent foramen ovale was identified.  Antibiotics: IV vancomycin from 5/26 to 5/28 IV Zosyn changed to Augmentin on 5/30  Subjective/Interval History: Patient denies any pain.  Occasional cough but not as much as before.  Denies any shortness of breath.  No fever.  No diarrhea.  No chest pain.    ROS: Denies any headaches.  Objective:  Vital Signs  Vitals:   09/23/17 2322 09/24/17 0313 09/24/17 0614 09/24/17 0800  BP: (!) 166/92 (!) 172/86  (!) 146/109  Pulse: (!) 108 (!) 111  99  Resp: 18 19  18   Temp: 99.4 F (37.4 C) 99.3 F (37.4 C)  98.6 F (37 C)  TempSrc: Oral Oral  Oral  SpO2: 99% 98%  95%  Weight:   87.2 kg (192 lb 3.9 oz)   Height:        Intake/Output Summary (Last 24 hours) at 09/24/2017 0812 Last data filed at 09/24/2017 0800 Gross per 24 hour  Intake 1389.58 ml  Output 400 ml  Net 989.58 ml    Filed Weights   09/21/17 0639 09/23/17 0339 09/24/17 0614  Weight: 89.8 kg (197 lb 15.6 oz) 88.1 kg (194 lb 3.6 oz) 87.2 kg (192 lb 3.9 oz)    General appearance: Awake alert.  In no distress. Resp: Clear to auscultation bilaterally.  No obvious wheezes, rales or rhonchi. Cardio: S1-S2 is tachycardic regular.  No S3-S4.  No rubs murmurs or bruit.  Telemetry continues to show sinus tachycardia.  Heart rate tends to be in the 90-100 range in the late morning to afternoon time and over the course of the night and then increases during the early morning hours.  The area over the breast biopsy site looks clean without any swelling or tenderness. GI: Abdomen remains soft.  Nontender nondistended. Extremities: Edema continues to improve in the upper and lower extremities. Neurologic: No focal neurological deficits.  Lab Results:  Data Reviewed: I have personally reviewed following labs and imaging studies  CBC: Recent Labs  Lab 09/17/17 1118  09/19/17 0228 09/20/17 0400 09/21/17 0405 09/22/17 0304 09/24/17 0345  WBC 16.3*   < > 14.5* 15.0* 15.0* 16.2* 22.3*  NEUTROABS 13.3*  --   --   --   --   --   --   HGB 12.0   < > 10.1* 9.3* 9.0* 9.0* 8.5*  HCT 36.5   < > 31.1* 28.8* 27.4* 28.0* 26.1*  MCV 84.5   < > 85.2 85.7 84.8 85.1 85.3  PLT 344   < > 318 298 286 274 259   < > =  values in this interval not displayed.    Basic Metabolic Panel: Recent Labs  Lab 09/17/17 1118  09/20/17 0400 09/21/17 0405 09/22/17 0304 09/23/17 0212 09/24/17 0345  NA 132*   < > 138 138 141 136 134*  K 2.8*   < > 3.2* 2.8* 3.6 3.3* 3.6  CL 84*   < > 99* 102 107 107 101  CO2 37*   < > 25 24 23  21* 22  GLUCOSE 119*   < > 125* 158* 159* 223* 176*  BUN 42*   < > 49* 41* 30* 26* 20  CREATININE 1.85*   < > 2.61* 2.20* 1.97* 1.78* 1.57*  CALCIUM >15.0*   < > 11.5* 10.6* 10.9* 10.8* 11.7*  MG 1.4*  --   --  1.3*  --   --   --    < > = values in this interval not displayed.    GFR: Estimated  Creatinine Clearance: 42.9 mL/min (A) (by C-G formula based on SCr of 1.57 mg/dL (H)).  Liver Function Tests: Recent Labs  Lab 09/18/17 0542 09/18/17 2027 09/19/17 0228 09/20/17 0400 09/21/17 0405  AST 32 34 32 30 33  ALT 23 25 25 25 25   ALKPHOS 113 106 101 90 95  BILITOT 1.0 1.3* 1.3* 1.2 1.1  PROT 6.8 6.5 6.5 6.2* 5.9*  ALBUMIN 2.7* 2.6* 2.5* 2.3* 2.3*    Coagulation Profile: Recent Labs  Lab 09/21/17 0405  INR 1.39    Cardiac Enzymes: Recent Labs  Lab 09/17/17 1118  CKTOTAL 73    CBG: Recent Labs  Lab 09/17/17 1642 09/17/17 1958 09/18/17 0747 09/18/17 1213  GLUCAP 96 137* 105* 124*     Recent Results (from the past 240 hour(s))  Culture, blood (Routine X 2) w Reflex to ID Panel     Status: None   Collection Time: 09/18/17  1:31 PM  Result Value Ref Range Status   Specimen Description BLOOD RIGHT HAND  Final   Special Requests   Final    BOTTLES DRAWN AEROBIC AND ANAEROBIC Blood Culture adequate volume   Culture   Final    NO GROWTH 5 DAYS Performed at [facility], [place] [address] [place], [place] [address]    Report Status 09/23/2017 FINAL  Final  Culture, blood (Routine X 2) w Reflex to ID Panel     Status: None   Collection Time: 09/18/17  1:31 PM  Result Value Ref Range Status   Specimen Description BLOOD RIGHT WRIST  Final   Special Requests   Final    BOTTLES DRAWN AEROBIC AND ANAEROBIC Blood Culture adequate volume   Culture   Final    NO GROWTH 5 DAYS Performed at [facility], [place] [address] [place], [place] [address]    Report Status 09/23/2017 FINAL  Final  MRSA PCR Screening     Status: None   Collection Time: 09/20/17  1:58 PM  Result Value Ref Range Status   MRSA by PCR NEGATIVE NEGATIVE Final    Comment:        The GeneXpert MRSA Assay (FDA approved for NASAL specimens only), is one component of a comprehensive MRSA colonization surveillance program. It is not intended to diagnose MRSA infection nor to guide  or monitor treatment for MRSA infections. Performed at [facility], [place] [address] [place], [place] [address]       Radiology Studies: Dg Pelvis 1-2 Views  Result Date: 09/23/2017 CLINICAL DATA:  Right hip  pain since 2014. Multiple lytic bone metastases on a recent chest, abdomen and pelvis CT. EXAM: PELVIS - 1-2 VIEW COMPARISON:  None. FINDINGS: Moderate right femoral head and neck junction spur formation with mild joint space narrowing. Mild to moderate left femoral head and neck junction spur formation. Previously demonstrated calcified uterine fibroids. IMPRESSION: Bilateral hip degenerative changes, greater on the right. Electronically Signed   By: Claudie Revering M.D.   On: 09/23/2017 12:37   Dg Femur, Min 2 Views Right  Result Date: 09/23/2017 CLINICAL DATA:  Right femur pain.  No acute trauma. EXAM: RIGHT FEMUR 2 VIEWS COMPARISON:  None. FINDINGS: Degenerative changes are noted at the right hip and the right knee. There is no significant effusion. No acute or healing fractures are present. Degenerative changes of the knee are most prominent in the patellofemoral and medial component. A 4.5 cm calcified fibroid is stable. IMPRESSION: 1. Degenerative changes are present at the right hip and right knee. 2. No acute abnormality. Electronically Signed   By: San Morelle M.D.   On: 09/23/2017 12:43     Medications:  Scheduled: . amLODipine  5 mg Oral Daily  . amoxicillin-clavulanate  1 tablet Oral Q12H  . feeding supplement (ENSURE ENLIVE)  237 mL Oral BID BM  . fluticasone  2 spray Each Nare Daily  . heparin  5,000 Units Subcutaneous Q8H  . hydrALAZINE  25 mg Oral Q8H  . labetalol  200 mg Oral BID  . multivitamin with minerals  1 tablet Oral Daily   Continuous: . sodium chloride 10 mL/hr at 09/23/17 1155  . sodium chloride    . zoledronic acid (ZOMETA) IV     ZTI:[id number] **OR** acetaminophen, bisacodyl, hydrALAZINE, HYDROcodone-acetaminophen, ondansetron  **OR** ondansetron (ZOFRAN) IV, senna-docusate  Assessment/Plan:   Severe hypercalcemia Hypercalcemia most likely secondary to malignancy.  Patient found to have a mass in the right breast and is found to have multiple adenopathy on imaging studies. This is concerning for malignant breast cancer. Patient was given IV fluids and pamidronate. She also received Lasix. She was given 4 doses of calcitonin.  Calcium level did improve.  However declined at this morning at 11.7.  Based on discussions with Dr. Benay Spice yesterday we will give the patient a dose of Zometa.   Right breast mass with adenopathy Patient underwent breast biopsy.  Otology does suggest breast cancer.  She will be seen by oncology next week to discuss treatment options.  MRI brain negative for brain metastases. ? Hypokalemia and hypomagnesemia Potassium level has improved.  He is also noted to have low magnesium level which will be repleted.  Acute kidney injury Thought to be secondary to severe dehydration and hypercalcemia and ATN. Nephrology was consulted. Patient started on IV fluids.  Renal function is improving.  She has good urine output.  IV fluids were discontinued due to edema.  Sinus tachycardia Patient noted to have sinus tachycardia.  EKG shows the same without any other acute changes.  Patient is asymptomatic.  TSH 6.08.  Free T4 normal at 0.84.  Echocardiogram showed normal systolic function.  No concerning abnormalities noted otherwise.  Right ventricular function was also normal.  He is in for tachycardia is not entirely clear but could be due to metabolic derangements, deconditioning, pain issues or malignancy.  Acute respiratory failure with hypoxia and sepsis On 5/26 patient had tachypnea altered mental status and fevers. Procalcitonin 0.5. Chest x-ray showed bilateral pleural effusion and bibasilar hazy atelectasis versus airspace disease. Patient was started on  IV vancomycin and Zosyn.  Cultures were  negative.  Vancomycin was discontinued.  Zosyn was changed over to Augmentin.  Respiratory status is stable.  Saturating normal on room air.  Chest x-ray was repeated today due to rise in WBC.  No concerning findings noted.  Leukocytosis Rising WBC is noted.  She is afebrile.  No clear source of a new infection is found.  Patient is already on antibiotics for pneumonia.  Chest x-ray does not show any concerning findings.  Repeat labs tomorrow.  We will also get a differential. Denies any diarrhea.  Will check a UA as well..  Normocytic anemia No evidence for overt bleeding. Hemoglobin stable over the last 3 days.  Edema involving upper and lower extremities Likely multifactorial including hypoalbuminemia, IV fluids.  Improve nutritional status.     Acute metabolic encephalopathy Likely secondary to hypercalcemia. MRI brain negative for any metastatic process.  Status appears to be back to baseline.    Essential hypertension Pressure was suboptimally controlled.  Dose of labetalol has been increased as of 5/31.  Continue to monitor for another 24 hours.  Continue with amlodipine and hydralazine as well.      DVT Prophylaxis: Subcutaneous heparin    Code Status: Full code Family Communication: Discussed with patient and her sister. Disposition Plan: Management as outlined above.  Will need home health at discharge.  Calcium level noted to be higher today.  Continues to have sinus tachycardia.  WBC also noted to be higher today.    LOS: 7 days   [facility] Pager [phone] 09/24/2017, 8:12 AM  If 7PM-7AM, please contact night-coverage at [email or web address], password [facility]

## 2017-09-24 NOTE — Progress Notes (Signed)
At 1230 patient was informed that we need her urine for a UA. Patient still unable to go at this time, will check back.

## 2017-09-25 DIAGNOSIS — D72829 Elevated white blood cell count, unspecified: Secondary | ICD-10-CM

## 2017-09-25 LAB — CBC WITH DIFFERENTIAL/PLATELET
Basophils Absolute: 0 10*3/uL (ref 0.0–0.1)
Basophils Relative: 0 %
EOS ABS: 0 10*3/uL (ref 0.0–0.7)
Eosinophils Relative: 0 %
HCT: 25.7 % — ABNORMAL LOW (ref 36.0–46.0)
Hemoglobin: 8.4 g/dL — ABNORMAL LOW (ref 12.0–15.0)
LYMPHS ABS: 2.5 10*3/uL (ref 0.7–4.0)
Lymphocytes Relative: 11 %
MCH: 27.5 pg (ref 26.0–34.0)
MCHC: 32.7 g/dL (ref 30.0–36.0)
MCV: 84.3 fL (ref 78.0–100.0)
MONOS PCT: 4 %
Monocytes Absolute: 0.9 10*3/uL (ref 0.1–1.0)
NEUTROS ABS: 18.9 10*3/uL — AB (ref 1.7–7.7)
NRBC: 1 /100{WBCs} — AB
Neutrophils Relative %: 85 %
PLATELETS: 257 10*3/uL (ref 150–400)
RBC: 3.05 MIL/uL — AB (ref 3.87–5.11)
RDW: 14.6 % (ref 11.5–15.5)
WBC: 22.3 10*3/uL — AB (ref 4.0–10.5)

## 2017-09-25 LAB — URINALYSIS, ROUTINE W REFLEX MICROSCOPIC
BACTERIA UA: NONE SEEN
Bilirubin Urine: NEGATIVE
GLUCOSE, UA: NEGATIVE mg/dL
KETONES UR: 5 mg/dL — AB
Leukocytes, UA: NEGATIVE
NITRITE: NEGATIVE
Protein, ur: 30 mg/dL — AB
Specific Gravity, Urine: 1.011 (ref 1.005–1.030)
pH: 6 (ref 5.0–8.0)

## 2017-09-25 LAB — BASIC METABOLIC PANEL
Anion gap: 10 (ref 5–15)
BUN: 19 mg/dL (ref 6–20)
CALCIUM: 12.1 mg/dL — AB (ref 8.9–10.3)
CHLORIDE: 100 mmol/L — AB (ref 101–111)
CO2: 24 mmol/L (ref 22–32)
CREATININE: 1.52 mg/dL — AB (ref 0.44–1.00)
GFR calc Af Amer: 44 mL/min — ABNORMAL LOW (ref >60)
GFR calc non Af Amer: 38 mL/min — ABNORMAL LOW (ref >60)
GLUCOSE: 182 mg/dL — AB (ref 65–99)
Potassium: 3.3 mmol/L — ABNORMAL LOW (ref 3.5–5.1)
Sodium: 134 mmol/L — ABNORMAL LOW (ref 135–145)

## 2017-09-25 LAB — MAGNESIUM: Magnesium: 1.7 mg/dL (ref 1.7–2.4)

## 2017-09-25 MED ORDER — MAGNESIUM SULFATE IN D5W 1-5 GM/100ML-% IV SOLN
1.0000 g | Freq: Once | INTRAVENOUS | Status: AC
  Administered 2017-09-25: 1 g via INTRAVENOUS
  Filled 2017-09-25: qty 100

## 2017-09-25 MED ORDER — POTASSIUM CHLORIDE CRYS ER 20 MEQ PO TBCR
40.0000 meq | EXTENDED_RELEASE_TABLET | Freq: Once | ORAL | Status: AC
  Administered 2017-09-25: 40 meq via ORAL
  Filled 2017-09-25: qty 2

## 2017-09-25 MED ORDER — LABETALOL HCL 100 MG PO TABS
100.0000 mg | ORAL_TABLET | Freq: Once | ORAL | Status: AC
  Administered 2017-09-25: 100 mg via ORAL
  Filled 2017-09-25: qty 1

## 2017-09-25 MED ORDER — LABETALOL HCL 200 MG PO TABS
300.0000 mg | ORAL_TABLET | Freq: Two times a day (BID) | ORAL | Status: DC
  Administered 2017-09-25 – 2017-09-26 (×2): 300 mg via ORAL
  Filled 2017-09-25 (×2): qty 1

## 2017-09-25 NOTE — Progress Notes (Signed)
[facility] PROGRESS NOTE  Lorely Bubb UXN:[id number] DOB: 10/28/1960 DOA: 09/17/2017  PCP: Lance Sell, NP  Brief History/Interval Summary: 57 year old female with hypertension, anemia, arthritis presented with progressive 1 week history of generalized weakness, fall on the morning of admission.  Patient's daughter also reported slight confusion following the fall.  Patient had decreased concentration, no stupor, polydipsia, polyuria. Labs showed potassium 2.8, chloride 84, bicarb 37, creatinine 1.85, calcium greater than 15, magnesium 1.4.  Patient was hospitalized for further management.  Patient was found to have a right breast mass.  Underwent biopsy consistent with breast cancer.  Subsequently noted to have sinus tachycardia and leukocytosis.  Reason for Visit: Severe hypercalcemia  Consultants: Oncology: Dr. Benay Spice.  Interventional radiology  Procedures:  Biopsy of the breast mass 5/29  Transthoracic echocardiogram Study Conclusions  - Left ventricle: The cavity size was normal. Systolic function was   vigorous. The estimated ejection fraction was in the range of 65%   to 70%. Wall motion was normal; there were no regional wall   motion abnormalities. - Atrial septum: No defect or patent foramen ovale was identified.  Antibiotics: IV vancomycin from 5/26 to 5/28 IV Zosyn changed to Augmentin on 5/30  Subjective/Interval History: Patient denies any pain.  No shortness of breath.  Cough is improved.  No nausea or vomiting.  No diarrhea.  Denies any discomfort with urinating.  ROS: Denies any headaches.  Objective:  Vital Signs  Vitals:   09/24/17 2314 09/25/17 0511 09/25/17 0521 09/25/17 0802  BP: (!) 160/79 (!) 170/79  (!) 175/79  Pulse: 98 (!) 108  (!) 105  Resp: 17 17    Temp: 99.6 F (37.6 C) 100.3 F (37.9 C)  99.5 F (37.5 C)  TempSrc: Oral Oral  Oral  SpO2: 96% 95%  95%  Weight:   86.5 kg (190 lb 9.6 oz)   Height:         Intake/Output Summary (Last 24 hours) at 09/25/2017 0808 Last data filed at 09/25/2017 0700 Gross per 24 hour  Intake 680 ml  Output 1625 ml  Net -945 ml   Filed Weights   09/23/17 0339 09/24/17 0614 09/25/17 0521  Weight: 88.1 kg (194 lb 3.6 oz) 87.2 kg (192 lb 3.9 oz) 86.5 kg (190 lb 9.6 oz)    General appearance: Awake alert.  In no distress. Resp: Clear to auscultation bilaterally.  No obvious wheezing rales or rhonchi. Cardio: S1-S2 remains mildly tachycardic regular.  No S3-S4.  No rubs murmurs or bruit.  Telemetry continues to show sinus tachycardia however heart rate appears to be better compared to yesterday. GI: Abdomen is soft.  Nontender nondistended.  Bowel sounds are present.  No masses organomegaly. Extremities: Edema continues to improve in the lower extremities Neurologic: No focal neurological deficits  Lab Results:  Data Reviewed: I have personally reviewed following labs and imaging studies  CBC: Recent Labs  Lab 09/19/17 0228 09/20/17 0400 09/21/17 0405 09/22/17 0304 09/24/17 0345  WBC 14.5* 15.0* 15.0* 16.2* 22.3*  HGB 10.1* 9.3* 9.0* 9.0* 8.5*  HCT 31.1* 28.8* 27.4* 28.0* 26.1*  MCV 85.2 85.7 84.8 85.1 85.3  PLT 318 298 286 274 254    Basic Metabolic Panel: Recent Labs  Lab 09/21/17 0405 09/22/17 0304 09/23/17 0212 09/24/17 0345 09/24/17 0934 09/25/17 0246  NA 138 141 136 134*  --  134*  K 2.8* 3.6 3.3* 3.6  --  3.3*  CL 102 107 107 101  --  100*  CO2 24 23  21* 22  --  24  GLUCOSE 158* 159* 223* 176*  --  182*  BUN 41* 30* 26* 20  --  19  CREATININE 2.20* 1.97* 1.78* 1.57*  --  1.52*  CALCIUM 10.6* 10.9* 10.8* 11.7*  --  12.1*  MG 1.3*  --   --   --  1.2* 1.7    GFR: Estimated Creatinine Clearance: 44.1 mL/min (A) (by C-G formula based on SCr of 1.52 mg/dL (H)).  Liver Function Tests: Recent Labs  Lab 09/18/17 2027 09/19/17 0228 09/20/17 0400 09/21/17 0405  AST 34 32 30 33  ALT 25 25 25 25   ALKPHOS 106 101 90 95  BILITOT  1.3* 1.3* 1.2 1.1  PROT 6.5 6.5 6.2* 5.9*  ALBUMIN 2.6* 2.5* 2.3* 2.3*    Coagulation Profile: Recent Labs  Lab 09/21/17 0405  INR 1.39    CBG: Recent Labs  Lab 09/18/17 1213  GLUCAP 124*     Recent Results (from the past 240 hour(s))  Culture, blood (Routine X 2) w Reflex to ID Panel     Status: None   Collection Time: 09/18/17  1:31 PM  Result Value Ref Range Status   Specimen Description BLOOD RIGHT HAND  Final   Special Requests   Final    BOTTLES DRAWN AEROBIC AND ANAEROBIC Blood Culture adequate volume   Culture   Final    NO GROWTH 5 DAYS Performed at [facility], [place] [address] [place], [place] [address]    Report Status 09/23/2017 FINAL  Final  Culture, blood (Routine X 2) w Reflex to ID Panel     Status: None   Collection Time: 09/18/17  1:31 PM  Result Value Ref Range Status   Specimen Description BLOOD RIGHT WRIST  Final   Special Requests   Final    BOTTLES DRAWN AEROBIC AND ANAEROBIC Blood Culture adequate volume   Culture   Final    NO GROWTH 5 DAYS Performed at [facility], [place] [address] [place], [place] [address]    Report Status 09/23/2017 FINAL  Final  MRSA PCR Screening     Status: None   Collection Time: 09/20/17  1:58 PM  Result Value Ref Range Status   MRSA by PCR NEGATIVE NEGATIVE Final    Comment:        The GeneXpert MRSA Assay (FDA approved for NASAL specimens only), is one component of a comprehensive MRSA colonization surveillance program. It is not intended to diagnose MRSA infection nor to guide or monitor treatment for MRSA infections. Performed at [facility], [place] [address] [place], [place] [address]       Radiology Studies: Dg Pelvis 1-2 Views  Result Date: 09/23/2017 CLINICAL DATA:  Right hip pain since 2014. Multiple lytic bone metastases on a recent chest, abdomen and pelvis CT. EXAM: PELVIS - 1-2 VIEW COMPARISON:  None. FINDINGS: Moderate right femoral head and neck junction spur formation  with mild joint space narrowing. Mild to moderate left femoral head and neck junction spur formation. Previously demonstrated calcified uterine fibroids. IMPRESSION: Bilateral hip degenerative changes, greater on the right. Electronically Signed   By: Claudie Revering M.D.   On: 09/23/2017 12:37   Dg Chest Port 1 View  Result Date: 09/24/2017 CLINICAL DATA:  Cough EXAM: PORTABLE CHEST 1 VIEW COMPARISON:  09/18/2017 FINDINGS: Heart size is normal. Mediastinal shadows are normal. The right chest appears clear. There is mild blunting of the costophrenic angle on the left consistent  with a small left effusion. IMPRESSION: The only abnormal chest radiographic finding is blunting of the costophrenic angle on the left consistent with a small left effusion. Electronically Signed   By: Nelson Chimes M.D.   On: 09/24/2017 09:22   Dg Femur, Min 2 Views Right  Result Date: 09/23/2017 CLINICAL DATA:  Right femur pain.  No acute trauma. EXAM: RIGHT FEMUR 2 VIEWS COMPARISON:  None. FINDINGS: Degenerative changes are noted at the right hip and the right knee. There is no significant effusion. No acute or healing fractures are present. Degenerative changes of the knee are most prominent in the patellofemoral and medial component. A 4.5 cm calcified fibroid is stable. IMPRESSION: 1. Degenerative changes are present at the right hip and right knee. 2. No acute abnormality. Electronically Signed   By: San Morelle M.D.   On: 09/23/2017 12:43     Medications:  Scheduled: . amLODipine  5 mg Oral Daily  . amoxicillin-clavulanate  1 tablet Oral Q12H  . feeding supplement (ENSURE ENLIVE)  237 mL Oral BID BM  . fluticasone  2 spray Each Nare Daily  . heparin  5,000 Units Subcutaneous Q8H  . hydrALAZINE  25 mg Oral Q8H  . labetalol  200 mg Oral BID  . multivitamin with minerals  1 tablet Oral Daily  . potassium chloride  40 mEq Oral Once   Continuous: . sodium chloride 10 mL/hr at 09/23/17 1155  . magnesium sulfate  1 - 4 g bolus IVPB    . sodium chloride     UYQ:[id number] **OR** acetaminophen, bisacodyl, hydrALAZINE, HYDROcodone-acetaminophen, ondansetron **OR** ondansetron (ZOFRAN) IV, senna-docusate  Assessment/Plan:   Severe hypercalcemia Hypercalcemia is most likely secondary to malignancy.  Patient found to have a mass in the right breast and is found to have multiple adenopathy on imaging studies. This is concerning for malignant breast cancer. Patient was given IV fluids and pamidronate. She also received Lasix. She was given 4 doses of calcitonin.  Calcium level did improve.  However calcium noted to be elevated at 11.7.  Has been trending upwards.  So patient was given a dose of Zometa on 6/1.  Will need to wait for downtrend in calcium level.  We will also wait on oncology input tomorrow.    Right breast mass with adenopathy Patient underwent breast biopsy.  Pathology does suggest breast cancer.  She will be seen by oncology next week to discuss treatment options.  MRI brain negative for brain metastases. ? Hypokalemia and hypomagnesemia Potassium will be repleted.  Magnesium level has improved.  She will be given another dose of magnesium sulfate today.    Acute kidney injury Thought to be secondary to severe dehydration and hypercalcemia and ATN. Nephrology was consulted. Patient started on IV fluids.  Renal function has improved significantly.  She has reasonably good urine output.  Continue to monitor.  Sinus tachycardia Patient noted to have sinus tachycardia.  EKG shows the same without any other acute changes.  Patient is asymptomatic.  TSH 6.08.  Free T4 normal at 0.84.  Echocardiogram showed normal systolic function.  No concerning abnormalities noted otherwise.  Right ventricular function was also normal.  Etiology for the sinus tachycardia is not entirely clear but could be due to metabolic derangements, deconditioning, pain issues or malignancy.  Heart rate appears to be stable.   Continue to monitor.    Acute respiratory failure with hypoxia and sepsis/community-acquired versus aspiration pneumonia On 5/26 patient had tachypnea altered mental status and fevers. Procalcitonin 0.5.  Chest x-ray showed bilateral pleural effusion and bibasilar hazy atelectasis versus airspace disease. Patient was started on IV vancomycin and Zosyn.  Cultures were negative.  Vancomycin was discontinued.  Zosyn was changed over to Augmentin.  Respiratory status is stable.  Saturating normal on room air.  Chest x-ray was repeated on 6/1 and did not show any concerning changes.  Leukocytosis Elevated WBC was noted yesterday.  She is afebrile.  No clear source of her new infection.  UA unremarkable.  Patient denies any dysuria.  Labs are stable this morning.  Patient denies any diarrhea.  Continue to watch for now.    Normocytic anemia Hemoglobin has been stable.  No evidence for overt bleeding.  Edema involving upper and lower extremities Likely multifactorial including hypoalbuminemia, IV fluids.  Improve nutritional status.   Edema has improved.  Acute metabolic encephalopathy Likely secondary to hypercalcemia. MRI brain negative for any metastatic process.  Mental status appears to be back to baseline.    Essential hypertension Blood pressure has been suboptimally controlled.  Dose of labetalol was increased on 5/31.  We will further increase the dose today.  Continue amlodipine and hydralazine as well.      DVT Prophylaxis: Subcutaneous heparin    Code Status: Full code Family Communication: Discussed with patient and her sister. Disposition Plan: Management as outlined above.  She will need home health at discharge.  Continue to wait on improvement in calcium levels.  Await further oncology input tomorrow.  Hopefully discharge in 1 to 2 days.      LOS: 8 days   [facility] Pager [phone] 09/25/2017, 8:08 AM  If 7PM-7AM, please contact night-coverage at  [email or web address], password [facility]

## 2017-09-26 ENCOUNTER — Telehealth: Payer: Self-pay | Admitting: Internal Medicine

## 2017-09-26 DIAGNOSIS — C50911 Malignant neoplasm of unspecified site of right female breast: Secondary | ICD-10-CM

## 2017-09-26 LAB — CBC
HCT: 24.8 % — ABNORMAL LOW (ref 36.0–46.0)
Hemoglobin: 8.1 g/dL — ABNORMAL LOW (ref 12.0–15.0)
MCH: 28.1 pg (ref 26.0–34.0)
MCHC: 32.7 g/dL (ref 30.0–36.0)
MCV: 86.1 fL (ref 78.0–100.0)
PLATELETS: 261 10*3/uL (ref 150–400)
RBC: 2.88 MIL/uL — ABNORMAL LOW (ref 3.87–5.11)
RDW: 14.6 % (ref 11.5–15.5)
WBC: 21.8 10*3/uL — ABNORMAL HIGH (ref 4.0–10.5)

## 2017-09-26 LAB — BASIC METABOLIC PANEL
ANION GAP: 10 (ref 5–15)
BUN: 26 mg/dL — ABNORMAL HIGH (ref 6–20)
CALCIUM: 11.4 mg/dL — AB (ref 8.9–10.3)
CO2: 25 mmol/L (ref 22–32)
CREATININE: 1.51 mg/dL — AB (ref 0.44–1.00)
Chloride: 99 mmol/L — ABNORMAL LOW (ref 101–111)
GFR calc Af Amer: 44 mL/min — ABNORMAL LOW (ref >60)
GFR, EST NON AFRICAN AMERICAN: 38 mL/min — AB (ref >60)
GLUCOSE: 173 mg/dL — AB (ref 65–99)
Potassium: 3.3 mmol/L — ABNORMAL LOW (ref 3.5–5.1)
Sodium: 134 mmol/L — ABNORMAL LOW (ref 135–145)

## 2017-09-26 MED ORDER — AMOXICILLIN-POT CLAVULANATE 875-125 MG PO TABS: 1.0000 | ORAL_TABLET | Freq: Two times a day (BID) | ORAL | 0 refills | Status: AC

## 2017-09-26 MED ORDER — LABETALOL HCL 300 MG PO TABS: 300.0000 mg | ORAL_TABLET | Freq: Two times a day (BID) | ORAL | 1 refills | Status: DC

## 2017-09-26 MED ORDER — POTASSIUM CHLORIDE CRYS ER 20 MEQ PO TBCR
40.0000 meq | EXTENDED_RELEASE_TABLET | Freq: Once | ORAL | Status: AC
  Administered 2017-09-26: 40 meq via ORAL
  Filled 2017-09-26: qty 2

## 2017-09-26 MED ORDER — AMLODIPINE BESYLATE 10 MG PO TABS: 10.0000 mg | ORAL_TABLET | Freq: Every day | ORAL | 0 refills | Status: DC

## 2017-09-26 MED ORDER — FLUTICASONE PROPIONATE 50 MCG/ACT NA SUSP: 2.0000 | Freq: Every day | NASAL | 2 refills | Status: AC

## 2017-09-26 MED ORDER — SENNOSIDES-DOCUSATE SODIUM 8.6-50 MG PO TABS: 1.0000 | ORAL_TABLET | Freq: Every evening | ORAL | 0 refills | Status: AC | PRN

## 2017-09-26 MED ORDER — HYDROCODONE-ACETAMINOPHEN 5-325 MG PO TABS: 1.0000 | ORAL_TABLET | Freq: Four times a day (QID) | ORAL | 0 refills | Status: DC | PRN

## 2017-09-26 MED ORDER — POTASSIUM CHLORIDE ER 20 MEQ PO TBCR: 20.0000 meq | EXTENDED_RELEASE_TABLET | Freq: Every day | ORAL | 0 refills | Status: DC

## 2017-09-26 MED ORDER — ADULT MULTIVITAMIN W/MINERALS CH: 1.0000 | ORAL_TABLET | Freq: Every day | ORAL | 0 refills | Status: DC

## 2017-09-26 NOTE — Plan of Care (Signed)
Family teaching concerning how to assist patient in and out of bed.  Family remains at bedside for support, patient with flat affect.

## 2017-09-26 NOTE — Progress Notes (Signed)
Occupational Therapy Treatment Patient Details Name: Olivia Patel MRN: [id number] DOB: 09/29/1960 Today's Date: 09/26/2017    History of present illness 57-year-old female admitted with  1 week history of generalized weakness, fall on the morning of admission with hypercalcemia, pt found to have breast mass and acute encephalopathy. PMHx: HTN, anemia, arthritis   OT comments  Return for second visit to focus on LB dressing in preparation for dc today. Pt educated on compensatory techniques for LB dressing. Pt performing LB dressing and bathing with Min Guard A for safety in standing. Pt continues to demonstrate poor activity tolerance and requiring rest breaks. Defer to further OT needs to HHOT as planning planning for dc this afternoon. Thank you.    Follow Up Recommendations  Home health OT    Equipment Recommendations  3 in 1 bedside commode    Recommendations for Other Services      Precautions / Restrictions Precautions Precautions: Fall Restrictions Weight Bearing Restrictions: No       Mobility Bed Mobility               General bed mobility comments: Pt in recliner upon arrival  Transfers Overall transfer level: Needs assistance Equipment used: Rolling walker (2 wheeled) Transfers: Sit to/from Stand Sit to Stand: Min guard         General transfer comment: Pt recalling good hand placement. MIn Guard for safety    Balance Overall balance assessment: Needs assistance Sitting-balance support: Feet supported Sitting balance-Leahy Scale: Good     Standing balance support: Bilateral upper extremity supported Standing balance-Leahy Scale: Fair Standing balance comment: Able to maintain static standing without UE support                           ADL either performed or assessed with clinical judgement   ADL Overall ADL's : Needs assistance/impaired             Lower Body Bathing: Min guard;Sit to/from stand Lower Body Bathing  Details (indicate cue type and reason): Pt performing peri care with MIn Guard A for safety in standing Upper Body Dressing : Set up;Supervision/safety Upper Body Dressing Details (indicate cue type and reason): donned shirt Lower Body Dressing: Min guard;Sit to/from stand(Max A for socks) Lower Body Dressing Details (indicate cue type and reason): Educating pt on compensatory techniques for donning underwear and pants. Pt donning pants with Min Guard A for safety in standing. Pt reporting she does not wear socks at home and does not plan on doing so at dc. Pt requiring Max A for doffing socks. Pt donning slip on showers (with backs) with supervision and set up.          Tub/ Shower Transfer: Tub transfer;Min guard;Ambulation;3 in 1;Rolling walker Tub/Shower Transfer Details (indicate cue type and reason): Providing education on safe tub transfer with 3N1 and RW. Pt demonstrating understanding and performing simulated tub transfer with min guard A for safety Functional mobility during ADLs: Rolling walker;Supervision/safety General ADL Comments: Return for second session to focus on LB ADLs and family having brought closes for dc. Providing education on compensaotry techniques. Reviewing all education and answering any questions.      Vision       Perception     Praxis      Cognition Arousal/Alertness: Awake/alert Behavior During Therapy: WFL for tasks assessed/performed Overall Cognitive Status: Within Functional Limits for tasks assessed                                   General Comments: Pt reported initial confusion secondary to hypercalcemia but states that her cognition is back to normal and was A&O x 4        Exercises     Shoulder Instructions       General Comments Sister present throughout session    Pertinent Vitals/ Pain       Pain Assessment: Faces Faces Pain Scale: Hurts a little bit Pain Location: right knee Pain Descriptors / Indicators:  Aching Pain Intervention(s): Monitored during session;Limited activity within patient's tolerance;Repositioned  Home Living                                          Prior Functioning/Environment              Frequency  Min 2X/week        Progress Toward Goals  OT Goals(current goals can now be found in the care plan section)  Progress towards OT goals: Goals met/education completed, patient discharged from OT  Acute Rehab OT Goals Patient Stated Goal: return to independence OT Goal Formulation: With patient/family Time For Goal Achievement: 10/06/17 Potential to Achieve Goals: Good ADL Goals Pt Will Perform Lower Body Bathing: with modified independence;sit to/from stand Pt Will Perform Lower Body Dressing: with modified independence;sit to/from stand Pt Will Transfer to Toilet: with modified independence;ambulating;bedside commode Pt Will Perform Toileting - Clothing Manipulation and hygiene: with modified independence;sit to/from stand Pt Will Perform Tub/Shower Transfer: Tub transfer;with modified independence;ambulating;3 in 1;rolling walker Additional ADL Goal #1: Pt will independently verbally recall 3 energy conservation strategies and utilize during ADL.  Plan Discharge plan remains appropriate;All goals met and education completed, patient discharged from OT services    Co-evaluation                 AM-PAC PT "6 Clicks" Daily Activity     Outcome Measure   Help from another person eating meals?: None Help from another person taking care of personal grooming?: A Little Help from another person toileting, which includes using toliet, bedpan, or urinal?: A Little Help from another person bathing (including washing, rinsing, drying)?: A Little Help from another person to put on and taking off regular upper body clothing?: None Help from another person to put on and taking off regular lower body clothing?: A Little 6 Click Score: 20     End of Session Equipment Utilized During Treatment: Rolling walker  OT Visit Diagnosis: Unsteadiness on feet (R26.81);Muscle weakness (generalized) (M62.81)   Activity Tolerance Patient tolerated treatment well   Patient Left in chair;with call bell/phone within reach;with family/visitor present   Nurse Communication Mobility status        Time: 1317-1329 OT Time Calculation (min): 12 min  Charges: OT General Charges $OT Visit: 1 Visit OT Treatments $Self Care/Home Management : 8-22 mins  Charis Capehart MSOT, OTR/L Acute Rehab Pager: [phone] Office: [phone]   Charis M Capehart 09/26/2017, 2:50 PM    

## 2017-09-26 NOTE — Progress Notes (Signed)
Occupational Therapy Treatment Patient Details Name: Olivia Patel MRN: [id number] DOB: [birth year] Today's Date: 09/26/2017    History of present illness 57 year old female admitted with  1 week history of generalized weakness, fall on the morning of admission with hypercalcemia, pt found to have breast mass and acute encephalopathy. PMHx: HTN, anemia, arthritis   OT comments  Pt progressing towards established OT goals. Pt educated on safe tub transfer techniques with use of 3N1/shower seat. Pt demonstrating understanding and performing simulated tub transfer with Min Guard A for safety. Discussed differences between 3N1 and shower seat for home; providing pt and family with information if wanting to purchase shower seat. Continue to recommend dc home with HHOT and will continue to follow acutely to facilitate safe dc.   Follow Up Recommendations  Home health OT    Equipment Recommendations  3 in 1 bedside commode    Recommendations for Other Services      Precautions / Restrictions Precautions Precautions: Fall Restrictions Weight Bearing Restrictions: No       Mobility Bed Mobility               General bed mobility comments: Pt in recliner upon arrival  Transfers Overall transfer level: Needs assistance Equipment used: Rolling walker (2 wheeled) Transfers: Sit to/from Stand Sit to Stand: Min guard         General transfer comment: cues for hand placement to push from bed and use rail to stand in bathroom    Balance Overall balance assessment: Needs assistance Sitting-balance support: Feet supported Sitting balance-Leahy Scale: Good     Standing balance support: Bilateral upper extremity supported Standing balance-Leahy Scale: Fair Standing balance comment: Able to maintain static standing without UE support                           ADL either performed or assessed with clinical judgement   ADL Overall ADL's : Needs assistance/impaired                                  Tub/ Shower Transfer: Tub transfer;Min guard;Ambulation;3 in 1;Rolling walker Tub/Shower Transfer Details (indicate cue type and reason): Providing education on safe tub transfer with 3N1 and RW. Pt demonstrating understanding and performing simulated tub transfer with min guard A for safety Functional mobility during ADLs: Rolling walker;Supervision/safety General ADL Comments: Pt performing tub transfer with MIn Guard A. Family present to increase safety and carry over to home.      Vision       Perception     Praxis      Cognition Arousal/Alertness: Awake/alert Behavior During Therapy: WFL for tasks assessed/performed Overall Cognitive Status: Within Functional Limits for tasks assessed                                 General Comments: Pt reported initial confusion secondary to hypercalcemia but states that her cognition is back to normal and was A&O x 4        Exercises     Shoulder Instructions       General Comments Sister present throughout session    Pertinent Vitals/ Pain       Pain Assessment: Faces Faces Pain Scale: Hurts a little bit Pain Location: right knee Pain Descriptors / Indicators: Aching Pain Intervention(s): Monitored during session;Limited activity within patient's  tolerance;Repositioned  Home Living                                          Prior Functioning/Environment              Frequency  Min 2X/week        Progress Toward Goals  OT Goals(current goals can now be found in the care plan section)  Progress towards OT goals: Progressing toward goals  Acute Rehab OT Goals Patient Stated Goal: return to independence OT Goal Formulation: With patient/family Time For Goal Achievement: 10/06/17 Potential to Achieve Goals: Good ADL Goals Pt Will Perform Lower Body Bathing: with modified independence;sit to/from stand Pt Will Perform Lower Body Dressing:  with modified independence;sit to/from stand Pt Will Transfer to Toilet: with modified independence;ambulating;bedside commode Pt Will Perform Toileting - Clothing Manipulation and hygiene: with modified independence;sit to/from stand Pt Will Perform Tub/Shower Transfer: Tub transfer;with modified independence;ambulating;3 in 1;rolling walker Additional ADL Goal #1: Pt will independently verbally recall 3 energy conservation strategies and utilize during ADL.  Plan Discharge plan remains appropriate    Co-evaluation                 AM-PAC PT "6 Clicks" Daily Activity     Outcome Measure   Help from another person eating meals?: None Help from another person taking care of personal grooming?: A Little Help from another person toileting, which includes using toliet, bedpan, or urinal?: A Little Help from another person bathing (including washing, rinsing, drying)?: A Little Help from another person to put on and taking off regular upper body clothing?: None Help from another person to put on and taking off regular lower body clothing?: A Little 6 Click Score: 20    End of Session Equipment Utilized During Treatment: Rolling walker  OT Visit Diagnosis: Unsteadiness on feet (R26.81);Muscle weakness (generalized) (M62.81)   Activity Tolerance Patient tolerated treatment well   Patient Left in chair;with call bell/phone within reach;with family/visitor present   Nurse Communication Mobility status        Time: 1209-1227 OT Time Calculation (min): 18 min  Charges: OT General Charges $OT Visit: 1 Visit OT Treatments $Self Care/Home Management : 8-22 mins  [place], OTR/L Acute Rehab Pager: [phone] Office: [place] 09/26/2017, 2:42 PM

## 2017-09-26 NOTE — Care Management Important Message (Signed)
Important Message  Patient Details  Name: Olivia Patel MRN: [id number] Date of Birth: 1960/09/05   Medicare Important Message Given:  Yes    Sherral Dirocco P [place] 09/26/2017, 3:35 PM

## 2017-09-26 NOTE — Progress Notes (Signed)
Discharge note. Patient, sister, and niece educated at bedside. RN educated on medications and when to take them, follow-up appointments, signs and symptoms to look for, when to call the MD, when to seek immediate medical attention, information on antibiotics, information on hypercalcemia, and diet and activity recommendations. PIV removed without complications. All prescriptions sent with family.  Patient discharged by wheelchair with RN.   Basil Dess, RN

## 2017-09-26 NOTE — Care Management Note (Addendum)
Case Management Note  Patient Details  Name: Olivia Patel MRN: [id number] Date of Birth: November 02, 1960  Subjective/Objective:  Pt admitted with severe hypercalcemia - pt now dx with breast CA                  Action/Plan:   PTA independent from home with adult daughters.  Pt has PCP and denied barriers with obtaining/paying for medications.  Pt is in agreement with HHPT, OT and DME. CM gave pt [facility] agency choice list to look over - DME ordered from [facility] - referral accepted.  CM requested orders   Expected Discharge Date:                  Expected Discharge Plan:  [place]  In-House Referral:     Discharge planning Services  CM Consult  Post Acute Care Choice:    Choice offered to:     DME Arranged:  3-N-1, Walker rolling with seat DME Agency:  [place]:    [place]:     Status of Service:     If discussed at [place] of Stay Meetings, dates discussed:    Additional Comments: 09/26/2017 Pt chose [facility] for [facility] - agency accepted referra Maryclare Labrador, RN 09/26/2017, 10:53 AM

## 2017-09-26 NOTE — Discharge Summary (Signed)
[facility]  Physician Discharge Summary   Patient ID: Olivia Patel MRN: [id number] DOB/AGE: 1961-04-20 57 y.o.  Admit date: 09/17/2017 Discharge date: 09/26/2017  PCP: Lance Sell, NP  DISCHARGE DIAGNOSES:  Newly diagnosed breast cancer Hypercalcemia of malignancy Sinus tachycardia  RECOMMENDATIONS FOR OUTPATIENT FOLLOW UP: 1. Patient has an appointment with oncology on 6/4 to discuss treatment options   DISCHARGE CONDITION: fair  Diet recommendation: As before  [facility] Weights   09/24/17 0614 09/25/17 0521 09/26/17 0326  Weight: 87.2 kg (192 lb 3.9 oz) 86.5 kg (190 lb 9.6 oz) 85.9 kg (189 lb 6 oz)    INITIAL HISTORY: 57 year old female with hypertension, anemia, arthritis presented with progressive 1 week history of generalized weakness, fall on the morning of admission. Patient's daughter also reported slight confusion following the fall. Patient had decreased concentration, no stupor, polydipsia, polyuria. Labs showed potassium 2.8, chloride 84, bicarb 37, creatinine 1.85, calcium greater than 15, magnesium 1.4.  Patient was hospitalized for further management.  Patient was found to have a right breast mass.  Underwent biopsy consistent with breast cancer.  Subsequently noted to have sinus tachycardia and leukocytosis.  Consultants: Oncology: Dr. Benay Spice.  Interventional radiology  Procedures:  Biopsy of the breast mass 5/29  Transthoracic echocardiogram Study Conclusions  - Left ventricle: The cavity size was normal. Systolic function was vigorous. The estimated ejection fraction was in the range of 65% to 70%. Wall motion was normal; there were no regional wall motion abnormalities. - Atrial septum: No defect or patent foramen ovale was identified.   HOSPITAL COURSE:   Severe hypercalcemia Hypercalcemia is most likely secondary to malignancy.  Patient found to have a mass in the right breast and is found to have multiple adenopathy  on imaging studies. This is concerning for malignant breast cancer. Patient was given IV fluids and pamidronate. She also received Lasix. She was given 4 doses of calcitonin.  Calcium level did improve.    However calcium started rising again so she was given dose of Zometa on 6/1.  Calcium level has improved.     Breast cancer Found to have a mass in the right breast.  Patient underwent breast biopsy.  Pathology does suggest breast cancer.  She will be seen by oncology tomorrow to discuss treatment options.  MRI brain negative for brain metastases. ? Hypokalemia and hypomagnesemia Electrolytes have been supplemented.  She will be given prescription for potassium.  Acute kidney injury Thought to be secondary to severe dehydration and hypercalcemia and ATN. Nephrology was consulted. Patient started on IV fluids.  Renal function has improved significantly.    Sinus tachycardia Patient noted to have sinus tachycardia.  EKG shows the same without any other acute changes.  Patient is asymptomatic.  TSH 6.08.  Free T4 normal at 0.84.  Echocardiogram showed normal systolic function.  No concerning abnormalities noted otherwise.  Right ventricular function was also normal.  Etiology for the sinus tachycardia is not entirely clear but could be due to metabolic derangements, deconditioning, pain issues or malignancy.    Heart rate has improved.  Acute respiratory failure with hypoxia and sepsis/community-acquired versus aspiration pneumonia On 5/26 patient had tachypnea altered mental status and fevers. Procalcitonin 0.5. Chest x-ray showed bilateral pleural effusion and bibasilar hazy atelectasis versus airspace disease. Patient was started on IV vancomycin and Zosyn.  Cultures were negative.  Vancomycin was discontinued.  Zosyn was changed over to Augmentin.  Respiratory status is stable.  Saturating normal on room air.  Chest x-ray was  repeated on 6/1 and did not show any concerning changes.  She will be  prescribed Augmentin on discharge.  Leukocytosis Elevated WBC was noted.  She is afebrile.  No clear source of her new infection.  UA unremarkable.  Patient denies any dysuria.  Labs are stable this morning.  Patient denies any diarrhea.    Outpatient monitoring.  Normocytic anemia Hemoglobin has been stable.  No evidence for overt bleeding.  Edema involving upper and lower extremities Likely multifactorial including hypoalbuminemia, IV fluids.  Improve nutritional status.   Edema has improved.  Acute metabolic encephalopathy Likely secondary to hypercalcemia. MRI brain negative for any metastatic process.  Mental status appears to be back to baseline.    Essential hypertension Pressure was poorly controlled.  Dose of labetalol has been increased.  Dose of amlodipine to be increased as well.  Overall stable.  Patient wishes to go home today.  Cleared for discharge.     PERTINENT LABS:  The results of significant diagnostics from this hospitalization (including imaging, microbiology, ancillary and laboratory) are listed below for reference.    Microbiology: Recent Results (from the past 240 hour(s))  Culture, blood (Routine X 2) w Reflex to ID Panel     Status: None   Collection Time: 09/18/17  1:31 PM  Result Value Ref Range Status   Specimen Description BLOOD RIGHT HAND  Final   Special Requests   Final    BOTTLES DRAWN AEROBIC AND ANAEROBIC Blood Culture adequate volume   Culture   Final    NO GROWTH 5 DAYS Performed at [facility], [address] [place], [place] [address]    Report Status 09/23/2017 FINAL  Final  Culture, blood (Routine X 2) w Reflex to ID Panel     Status: None   Collection Time: 09/18/17  1:31 PM  Result Value Ref Range Status   Specimen Description BLOOD RIGHT WRIST  Final   Special Requests   Final    BOTTLES DRAWN AEROBIC AND ANAEROBIC Blood Culture adequate volume   Culture   Final    NO GROWTH 5 DAYS Performed at [facility], [place] [address] [place], [place] [address]    Report Status 09/23/2017 FINAL  Final  MRSA PCR Screening     Status: None   Collection Time: 09/20/17  1:58 PM  Result Value Ref Range Status   MRSA by PCR NEGATIVE NEGATIVE Final    Comment:        The GeneXpert MRSA Assay (FDA approved for NASAL specimens only), is one component of a comprehensive MRSA colonization surveillance program. It is not intended to diagnose MRSA infection nor to guide or monitor treatment for MRSA infections. Performed at [facility], [place] [address], [place], [place] [address]      Labs: Basic Metabolic Panel: Recent Labs  Lab 09/21/17 0405 09/22/17 0304 09/23/17 0258 09/24/17 0345 09/24/17 0934 09/25/17 0246 09/26/17 0237  NA 138 141 136 134*  --  134* 134*  K 2.8* 3.6 3.3* 3.6  --  3.3* 3.3*  CL 102 107 107 101  --  100* 99*  CO2 24 23 21* 22  --  24 25  GLUCOSE 158* 159* 223* 176*  --  182* 173*  BUN 41* 30* 26* 20  --  19 26*  CREATININE 2.20* 1.97* 1.78* 1.57*  --  1.52* 1.51*  CALCIUM 10.6* 10.9* 10.8* 11.7*  --  12.1* 11.4*  MG 1.3*  --   --   --  1.2* 1.7  --    Liver Function Tests: Recent Labs  Lab 09/20/17 0400 09/21/17 0405  AST 30 33  ALT 25 25  ALKPHOS 90 95  BILITOT 1.2 1.1  PROT 6.2* 5.9*  ALBUMIN 2.3* 2.3*   CBC: Recent Labs  Lab 09/21/17 0405 09/22/17 0304 09/24/17 0345 09/25/17 0752 09/26/17 0237  WBC 15.0* 16.2* 22.3* 22.3* 21.8*  NEUTROABS  --   --   --  18.9*  --   HGB 9.0* 9.0* 8.5* 8.4* 8.1*  HCT 27.4* 28.0* 26.1* 25.7* 24.8*  MCV 84.8 85.1 85.3 84.3 86.1  PLT 286 274 259 257 261    IMAGING STUDIES Ct Abdomen Pelvis Wo Contrast  Result Date: 09/19/2017 CLINICAL DATA:  Hypercalcemia, scattered lytic bone lesions. Concern for breast cancer. EXAM: CT CHEST, ABDOMEN AND PELVIS WITHOUT CONTRAST TECHNIQUE: Multidetector CT imaging of the chest, abdomen and pelvis was performed following the standard protocol without IV contrast.  COMPARISON:  09/17/2017 FINDINGS: CT CHEST FINDINGS Cardiovascular: Unremarkable Mediastinum/Nodes: Mediastinal and quite likely bilateral hilar adenopathy noted with a lower right paratracheal lymph node measuring 2.3 cm in short axis on image 21/4. Right hilar and subcarinal adenopathy observed. In individual right axillary node measures 1.9 cm in short axis on image 24/4. Lungs/Pleura: 0.6 by 0.5 cm left upper lobe pulmonary nodule, image 69/6. There bands of atelectasis in both lower lobes. Small bilateral pleural effusions. Musculoskeletal: Infiltrative masslike appearance in the right breast. One solid component superiorly measures 4.1 by 4.2 cm on image 24/4. There is skin thickening in the right breast. Widespread lytic osseous metastatic disease is present in all visualized bony structures. An index lytic lesion of the right lower sternum measures 1.9 by 1.4 by 2.7 cm. No current pathologic vertebral collapse. There pathologic fractures of the left third, fifth, seventh, eighth, and ninth ribs; and pathologic fractures of the right third, seventh, eighth, and tenth ribs. CT ABDOMEN PELVIS FINDINGS Hepatobiliary: Unremarkable noncontrast CT appearance. Pancreas: Unremarkable Spleen: Unremarkable Adrenals/Urinary Tract: Unremarkable Stomach/Bowel: Unremarkable Vascular/Lymphatic: Unremarkable Reproductive: Calcified uterine fibroids. Other: No supplemental non-categorized findings. Musculoskeletal: Widespread lytic metastatic involvement of visualized abdomen and pelvic skeletal structures as well as the proximal femurs. IMPRESSION: 1. Right breast mass with associated cutaneous thickening, thoracic adenopathy, axillary and subpectoral adenopathy, and widespread lytic osseous metastatic disease involving virtually all bony structures seen on today's exam. 2. Pathologic rib fractures bilaterally as detailed above. 3. 6 mm left upper lobe pulmonary nodule, nonspecific. 4. Small bilateral pleural effusions,  nonspecific for transudative or exudative/malignant etiology. There are bands of atelectasis in both lower lobes. 5. Calcified uterine fibroids. Electronically Signed   By: Van Clines M.D.   On: 09/19/2017 12:04   Dg Pelvis 1-2 Views  Result Date: 09/23/2017 CLINICAL DATA:  Right hip pain since 2014. Multiple lytic bone metastases on a recent chest, abdomen and pelvis CT. EXAM: PELVIS - 1-2 VIEW COMPARISON:  None. FINDINGS: Moderate right femoral head and neck junction spur formation with mild joint space narrowing. Mild to moderate left femoral head and neck junction spur formation. Previously demonstrated calcified uterine fibroids. IMPRESSION: Bilateral hip degenerative changes, greater on the right. Electronically Signed   By: Claudie Revering M.D.   On: 09/23/2017 12:37   Ct Head Wo Contrast  Addendum Date: 09/18/2017   ADDENDUM REPORT: 09/18/2017 21:45 ADDENDUM: Bone windows demonstrate multiple tiny lucencies throughout the skull without expansile change. Lucent lesions with mild expansion demonstrated in the skull base involving the occipital condyles bilaterally. This is greater on  the left. Destructive bone lesion with a raised cortical flap involving the right pedicle and right anterior arch of the C1 vertebra. Small lucent lesion demonstrated in the odontoid process. Destructive expansile lesion in the left sphenoid bone. IMPRESSION: Multiple lucent lesions demonstrated in the calvarium, skull base, and in the visualized C1 and C2 vertebrae. Changes likely to represent multiple myeloma or lucent bone metastases. Electronically Signed   By: Lucienne Capers M.D.   On: 09/18/2017 21:45   Result Date: 09/18/2017 CLINICAL DATA:  Syncope EXAM: CT HEAD WITHOUT CONTRAST TECHNIQUE: Contiguous axial images were obtained from the base of the skull through the vertex without intravenous contrast. COMPARISON:  None. FINDINGS: Brain: There is low-density in the periventricular white matter, adjacent to  the frontal horns, in the left frontal lobe subcortical white matter, as well as the bilateral parietal lobes. No mass effect, midline shift, or acute hemorrhage. Ventricular system is within normal limits. Vascular: No hyperdense vessel or unexpected calcification. Skull: Cranium is intact. Sinuses/Orbits: There is mucous material in the right maxillary sinus. Mastoid air cells and remainder of the visualized paranasal sinuses are clear. Orbits are within normal limits. Other: Noncontributory IMPRESSION: There is patchy low density in the supratentorial white matter most likely related to chronic ischemic changes of small vessel disease. No acute intracranial pathology. Electronically Signed: By: Marybelle Killings M.D. On: 09/17/2017 12:48   Ct Soft Tissue Neck Wo Contrast  Result Date: 09/19/2017 CLINICAL DATA:  Breast mass, lytic bone lesions. EXAM: CT NECK WITHOUT CONTRAST TECHNIQUE: Multidetector CT imaging of the neck was performed following the standard protocol without intravenous contrast. COMPARISON:  Radiographs from 09/17/2017 FINDINGS: Pharynx and larynx: Mild circumferential prominence of Waldeyer's ring, likely incidental. Salivary glands: Unremarkable Thyroid: Unremarkable Lymph nodes: Right level II a lymph node upper normal size at 0.9 cm on image 33/4. Right level IV lymph node pathologically enlarged at 1.4 cm in diameter on image 63/4. Right upper subpectoral adenopathy is observed. Vascular: No significant atherosclerotic calcification. Limited intracranial: Unremarkable where included. Visualized orbits: Unremarkable where included. Mastoids and visualized paranasal sinuses: Chronic right maxillary sinusitis. Periapical lucency and cavity associated with tooth # 14. Skeleton: Lytic metastatic disease anteriorly in the left temporal bone near the orbit on image 3/4; in the left occipital condyle; in the right lateral mass of C2; and encompassing much of the C5 vertebral body. There are lytic  lesions at all vertebral levels and a notable lytic lesion involving the sternal manubrium. Upper chest: Deferred to CT chest from same day. Other: No supplemental non-categorized findings. IMPRESSION: 1. Widespread lytic osseous metastatic disease with the largest lesions in the neck region along the left occipital condyle, the right lateral mass of C2, and in the C5 vertebral body. All visualized bony structures possibly with the exception of the mandible appear involved. 2. Right level IV adenopathy just above the supraclavicular region, lymph node 1.4 cm in short axis. There is also right upper subpectoral adenopathy and known mediastinal adenopathy. 3. Periapical lucency and cavity associated with tooth # 14. 4. Chronic right maxillary sinusitis. Electronically Signed   By: Van Clines M.D.   On: 09/19/2017 12:18   Ct Chest Wo Contrast  Result Date: 09/19/2017 CLINICAL DATA:  Hypercalcemia, scattered lytic bone lesions. Concern for breast cancer. EXAM: CT CHEST, ABDOMEN AND PELVIS WITHOUT CONTRAST TECHNIQUE: Multidetector CT imaging of the chest, abdomen and pelvis was performed following the standard protocol without IV contrast. COMPARISON:  09/17/2017 FINDINGS: CT CHEST FINDINGS Cardiovascular: Unremarkable Mediastinum/Nodes: Mediastinal  and quite likely bilateral hilar adenopathy noted with a lower right paratracheal lymph node measuring 2.3 cm in short axis on image 21/4. Right hilar and subcarinal adenopathy observed. In individual right axillary node measures 1.9 cm in short axis on image 24/4. Lungs/Pleura: 0.6 by 0.5 cm left upper lobe pulmonary nodule, image 69/6. There bands of atelectasis in both lower lobes. Small bilateral pleural effusions. Musculoskeletal: Infiltrative masslike appearance in the right breast. One solid component superiorly measures 4.1 by 4.2 cm on image 24/4. There is skin thickening in the right breast. Widespread lytic osseous metastatic disease is present in all  visualized bony structures. An index lytic lesion of the right lower sternum measures 1.9 by 1.4 by 2.7 cm. No current pathologic vertebral collapse. There pathologic fractures of the left third, fifth, seventh, eighth, and ninth ribs; and pathologic fractures of the right third, seventh, eighth, and tenth ribs. CT ABDOMEN PELVIS FINDINGS Hepatobiliary: Unremarkable noncontrast CT appearance. Pancreas: Unremarkable Spleen: Unremarkable Adrenals/Urinary Tract: Unremarkable Stomach/Bowel: Unremarkable Vascular/Lymphatic: Unremarkable Reproductive: Calcified uterine fibroids. Other: No supplemental non-categorized findings. Musculoskeletal: Widespread lytic metastatic involvement of visualized abdomen and pelvic skeletal structures as well as the proximal femurs. IMPRESSION: 1. Right breast mass with associated cutaneous thickening, thoracic adenopathy, axillary and subpectoral adenopathy, and widespread lytic osseous metastatic disease involving virtually all bony structures seen on today's exam. 2. Pathologic rib fractures bilaterally as detailed above. 3. 6 mm left upper lobe pulmonary nodule, nonspecific. 4. Small bilateral pleural effusions, nonspecific for transudative or exudative/malignant etiology. There are bands of atelectasis in both lower lobes. 5. Calcified uterine fibroids. Electronically Signed   By: Van Clines M.D.   On: 09/19/2017 12:04   Mr Brain Wo Contrast  Result Date: 09/19/2017 CLINICAL DATA:  Unexplained alteration of consciousness. Physical exam as well as cross-sectional imaging findings concerning for metastatic breast cancer. EXAM: MRI HEAD WITHOUT CONTRAST TECHNIQUE: Multiplanar, multiecho pulse sequences of the brain and surrounding structures were obtained without intravenous contrast. COMPARISON:  CT neck without contrast earlier today. CT head 09/17/2017. FINDINGS: Brain: No acute stroke, acute hemorrhage, intra-axial mass lesion, hydrocephalus, or extra-axial fluid. Mild  atrophy. Extensive T2 and FLAIR hyperintensities throughout the white matter, more focal and confluent, favored to represent chronic microvascular ischemic change. No definite intracranial metastatic disease. While this would be optimally evaluated with MultiHance, the patient cannot have gadolinium due to renal failure. Vascular: Flow voids are maintained. Skull and upper cervical spine: There are multiple metastatic lesions in the calvarium, better demonstrated on CT due to their small size. There is an aggressive osseous metastasis of C1 on the RIGHT. Sinuses/Orbits: RIGHT maxillary sinus layering fluid suggesting acuity. Otherwise clear. Negative orbits. Other: None. IMPRESSION: MRI brain demonstrating no definite brain metastases, acute stroke, or other significant finding. Widespread white matter lesions, likely chronic microvascular ischemic change. Widespread osseous metastatic disease, most notably affecting C1 on the RIGHT but also the calvarium. No spinal cord compression or intracranial mass lesion Electronically Signed   By: Staci Righter M.D.   On: 09/19/2017 16:47   Dg Chest Port 1 View  Result Date: 09/24/2017 CLINICAL DATA:  Cough EXAM: PORTABLE CHEST 1 VIEW COMPARISON:  09/18/2017 FINDINGS: Heart size is normal. Mediastinal shadows are normal. The right chest appears clear. There is mild blunting of the costophrenic angle on the left consistent with a small left effusion. IMPRESSION: The only abnormal chest radiographic finding is blunting of the costophrenic angle on the left consistent with a small left effusion. Electronically Signed   By:  Nelson Chimes M.D.   On: 09/24/2017 09:22   Dg Chest Port 1 View  Result Date: 09/18/2017 CLINICAL DATA:  Acute respiratory distress EXAM: PORTABLE CHEST 1 VIEW COMPARISON:  09/17/2017 FINDINGS: Normal heart size. Lungs are under aerated. Small pleural effusions and bibasilar hazy atelectasis versus airspace disease. No pneumothorax. IMPRESSION: Small  pleural effusions. Bibasilar hazy atelectasis versus airspace disease. Electronically Signed   By: Marybelle Killings M.D.   On: 09/18/2017 12:56   Dg Bone Survey Met  Addendum Date: 09/18/2017   ADDENDUM REPORT: 09/18/2017 22:44 ADDENDUM: Addendum report completed. Electronically Signed   By: Lucienne Capers M.D.   On: 09/18/2017 22:44   Addendum Date: 09/18/2017   ADDENDUM REPORT: 09/18/2017 21:42 ADDENDUM: Additional lesions are identified as follows: Pathologic fracture of the left anterior third rib. Focal lucent lesion in the left scapula at the glenoid neck. Small permeative lucent lesions in the left symphysis pubis and left inferior pubic ramus. Moderately severe degenerative changes in both hips. Lucent lesions in the femoral heads may represent degenerative cysts or destructive bone lesions. Calcification in the pelvis consistent with fibroid. Electronically Signed   By: Lucienne Capers M.D.   On: 09/18/2017 21:42   Result Date: 09/18/2017 CLINICAL DATA:  Hypercalcemia. EXAM: METASTATIC BONE SURVEY COMPARISON:  None. FINDINGS: Multiple rounded lucencies are noted in the skull concerning for lytic lesions. Mildly displaced third right rib fracture is noted. Lytic lesion is seen involving the lateral portion of the right seventh rib. Probable pathologic fracture is seen involving the lateral portion of the left seventh rib. No other abnormality is seen in the visualized skeleton. IMPRESSION: Multiple rounded lucencies are noted in the skull concerning for lytic lesions related to multiple myeloma. Also noted are bilateral lytic lesions in the ribs. Electronically Signed: By: Marijo Conception, M.D. On: 09/17/2017 15:08   Dg Femur, Min 2 Views Right  Result Date: 09/23/2017 CLINICAL DATA:  Right femur pain.  No acute trauma. EXAM: RIGHT FEMUR 2 VIEWS COMPARISON:  None. FINDINGS: Degenerative changes are noted at the right hip and the right knee. There is no significant effusion. No acute or healing  fractures are present. Degenerative changes of the knee are most prominent in the patellofemoral and medial component. A 4.5 cm calcified fibroid is stable. IMPRESSION: 1. Degenerative changes are present at the right hip and right knee. 2. No acute abnormality. Electronically Signed   By: San Morelle M.D.   On: 09/23/2017 12:43    DISCHARGE EXAMINATION: Vitals:   09/26/17 0700 09/26/17 0836 09/26/17 0944 09/26/17 1100  BP: 139/77   (!) 157/80  Pulse: 100 (!) 120 (!) 108 (!) 103  Resp: 19     Temp: 98.2 F (36.8 C)   99.1 F (37.3 C)  TempSrc: Oral   Oral  SpO2: 96% 92%  97%  Weight:      Height:       General appearance: alert, cooperative, appears stated age and no distress Resp: clear to auscultation bilaterally Cardio: History is mildly tachycardic.  Regular.  No S3-S4.  Telemetry shows improvement in heart rate. GI: soft, non-tender; bowel sounds normal; no masses,  no organomegaly Extremities: extremities normal, atraumatic, no cyanosis or edema  DISPOSITION: Home with sister  Discharge Instructions    Call MD for:  difficulty breathing, headache or visual disturbances   Complete by:  As directed    Call MD for:  extreme fatigue   Complete by:  As directed    Call MD  for:  persistant dizziness or light-headedness   Complete by:  As directed    Call MD for:  persistant nausea and vomiting   Complete by:  As directed    Call MD for:  severe uncontrolled pain   Complete by:  As directed    Call MD for:  temperature >100.4   Complete by:  As directed    Diet - low sodium heart healthy   Complete by:  As directed    Discharge instructions   Complete by:  As directed    Please take your medications as prescribed.  Follow-up with oncology tomorrow to go over treatment options.  You were cared for by a hospitalist during your hospital stay. If you have any questions about your discharge medications or the care you received while you were in the hospital after you  are discharged, you can call the unit and asked to speak with the hospitalist on call if the hospitalist that took care of you is not available. Once you are discharged, your primary care physician will handle any further medical issues. Please note that NO REFILLS for any discharge medications will be authorized once you are discharged, as it is imperative that you return to your primary care physician (or establish a relationship with a primary care physician if you do not have one) for your aftercare needs so that they can reassess your need for medications and monitor your lab values. If you do not have a primary care physician, you can call [phone] for a physician referral.   Increase activity slowly   Complete by:  As directed         Allergies as of 09/26/2017   No Known Allergies     Medication List    STOP taking these medications   diclofenac 75 MG EC tablet Commonly known as:  VOLTAREN   hydrochlorothiazide 25 MG tablet Commonly known as:  HYDRODIURIL   methocarbamol 500 MG tablet Commonly known as:  ROBAXIN   predniSONE 50 MG tablet Commonly known as:  DELTASONE     TAKE these medications   amLODipine 10 MG tablet Commonly known as:  NORVASC Take 1 tablet (10 mg total) by mouth daily. What changed:    medication strength  how much to take   amoxicillin-clavulanate 875-125 MG tablet Commonly known as:  AUGMENTIN Take 1 tablet by mouth every 12 (twelve) hours for 5 days.   fluticasone 50 MCG/ACT nasal spray Commonly known as:  FLONASE Place 2 sprays into both nostrils daily.   HYDROcodone-acetaminophen 5-325 MG tablet Commonly known as:  NORCO/VICODIN Take 1 tablet by mouth every 6 (six) hours as needed for severe pain.   labetalol 300 MG tablet Commonly known as:  NORMODYNE Take 1 tablet (300 mg total) by mouth 2 (two) times daily. What changed:    medication strength  how much to take  additional instructions   multivitamin with minerals Tabs  tablet Take 1 tablet by mouth daily.   Potassium Chloride ER 20 MEQ Tbcr Take 20 mEq by mouth daily.   senna-docusate 8.6-50 MG tablet Commonly known as:  Senokot-S Take 1 tablet by mouth at bedtime as needed for mild constipation.            Durable Medical Equipment  (From admission, onward)        Start     Ordered   09/23/17 1628  For home use only DME 4 wheeled rolling walker with seat  Once    Question:  Patient needs a walker to treat with the following condition  Answer:  Mobility impaired   09/23/17 1628   09/23/17 1628  For home use only DME 3 n 1  Once     09/23/17 1628       Follow-up Information    Ladell Pier, MD Follow up.   Specialty:  Oncology Why:  on 09/27/17 at [facility] information: [place] [address] [phone]        [facility] Follow up.   Specialty:  Home Health Services Why:  Physical therapy Contact information: [place] [address] [employer] Follow up.   Why:  bedside commode, rolling walker Contact information: [place] [phone]           TOTAL DISCHARGE TIME: 35 mins  [facility] Pager [phone]  09/26/2017, 2:29 PM

## 2017-09-26 NOTE — Progress Notes (Signed)
Physical Therapy Treatment Patient Details Name: Olivia Patel MRN: [id number] DOB: [birth year] Today's Date: 09/26/2017    History of Present Illness 56 year old female admitted with  1 week history of generalized weakness, fall on the morning of admission with hypercalcemia, pt found to have breast mass and acute encephalopathy. PMHx: HTN, anemia, arthritis    PT Comments    Pt tolerated treatment well today and performed mobility at improved level as pt ambulated 100 ft min guard. Pt still demonstrates some difficulty with bed mobility and ADLs such as donning socks but has adequate home assistance for D/C home. Med [employer] given; Med [facility] Access Code: [email or web address]   Follow Up Recommendations  Home health PT;Supervision for mobility/OOB     Equipment Recommendations  Rolling walker with 5" wheels;3in1 (PT)(rollator)    Recommendations for Other Services       Precautions / Restrictions Precautions Precautions: Fall    Mobility  Bed Mobility Overal bed mobility: Needs Assistance Bed Mobility: Supine to Sit     Supine to sit: Supervision     General bed mobility comments: HOB 20 degrees with rail and increased time. Pt able to don L sock when sitting EOB with increased time but unable to don R sock due to inability to hold RLE in figure 4 position or adequately flex trunk.  Transfers Overall transfer level: Needs assistance Equipment used: Rolling walker (2 wheeled) Transfers: Sit to/from Stand Sit to Stand: Min guard         General transfer comment: cues for hand placement to push from bed and use rail to stand in bathroom  Ambulation/Gait Ambulation/Gait assistance: Min guard Ambulation Distance (Feet): 100 Feet Assistive device: Rolling walker (2 wheeled) Gait Pattern/deviations: Step-through pattern;Decreased stride length;Trunk flexed Gait velocity: Decreased Gait velocity interpretation: <1.8 ft/sec, indicate of risk for recurrent falls General Gait  Details:  slowly with cues to step into RW. Pt walked 15' to toilet then 100'. Pt requested to return to room after 50 ft ambulation due to fatigue and "congestion." SpO2% WNL during ambulation   Stairs             Wheelchair Mobility    Modified Rankin (Stroke Patients Only)       Balance Overall balance assessment: Needs assistance   Sitting balance-Leahy Scale: Good     Standing balance support: Bilateral upper extremity supported Standing balance-Leahy Scale: Poor Standing balance comment: RW for support throughout                            Cognition Arousal/Alertness: Awake/alert Behavior During Therapy: WFL for tasks assessed/performed Overall Cognitive Status: Within Functional Limits for tasks assessed                                        Exercises General Exercises - Lower Extremity Ankle Circles/Pumps: AROM;10 reps;Seated;Both Long Arc Quad: AROM;10 reps;Seated;Both Hip Flexion/Marching: AROM;10 reps;Seated;Both Other Exercises Other Exercises: Access Code: [email or web address]     General Comments        Pertinent Vitals/Pain Faces Pain Scale: Hurts a little bit Pain Location: right knee Pain Descriptors / Indicators: Aching Pain Intervention(s): Limited activity within patient's tolerance;Repositioned    Home Living                      Prior Function  PT Goals (current goals can now be found in the care plan section) Progress towards PT goals: Progressing toward goals    Frequency           PT Plan Current plan remains appropriate    Co-evaluation              AM-PAC PT "6 Clicks" Daily Activity  Outcome Measure  Difficulty turning over in bed (including adjusting bedclothes, sheets and blankets)?: A Little Difficulty moving from lying on back to sitting on the side of the bed? : A Lot Difficulty sitting down on and standing up from a chair with arms (e.g., wheelchair, bedside  commode, etc,.)?: A Little Help needed moving to and from a bed to chair (including a wheelchair)?: A Little Help needed walking in hospital room?: A Little Help needed climbing 3-5 steps with a railing? : A Little 6 Click Score: 17    End of Session   Activity Tolerance: Patient tolerated treatment well Patient left: in chair;with call bell/phone within reach;with chair alarm set;with family/visitor present Nurse Communication: Mobility status PT Visit Diagnosis: Other abnormalities of gait and mobility (R26.89);Muscle weakness (generalized) (M62.81)     Time: 6073-7106 PT Time Calculation (min) (ACUTE ONLY): 29 min  Charges:  $Gait Training: 8-22 mins $Therapeutic Exercise: 8-22 mins                    G Codes:       Gabe Revin Corker, SPT   [employer] 09/26/2017, 9:23 AM

## 2017-09-26 NOTE — Discharge Instructions (Signed)
Hypercalcemia Hypercalcemia is having too much calcium in the blood. The body needs calcium to make bones and keep them strong. Calcium also helps the muscles, nerves, brain, and heart work the way they should. Most of the calcium in the body is in the bones. There is also some calcium in the blood. Hypercalcemia can happen when calcium comes out of the bones, or when the kidneys are not able to remove calcium from the blood. Hypercalcemia can be mild or severe. What are the causes? There are many possible causes of hypercalcemia. Common causes include:  Hyperparathyroidism. This is a condition in which the body produces too much parathyroid hormone. There are four parathyroid glands in your neck. These glands produce a chemical messenger (hormone) that helps the body absorb calcium from foods and helps your bones release calcium.  Certain kinds of cancer, such as lung cancer, breast cancer, or myeloma.  Less common causes of hypercalcemia include:  Getting too much calcium or vitamin D from your diet.  Kidney failure.  Hyperthyroidism.  Being on bed rest for a long time.  Certain medicines.  Infections.  Sarcoidosis.  What increases the risk? This condition is more likely to develop in:  Women.  People who are 60 years or older.  People who have a family history of hypercalcemia.  What are the signs or symptoms? Mild hypercalcemia that starts slowly may not cause symptoms. Severe, sudden hypercalcemia is more likely to cause symptoms, such as:  Loss of appetite.  Increased thirst and frequent urination.  Fatigue.  Nausea and vomiting.  Headache.  Abdominal pain.  Muscle pain, twitching, or weakness.  Constipation.  Blood in the urine.  Pain in the side of the back (flank pain).  Anxiety, confusion, or depression.  Irregular heartbeat (arrhythmia).  Loss of consciousness.  How is this diagnosed? This condition may be diagnosed based on:  Your  symptoms.  Blood tests.  Urine tests.  X-rays.  Ultrasound.  MRI.  CT scan.  How is this treated? Treatment for hypercalcemia depends on the cause. Treatment may include:  Receiving fluids through an IV tube.  Medicines that keep calcium levels steady after receiving fluids (loop diuretics).  Medicines that keep calcium in your bones (bisphosphonates).  Medicines that lower the calcium level in your blood.  Surgery to remove overactive parathyroid glands.  Follow these instructions at home:  Take over-the-counter and prescription medicines only as told by your health care provider.  Follow instructions from your health care provider about eating or drinking restrictions.  Drink enough fluid to keep your urine clear or pale yellow.  Stay active. Weight-bearing exercise helps to keep calcium in your bones. Follow instructions from your health care provider about what type and level of exercise is safe for you.  Keep all follow-up visits as told by your health care provider. This is important. Contact a health care provider if:  You have a fever.  You have flank or abdominal pain that is getting worse. Get help right away if:  You have severe abdominal or flank pain.  You have chest pain.  You have trouble breathing.  You become very confused and sleepy.  You lose consciousness. This information is not intended to replace advice given to you by your health care provider. Make sure you discuss any questions you have with your health care provider. Document Released: 06/26/2004 Document Revised: 09/18/2015 Document Reviewed: 08/28/2014 Elsevier Interactive Patient Education  2018 [employer].

## 2017-09-26 NOTE — Progress Notes (Signed)
IP PROGRESS NOTE  Subjective:   Olivia Patel reports nasal congestion last night.  No other complaint.  No dyspnea or chest pain.  She wants to go home.  She is eating.  Objective: Vital signs in last 24 hours: Blood pressure (!) 155/70, pulse 98, temperature 98.8 F (37.1 C), temperature source Oral, resp. rate 16, height '5\' 3"'$  (1.6 m), weight 189 lb 6 oz (85.9 kg), last menstrual period 05/29/2015, SpO2 95 %.  Intake/Output from previous day: 06/02 0701 - 06/03 0700 In: 890 [P.O.:890] Out: 1400 [Urine:1400]  Physical Exam: HEENT: No thrush Cardiac: Regular rate and rhythm Lungs: Decreased breath sounds with inspiratory rhonchi/rub at the lower posterior chest bilaterally, no respiratory distress Vascular: No leg edema Gastrointestinal: Abdomen soft and nontender    Lab Results: Recent Labs    09/25/17 0752 09/26/17 0237  WBC 22.3* 21.8*  HGB 8.4* 8.1*  HCT 25.7* 24.8*  PLT 257 261    BMET Recent Labs    09/25/17 0246 09/26/17 0237  NA 134* 134*  K 3.3* 3.3*  CL 100* 99*  CO2 24 25  GLUCOSE 182* 173*  BUN 19 26*  CREATININE 1.52* 1.51*  CALCIUM 12.1* 11.4*     Medications: I have reviewed the patient's current medications.  Assessment/Plan: 1.  Metastatic breast cancer  Large right breast mass with diffuse skin thickening  CTs 09/19/2017 with mediastinal/hilar adenopathy, subcarinal adenopathy, widespread bone lesions, right breast mass with skin thickening  MRI of the brain 09/19/2017-negative for brain metastases  Ultrasound-guided biopsy of the right breast mass on 09/21/2017, invasive mammary carcinoma, HER-2 negative 2.  Hypercalcemia, status post pamidronate 09/17/2017, calcitonin initiated 09/18/2017, Zometa 09/24/2017 3.  Dehydration secondary to hypercalcemia, resolved 4.  Lytic bone lesions, rib fractures secondary to #1 5.  Hypertension 6.  Renal failure-?  Secondary to ATN, improving 7.  Fever-cultures negative, no apparent source for infection,  tumor fever? 8.  Anemia secondary to metastatic breast cancer and phlebotomy  Olivia Patel appears well.  The calcium is lower today.  She appears stable for discharge to home today.  I contacted pathology again this morning.  The ER/PR result is pending and should be available by the a.m. on 09/27/2017.  The plan is to initiate aromatase inhibitors/CDK therapy if the tumor returns hormone receptor positive.  If she has a triple negative tumor we will begin systemic chemotherapy this week.    Recommendations: 1.  Okay for discharge to home from an oncology standpoint, follow-up as scheduled at the Cancer center on 09/27/2017      LOS: 9 days   Betsy Coder, MD   09/26/2017, 6:47 AM

## 2017-09-26 NOTE — Telephone Encounter (Signed)
FYI  Call tonight from service regarding Olivia Patel.  She was discharged today from the hospital.  Admitted currently for electrolyte imbalance, but ultimately was diagnosed with stage IV breast cancer.  She called because she is having a temperature of 100.8.  She was unsure if she needed to go back to the hospital.  She does have a mild cough.  Looks like she was discharged home on Augmentin.  Imaging and blood work reviewed.  She does have an appointment with oncology at 11 AM tomorrow.  Advised continuing the antibiotic and taking Tylenol overnight.  We will try to keep her out of the hospital since she was discharged home today and does have a follow-up appointment with oncology tomorrow.  Nurse advised her that if she has any worsening of any symptoms or any concerning symptoms overnight then she will need to go to the emergency room.

## 2017-09-27 ENCOUNTER — Inpatient Hospital Stay: Payer: Medicare Other

## 2017-09-27 ENCOUNTER — Inpatient Hospital Stay: Payer: Medicare Other | Attending: Oncology | Admitting: Oncology

## 2017-09-27 DIAGNOSIS — I10 Essential (primary) hypertension: Secondary | ICD-10-CM | POA: Diagnosis not present

## 2017-09-27 DIAGNOSIS — Z7189 Other specified counseling: Secondary | ICD-10-CM

## 2017-09-27 DIAGNOSIS — Z171 Estrogen receptor negative status [ER-]: Secondary | ICD-10-CM | POA: Insufficient documentation

## 2017-09-27 DIAGNOSIS — D630 Anemia in neoplastic disease: Secondary | ICD-10-CM | POA: Diagnosis not present

## 2017-09-27 DIAGNOSIS — Z5111 Encounter for antineoplastic chemotherapy: Secondary | ICD-10-CM | POA: Diagnosis not present

## 2017-09-27 DIAGNOSIS — C50811 Malignant neoplasm of overlapping sites of right female breast: Secondary | ICD-10-CM | POA: Diagnosis not present

## 2017-09-27 DIAGNOSIS — C50919 Malignant neoplasm of unspecified site of unspecified female breast: Secondary | ICD-10-CM | POA: Insufficient documentation

## 2017-09-27 DIAGNOSIS — C7951 Secondary malignant neoplasm of bone: Secondary | ICD-10-CM | POA: Diagnosis not present

## 2017-09-27 DIAGNOSIS — Z79899 Other long term (current) drug therapy: Secondary | ICD-10-CM | POA: Diagnosis not present

## 2017-09-27 LAB — UPEP/UIFE/LIGHT CHAINS/TP, 24-HR UR
% BETA, URINE: 18.6 %
ALPHA 1 URINE: 4.6 %
ALPHA 2 UR: 16.5 %
Albumin, U: 42.7 %
FREE KAPPA/LAMBDA RATIO: 8.34 (ref 2.04–10.37)
Free Kappa Lt Chains,Ur: 43.1 mg/L — ABNORMAL HIGH (ref 1.35–24.19)
Free Lambda Lt Chains,Ur: 5.17 mg/L (ref 0.24–6.66)
GAMMA GLOBULIN URINE: 17.5 %
TOTAL PROTEIN, URINE-UPE24: 8.7 mg/dL
TOTAL PROTEIN, URINE-UR/DAY: 261 mg/(24.h) — AB (ref 30–150)
TOTAL VOLUME: 3000

## 2017-09-27 NOTE — Addendum Note (Signed)
Addended by: Betsy Coder B on: 09/27/2017 04:19 PM   Modules accepted: Orders

## 2017-09-27 NOTE — Progress Notes (Signed)
START ON PATHWAY REGIMEN - Breast     A cycle is every 28 days (3 weeks on and 1 week off):     Paclitaxel   **Always confirm dose/schedule in your pharmacy ordering system**  Patient Characteristics: Distant Metastases or Locoregional Recurrent Disease - Unresected, HER2 Negative/Unknown/Equivocal, ER Negative/Unknown, Chemotherapy, First Line Therapeutic Status: Distant Metastases BRCA Mutation Status: Awaiting Test Results ER Status: Negative (-) HER2 Status: Negative (-) PR Status: Negative (-) Line of therapy: First Line Intent of Therapy: Non-Curative / Palliative Intent, Discussed with Patient

## 2017-09-27 NOTE — Progress Notes (Signed)
Olivia Patel OFFICE PROGRESS NOTE   Diagnosis: Breast cancer  INTERVAL HISTORY:   Olivia Patel was admitted 09/17/2017 with severe hypercalcemia and dehydration.  She was diagnosed with metastatic breast cancer.  I followed her while she was in the hospital.  The hypercalcemia improved with hydration, calcitonin, and biphosphonate therapy.  She was discharged home yesterday. She reports feeling well since discharge from the hospital.  Good appetite.  No pain.  No dyspnea.  Objective:  Vital signs in last 24 hours:  Blood pressure 131/70, pulse 93, temperature 98.5 F (36.9 C), temperature source Oral, resp. rate 18, height '5\' 3"'$  (1.6 m), weight 190 lb 6.4 oz (86.4 kg), last menstrual period 05/29/2015, SpO2 95 %.    HEENT: No thrush Lymphatic: 1.5 cm right supraclavicular node Resp: Decreased breath sounds at the right greater than left lower posterior chest with end inspiratory rhonchi/rub bilaterally, no respiratory distress Cardio: Regular rate and rhythm GI: No hepatomegaly Vascular: No leg edema Breast: Firm mass occupying the majority of the right breast, the skin thickening and edema of the right breast   Lab Results:  Lab Results  Component Value Date   WBC 21.8 (H) 09/26/2017   HGB 8.1 (L) 09/26/2017   HCT 24.8 (L) 09/26/2017   MCV 86.1 09/26/2017   PLT 261 09/26/2017   NEUTROABS 18.9 (H) 09/25/2017    CMP  Lab Results  Component Value Date   NA 134 (L) 09/26/2017   K 3.3 (L) 09/26/2017   CL 99 (L) 09/26/2017   CO2 25 09/26/2017   GLUCOSE 173 (H) 09/26/2017   BUN 26 (H) 09/26/2017   CREATININE 1.51 (H) 09/26/2017   CALCIUM 11.4 (H) 09/26/2017   PROT 5.9 (L) 09/21/2017   ALBUMIN 2.3 (L) 09/21/2017   AST 33 09/21/2017   ALT 25 09/21/2017   ALKPHOS 95 09/21/2017   BILITOT 1.1 09/21/2017   GFRNONAA 38 (L) 09/26/2017   GFRAA 44 (L) 09/26/2017     Imaging:  Dg Chest Port 1 View  Result Date: 09/24/2017 CLINICAL DATA:  Cough EXAM: PORTABLE  CHEST 1 VIEW COMPARISON:  09/18/2017 FINDINGS: Heart size is normal. Mediastinal shadows are normal. The right chest appears clear. There is mild blunting of the costophrenic angle on the left consistent with a small left effusion. IMPRESSION: The only abnormal chest radiographic finding is blunting of the costophrenic angle on the left consistent with a small left effusion. Electronically Signed   By: Nelson Chimes M.D.   On: 09/24/2017 09:22    Medications: I have reviewed the patient's current medications.   Assessment/Plan: 1.  Metastatic breast cancer  Large right breast mass with diffuse skin thickening  CTs 09/19/2017 with mediastinal/hilar adenopathy, subcarinal adenopathy, widespread bone lesions, right breast mass with skin thickening  MRI of the brain 09/19/2017-negative for brain metastases  Ultrasound-guided biopsy of the right breast mass on 09/21/2017, invasive mammary carcinoma, HER-2 negative, ER negative, PR negative,Ki-67 90% 2.Hypercalcemia, status post pamidronate 09/17/2017, calcitonin initiated 09/18/2017, Zometa 09/24/2017 3.Dehydration secondary to hypercalcemia, resolved 4.Lytic bone lesions, rib fractures secondary to #1 5.Hypertension 6.  Renal failure-?  Secondary to ATN, improving 7.  Fever during hospital admission 09/17/2017-cultures negative, no apparent source for infection, tumor fever? 8.  Anemia secondary to metastatic breast cancer and phlebotomy   Disposition: Olivia Patel has been diagnosed with metastatic breast cancer.  The tumor is HER-2 and hormone receptor negative.  She has extensive bone metastases and hypercalcemia.  She understands no therapy will be curative.  I recommend beginning treatment with systemic chemotherapy.  She appears to be a candidate for single agent chemotherapy.  I recommend weekly Taxol.  I reviewed the potential toxicities associated with Taxol.  She understands the chance of an allergic reaction, nausea, bone pain,  alopecia, neuropathy, and hematologic toxicity.  Olivia Patel agrees to proceed.  She will attended chemotherapy teaching class today. The plan is to begin Taxol on a day 1, day 8, day 15 schedule this week.  Olivia Patel will be referred for placement of a Port-A-Cath.  We will check the calcium level when she returns for chemotherapy this week.  We will consider referring her for a palliative mastectomy if she has a significant response to chemotherapy.  40 minutes were spent with the patient today.  The majority of the time was used for counseling and coordination of care.  Olivia Coder, MD  09/27/2017  11:47 AM

## 2017-09-28 ENCOUNTER — Other Ambulatory Visit: Payer: Self-pay

## 2017-09-28 ENCOUNTER — Telehealth: Payer: Self-pay

## 2017-09-28 DIAGNOSIS — Z171 Estrogen receptor negative status [ER-]: Principal | ICD-10-CM

## 2017-09-28 DIAGNOSIS — C50811 Malignant neoplasm of overlapping sites of right female breast: Secondary | ICD-10-CM

## 2017-09-28 MED ORDER — DEXAMETHASONE 4 MG PO TABS: ORAL_TABLET | ORAL | 0 refills | Status: DC

## 2017-09-28 NOTE — Telephone Encounter (Signed)
Called to inform pt that Decadron script was sent in to pharmacy. Informed pt of instructions. Pt and sister voiced understanding.

## 2017-09-29 ENCOUNTER — Inpatient Hospital Stay: Payer: Medicare Other

## 2017-09-29 ENCOUNTER — Encounter: Payer: Self-pay | Admitting: *Deleted

## 2017-09-29 ENCOUNTER — Other Ambulatory Visit: Payer: Self-pay

## 2017-09-29 ENCOUNTER — Telehealth: Payer: Self-pay

## 2017-09-29 DIAGNOSIS — Z171 Estrogen receptor negative status [ER-]: Principal | ICD-10-CM

## 2017-09-29 DIAGNOSIS — D630 Anemia in neoplastic disease: Secondary | ICD-10-CM | POA: Diagnosis not present

## 2017-09-29 DIAGNOSIS — C50811 Malignant neoplasm of overlapping sites of right female breast: Secondary | ICD-10-CM

## 2017-09-29 DIAGNOSIS — I10 Essential (primary) hypertension: Secondary | ICD-10-CM | POA: Diagnosis not present

## 2017-09-29 DIAGNOSIS — C7951 Secondary malignant neoplasm of bone: Secondary | ICD-10-CM | POA: Diagnosis not present

## 2017-09-29 DIAGNOSIS — Z5111 Encounter for antineoplastic chemotherapy: Secondary | ICD-10-CM | POA: Diagnosis not present

## 2017-09-29 DIAGNOSIS — Z79899 Other long term (current) drug therapy: Secondary | ICD-10-CM | POA: Diagnosis not present

## 2017-09-29 LAB — CMP (CANCER CENTER ONLY)
ALT: 26 U/L (ref 0–55)
AST: 52 U/L — ABNORMAL HIGH (ref 5–34)
Albumin: 2.2 g/dL — ABNORMAL LOW (ref 3.5–5.0)
Alkaline Phosphatase: 235 U/L — ABNORMAL HIGH (ref 40–150)
Anion gap: 11 (ref 3–11)
BUN: 27 mg/dL — ABNORMAL HIGH (ref 7–26)
CHLORIDE: 99 mmol/L (ref 98–109)
CO2: 22 mmol/L (ref 22–29)
CREATININE: 1.28 mg/dL — AB (ref 0.60–1.10)
Calcium: 13.1 mg/dL (ref 8.4–10.4)
GFR, Est AFR Am: 54 mL/min — ABNORMAL LOW (ref >60)
GFR, Estimated: 47 mL/min — ABNORMAL LOW (ref >60)
Glucose, Bld: 162 mg/dL — ABNORMAL HIGH (ref 70–140)
Potassium: 3.7 mmol/L (ref 3.5–5.1)
SODIUM: 132 mmol/L — AB (ref 136–145)
Total Bilirubin: 0.5 mg/dL (ref 0.2–1.2)
Total Protein: 7 g/dL (ref 6.4–8.3)

## 2017-09-29 LAB — CBC WITH DIFFERENTIAL (CANCER CENTER ONLY)
BASOS ABS: 0 10*3/uL (ref 0.0–0.1)
Basophils Relative: 0 %
Eosinophils Absolute: 0 10*3/uL (ref 0.0–0.5)
Eosinophils Relative: 0 %
HEMATOCRIT: 21.5 % — AB (ref 34.8–46.6)
HEMOGLOBIN: 7.1 g/dL — AB (ref 11.6–15.9)
LYMPHS PCT: 12 %
Lymphs Abs: 2.1 10*3/uL (ref 0.9–3.3)
MCH: 27.5 pg (ref 25.1–34.0)
MCHC: 33 g/dL (ref 31.5–36.0)
MCV: 83.3 fL (ref 79.5–101.0)
Monocytes Absolute: 0.9 10*3/uL (ref 0.1–0.9)
Monocytes Relative: 5 %
NEUTROS PCT: 83 %
Neutro Abs: 13.5 10*3/uL — ABNORMAL HIGH (ref 1.5–6.5)
Platelet Count: 305 10*3/uL (ref 145–400)
RBC: 2.58 MIL/uL — ABNORMAL LOW (ref 3.70–5.45)
RDW: 15.4 % — ABNORMAL HIGH (ref 11.2–14.5)
WBC Count: 16.5 10*3/uL — ABNORMAL HIGH (ref 3.9–10.3)

## 2017-09-29 LAB — ABO/RH: ABO/RH(D): O NEG

## 2017-09-29 LAB — PREPARE RBC (CROSSMATCH)

## 2017-09-29 MED ORDER — SODIUM CHLORIDE 0.9 % IV SOLN: Freq: Once | INTRAVENOUS | Status: DC

## 2017-09-29 MED ORDER — CALCITONIN (SALMON) 200 UNIT/ML IJ SOLN
350.0000 [IU] | Freq: Once | INTRAMUSCULAR | Status: AC
  Administered 2017-09-29: 350 [IU] via SUBCUTANEOUS
  Filled 2017-09-29: qty 1.75

## 2017-09-29 MED ORDER — SODIUM CHLORIDE 0.9 % IV SOLN
1000.0000 mL | Freq: Once | INTRAVENOUS | Status: AC
  Administered 2017-09-29: 1000 mL via INTRAVENOUS

## 2017-09-29 MED ORDER — CALCITONIN (SALMON) 200 UNIT/ML IJ SOLN
350.0000 [IU] | Freq: Once | INTRAMUSCULAR | Status: DC
  Filled 2017-09-29: qty 1.75

## 2017-09-29 MED ORDER — CALCITONIN (SALMON) 200 UNIT/ML IJ SOLN: 4.0000 [IU]/kg | Freq: Once | INTRAMUSCULAR | Status: DC

## 2017-09-29 NOTE — Progress Notes (Signed)
Per Melia, RN for Dr. Benay Spice, leave in pt IV from infusion today so we could use it tomorrow for pt treatment considering she is a very hard IV stick.  Pt IV left in place and wrapped with coban to protect it.

## 2017-09-29 NOTE — Telephone Encounter (Signed)
Spoke with pt daughter to offer appt for chemo today. Pt denied. MD made aware. Will keep appt for tomorrow.

## 2017-09-29 NOTE — Patient Instructions (Signed)
Dehydration, Adult Dehydration is when there is not enough fluid or water in your body. This happens when you lose more fluids than you take in. Dehydration can range from mild to very bad. It should be treated right away to keep it from getting very bad. Symptoms of mild dehydration may include:  Thirst.  Dry lips.  Slightly dry mouth.  Dry, warm skin.  Dizziness. Symptoms of moderate dehydration may include:  Very dry mouth.  Muscle cramps.  Dark pee (urine). Pee may be the color of tea.  Your body making less pee.  Your eyes making fewer tears.  Heartbeat that is uneven or faster than normal (palpitations).  Headache.  Light-headedness, especially when you stand up from sitting.  Fainting (syncope). Symptoms of very bad dehydration may include:  Changes in skin, such as: ? Cold and clammy skin. ? Blotchy (mottled) or pale skin. ? Skin that does not quickly return to normal after being lightly pinched and let go (poor skin turgor).  Changes in body fluids, such as: ? Feeling very thirsty. ? Your eyes making fewer tears. ? Not sweating when body temperature is high, such as in hot weather. ? Your body making very little pee.  Changes in vital signs, such as: ? Weak pulse. ? Pulse that is more than 100 beats a minute when you are sitting still. ? Fast breathing. ? Low blood pressure.  Other changes, such as: ? Sunken eyes. ? Cold hands and feet. ? Confusion. ? Lack of energy (lethargy). ? Trouble waking up from sleep. ? Short-term weight loss. ? Unconsciousness. Follow these instructions at home:  If told by your doctor, drink an ORS: ? Make an ORS by using instructions on the package. ? Start by drinking small amounts, about  cup (120 mL) every 5-10 minutes. ? Slowly drink more until you have had the amount that your doctor said to have.  Drink enough clear fluid to keep your pee clear or pale yellow. If you were told to drink an ORS, finish the ORS  first, then start slowly drinking clear fluids. Drink fluids such as: ? Water. Do not drink only water by itself. Doing that can make the salt (sodium) level in your body get too low (hyponatremia). ? Ice chips. ? Fruit juice that you have added water to (diluted). ? Low-calorie sports drinks.  Avoid: ? Alcohol. ? Drinks that have a lot of sugar. These include high-calorie sports drinks, fruit juice that does not have water added, and soda. ? Caffeine. ? Foods that are greasy or have a lot of fat or sugar.  Take over-the-counter and prescription medicines only as told by your doctor.  Do not take salt tablets. Doing that can make the salt level in your body get too high (hypernatremia).  Eat foods that have minerals (electrolytes). Examples include bananas, oranges, potatoes, tomatoes, and spinach.  Keep all follow-up visits as told by your doctor. This is important. Contact a doctor if:  You have belly (abdominal) pain that: ? Gets worse. ? Stays in one area (localizes).  You have a rash.  You have a stiff neck.  You get angry or annoyed more easily than normal (irritability).  You are more sleepy than normal.  You have a harder time waking up than normal.  You feel: ? Weak. ? Dizzy. ? Very thirsty.  You have peed (urinated) only a small amount of very dark pee during 6-8 hours. Get help right away if:  You have symptoms of   very bad dehydration.  You cannot drink fluids without throwing up (vomiting).  Your symptoms get worse with treatment.  You have a fever.  You have a very bad headache.  You are throwing up or having watery poop (diarrhea) and it: ? Gets worse. ? Does not go away.  You have blood or something green (bile) in your throw-up.  You have blood in your poop (stool). This may cause poop to look black and tarry.  You have not peed in 6-8 hours.  You pass out (faint).  Your heart rate when you are sitting still is more than 100 beats a  minute.  You have trouble breathing. This information is not intended to replace advice given to you by your health care provider. Make sure you discuss any questions you have with your health care provider. Document Released: 02/06/2009 Document Revised: 10/31/2015 Document Reviewed: 06/06/2015 Elsevier Interactive Patient Education  2018 [employer]  

## 2017-09-30 ENCOUNTER — Inpatient Hospital Stay: Payer: Medicare Other

## 2017-09-30 ENCOUNTER — Telehealth: Payer: Self-pay | Admitting: Oncology

## 2017-09-30 ENCOUNTER — Inpatient Hospital Stay (HOSPITAL_BASED_OUTPATIENT_CLINIC_OR_DEPARTMENT_OTHER): Payer: Medicare Other | Admitting: Oncology

## 2017-09-30 VITALS — BP 160/82 | HR 78 | Temp 98.2°F | Resp 19

## 2017-09-30 DIAGNOSIS — D630 Anemia in neoplastic disease: Secondary | ICD-10-CM

## 2017-09-30 DIAGNOSIS — Z171 Estrogen receptor negative status [ER-]: Secondary | ICD-10-CM

## 2017-09-30 DIAGNOSIS — C7951 Secondary malignant neoplasm of bone: Secondary | ICD-10-CM

## 2017-09-30 DIAGNOSIS — Z79899 Other long term (current) drug therapy: Secondary | ICD-10-CM | POA: Diagnosis not present

## 2017-09-30 DIAGNOSIS — I10 Essential (primary) hypertension: Secondary | ICD-10-CM | POA: Diagnosis not present

## 2017-09-30 DIAGNOSIS — C50811 Malignant neoplasm of overlapping sites of right female breast: Secondary | ICD-10-CM | POA: Diagnosis not present

## 2017-09-30 DIAGNOSIS — C50911 Malignant neoplasm of unspecified site of right female breast: Secondary | ICD-10-CM | POA: Diagnosis not present

## 2017-09-30 DIAGNOSIS — Z5111 Encounter for antineoplastic chemotherapy: Secondary | ICD-10-CM | POA: Diagnosis not present

## 2017-09-30 LAB — CMP (CANCER CENTER ONLY)
ALT: 26 U/L (ref 0–55)
ANION GAP: 12 — AB (ref 3–11)
AST: 44 U/L — ABNORMAL HIGH (ref 5–34)
Albumin: 2.2 g/dL — ABNORMAL LOW (ref 3.5–5.0)
Alkaline Phosphatase: 222 U/L — ABNORMAL HIGH (ref 40–150)
BUN: 27 mg/dL — ABNORMAL HIGH (ref 7–26)
CHLORIDE: 97 mmol/L — AB (ref 98–109)
CO2: 21 mmol/L — AB (ref 22–29)
Calcium: 11.8 mg/dL — ABNORMAL HIGH (ref 8.4–10.4)
Creatinine: 1.16 mg/dL — ABNORMAL HIGH (ref 0.60–1.10)
GFR, Estimated: 52 mL/min — ABNORMAL LOW (ref >60)
Glucose, Bld: 282 mg/dL — ABNORMAL HIGH (ref 70–140)
Potassium: 3.9 mmol/L (ref 3.5–5.1)
SODIUM: 130 mmol/L — AB (ref 136–145)
Total Bilirubin: 0.4 mg/dL (ref 0.2–1.2)
Total Protein: 7.1 g/dL (ref 6.4–8.3)

## 2017-09-30 LAB — CANCER ANTIGEN 27.29: CA 27.29: 59 U/mL — ABNORMAL HIGH (ref 0.0–38.6)

## 2017-09-30 LAB — PREPARE RBC (CROSSMATCH)

## 2017-09-30 MED ORDER — FAMOTIDINE IN NACL 20-0.9 MG/50ML-% IV SOLN
INTRAVENOUS | Status: AC
  Filled 2017-09-30: qty 50

## 2017-09-30 MED ORDER — SODIUM CHLORIDE 0.9 % IV SOLN
80.0000 mg/m2 | Freq: Once | INTRAVENOUS | Status: AC
  Administered 2017-09-30: 156 mg via INTRAVENOUS
  Filled 2017-09-30: qty 26

## 2017-09-30 MED ORDER — DEXAMETHASONE SODIUM PHOSPHATE 10 MG/ML IJ SOLN
10.0000 mg | Freq: Once | INTRAMUSCULAR | Status: AC
  Administered 2017-09-30: 10 mg via INTRAVENOUS

## 2017-09-30 MED ORDER — DEXAMETHASONE SODIUM PHOSPHATE 10 MG/ML IJ SOLN
INTRAMUSCULAR | Status: AC
  Filled 2017-09-30: qty 1

## 2017-09-30 MED ORDER — FAMOTIDINE IN NACL 20-0.9 MG/50ML-% IV SOLN
20.0000 mg | Freq: Once | INTRAVENOUS | Status: AC
  Administered 2017-09-30: 20 mg via INTRAVENOUS

## 2017-09-30 MED ORDER — DIPHENHYDRAMINE HCL 50 MG/ML IJ SOLN
INTRAMUSCULAR | Status: AC
  Filled 2017-09-30: qty 1

## 2017-09-30 MED ORDER — SODIUM CHLORIDE 0.9 % IV SOLN: 10.0000 mg | Freq: Once | INTRAVENOUS | Status: DC

## 2017-09-30 MED ORDER — DIPHENHYDRAMINE HCL 50 MG/ML IJ SOLN
50.0000 mg | Freq: Once | INTRAMUSCULAR | Status: AC
  Administered 2017-09-30: 50 mg via INTRAVENOUS

## 2017-09-30 MED ORDER — PROCHLORPERAZINE MALEATE 10 MG PO TABS: 10.0000 mg | ORAL_TABLET | Freq: Four times a day (QID) | ORAL | 0 refills | Status: AC | PRN

## 2017-09-30 MED ORDER — SODIUM CHLORIDE 0.9 % IV SOLN
Freq: Once | INTRAVENOUS | Status: AC
  Administered 2017-09-30: 11:00:00 via INTRAVENOUS

## 2017-09-30 NOTE — Progress Notes (Signed)
Ok to proceed with treatment with 09/29/17 CBC results per Dr. Benay Spice. Patient receiving 1 unit of PRBCs 09/30/17.  Discharge instructions printed and verbally reviewed. Patient and caregiver verbalized undrstanding.

## 2017-09-30 NOTE — Progress Notes (Signed)
  [facility] OFFICE PROGRESS NOTE   Diagnosis: Breast cancer  INTERVAL HISTORY:   Olivia Patel returns as scheduled.  The calcium was elevated yesterday.  She received intravenous fluids and calcitonin. She is here today with her sister.  She took her home Decadron premedication.  Objective:  Vital signs in last 24 hours:  Last menstrual period 05/29/2015.    HEENT: No thrush Resp: Decreased breath sounds with inspiratory rhonchi at the right greater than left posterior base, no respiratory distress Cardio: Regular rate and rhythm Vascular: No leg edema Neuro: Alert and oriented   Lab Results:  Lab Results  Component Value Date   WBC 16.5 (H) 09/29/2017   HGB 7.1 (L) 09/29/2017   HCT 21.5 (L) 09/29/2017   MCV 83.3 09/29/2017   PLT 305 09/29/2017   NEUTROABS 13.5 (H) 09/29/2017    CMP  Lab Results  Component Value Date   NA 130 (L) 09/30/2017   K 3.9 09/30/2017   CL 97 (L) 09/30/2017   CO2 21 (L) 09/30/2017   GLUCOSE 282 (H) 09/30/2017   BUN 27 (H) 09/30/2017   CREATININE 1.16 (H) 09/30/2017   CALCIUM 11.8 (H) 09/30/2017   PROT 7.1 09/30/2017   ALBUMIN 2.2 (L) 09/30/2017   AST 44 (H) 09/30/2017   ALT 26 09/30/2017   ALKPHOS 222 (H) 09/30/2017   BILITOT 0.4 09/30/2017   GFRNONAA 52 (L) 09/30/2017   GFRAA >60 09/30/2017    Medications: I have reviewed the patient's current medications.   Assessment/Plan:  1. Metastatic breast cancer  Large right breast mass with diffuse skin thickening  CTs 09/19/2017 with mediastinal/hilar adenopathy, subcarinal adenopathy, widespread bone lesions, right breast mass with skin thickening  MRI of the brain 09/19/2017-negative for brain metastases  Ultrasound-guided biopsy of the right breast mass on 09/21/2017, invasive mammary carcinoma, HER-2 negative, ER negative, PR negative,Ki-67 90%  Weekly Taxol initiated 09/30/2017 2.Hypercalcemia, status post pamidronate 09/17/2017, calcitonin initiated  09/18/2017, Zometa 09/24/2017 3.Dehydration secondary to hypercalcemia, resolved 4.Lytic bone lesions, rib fractures secondary to #1 5.Hypertension 6. Renal failure-? Secondary to ATN, improved 7. Fever during hospital admission 09/17/2017-cultures negative, no apparent source for infection, tumor fever? 8. Anemia secondary to metastatic breast cancer and phlebotomy- 1 unit of packed red blood cells 09/30/2017   Disposition: Olivia Patel has been diagnosed with metastatic breast cancer.  She will begin weekly Taxol chemotherapy today. She has severe anemia secondary to metastatic breast cancer involving the bones, chronic disease, and phlebotomy.  She will be transfused 1 unit of packed red cells today.  We discussed the risks associated with transfusion and she agrees to proceed.  Olivia Patel has poor peripheral IV access.  She is scheduled for Port-A-Cath placement on 10/07/2017.  We will try to get the Port-A-Cath placed at an earlier date so she can return for chemotherapy on 10/07/2017.  She will return for a repeat calcium level on 10/04/2017.  No 25 minutes were spent with the patient today.  The majority of the time was used for counseling and coordination of care.  Olivia Coder, MD  09/30/2017  1:06 PM

## 2017-09-30 NOTE — Telephone Encounter (Signed)
Scheduled appt per 6/7 los - unablet o schedule treatment due to capped day - logged - will contact patient with appt when scheduled.

## 2017-09-30 NOTE — Patient Instructions (Signed)
[facility] Discharge Instructions for Patients Receiving Chemotherapy  Today you received the following chemotherapy agents Taxol  To help prevent nausea and vomiting after your treatment, we encourage you to take your nausea medication as directed   If you develop nausea and vomiting that is not controlled by your nausea medication, call the clinic.   BELOW ARE SYMPTOMS THAT SHOULD BE REPORTED IMMEDIATELY:  *FEVER GREATER THAN 100.5 F  *CHILLS WITH OR WITHOUT FEVER  NAUSEA AND VOMITING THAT IS NOT CONTROLLED WITH YOUR NAUSEA MEDICATION  *UNUSUAL SHORTNESS OF BREATH  *UNUSUAL BRUISING OR BLEEDING  TENDERNESS IN MOUTH AND THROAT WITH OR WITHOUT PRESENCE OF ULCERS  *URINARY PROBLEMS  *BOWEL PROBLEMS  UNUSUAL RASH Items with * indicate a potential emergency and should be followed up as soon as possible.  Feel free to call the clinic should you have any questions or concerns. The clinic phone number is [phone].  Please show the [facility] at check-in to the Emergency Department and triage nurse.   Paclitaxel injection What is this medicine? PACLITAXEL (PAK li TAX el) is a chemotherapy drug. It targets fast dividing cells, like cancer cells, and causes these cells to die. This medicine is used to treat ovarian cancer, breast cancer, and other cancers. This medicine may be used for other purposes; ask your health care provider or pharmacist if you have questions. COMMON BRAND NAME(S): Onxol, Taxol What should I tell my health care provider before I take this medicine? They need to know if you have any of these conditions: -blood disorders -irregular heartbeat -infection (especially a virus infection such as chickenpox, cold sores, or herpes) -liver disease -previous or ongoing radiation therapy -an unusual or allergic reaction to paclitaxel, alcohol, polyoxyethylated castor oil, other chemotherapy agents, other medicines, foods, dyes, or  preservatives -pregnant or trying to get pregnant -breast-feeding How should I use this medicine? This drug is given as an infusion into a vein. It is administered in a hospital or clinic by a specially trained health care professional. Talk to your pediatrician regarding the use of this medicine in children. Special care may be needed. Overdosage: If you think you have taken too much of this medicine contact a poison control center or emergency room at once. NOTE: This medicine is only for you. Do not share this medicine with others. What if I miss a dose? It is important not to miss your dose. Call your doctor or health care professional if you are unable to keep an appointment. What may interact with this medicine? Do not take this medicine with any of the following medications: -disulfiram -metronidazole This medicine may also interact with the following medications: -cyclosporine -diazepam -ketoconazole -medicines to increase blood counts like filgrastim, pegfilgrastim, sargramostim -other chemotherapy drugs like cisplatin, doxorubicin, epirubicin, etoposide, teniposide, vincristine -quinidine -testosterone -vaccines -verapamil Talk to your doctor or health care professional before taking any of these medicines: -acetaminophen -aspirin -ibuprofen -ketoprofen -naproxen This list may not describe all possible interactions. Give your health care provider a list of all the medicines, herbs, non-prescription drugs, or dietary supplements you use. Also tell them if you smoke, drink alcohol, or use illegal drugs. Some items may interact with your medicine. What should I watch for while using this medicine? Your condition will be monitored carefully while you are receiving this medicine. You will need important blood work done while you are taking this medicine. This medicine can cause serious allergic reactions. To reduce your risk you will need to  take other medicine(s) before  treatment with this medicine. If you experience allergic reactions like skin rash, itching or hives, swelling of the face, lips, or tongue, tell your doctor or health care professional right away. In some cases, you may be given additional medicines to help with side effects. Follow all directions for their use. This drug may make you feel generally unwell. This is not uncommon, as chemotherapy can affect healthy cells as well as cancer cells. Report any side effects. Continue your course of treatment even though you feel ill unless your doctor tells you to stop. Call your doctor or health care professional for advice if you get a fever, chills or sore throat, or other symptoms of a cold or flu. Do not treat yourself. This drug decreases your body's ability to fight infections. Try to avoid being around people who are sick. This medicine may increase your risk to bruise or bleed. Call your doctor or health care professional if you notice any unusual bleeding. Be careful brushing and flossing your teeth or using a toothpick because you may get an infection or bleed more easily. If you have any dental work done, tell your dentist you are receiving this medicine. Avoid taking products that contain aspirin, acetaminophen, ibuprofen, naproxen, or ketoprofen unless instructed by your doctor. These medicines may hide a fever. Do not become pregnant while taking this medicine. Women should inform their doctor if they wish to become pregnant or think they might be pregnant. There is a potential for serious side effects to an unborn child. Talk to your health care professional or pharmacist for more information. Do not breast-feed an infant while taking this medicine. Men are advised not to father a child while receiving this medicine. This product may contain alcohol. Ask your pharmacist or healthcare provider if this medicine contains alcohol. Be sure to tell all healthcare providers you are taking this medicine.  Certain medicines, like metronidazole and disulfiram, can cause an unpleasant reaction when taken with alcohol. The reaction includes flushing, headache, nausea, vomiting, sweating, and increased thirst. The reaction can last from 30 minutes to several hours. What side effects may I notice from receiving this medicine? Side effects that you should report to your doctor or health care professional as soon as possible: -allergic reactions like skin rash, itching or hives, swelling of the face, lips, or tongue -low blood counts - This drug may decrease the number of white blood cells, red blood cells and platelets. You may be at increased risk for infections and bleeding. -signs of infection - fever or chills, cough, sore throat, pain or difficulty passing urine -signs of decreased platelets or bleeding - bruising, pinpoint red spots on the skin, black, tarry stools, nosebleeds -signs of decreased red blood cells - unusually weak or tired, fainting spells, lightheadedness -breathing problems -chest pain -high or low blood pressure -mouth sores -nausea and vomiting -pain, swelling, redness or irritation at the injection site -pain, tingling, numbness in the hands or feet -slow or irregular heartbeat -swelling of the ankle, feet, hands Side effects that usually do not require medical attention (report to your doctor or health care professional if they continue or are bothersome): -bone pain -complete hair loss including hair on your head, underarms, pubic hair, eyebrows, and eyelashes -changes in the color of fingernails -diarrhea -loosening of the fingernails -loss of appetite -muscle or joint pain -red flush to skin -sweating This list may not describe all possible side effects. Call your doctor for medical advice about  side effects. You may report side effects to FDA at 1-800-FDA-1088. Where should I keep my medicine? This drug is given in a hospital or clinic and will not be stored at  home. NOTE: This sheet is a summary. It may not cover all possible information. If you have questions about this medicine, talk to your doctor, pharmacist, or health care provider.  2018 Elsevier/Gold Standard (2015-02-11 19:58:00)  Blood Transfusion, Care After This sheet gives you information about how to care for yourself after your procedure. Your doctor may also give you more specific instructions. If you have problems or questions, contact your doctor. Follow these instructions at home:  Take over-the-counter and prescription medicines only as told by your doctor.  Go back to your normal activities as told by your doctor.  Follow instructions from your doctor about how to take care of the area where an IV tube was put into your vein (insertion site). Make sure you: ? Wash your hands with soap and water before you change your bandage (dressing). If there is no soap and water, use hand sanitizer. ? Change your bandage as told by your doctor.  Check your IV insertion site every day for signs of infection. Check for: ? More redness, swelling, or pain. ? More fluid or blood. ? Warmth. ? Pus or a bad smell. Contact a doctor if:  You have more redness, swelling, or pain around the IV insertion site..  You have more fluid or blood coming from the IV insertion site.  Your IV insertion site feels warm to the touch.  You have pus or a bad smell coming from the IV insertion site.  Your pee (urine) turns pink, red, or brown.  You feel weak after doing your normal activities. Get help right away if:  You have signs of a serious allergic or body defense (immune) system reaction, including: ? Itchiness. ? Hives. ? Trouble breathing. ? Anxiety. ? Pain in your chest or lower back. ? Fever, flushing, and chills. ? Fast pulse. ? Rash. ? Watery poop (diarrhea). ? Throwing up (vomiting). ? Dark pee. ? Serious headache. ? Dizziness. ? Stiff neck. ? Yellow color in your face or the  white parts of your eyes (jaundice). Summary  After a blood transfusion, return to your normal activities as told by your doctor.  Every day, check for signs of infection where the IV tube was put into your vein.  Some signs of infection are warm skin, more redness and pain, more fluid or blood, and pus or a bad smell where the needle went in.  Contact your doctor if you feel weak or have any unusual symptoms. This information is not intended to replace advice given to you by your health care provider. Make sure you discuss any questions you have with your health care provider. Document Released: 05/03/2014 Document Revised: 12/05/2015 Document Reviewed: 12/05/2015 Elsevier Interactive Patient Education  2017 [employer].

## 2017-10-01 ENCOUNTER — Inpatient Hospital Stay: Payer: Medicare Other

## 2017-10-02 ENCOUNTER — Other Ambulatory Visit: Payer: Self-pay | Admitting: Oncology

## 2017-10-03 ENCOUNTER — Telehealth: Payer: Self-pay | Admitting: Nurse Practitioner

## 2017-10-03 ENCOUNTER — Telehealth: Payer: Self-pay | Admitting: Oncology

## 2017-10-03 ENCOUNTER — Encounter: Payer: Self-pay | Admitting: *Deleted

## 2017-10-03 DIAGNOSIS — G893 Neoplasm related pain (acute) (chronic): Secondary | ICD-10-CM | POA: Diagnosis not present

## 2017-10-03 DIAGNOSIS — N289 Disorder of kidney and ureter, unspecified: Secondary | ICD-10-CM | POA: Diagnosis not present

## 2017-10-03 DIAGNOSIS — Z7951 Long term (current) use of inhaled steroids: Secondary | ICD-10-CM | POA: Diagnosis not present

## 2017-10-03 DIAGNOSIS — M1990 Unspecified osteoarthritis, unspecified site: Secondary | ICD-10-CM | POA: Diagnosis not present

## 2017-10-03 DIAGNOSIS — C50911 Malignant neoplasm of unspecified site of right female breast: Secondary | ICD-10-CM | POA: Diagnosis not present

## 2017-10-03 DIAGNOSIS — R591 Generalized enlarged lymph nodes: Secondary | ICD-10-CM | POA: Diagnosis not present

## 2017-10-03 DIAGNOSIS — C7951 Secondary malignant neoplasm of bone: Secondary | ICD-10-CM | POA: Diagnosis not present

## 2017-10-03 DIAGNOSIS — I10 Essential (primary) hypertension: Secondary | ICD-10-CM | POA: Diagnosis not present

## 2017-10-03 DIAGNOSIS — M8458XD Pathological fracture in neoplastic disease, other specified site, subsequent encounter for fracture with routine healing: Secondary | ICD-10-CM | POA: Diagnosis not present

## 2017-10-03 DIAGNOSIS — J9811 Atelectasis: Secondary | ICD-10-CM | POA: Diagnosis not present

## 2017-10-03 DIAGNOSIS — D630 Anemia in neoplastic disease: Secondary | ICD-10-CM | POA: Diagnosis not present

## 2017-10-03 LAB — BPAM RBC
BLOOD PRODUCT EXPIRATION DATE: 201907062359
Blood Product Expiration Date: 201907062359
ISSUE DATE / TIME: 201906071143
Unit Type and Rh: 9500
Unit Type and Rh: 9500

## 2017-10-03 LAB — TYPE AND SCREEN
ABO/RH(D): O NEG
Antibody Screen: NEGATIVE
Unit division: 0
Unit division: 0

## 2017-10-03 NOTE — Telephone Encounter (Signed)
Copied from [place] [id number]. Topic: Inquiry >> Oct 03, 2017  1:21 PM Lennox Solders wrote: Reason for CRM: jeff [facility] is calling requesting verbal order . 2 x 2 wk then 1 x 2 wk lower extremity strength and gait and  balance training . Merry Proud would like a nursing consult also for disease management, nutritional needs and medication

## 2017-10-03 NOTE — Telephone Encounter (Signed)
Pt daughter aware of appts added per 6/7 los -

## 2017-10-04 ENCOUNTER — Inpatient Hospital Stay: Payer: Medicare Other

## 2017-10-04 DIAGNOSIS — D630 Anemia in neoplastic disease: Secondary | ICD-10-CM | POA: Diagnosis not present

## 2017-10-04 DIAGNOSIS — I10 Essential (primary) hypertension: Secondary | ICD-10-CM | POA: Diagnosis not present

## 2017-10-04 DIAGNOSIS — C50911 Malignant neoplasm of unspecified site of right female breast: Secondary | ICD-10-CM

## 2017-10-04 DIAGNOSIS — C50811 Malignant neoplasm of overlapping sites of right female breast: Secondary | ICD-10-CM | POA: Diagnosis not present

## 2017-10-04 DIAGNOSIS — Z79899 Other long term (current) drug therapy: Secondary | ICD-10-CM | POA: Diagnosis not present

## 2017-10-04 DIAGNOSIS — C7951 Secondary malignant neoplasm of bone: Secondary | ICD-10-CM | POA: Diagnosis not present

## 2017-10-04 DIAGNOSIS — Z171 Estrogen receptor negative status [ER-]: Secondary | ICD-10-CM | POA: Diagnosis not present

## 2017-10-04 DIAGNOSIS — Z5111 Encounter for antineoplastic chemotherapy: Secondary | ICD-10-CM | POA: Diagnosis not present

## 2017-10-04 LAB — CMP (CANCER CENTER ONLY)
ALT: 33 U/L (ref 0–55)
ANION GAP: 11 (ref 3–11)
AST: 36 U/L — ABNORMAL HIGH (ref 5–34)
Albumin: 2.6 g/dL — ABNORMAL LOW (ref 3.5–5.0)
Alkaline Phosphatase: 175 U/L — ABNORMAL HIGH (ref 40–150)
BUN: 25 mg/dL (ref 7–26)
CHLORIDE: 100 mmol/L (ref 98–109)
CO2: 24 mmol/L (ref 22–29)
Calcium: 10.4 mg/dL (ref 8.4–10.4)
Creatinine: 0.9 mg/dL (ref 0.60–1.10)
Glucose, Bld: 244 mg/dL — ABNORMAL HIGH (ref 70–140)
Potassium: 3.5 mmol/L (ref 3.5–5.1)
Sodium: 135 mmol/L — ABNORMAL LOW (ref 136–145)
Total Bilirubin: 0.3 mg/dL (ref 0.2–1.2)
Total Protein: 6.4 g/dL (ref 6.4–8.3)

## 2017-10-04 NOTE — Telephone Encounter (Signed)
VO given for PT 2 x 2 week then 1 x 2 week lower extremity strength and gait and balance training.  I want to clarify the request for nursing consult for disease management, nutritional needs and medication- are they asking for home health referral ?

## 2017-10-05 ENCOUNTER — Other Ambulatory Visit: Payer: Self-pay | Admitting: Radiology

## 2017-10-05 NOTE — Telephone Encounter (Signed)
Notified jeff w/[facility] response. Merry Proud states pt/caregiver requesting a nursing consult. They have questions concerning her medications and chemotherapy. Ok verbal for nursing eval../lmb

## 2017-10-06 ENCOUNTER — Other Ambulatory Visit: Payer: Self-pay | Admitting: Radiology

## 2017-10-07 ENCOUNTER — Inpatient Hospital Stay (HOSPITAL_BASED_OUTPATIENT_CLINIC_OR_DEPARTMENT_OTHER): Payer: Medicare Other | Admitting: Nurse Practitioner

## 2017-10-07 ENCOUNTER — Encounter: Payer: Self-pay | Admitting: Nurse Practitioner

## 2017-10-07 ENCOUNTER — Inpatient Hospital Stay: Payer: Medicare Other

## 2017-10-07 ENCOUNTER — Ambulatory Visit (HOSPITAL_COMMUNITY): Admission: RE | Admit: 2017-10-07 | Payer: Medicare Other | Source: Ambulatory Visit

## 2017-10-07 VITALS — BP 177/97

## 2017-10-07 VITALS — BP 181/96 | HR 116 | Temp 98.3°F | Resp 17 | Ht 63.0 in | Wt 176.7 lb

## 2017-10-07 DIAGNOSIS — C50911 Malignant neoplasm of unspecified site of right female breast: Secondary | ICD-10-CM

## 2017-10-07 DIAGNOSIS — D630 Anemia in neoplastic disease: Secondary | ICD-10-CM | POA: Diagnosis not present

## 2017-10-07 DIAGNOSIS — C50811 Malignant neoplasm of overlapping sites of right female breast: Secondary | ICD-10-CM

## 2017-10-07 DIAGNOSIS — Z79899 Other long term (current) drug therapy: Secondary | ICD-10-CM | POA: Diagnosis not present

## 2017-10-07 DIAGNOSIS — Z171 Estrogen receptor negative status [ER-]: Secondary | ICD-10-CM

## 2017-10-07 DIAGNOSIS — C7951 Secondary malignant neoplasm of bone: Secondary | ICD-10-CM

## 2017-10-07 DIAGNOSIS — Z5111 Encounter for antineoplastic chemotherapy: Secondary | ICD-10-CM | POA: Diagnosis not present

## 2017-10-07 DIAGNOSIS — I10 Essential (primary) hypertension: Secondary | ICD-10-CM

## 2017-10-07 LAB — CBC WITH DIFFERENTIAL (CANCER CENTER ONLY)
BASOS PCT: 1 %
Basophils Absolute: 0.1 10*3/uL (ref 0.0–0.1)
Eosinophils Absolute: 0.2 10*3/uL (ref 0.0–0.5)
Eosinophils Relative: 2 %
HCT: 29.7 % — ABNORMAL LOW (ref 34.8–46.6)
HEMOGLOBIN: 9.8 g/dL — AB (ref 11.6–15.9)
Lymphocytes Relative: 19 %
Lymphs Abs: 1.4 10*3/uL (ref 0.9–3.3)
MCH: 28 pg (ref 25.1–34.0)
MCHC: 33 g/dL (ref 31.5–36.0)
MCV: 84.9 fL (ref 79.5–101.0)
Monocytes Absolute: 0.8 10*3/uL (ref 0.1–0.9)
Monocytes Relative: 12 %
NEUTROS ABS: 4.9 10*3/uL (ref 1.5–6.5)
NEUTROS PCT: 66 %
NRBC: 5 /100{WBCs} — AB
Platelet Count: 323 10*3/uL (ref 145–400)
RBC: 3.5 MIL/uL — AB (ref 3.70–5.45)
RDW: 15.5 % — ABNORMAL HIGH (ref 11.2–14.5)
WBC: 7.3 10*3/uL (ref 3.9–10.3)

## 2017-10-07 LAB — CMP (CANCER CENTER ONLY)
ALBUMIN: 3.3 g/dL — AB (ref 3.5–5.0)
ALT: 30 U/L (ref 0–55)
ANION GAP: 13 — AB (ref 3–11)
AST: 39 U/L — ABNORMAL HIGH (ref 5–34)
Alkaline Phosphatase: 216 U/L — ABNORMAL HIGH (ref 40–150)
BUN: 14 mg/dL (ref 7–26)
CALCIUM: 15.6 mg/dL — AB (ref 8.4–10.4)
CHLORIDE: 96 mmol/L — AB (ref 98–109)
CO2: 29 mmol/L (ref 22–29)
Creatinine: 0.97 mg/dL (ref 0.60–1.10)
GFR, Est AFR Am: >60 mL/min (ref >60)
GFR, Estimated: >60 mL/min (ref >60)
GLUCOSE: 122 mg/dL (ref 70–140)
Potassium: 3.9 mmol/L (ref 3.5–5.1)
SODIUM: 138 mmol/L (ref 136–145)
Total Bilirubin: 0.5 mg/dL (ref 0.2–1.2)
Total Protein: 7.7 g/dL (ref 6.4–8.3)

## 2017-10-07 LAB — SAMPLE TO BLOOD BANK

## 2017-10-07 MED ORDER — FAMOTIDINE IN NACL 20-0.9 MG/50ML-% IV SOLN
20.0000 mg | Freq: Once | INTRAVENOUS | Status: AC
  Administered 2017-10-07: 20 mg via INTRAVENOUS

## 2017-10-07 MED ORDER — DEXAMETHASONE SODIUM PHOSPHATE 10 MG/ML IJ SOLN
INTRAMUSCULAR | Status: AC
  Filled 2017-10-07: qty 1

## 2017-10-07 MED ORDER — SODIUM CHLORIDE 0.9 % IV SOLN
Freq: Once | INTRAVENOUS | Status: AC
  Administered 2017-10-07: 16:00:00 via INTRAVENOUS

## 2017-10-07 MED ORDER — ZOLEDRONIC ACID 4 MG/5ML IV CONC
4.0000 mg | Freq: Once | INTRAVENOUS | Status: DC
  Filled 2017-10-07: qty 5

## 2017-10-07 MED ORDER — DIPHENHYDRAMINE HCL 50 MG/ML IJ SOLN
INTRAMUSCULAR | Status: AC
  Filled 2017-10-07: qty 1

## 2017-10-07 MED ORDER — SODIUM CHLORIDE 0.9 % IV SOLN
80.0000 mg/m2 | Freq: Once | INTRAVENOUS | Status: AC
  Administered 2017-10-07: 156 mg via INTRAVENOUS
  Filled 2017-10-07: qty 26

## 2017-10-07 MED ORDER — DIPHENHYDRAMINE HCL 50 MG/ML IJ SOLN
25.0000 mg | Freq: Once | INTRAMUSCULAR | Status: AC
  Administered 2017-10-07: 25 mg via INTRAVENOUS

## 2017-10-07 MED ORDER — ZOLEDRONIC ACID 4 MG/100ML IV SOLN
4.0000 mg | Freq: Once | INTRAVENOUS | Status: AC
  Administered 2017-10-07: 4 mg via INTRAVENOUS
  Filled 2017-10-07: qty 100

## 2017-10-07 MED ORDER — FAMOTIDINE IN NACL 20-0.9 MG/50ML-% IV SOLN
INTRAVENOUS | Status: AC
Start: 2017-10-07 — End: ?
  Filled 2017-10-07: qty 50

## 2017-10-07 MED ORDER — DEXAMETHASONE SODIUM PHOSPHATE 10 MG/ML IJ SOLN
10.0000 mg | Freq: Once | INTRAMUSCULAR | Status: AC
  Administered 2017-10-07: 10 mg via INTRAVENOUS

## 2017-10-07 NOTE — Progress Notes (Signed)
Per Dr. Benay Spice, ok to proceed with treatment with only CBC results.

## 2017-10-07 NOTE — Progress Notes (Signed)
[facility] OFFICE PROGRESS NOTE   Diagnosis: Breast cancer  INTERVAL HISTORY:   Olivia Patel returns as scheduled.  She completed cycle 1 weekly Taxol 09/30/2017.  She had no signs of an allergic reaction.  No nausea or vomiting.  No mouth sores.  No diarrhea.  She has mild constipation.  No numbness or tingling in her hands or feet.  She reports appetite overall has been good.  No shortness of breath.  No fever.  She continues to have "congestion and a cough".  Objective:  Vital signs in last 24 hours:  Blood pressure (!) 181/96, pulse (!) 116, temperature 98.3 F (36.8 C), temperature source Oral, resp. rate 17, height '5\' 3"'$  (1.6 m), weight 176 lb 11.2 oz (80.2 kg), last menstrual period 05/29/2015, SpO2 97 %.    HEENT: No thrush or ulcers.  Resp: Distant breath sounds. Cardio: Regular, tachycardic. GI: No hepatomegaly. Vascular: Trace bilateral ankle/pedal edema. Neuro: Alert and oriented.  Follows commands. Breasts: Skin thickening, induration right lower breast.   Lab Results:  Lab Results  Component Value Date   WBC 7.3 10/07/2017   HGB 9.8 (L) 10/07/2017   HCT 29.7 (L) 10/07/2017   MCV 84.9 10/07/2017   PLT 323 10/07/2017   NEUTROABS 4.9 10/07/2017    Imaging:  No results found.  Medications: I have reviewed the patient's current medications.  Assessment/Plan: 1. Metastatic breast cancer  Large right breast mass with diffuse skin thickening  CTs 09/19/2017 with mediastinal/hilar adenopathy, subcarinal adenopathy, widespread bone lesions, right breast mass with skin thickening  MRI of the brain 09/19/2017-negative for brain metastases  Ultrasound-guided biopsy of the right breast mass on 09/21/2017, invasive mammary carcinoma, HER-2 negative, ER negative, PR negative,Ki-67 90%  Weekly Taxol initiated 09/30/2017 2.Hypercalcemia, status post pamidronate 09/17/2017, calcitonin initiated 09/18/2017, Zometa 09/24/2017, Zometa 10/07/2017 3.Dehydration  secondary to hypercalcemia, resolved 4.Lytic bone lesions, rib fractures secondary to #1 5.Hypertension 6. Renal failure-? Secondary to ATN, improved 7. [place] hospital admission 09/17/2017-cultures negative, no apparent source for infection, tumor fever? 8. Anemia secondary to metastatic breast cancer and phlebotomy- 1 unit of packed red blood cells 09/30/2017; improved 10/07/2017.    Disposition: Olivia Patel has metastatic breast cancer.  She has completed 1 treatment with weekly Taxol which she seems to have tolerated well.  Plan to proceed with week 2 today as scheduled.  She has significant hypercalcemia on labs today.  She will receive Zometa 4 mg IV.  She will return for a liter of IV fluids 10/08/2017.  She will return for repeat labs 10/10/2017.  She will return for lab, follow-up and Taxol in 1 week.  We will see her sooner if needed.  Patient seen with Dr. Benay Spice.  25 minutes were spent face-to-face at today's visit with the majority of that time involved in counseling/coordination of care.    Ned Card ANP/GNP-BC   10/07/2017  4:36 PM  This was a shared visit with Ned Card.  Olivia Patel was interviewed and examined.  She is alert and oriented this afternoon.  The calcium was lower earlier this week, but is again markedly elevated today.  She does not appear symptomatic from the hypercalcemia.  She will receive intravenous fluids and Zometa today.  The plan is to proceed with weekly Taxol.  She will return for a chemistry panel on 10/10/2017.  We will add carboplatin to the next weekly chemotherapy if the calcium level does not improve next week.  Julieanne Manson, MD

## 2017-10-07 NOTE — Patient Instructions (Addendum)
[facility] Discharge Instructions for Patients Receiving Chemotherapy  Today you received the following chemotherapy agents:  Taxol.  To help prevent nausea and vomiting after your treatment, we encourage you to take your nausea medication as directed.   If you develop nausea and vomiting that is not controlled by your nausea medication, call the clinic.   BELOW ARE SYMPTOMS THAT SHOULD BE REPORTED IMMEDIATELY:  *FEVER GREATER THAN 100.5 F  *CHILLS WITH OR WITHOUT FEVER  NAUSEA AND VOMITING THAT IS NOT CONTROLLED WITH YOUR NAUSEA MEDICATION  *UNUSUAL SHORTNESS OF BREATH  *UNUSUAL BRUISING OR BLEEDING  TENDERNESS IN MOUTH AND THROAT WITH OR WITHOUT PRESENCE OF ULCERS  *URINARY PROBLEMS  *BOWEL PROBLEMS  UNUSUAL RASH Items with * indicate a potential emergency and should be followed up as soon as possible.  Feel free to call the clinic should you have any questions or concerns. The clinic phone number is [phone].  Please show the [facility] at check-in to the Emergency Department and triage nurse.  Zoledronic Acid injection (Hypercalcemia, Oncology) What is this medicine? ZOLEDRONIC ACID (ZOE le dron ik AS id) lowers the amount of calcium loss from bone. It is used to treat too much calcium in your blood from cancer. It is also used to prevent complications of cancer that has spread to the bone. This medicine may be used for other purposes; ask your health care provider or pharmacist if you have questions. COMMON BRAND NAME(S): Zometa What should I tell my health care provider before I take this medicine? They need to know if you have any of these conditions: -aspirin-sensitive asthma -cancer, especially if you are receiving medicines used to treat cancer -dental disease or wear dentures -infection -kidney disease -receiving corticosteroids like dexamethasone or prednisone -an unusual or allergic reaction to zoledronic acid, other medicines,  foods, dyes, or preservatives -pregnant or trying to get pregnant -breast-feeding How should I use this medicine? This medicine is for infusion into a vein. It is given by a health care professional in a hospital or clinic setting. Talk to your pediatrician regarding the use of this medicine in children. Special care may be needed. Overdosage: If you think you have taken too much of this medicine contact a poison control center or emergency room at once. NOTE: This medicine is only for you. Do not share this medicine with others. What if I miss a dose? It is important not to miss your dose. Call your doctor or health care professional if you are unable to keep an appointment. What may interact with this medicine? -certain antibiotics given by injection -NSAIDs, medicines for pain and inflammation, like ibuprofen or naproxen -some diuretics like bumetanide, furosemide -teriparatide -thalidomide This list may not describe all possible interactions. Give your health care provider a list of all the medicines, herbs, non-prescription drugs, or dietary supplements you use. Also tell them if you smoke, drink alcohol, or use illegal drugs. Some items may interact with your medicine. What should I watch for while using this medicine? Visit your doctor or health care professional for regular checkups. It may be some time before you see the benefit from this medicine. Do not stop taking your medicine unless your doctor tells you to. Your doctor may order blood tests or other tests to see how you are doing. Women should inform their doctor if they wish to become pregnant or think they might be pregnant. There is a potential for serious side effects to an unborn child. Talk  to your health care professional or pharmacist for more information. You should make sure that you get enough calcium and vitamin D while you are taking this medicine. Discuss the foods you eat and the vitamins you take with your health  care professional. Some people who take this medicine have severe bone, joint, and/or muscle pain. This medicine may also increase your risk for jaw problems or a broken thigh bone. Tell your doctor right away if you have severe pain in your jaw, bones, joints, or muscles. Tell your doctor if you have any pain that does not go away or that gets worse. Tell your dentist and dental surgeon that you are taking this medicine. You should not have major dental surgery while on this medicine. See your dentist to have a dental exam and fix any dental problems before starting this medicine. Take good care of your teeth while on this medicine. Make sure you see your dentist for regular follow-up appointments. What side effects may I notice from receiving this medicine? Side effects that you should report to your doctor or health care professional as soon as possible: -allergic reactions like skin rash, itching or hives, swelling of the face, lips, or tongue -anxiety, confusion, or depression -breathing problems -changes in vision -eye pain -feeling faint or lightheaded, falls -jaw pain, especially after dental work -mouth sores -muscle cramps, stiffness, or weakness -redness, blistering, peeling or loosening of the skin, including inside the mouth -trouble passing urine or change in the amount of urine Side effects that usually do not require medical attention (report to your doctor or health care professional if they continue or are bothersome): -bone, joint, or muscle pain -constipation -diarrhea -fever -hair loss -irritation at site where injected -loss of appetite -nausea, vomiting -stomach upset -trouble sleeping -trouble swallowing -weak or tired This list may not describe all possible side effects. Call your doctor for medical advice about side effects. You may report side effects to FDA at 1-800-FDA-1088. Where should I keep my medicine? This drug is given in a hospital or clinic and  will not be stored at home. NOTE: This sheet is a summary. It may not cover all possible information. If you have questions about this medicine, talk to your doctor, pharmacist, or health care provider.  2018 Elsevier/Gold Standard (2013-09-08 14:19:39)

## 2017-10-07 NOTE — Progress Notes (Signed)
Ok to treat with premedications while waiting for labs to result then we only have to wait for a new CBC to treat, CMET from 6/11 is ok per MD Benay Spice.

## 2017-10-08 ENCOUNTER — Telehealth: Payer: Self-pay | Admitting: *Deleted

## 2017-10-08 ENCOUNTER — Inpatient Hospital Stay: Payer: Medicare Other

## 2017-10-08 ENCOUNTER — Other Ambulatory Visit: Payer: Self-pay | Admitting: *Deleted

## 2017-10-08 DIAGNOSIS — Z171 Estrogen receptor negative status [ER-]: Secondary | ICD-10-CM | POA: Diagnosis not present

## 2017-10-08 DIAGNOSIS — C50811 Malignant neoplasm of overlapping sites of right female breast: Secondary | ICD-10-CM | POA: Diagnosis not present

## 2017-10-08 DIAGNOSIS — I10 Essential (primary) hypertension: Secondary | ICD-10-CM | POA: Diagnosis not present

## 2017-10-08 DIAGNOSIS — D630 Anemia in neoplastic disease: Secondary | ICD-10-CM | POA: Diagnosis not present

## 2017-10-08 DIAGNOSIS — Z79899 Other long term (current) drug therapy: Secondary | ICD-10-CM | POA: Diagnosis not present

## 2017-10-08 DIAGNOSIS — C7951 Secondary malignant neoplasm of bone: Secondary | ICD-10-CM | POA: Diagnosis not present

## 2017-10-08 DIAGNOSIS — Z5111 Encounter for antineoplastic chemotherapy: Secondary | ICD-10-CM | POA: Diagnosis not present

## 2017-10-08 MED ORDER — SODIUM CHLORIDE 0.9 % IV SOLN: INTRAVENOUS | Status: DC

## 2017-10-08 MED ORDER — SODIUM CHLORIDE 0.9 % IV SOLN
Freq: Once | INTRAVENOUS | Status: AC
  Administered 2017-10-08: 09:00:00 via INTRAVENOUS

## 2017-10-08 NOTE — Telephone Encounter (Signed)
Plan discussed with Ned Card, NP: Pt is to have labs repeated on 6/17, keep infusion appt that day in case additional hydration is needed.  Pt and family made aware. Instructed them to check in at 0815 on 6/17.

## 2017-10-08 NOTE — Patient Instructions (Signed)
Dehydration, Adult Dehydration is when there is not enough fluid or water in your body. This happens when you lose more fluids than you take in. Dehydration can range from mild to very bad. It should be treated right away to keep it from getting very bad. Symptoms of mild dehydration may include:  Thirst.  Dry lips.  Slightly dry mouth.  Dry, warm skin.  Dizziness. Symptoms of moderate dehydration may include:  Very dry mouth.  Muscle cramps.  Dark pee (urine). Pee may be the color of tea.  Your body making less pee.  Your eyes making fewer tears.  Heartbeat that is uneven or faster than normal (palpitations).  Headache.  Light-headedness, especially when you stand up from sitting.  Fainting (syncope). Symptoms of very bad dehydration may include:  Changes in skin, such as: ? Cold and clammy skin. ? Blotchy (mottled) or pale skin. ? Skin that does not quickly return to normal after being lightly pinched and let go (poor skin turgor).  Changes in body fluids, such as: ? Feeling very thirsty. ? Your eyes making fewer tears. ? Not sweating when body temperature is high, such as in hot weather. ? Your body making very little pee.  Changes in vital signs, such as: ? Weak pulse. ? Pulse that is more than 100 beats a minute when you are sitting still. ? Fast breathing. ? Low blood pressure.  Other changes, such as: ? Sunken eyes. ? Cold hands and feet. ? Confusion. ? Lack of energy (lethargy). ? Trouble waking up from sleep. ? Short-term weight loss. ? Unconsciousness. Follow these instructions at home:  If told by your doctor, drink an ORS: ? Make an ORS by using instructions on the package. ? Start by drinking small amounts, about  cup (120 mL) every 5-10 minutes. ? Slowly drink more until you have had the amount that your doctor said to have.  Drink enough clear fluid to keep your pee clear or pale yellow. If you were told to drink an ORS, finish the ORS  first, then start slowly drinking clear fluids. Drink fluids such as: ? Water. Do not drink only water by itself. Doing that can make the salt (sodium) level in your body get too low (hyponatremia). ? Ice chips. ? Fruit juice that you have added water to (diluted). ? Low-calorie sports drinks.  Avoid: ? Alcohol. ? Drinks that have a lot of sugar. These include high-calorie sports drinks, fruit juice that does not have water added, and soda. ? Caffeine. ? Foods that are greasy or have a lot of fat or sugar.  Take over-the-counter and prescription medicines only as told by your doctor.  Do not take salt tablets. Doing that can make the salt level in your body get too high (hypernatremia).  Eat foods that have minerals (electrolytes). Examples include bananas, oranges, potatoes, tomatoes, and spinach.  Keep all follow-up visits as told by your doctor. This is important. Contact a doctor if:  You have belly (abdominal) pain that: ? Gets worse. ? Stays in one area (localizes).  You have a rash.  You have a stiff neck.  You get angry or annoyed more easily than normal (irritability).  You are more sleepy than normal.  You have a harder time waking up than normal.  You feel: ? Weak. ? Dizzy. ? Very thirsty.  You have peed (urinated) only a small amount of very dark pee during 6-8 hours. Get help right away if:  You have symptoms of   very bad dehydration.  You cannot drink fluids without throwing up (vomiting).  Your symptoms get worse with treatment.  You have a fever.  You have a very bad headache.  You are throwing up or having watery poop (diarrhea) and it: ? Gets worse. ? Does not go away.  You have blood or something green (bile) in your throw-up.  You have blood in your poop (stool). This may cause poop to look black and tarry.  You have not peed in 6-8 hours.  You pass out (faint).  Your heart rate when you are sitting still is more than 100 beats a  minute.  You have trouble breathing. This information is not intended to replace advice given to you by your health care provider. Make sure you discuss any questions you have with your health care provider. Document Released: 02/06/2009 Document Revised: 10/31/2015 Document Reviewed: 06/06/2015 Elsevier Interactive Patient Education  2018 [employer]  

## 2017-10-10 ENCOUNTER — Inpatient Hospital Stay: Payer: Medicare Other

## 2017-10-10 ENCOUNTER — Telehealth: Payer: Self-pay | Admitting: Nurse Practitioner

## 2017-10-10 DIAGNOSIS — C50911 Malignant neoplasm of unspecified site of right female breast: Secondary | ICD-10-CM

## 2017-10-10 DIAGNOSIS — C50811 Malignant neoplasm of overlapping sites of right female breast: Secondary | ICD-10-CM | POA: Diagnosis not present

## 2017-10-10 DIAGNOSIS — C7951 Secondary malignant neoplasm of bone: Secondary | ICD-10-CM | POA: Diagnosis not present

## 2017-10-10 DIAGNOSIS — Z171 Estrogen receptor negative status [ER-]: Secondary | ICD-10-CM | POA: Diagnosis not present

## 2017-10-10 DIAGNOSIS — I10 Essential (primary) hypertension: Secondary | ICD-10-CM | POA: Diagnosis not present

## 2017-10-10 DIAGNOSIS — D630 Anemia in neoplastic disease: Secondary | ICD-10-CM | POA: Diagnosis not present

## 2017-10-10 DIAGNOSIS — Z5111 Encounter for antineoplastic chemotherapy: Secondary | ICD-10-CM | POA: Diagnosis not present

## 2017-10-10 DIAGNOSIS — Z79899 Other long term (current) drug therapy: Secondary | ICD-10-CM | POA: Diagnosis not present

## 2017-10-10 LAB — CBC WITH DIFFERENTIAL (CANCER CENTER ONLY)
BASOS PCT: 0 %
Basophils Absolute: 0 10*3/uL (ref 0.0–0.1)
EOS ABS: 0.1 10*3/uL (ref 0.0–0.5)
EOS PCT: 2 %
HCT: 27 % — ABNORMAL LOW (ref 34.8–46.6)
Hemoglobin: 8.8 g/dL — ABNORMAL LOW (ref 11.6–15.9)
Lymphocytes Relative: 22 %
Lymphs Abs: 1.2 10*3/uL (ref 0.9–3.3)
MCH: 27.9 pg (ref 25.1–34.0)
MCHC: 32.6 g/dL (ref 31.5–36.0)
MCV: 85.7 fL (ref 79.5–101.0)
MONO ABS: 0.2 10*3/uL (ref 0.1–0.9)
MONOS PCT: 4 %
Neutro Abs: 4 10*3/uL (ref 1.5–6.5)
Neutrophils Relative %: 72 %
PLATELETS: 219 10*3/uL (ref 145–400)
RBC: 3.15 MIL/uL — ABNORMAL LOW (ref 3.70–5.45)
RDW: 16.2 % — AB (ref 11.2–14.5)
WBC Count: 5.6 10*3/uL (ref 3.9–10.3)

## 2017-10-10 LAB — CMP (CANCER CENTER ONLY)
ALBUMIN: 3.2 g/dL — AB (ref 3.5–5.0)
ALT: 21 U/L (ref 0–55)
ANION GAP: 12 — AB (ref 3–11)
AST: 65 U/L — ABNORMAL HIGH (ref 5–34)
Alkaline Phosphatase: 177 U/L — ABNORMAL HIGH (ref 40–150)
BILIRUBIN TOTAL: 0.5 mg/dL (ref 0.2–1.2)
BUN: 18 mg/dL (ref 7–26)
CO2: 25 mmol/L (ref 22–29)
Calcium: 10.6 mg/dL — ABNORMAL HIGH (ref 8.4–10.4)
Chloride: 99 mmol/L (ref 98–109)
Creatinine: 1.16 mg/dL — ABNORMAL HIGH (ref 0.60–1.10)
GFR, Est AFR Am: >60 mL/min (ref >60)
GFR, Estimated: 52 mL/min — ABNORMAL LOW (ref >60)
GLUCOSE: 170 mg/dL — AB (ref 70–140)
Potassium: 3.6 mmol/L (ref 3.5–5.1)
SODIUM: 136 mmol/L (ref 136–145)
TOTAL PROTEIN: 6.9 g/dL (ref 6.4–8.3)

## 2017-10-10 NOTE — Telephone Encounter (Signed)
Scheduled appt per 6/14 los - pt is aware of appt date and time.

## 2017-10-10 NOTE — Progress Notes (Signed)
Per Dr Benay Spice no fluids needed today after assessing lab results. Pt is eating and drinking with no problems. VSS. Gave pt copy of labs and informed pt of no need for fluids today. Pt verbalized understanding and will return for next scheduled appt.

## 2017-10-13 ENCOUNTER — Inpatient Hospital Stay: Payer: Medicare Other

## 2017-10-13 ENCOUNTER — Other Ambulatory Visit: Payer: Self-pay

## 2017-10-13 ENCOUNTER — Telehealth: Payer: Self-pay | Admitting: Nurse Practitioner

## 2017-10-13 ENCOUNTER — Inpatient Hospital Stay (HOSPITAL_BASED_OUTPATIENT_CLINIC_OR_DEPARTMENT_OTHER): Payer: Medicare Other | Admitting: Nurse Practitioner

## 2017-10-13 ENCOUNTER — Encounter: Payer: Self-pay | Admitting: Nurse Practitioner

## 2017-10-13 VITALS — BP 167/95 | HR 107 | Temp 98.6°F | Resp 18 | Ht 63.0 in | Wt 173.1 lb

## 2017-10-13 DIAGNOSIS — C50811 Malignant neoplasm of overlapping sites of right female breast: Secondary | ICD-10-CM | POA: Diagnosis not present

## 2017-10-13 DIAGNOSIS — C50911 Malignant neoplasm of unspecified site of right female breast: Secondary | ICD-10-CM

## 2017-10-13 DIAGNOSIS — Z5111 Encounter for antineoplastic chemotherapy: Secondary | ICD-10-CM | POA: Diagnosis not present

## 2017-10-13 DIAGNOSIS — D630 Anemia in neoplastic disease: Secondary | ICD-10-CM | POA: Diagnosis not present

## 2017-10-13 DIAGNOSIS — I10 Essential (primary) hypertension: Secondary | ICD-10-CM

## 2017-10-13 DIAGNOSIS — Z171 Estrogen receptor negative status [ER-]: Secondary | ICD-10-CM | POA: Diagnosis not present

## 2017-10-13 DIAGNOSIS — C7951 Secondary malignant neoplasm of bone: Secondary | ICD-10-CM | POA: Diagnosis not present

## 2017-10-13 DIAGNOSIS — Z79899 Other long term (current) drug therapy: Secondary | ICD-10-CM

## 2017-10-13 LAB — CMP (CANCER CENTER ONLY)
ALT: 19 U/L (ref 0–55)
ANION GAP: 11 (ref 3–11)
AST: 41 U/L — AB (ref 5–34)
Albumin: 3.3 g/dL — ABNORMAL LOW (ref 3.5–5.0)
Alkaline Phosphatase: 216 U/L — ABNORMAL HIGH (ref 40–150)
BILIRUBIN TOTAL: 0.6 mg/dL (ref 0.2–1.2)
BUN: 10 mg/dL (ref 7–26)
CALCIUM: 11.6 mg/dL — AB (ref 8.4–10.4)
CO2: 25 mmol/L (ref 22–29)
Chloride: 100 mmol/L (ref 98–109)
Creatinine: 1.16 mg/dL — ABNORMAL HIGH (ref 0.60–1.10)
GFR, EST NON AFRICAN AMERICAN: 52 mL/min — AB (ref >60)
GFR, Est AFR Am: >60 mL/min (ref >60)
Glucose, Bld: 167 mg/dL — ABNORMAL HIGH (ref 70–140)
POTASSIUM: 3.4 mmol/L — AB (ref 3.5–5.1)
Sodium: 136 mmol/L (ref 136–145)
TOTAL PROTEIN: 7.5 g/dL (ref 6.4–8.3)

## 2017-10-13 LAB — CBC WITH DIFFERENTIAL (CANCER CENTER ONLY)
BASOS PCT: 0 %
Basophils Absolute: 0 10*3/uL (ref 0.0–0.1)
EOS ABS: 0.1 10*3/uL (ref 0.0–0.5)
Eosinophils Relative: 3 %
HCT: 27.5 % — ABNORMAL LOW (ref 34.8–46.6)
HEMOGLOBIN: 8.9 g/dL — AB (ref 11.6–15.9)
Lymphocytes Relative: 30 %
Lymphs Abs: 1.5 10*3/uL (ref 0.9–3.3)
MCH: 27.6 pg (ref 25.1–34.0)
MCHC: 32.4 g/dL (ref 31.5–36.0)
MCV: 85.4 fL (ref 79.5–101.0)
MONO ABS: 0.3 10*3/uL (ref 0.1–0.9)
Monocytes Relative: 7 %
NEUTROS ABS: 3 10*3/uL (ref 1.5–6.5)
Neutrophils Relative %: 60 %
PLATELETS: 257 10*3/uL (ref 145–400)
RBC: 3.22 MIL/uL — AB (ref 3.70–5.45)
RDW: 15.9 % — AB (ref 11.2–14.5)
Smear Review: 1
WBC: 4.9 10*3/uL (ref 3.9–10.3)
nRBC: 1 /100 WBC — ABNORMAL HIGH

## 2017-10-13 NOTE — Progress Notes (Addendum)
  [facility] OFFICE PROGRESS NOTE   Diagnosis: Breast cancer  INTERVAL HISTORY:   Ms. Salem returns as scheduled.  She completed cycle 2 weekly Taxol 10/07/2017.  She also received Zometa due to a markedly elevated calcium level.  She received IV fluids on 10/08/2017.  She denies nausea/vomiting.  No mouth sores.  No constipation or diarrhea.  No numbness or tingling in her hands or feet.  Appetite continues to be improved.  Daughter has not noticed any confusion.  Objective:  Vital signs in last 24 hours:  Blood pressure (!) 167/95, pulse (!) 107, temperature 98.6 F (37 C), temperature source Oral, resp. rate 18, height _0  (1.6 m), weight 173 lb 1.6 oz (78.5 kg), last menstrual period 05/29/2015, SpO2 98 %.    HEENT: No thrush or ulcers. Lymph: Small right supraclavicular lymph node.  Question right axillary/chest wall node. Resp: Lungs clear bilaterally. Cardio: Regular rate and rhythm. GI: Abdomen soft and nontender.  No hepatomegaly. Vascular: No leg edema. Neuro: Alert and oriented. Breasts: Skin thickening, induration right breast.   Lab Results:  Lab Results  Component Value Date   WBC 4.9 10/13/2017   HGB 8.9 (L) 10/13/2017   HCT 27.5 (L) 10/13/2017   MCV 85.4 10/13/2017   PLT 257 10/13/2017   NEUTROABS 3.0 10/13/2017    Imaging:  No results found.  Medications: I have reviewed the patient's current medications.  Assessment/Plan: 1. Metastatic breast cancer  Large right breast mass with diffuse skin thickening  CTs 09/19/2017 with mediastinal/hilar adenopathy, subcarinal adenopathy, widespread bone lesions, right breast mass with skin thickening  MRI of the brain 09/19/2017-negative for brain metastases  Ultrasound-guided biopsy of the right breast mass on 09/21/2017, invasive mammary carcinoma, HER-2 negative, ER negative, PR negative,Ki-67 90%  Weekly Taxol initiated 09/30/2017  Week 2 Taxol 10/07/2017 2.Hypercalcemia, status post  pamidronate 09/17/2017, calcitonin initiated 09/18/2017, Zometa 09/24/2017, Zometa 10/07/2017 3.Dehydration secondary to hypercalcemia, resolved 4.Lytic bone lesions, rib fractures secondary to #1 5.Hypertension 6. Renal failure-? Secondary to ATN, improved 7. [place] hospital admission 09/17/2017-cultures negative, no apparent source for infection, tumor fever? 8. Anemia secondary to metastatic breast cancer and phlebotomy- 1 unit of packed red blood cells 09/30/2017; improved 10/07/2017.    Disposition: Ms. Nordahl appears unchanged.  She has completed 2 treatments with weekly Taxol.  Dr. Benay Spice recommends adding carboplatin, AUC 2, to the treatment regimen beginning 10/14/2017.  We reviewed potential toxicities associated with carboplatin including bone marrow toxicity, nausea, allergic reaction.  She agrees to proceed.  She had a markedly elevated calcium level last week and received Zometa.  The calcium level was improved 10/10/2017, higher today.  She will return for a repeat chemistry panel 10/18/2017.  She will return for lab, follow-up and cycle 2 Taxol/carboplatin 10/28/2017.  She will contact the office in the interim with any problems.  Patient seen with Dr. Benay Spice.    Ned Card ANP/GNP-BC   10/13/2017  11:08 AM This was a shared visit with Ned Card.  Ms. Dwyer has completed 2 weeks of Taxol therapy.  The right breast mass appears slightly smaller, but she has persistent hypercalcemia.  The hypercalcemia persist despite chemotherapy and repeat biphosphonate therapy.  I recommend adding carboplatin.  We reviewed potential toxicities associated with carboplatin.  Ms. Hjort agrees to proceed.  Julieanne Manson, MD

## 2017-10-13 NOTE — Telephone Encounter (Signed)
Added appt per 6/20 los - pt to get an updated schedule in the treatment area.

## 2017-10-14 ENCOUNTER — Other Ambulatory Visit (HOSPITAL_COMMUNITY): Payer: Self-pay | Admitting: Pharmacist

## 2017-10-14 ENCOUNTER — Inpatient Hospital Stay: Payer: Medicare Other

## 2017-10-14 DIAGNOSIS — Z171 Estrogen receptor negative status [ER-]: Principal | ICD-10-CM

## 2017-10-14 DIAGNOSIS — C50811 Malignant neoplasm of overlapping sites of right female breast: Secondary | ICD-10-CM | POA: Diagnosis not present

## 2017-10-14 DIAGNOSIS — Z5111 Encounter for antineoplastic chemotherapy: Secondary | ICD-10-CM | POA: Diagnosis not present

## 2017-10-14 DIAGNOSIS — I10 Essential (primary) hypertension: Secondary | ICD-10-CM | POA: Diagnosis not present

## 2017-10-14 DIAGNOSIS — C7951 Secondary malignant neoplasm of bone: Secondary | ICD-10-CM | POA: Diagnosis not present

## 2017-10-14 DIAGNOSIS — D630 Anemia in neoplastic disease: Secondary | ICD-10-CM | POA: Diagnosis not present

## 2017-10-14 DIAGNOSIS — Z79899 Other long term (current) drug therapy: Secondary | ICD-10-CM | POA: Diagnosis not present

## 2017-10-14 MED ORDER — PALONOSETRON HCL INJECTION 0.25 MG/5ML
INTRAVENOUS | Status: AC
  Filled 2017-10-14: qty 5

## 2017-10-14 MED ORDER — FAMOTIDINE IN NACL 20-0.9 MG/50ML-% IV SOLN
INTRAVENOUS | Status: AC
  Filled 2017-10-14: qty 50

## 2017-10-14 MED ORDER — PALONOSETRON HCL INJECTION 0.25 MG/5ML
0.2500 mg | Freq: Once | INTRAVENOUS | Status: AC
  Administered 2017-10-14: 0.25 mg via INTRAVENOUS

## 2017-10-14 MED ORDER — SODIUM CHLORIDE 0.9 % IV SOLN
201.2000 mg | Freq: Once | INTRAVENOUS | Status: AC
  Administered 2017-10-14: 200 mg via INTRAVENOUS
  Filled 2017-10-14: qty 20

## 2017-10-14 MED ORDER — SODIUM CHLORIDE 0.9 % IV SOLN
80.0000 mg/m2 | Freq: Once | INTRAVENOUS | Status: AC
  Administered 2017-10-14: 156 mg via INTRAVENOUS
  Filled 2017-10-14: qty 26

## 2017-10-14 MED ORDER — DIPHENHYDRAMINE HCL 50 MG/ML IJ SOLN
25.0000 mg | Freq: Once | INTRAMUSCULAR | Status: AC
  Administered 2017-10-14: 25 mg via INTRAVENOUS

## 2017-10-14 MED ORDER — DEXAMETHASONE SODIUM PHOSPHATE 10 MG/ML IJ SOLN
10.0000 mg | Freq: Once | INTRAMUSCULAR | Status: AC
  Administered 2017-10-14: 10 mg via INTRAVENOUS

## 2017-10-14 MED ORDER — FAMOTIDINE IN NACL 20-0.9 MG/50ML-% IV SOLN
20.0000 mg | Freq: Once | INTRAVENOUS | Status: AC
  Administered 2017-10-14: 20 mg via INTRAVENOUS

## 2017-10-14 MED ORDER — DIPHENHYDRAMINE HCL 50 MG/ML IJ SOLN
INTRAMUSCULAR | Status: AC
  Filled 2017-10-14: qty 1

## 2017-10-14 MED ORDER — DEXAMETHASONE SODIUM PHOSPHATE 10 MG/ML IJ SOLN
INTRAMUSCULAR | Status: AC
  Filled 2017-10-14: qty 1

## 2017-10-14 MED ORDER — SODIUM CHLORIDE 0.9 % IV SOLN
Freq: Once | INTRAVENOUS | Status: AC
  Administered 2017-10-14: 10:00:00 via INTRAVENOUS

## 2017-10-14 NOTE — Patient Instructions (Signed)
[facility] Discharge Instructions for Patients Receiving Chemotherapy  Today you received the following chemotherapy agents Taxol and carboplatin  To help prevent nausea and vomiting after your treatment, we encourage you to take your nausea medication as directed   If you develop nausea and vomiting that is not controlled by your nausea medication, call the clinic.   BELOW ARE SYMPTOMS THAT SHOULD BE REPORTED IMMEDIATELY:  *FEVER GREATER THAN 100.5 F  *CHILLS WITH OR WITHOUT FEVER  NAUSEA AND VOMITING THAT IS NOT CONTROLLED WITH YOUR NAUSEA MEDICATION  *UNUSUAL SHORTNESS OF BREATH  *UNUSUAL BRUISING OR BLEEDING  TENDERNESS IN MOUTH AND THROAT WITH OR WITHOUT PRESENCE OF ULCERS  *URINARY PROBLEMS  *BOWEL PROBLEMS  UNUSUAL RASH Items with * indicate a potential emergency and should be followed up as soon as possible.  Feel free to call the clinic should you have any questions or concerns. The clinic phone number is [phone].  Please show the [facility] at check-in to the Emergency Department and triage nurse.   Paclitaxel injection What is this medicine? PACLITAXEL (PAK li TAX el) is a chemotherapy drug. It targets fast dividing cells, like cancer cells, and causes these cells to die. This medicine is used to treat ovarian cancer, breast cancer, and other cancers. This medicine may be used for other purposes; ask your health care provider or pharmacist if you have questions. COMMON BRAND NAME(S): Onxol, Taxol What should I tell my health care provider before I take this medicine? They need to know if you have any of these conditions: -blood disorders -irregular heartbeat -infection (especially a virus infection such as chickenpox, cold sores, or herpes) -liver disease -previous or ongoing radiation therapy -an unusual or allergic reaction to paclitaxel, alcohol, polyoxyethylated castor oil, other chemotherapy agents, other medicines, foods,  dyes, or preservatives -pregnant or trying to get pregnant -breast-feeding How should I use this medicine? This drug is given as an infusion into a vein. It is administered in a hospital or clinic by a specially trained health care professional. Talk to your pediatrician regarding the use of this medicine in children. Special care may be needed. Overdosage: If you think you have taken too much of this medicine contact a poison control center or emergency room at once. NOTE: This medicine is only for you. Do not share this medicine with others. What if I miss a dose? It is important not to miss your dose. Call your doctor or health care professional if you are unable to keep an appointment. What may interact with this medicine? Do not take this medicine with any of the following medications: -disulfiram -metronidazole This medicine may also interact with the following medications: -cyclosporine -diazepam -ketoconazole -medicines to increase blood counts like filgrastim, pegfilgrastim, sargramostim -other chemotherapy drugs like cisplatin, doxorubicin, epirubicin, etoposide, teniposide, vincristine -quinidine -testosterone -vaccines -verapamil Talk to your doctor or health care professional before taking any of these medicines: -acetaminophen -aspirin -ibuprofen -ketoprofen -naproxen This list may not describe all possible interactions. Give your health care provider a list of all the medicines, herbs, non-prescription drugs, or dietary supplements you use. Also tell them if you smoke, drink alcohol, or use illegal drugs. Some items may interact with your medicine. What should I watch for while using this medicine? Your condition will be monitored carefully while you are receiving this medicine. You will need important blood work done while you are taking this medicine. This medicine can cause serious allergic reactions. To reduce your risk you will  need to take other medicine(s)  before treatment with this medicine. If you experience allergic reactions like skin rash, itching or hives, swelling of the face, lips, or tongue, tell your doctor or health care professional right away. In some cases, you may be given additional medicines to help with side effects. Follow all directions for their use. This drug may make you feel generally unwell. This is not uncommon, as chemotherapy can affect healthy cells as well as cancer cells. Report any side effects. Continue your course of treatment even though you feel ill unless your doctor tells you to stop. Call your doctor or health care professional for advice if you get a fever, chills or sore throat, or other symptoms of a cold or flu. Do not treat yourself. This drug decreases your body's ability to fight infections. Try to avoid being around people who are sick. This medicine may increase your risk to bruise or bleed. Call your doctor or health care professional if you notice any unusual bleeding. Be careful brushing and flossing your teeth or using a toothpick because you may get an infection or bleed more easily. If you have any dental work done, tell your dentist you are receiving this medicine. Avoid taking products that contain aspirin, acetaminophen, ibuprofen, naproxen, or ketoprofen unless instructed by your doctor. These medicines may hide a fever. Do not become pregnant while taking this medicine. Women should inform their doctor if they wish to become pregnant or think they might be pregnant. There is a potential for serious side effects to an unborn child. Talk to your health care professional or pharmacist for more information. Do not breast-feed an infant while taking this medicine. Men are advised not to father a child while receiving this medicine. This product may contain alcohol. Ask your pharmacist or healthcare provider if this medicine contains alcohol. Be sure to tell all healthcare providers you are taking this  medicine. Certain medicines, like metronidazole and disulfiram, can cause an unpleasant reaction when taken with alcohol. The reaction includes flushing, headache, nausea, vomiting, sweating, and increased thirst. The reaction can last from 30 minutes to several hours. What side effects may I notice from receiving this medicine? Side effects that you should report to your doctor or health care professional as soon as possible: -allergic reactions like skin rash, itching or hives, swelling of the face, lips, or tongue -low blood counts - This drug may decrease the number of white blood cells, red blood cells and platelets. You may be at increased risk for infections and bleeding. -signs of infection - fever or chills, cough, sore throat, pain or difficulty passing urine -signs of decreased platelets or bleeding - bruising, pinpoint red spots on the skin, black, tarry stools, nosebleeds -signs of decreased red blood cells - unusually weak or tired, fainting spells, lightheadedness -breathing problems -chest pain -high or low blood pressure -mouth sores -nausea and vomiting -pain, swelling, redness or irritation at the injection site -pain, tingling, numbness in the hands or feet -slow or irregular heartbeat -swelling of the ankle, feet, hands Side effects that usually do not require medical attention (report to your doctor or health care professional if they continue or are bothersome): -bone pain -complete hair loss including hair on your head, underarms, pubic hair, eyebrows, and eyelashes -changes in the color of fingernails -diarrhea -loosening of the fingernails -loss of appetite -muscle or joint pain -red flush to skin -sweating This list may not describe all possible side effects. Call your doctor for medical  advice about side effects. You may report side effects to FDA at 1-800-FDA-1088. Where should I keep my medicine? This drug is given in a hospital or clinic and will not be  stored at home. NOTE: This sheet is a summary. It may not cover all possible information. If you have questions about this medicine, talk to your doctor, pharmacist, or health care provider.  2018 Elsevier/Gold Standard (2015-02-11 19:58:00)  Blood Transfusion, Care After This sheet gives you information about how to care for yourself after your procedure. Your doctor may also give you more specific instructions. If you have problems or questions, contact your doctor. Follow these instructions at home:  Take over-the-counter and prescription medicines only as told by your doctor.  Go back to your normal activities as told by your doctor.  Follow instructions from your doctor about how to take care of the area where an IV tube was put into your vein (insertion site). Make sure you: ? Wash your hands with soap and water before you change your bandage (dressing). If there is no soap and water, use hand sanitizer. ? Change your bandage as told by your doctor.  Check your IV insertion site every day for signs of infection. Check for: ? More redness, swelling, or pain. ? More fluid or blood. ? Warmth. ? Pus or a bad smell. Contact a doctor if:  You have more redness, swelling, or pain around the IV insertion site..  You have more fluid or blood coming from the IV insertion site.  Your IV insertion site feels warm to the touch.  You have pus or a bad smell coming from the IV insertion site.  Your pee (urine) turns pink, red, or brown.  You feel weak after doing your normal activities. Get help right away if:  You have signs of a serious allergic or body defense (immune) system reaction, including: ? Itchiness. ? Hives. ? Trouble breathing. ? Anxiety. ? Pain in your chest or lower back. ? Fever, flushing, and chills. ? Fast pulse. ? Rash. ? Watery poop (diarrhea). ? Throwing up (vomiting). ? Dark pee. ? Serious headache. ? Dizziness. ? Stiff neck. ? Yellow color in your  face or the white parts of your eyes (jaundice). Summary  After a blood transfusion, return to your normal activities as told by your doctor.  Every day, check for signs of infection where the IV tube was put into your vein.  Some signs of infection are warm skin, more redness and pain, more fluid or blood, and pus or a bad smell where the needle went in.  Contact your doctor if you feel weak or have any unusual symptoms. This information is not intended to replace advice given to you by your health care provider. Make sure you discuss any questions you have with your health care provider. Document Released: 05/03/2014 Document Revised: 12/05/2015 Document Reviewed: 12/05/2015 Elsevier Interactive Patient Education  2017 [employer].

## 2017-10-18 ENCOUNTER — Inpatient Hospital Stay: Payer: Medicare Other

## 2017-10-18 DIAGNOSIS — Z171 Estrogen receptor negative status [ER-]: Secondary | ICD-10-CM | POA: Diagnosis not present

## 2017-10-18 DIAGNOSIS — C50911 Malignant neoplasm of unspecified site of right female breast: Secondary | ICD-10-CM

## 2017-10-18 DIAGNOSIS — C50811 Malignant neoplasm of overlapping sites of right female breast: Secondary | ICD-10-CM | POA: Diagnosis not present

## 2017-10-18 DIAGNOSIS — Z5111 Encounter for antineoplastic chemotherapy: Secondary | ICD-10-CM | POA: Diagnosis not present

## 2017-10-18 DIAGNOSIS — Z79899 Other long term (current) drug therapy: Secondary | ICD-10-CM | POA: Diagnosis not present

## 2017-10-18 DIAGNOSIS — I10 Essential (primary) hypertension: Secondary | ICD-10-CM | POA: Diagnosis not present

## 2017-10-18 DIAGNOSIS — D630 Anemia in neoplastic disease: Secondary | ICD-10-CM | POA: Diagnosis not present

## 2017-10-18 DIAGNOSIS — C7951 Secondary malignant neoplasm of bone: Secondary | ICD-10-CM | POA: Diagnosis not present

## 2017-10-18 LAB — CMP (CANCER CENTER ONLY)
ALBUMIN: 3.4 g/dL — AB (ref 3.5–5.0)
ALT: 15 U/L (ref 0–44)
AST: 45 U/L — AB (ref 15–41)
Alkaline Phosphatase: 202 U/L — ABNORMAL HIGH (ref 38–126)
Anion gap: 13 (ref 5–15)
BILIRUBIN TOTAL: 0.5 mg/dL (ref 0.3–1.2)
BUN: 11 mg/dL (ref 6–20)
CHLORIDE: 102 mmol/L (ref 98–111)
CO2: 21 mmol/L — ABNORMAL LOW (ref 22–32)
CREATININE: 1.05 mg/dL — AB (ref 0.44–1.00)
Calcium: 11.1 mg/dL — ABNORMAL HIGH (ref 8.9–10.3)
GFR, EST NON AFRICAN AMERICAN: 59 mL/min — AB (ref >60)
GFR, Est AFR Am: >60 mL/min (ref >60)
GLUCOSE: 165 mg/dL — AB (ref 70–99)
Potassium: 3.7 mmol/L (ref 3.5–5.1)
Sodium: 136 mmol/L (ref 135–145)
Total Protein: 7.5 g/dL (ref 6.5–8.1)

## 2017-10-19 DIAGNOSIS — D630 Anemia in neoplastic disease: Secondary | ICD-10-CM | POA: Diagnosis not present

## 2017-10-19 DIAGNOSIS — Z7951 Long term (current) use of inhaled steroids: Secondary | ICD-10-CM | POA: Diagnosis not present

## 2017-10-19 DIAGNOSIS — M8458XD Pathological fracture in neoplastic disease, other specified site, subsequent encounter for fracture with routine healing: Secondary | ICD-10-CM | POA: Diagnosis not present

## 2017-10-19 DIAGNOSIS — M1990 Unspecified osteoarthritis, unspecified site: Secondary | ICD-10-CM | POA: Diagnosis not present

## 2017-10-19 DIAGNOSIS — J9811 Atelectasis: Secondary | ICD-10-CM | POA: Diagnosis not present

## 2017-10-19 DIAGNOSIS — R591 Generalized enlarged lymph nodes: Secondary | ICD-10-CM | POA: Diagnosis not present

## 2017-10-19 DIAGNOSIS — C7951 Secondary malignant neoplasm of bone: Secondary | ICD-10-CM | POA: Diagnosis not present

## 2017-10-19 DIAGNOSIS — G893 Neoplasm related pain (acute) (chronic): Secondary | ICD-10-CM | POA: Diagnosis not present

## 2017-10-19 DIAGNOSIS — C50911 Malignant neoplasm of unspecified site of right female breast: Secondary | ICD-10-CM | POA: Diagnosis not present

## 2017-10-19 DIAGNOSIS — N289 Disorder of kidney and ureter, unspecified: Secondary | ICD-10-CM | POA: Diagnosis not present

## 2017-10-19 DIAGNOSIS — I10 Essential (primary) hypertension: Secondary | ICD-10-CM | POA: Diagnosis not present

## 2017-10-21 ENCOUNTER — Other Ambulatory Visit: Payer: Self-pay | Admitting: Radiology

## 2017-10-21 ENCOUNTER — Telehealth: Payer: Self-pay | Admitting: Emergency Medicine

## 2017-10-21 DIAGNOSIS — Z7951 Long term (current) use of inhaled steroids: Secondary | ICD-10-CM | POA: Diagnosis not present

## 2017-10-21 DIAGNOSIS — C50911 Malignant neoplasm of unspecified site of right female breast: Secondary | ICD-10-CM | POA: Diagnosis not present

## 2017-10-21 DIAGNOSIS — M1990 Unspecified osteoarthritis, unspecified site: Secondary | ICD-10-CM | POA: Diagnosis not present

## 2017-10-21 DIAGNOSIS — J9811 Atelectasis: Secondary | ICD-10-CM | POA: Diagnosis not present

## 2017-10-21 DIAGNOSIS — N289 Disorder of kidney and ureter, unspecified: Secondary | ICD-10-CM | POA: Diagnosis not present

## 2017-10-21 DIAGNOSIS — G893 Neoplasm related pain (acute) (chronic): Secondary | ICD-10-CM | POA: Diagnosis not present

## 2017-10-21 DIAGNOSIS — D630 Anemia in neoplastic disease: Secondary | ICD-10-CM | POA: Diagnosis not present

## 2017-10-21 DIAGNOSIS — C7951 Secondary malignant neoplasm of bone: Secondary | ICD-10-CM | POA: Diagnosis not present

## 2017-10-21 DIAGNOSIS — I10 Essential (primary) hypertension: Secondary | ICD-10-CM | POA: Diagnosis not present

## 2017-10-21 DIAGNOSIS — M8458XD Pathological fracture in neoplastic disease, other specified site, subsequent encounter for fracture with routine healing: Secondary | ICD-10-CM | POA: Diagnosis not present

## 2017-10-21 DIAGNOSIS — R591 Generalized enlarged lymph nodes: Secondary | ICD-10-CM | POA: Diagnosis not present

## 2017-10-21 NOTE — Telephone Encounter (Addendum)
Pt voiced understanding   ----- Message from Owens Shark, NP sent at 10/21/2017  8:51 AM EDT ----- Please let her know the calcium is better.  Follow-up as scheduled.

## 2017-10-23 ENCOUNTER — Other Ambulatory Visit: Payer: Self-pay | Admitting: Oncology

## 2017-10-25 ENCOUNTER — Other Ambulatory Visit: Payer: Self-pay | Admitting: Oncology

## 2017-10-25 ENCOUNTER — Ambulatory Visit (HOSPITAL_COMMUNITY)
Admission: RE | Admit: 2017-10-25 | Discharge: 2017-10-25 | Disposition: A | Discharge status: Home / Self Care | Payer: Medicare Other | Source: Ambulatory Visit | Attending: Oncology | Admitting: Oncology

## 2017-10-25 ENCOUNTER — Encounter (HOSPITAL_COMMUNITY): Payer: Self-pay

## 2017-10-25 DIAGNOSIS — C50919 Malignant neoplasm of unspecified site of unspecified female breast: Secondary | ICD-10-CM | POA: Diagnosis not present

## 2017-10-25 DIAGNOSIS — M1990 Unspecified osteoarthritis, unspecified site: Secondary | ICD-10-CM | POA: Diagnosis not present

## 2017-10-25 DIAGNOSIS — I10 Essential (primary) hypertension: Secondary | ICD-10-CM | POA: Diagnosis not present

## 2017-10-25 DIAGNOSIS — C50811 Malignant neoplasm of overlapping sites of right female breast: Secondary | ICD-10-CM

## 2017-10-25 DIAGNOSIS — Z452 Encounter for adjustment and management of vascular access device: Secondary | ICD-10-CM | POA: Diagnosis not present

## 2017-10-25 DIAGNOSIS — Z5111 Encounter for antineoplastic chemotherapy: Secondary | ICD-10-CM | POA: Diagnosis not present

## 2017-10-25 DIAGNOSIS — Z171 Estrogen receptor negative status [ER-]: Principal | ICD-10-CM

## 2017-10-25 DIAGNOSIS — D0591 Unspecified type of carcinoma in situ of right breast: Secondary | ICD-10-CM | POA: Insufficient documentation

## 2017-10-25 DIAGNOSIS — Z7951 Long term (current) use of inhaled steroids: Secondary | ICD-10-CM | POA: Insufficient documentation

## 2017-10-25 HISTORY — PX: IR IMAGING GUIDED PORT INSERTION: IMG5740

## 2017-10-25 LAB — COMPREHENSIVE METABOLIC PANEL
ALBUMIN: 3.7 g/dL (ref 3.5–5.0)
ALK PHOS: 361 U/L — AB (ref 38–126)
ALT: 15 U/L (ref 0–44)
ANION GAP: 13 (ref 5–15)
AST: 21 U/L (ref 15–41)
BILIRUBIN TOTAL: 0.7 mg/dL (ref 0.3–1.2)
BUN: 6 mg/dL (ref 6–20)
CALCIUM: 8.6 mg/dL — AB (ref 8.9–10.3)
CO2: 20 mmol/L — ABNORMAL LOW (ref 22–32)
Chloride: 106 mmol/L (ref 98–111)
Creatinine, Ser: 0.92 mg/dL (ref 0.44–1.00)
GFR calc Af Amer: >60 mL/min (ref >60)
GFR calc non Af Amer: >60 mL/min (ref >60)
GLUCOSE: 129 mg/dL — AB (ref 70–99)
Potassium: 3.8 mmol/L (ref 3.5–5.1)
Sodium: 139 mmol/L (ref 135–145)
Total Protein: 7.6 g/dL (ref 6.5–8.1)

## 2017-10-25 LAB — CBC
HCT: 25.6 % — ABNORMAL LOW (ref 36.0–46.0)
Hemoglobin: 8 g/dL — ABNORMAL LOW (ref 12.0–15.0)
MCH: 27.2 pg (ref 26.0–34.0)
MCHC: 31.3 g/dL (ref 30.0–36.0)
MCV: 87.1 fL (ref 78.0–100.0)
Platelets: 382 10*3/uL (ref 150–400)
RBC: 2.94 MIL/uL — AB (ref 3.87–5.11)
RDW: 17 % — AB (ref 11.5–15.5)
WBC: 6.9 10*3/uL (ref 4.0–10.5)

## 2017-10-25 LAB — PROTIME-INR
INR: 1.2
PROTHROMBIN TIME: 15.1 s (ref 11.4–15.2)

## 2017-10-25 LAB — APTT: aPTT: 29 seconds (ref 24–36)

## 2017-10-25 MED ORDER — FENTANYL CITRATE (PF) 100 MCG/2ML IJ SOLN
INTRAMUSCULAR | Status: AC
  Filled 2017-10-25: qty 2

## 2017-10-25 MED ORDER — MIDAZOLAM HCL 2 MG/2ML IJ SOLN
INTRAMUSCULAR | Status: DC | PRN
  Administered 2017-10-25 (×4): 1 mg via INTRAVENOUS

## 2017-10-25 MED ORDER — LIDOCAINE HCL (PF) 1 % IJ SOLN
INTRAMUSCULAR | Status: DC | PRN
  Administered 2017-10-25: 20 mL
  Administered 2017-10-25: 5 mL

## 2017-10-25 MED ORDER — HEPARIN SOD (PORK) LOCK FLUSH 100 UNIT/ML IV SOLN
INTRAVENOUS | Status: DC | PRN
  Administered 2017-10-25: 500 [IU] via INTRAVENOUS

## 2017-10-25 MED ORDER — CEFAZOLIN SODIUM-DEXTROSE 2-4 GM/100ML-% IV SOLN
2.0000 g | Freq: Once | INTRAVENOUS | Status: AC
  Administered 2017-10-25: 2 g via INTRAVENOUS

## 2017-10-25 MED ORDER — HEPARIN SOD (PORK) LOCK FLUSH 100 UNIT/ML IV SOLN
INTRAVENOUS | Status: AC
  Filled 2017-10-25: qty 5

## 2017-10-25 MED ORDER — LIDOCAINE HCL 1 % IJ SOLN
INTRAMUSCULAR | Status: AC
  Filled 2017-10-25: qty 20

## 2017-10-25 MED ORDER — MIDAZOLAM HCL 2 MG/2ML IJ SOLN
INTRAMUSCULAR | Status: AC
  Filled 2017-10-25: qty 4

## 2017-10-25 MED ORDER — FENTANYL CITRATE (PF) 100 MCG/2ML IJ SOLN
INTRAMUSCULAR | Status: DC | PRN
  Administered 2017-10-25 (×2): 50 ug via INTRAVENOUS

## 2017-10-25 MED ORDER — CEFAZOLIN SODIUM-DEXTROSE 2-4 GM/100ML-% IV SOLN
INTRAVENOUS | Status: AC
  Filled 2017-10-25: qty 100

## 2017-10-25 NOTE — Procedures (Signed)
RIJV PAC SVC RA EBL 0 Comp 0 

## 2017-10-25 NOTE — Consult Note (Signed)
Chief Complaint: Patient was seen in consultation today for Port-A-Cath placement  Referring Physician(s): Sherrill,Gary B  Supervising Physician: Marybelle Killings  Patient Status: [place]  History of Present Illness: Olivia Patel is a 57 y.o. female with history of recently diagnosed metastatic right breast carcinoma ,currently undergoing chemotherapy, who presents today for Port-A-Cath placement.  Past Medical History:  Diagnosis Date  . Anemia   . Arthritis   . Hypertension     Past Surgical History:  Procedure Laterality Date  . TUBAL LIGATION      Allergies: Patient has no known allergies.  Medications: Prior to Admission medications   Medication Sig Start Date End Date Taking? Authorizing Provider  labetalol (NORMODYNE) 300 MG tablet Take 1 tablet (300 mg total) by mouth 2 (two) times daily. 09/26/17  Yes Bonnielee Haff, MD  potassium chloride 20 MEQ TBCR Take 20 mEq by mouth daily. 09/26/17  Yes Bonnielee Haff, MD  amLODipine (NORVASC) 10 MG tablet Take 1 tablet (10 mg total) by mouth daily. 09/26/17   Bonnielee Haff, MD  dexamethasone (DECADRON) 4 MG tablet Take 20mg  (5 tablets) at 10pm the night before 1st chemo treatment. Take 10mg  (2.5 tablets) at 6am the morning of the first chemo treatment. Patient not taking: Reported on 10/13/2017 09/28/17   Ladell Pier, MD  fluticasone [facility]) 50 MCG/ACT nasal spray Place 2 sprays into both nostrils daily. 09/26/17   Bonnielee Haff, MD  HYDROcodone-acetaminophen (NORCO/VICODIN) 5-325 MG tablet Take 1 tablet by mouth every 6 (six) hours as needed for severe pain. 09/26/17   Bonnielee Haff, MD  Multiple Vitamin (MULTIVITAMIN WITH MINERALS) TABS tablet Take 1 tablet by mouth daily. 09/26/17   Bonnielee Haff, MD  prochlorperazine (COMPAZINE) 10 MG tablet Take 1 tablet (10 mg total) by mouth every 6 (six) hours as needed for nausea or vomiting. Patient not taking: Reported on 10/07/2017 09/30/17   Sandi Mealy E., PA-C    senna-docusate (SENOKOT-S) 8.6-50 MG tablet Take 1 tablet by mouth at bedtime as needed for mild constipation. 09/26/17   Bonnielee Haff, MD     Family History  Problem Relation Age of Onset  . Hypertension Mother   . Diabetes Mother   . Diabetes Sister   . Hypertension Father   . Kidney failure Father     Social History   Socioeconomic History  . Marital status: Single    Spouse name: Not on file  . Number of children: 6  . Years of education: 52  . Highest education level: Not on file  Occupational History  . Occupation: Disability  Social Needs  . Financial resource strain: Not on file  . Food insecurity:    Worry: Not on file    Inability: Not on file  . Transportation needs:    Medical: Not on file    Non-medical: Not on file  Tobacco Use  . Smoking status: Never Smoker  . Smokeless tobacco: Never Used  Substance and Sexual Activity  . Alcohol use: No    Alcohol/week: 0.0 oz  . Drug use: No  . Sexual activity: Never    Partners: Male  Lifestyle  . Physical activity:    Days per week: Not on file    Minutes per session: Not on file  . Stress: Not on file  Relationships  . Social connections:    Talks on phone: Not on file    Gets together: Not on file    Attends religious service: Not on file  Active member of club or organization: Not on file    Attends meetings of clubs or organizations: Not on file    Relationship status: Not on file  Other Topics Concern  . Not on file  Social History Narrative  . Not on file      Review of Systems currently denies fever, headache, chest pain, dyspnea, abdominal pain, nausea, vomiting or bleeding.  She does have some intermittent back pain and occasional cough.  Vital Signs: BP (!) 148/83   Pulse (!) 110   Temp 98.3 F (36.8 C) (Oral)   Resp 18   LMP 05/29/2015 Comment: ligation  SpO2 100%   Physical Exam awake, alert.  Chest with slightly diminished breath sounds left base, right clear.  Heart with  regular rate and rhythm.  Abdomen soft, positive bowel sounds, nontender.  No lower extremity edema.  Imaging: No results found.  Labs:  CBC: Recent Labs    10/07/17 1540 10/10/17 0807 10/13/17 1015 10/25/17 0751  WBC 7.3 5.6 4.9 6.9  HGB 9.8* 8.8* 8.9* 8.0*  HCT 29.7* 27.0* 27.5* 25.6*  PLT 323 219 257 382    COAGS: Recent Labs    09/21/17 0405 10/25/17 0751  INR 1.39 1.20  APTT  --  29    BMP: Recent Labs    10/10/17 0807 10/13/17 1015 10/18/17 0816 10/25/17 0751  NA 136 136 136 139  K 3.6 3.4* 3.7 3.8  CL 99 100 102 106  CO2 25 25 21* 20*  GLUCOSE 170* 167* 165* 129*  BUN 18 10 11 6   CALCIUM 10.6* 11.6* 11.1* 8.6*  CREATININE 1.16* 1.16* 1.05* 0.92  GFRNONAA 52* 52* 59* >60  GFRAA >60 >60 >60 >60    LIVER FUNCTION TESTS: Recent Labs    10/10/17 0807 10/13/17 1015 10/18/17 0816 10/25/17 0751  BILITOT 0.5 0.6 0.5 0.7  AST 65* 41* 45* 21  ALT 21 19 15 15   ALKPHOS 177* 216* 202* 361*  PROT 6.9 7.5 7.5 7.6  ALBUMIN 3.2* 3.3* 3.4* 3.7    TUMOR MARKERS: No results for input(s): AFPTM, CEA, CA199, CHROMGRNA in the last 8760 hours.  Assessment and Plan: 57 y.o. female with history of recently diagnosed metastatic right breast carcinoma ,currently undergoing chemotherapy, who presents today for Port-A-Cath placement. Risks and benefits of image guided port-a-catheter placement was discussed with the patient/daughter including, but not limited to bleeding, infection, pneumothorax, or fibrin sheath development and need for additional procedures.  All of the patient's questions were answered, patient is agreeable to proceed. Consent signed and in chart.     Thank you for this interesting consult.  I greatly enjoyed meeting Olivia Patel and look forward to participating in their care.  A copy of this report was sent to the requesting provider on this date.  Electronically Signed: D. Rowe Robert, PA-C 10/25/2017, 9:09 AM   I spent a total of 25  minutes  in face to face in clinical consultation, greater than 50% of which was counseling/coordinating care for Port-A-Cath placement

## 2017-10-25 NOTE — Discharge Instructions (Signed)
Implanted Port Home Guide °An implanted port is a type of central line that is placed under the skin. Central lines are used to provide IV access when treatment or nutrition needs to be given through a person’s veins. Implanted ports are used for long-term IV access. An implanted port may be placed because: °· You need IV medicine that would be irritating to the small veins in your hands or arms. °· You need long-term IV medicines, such as antibiotics. °· You need IV nutrition for a long period. °· You need frequent blood draws for lab tests. °· You need dialysis. ° °Implanted ports are usually placed in the chest area, but they can also be placed in the upper arm, the abdomen, or the leg. An implanted port has two main parts: °· Reservoir. The reservoir is round and will appear as a small, raised area under your skin. The reservoir is the part where a needle is inserted to give medicines or draw blood. °· Catheter. The catheter is a thin, flexible tube that extends from the reservoir. The catheter is placed into a large vein. Medicine that is inserted into the reservoir goes into the catheter and then into the vein. ° °How will I care for my incision site? °Do not get the incision site wet. Bathe or shower as directed by your health care provider. °How is my port accessed? °Special steps must be taken to access the port: °· Before the port is accessed, a numbing cream can be placed on the skin. This helps numb the skin over the port site. °· Your health care provider uses a sterile technique to access the port. °? Your health care provider must put on a mask and sterile gloves. °? The skin over your port is cleaned carefully with an antiseptic and allowed to dry. °? The port is gently pinched between sterile gloves, and a needle is inserted into the port. °· Only "non-coring" port needles should be used to access the port. Once the port is accessed, a blood return should be checked. This helps ensure that the port  is in the vein and is not clogged. °· If your port needs to remain accessed for a constant infusion, a clear (transparent) bandage will be placed over the needle site. The bandage and needle will need to be changed every week, or as directed by your health care provider. °· Keep the bandage covering the needle clean and dry. Do not get it wet. Follow your health care provider’s instructions on how to take a shower or bath while the port is accessed. °· If your port does not need to stay accessed, no bandage is needed over the port. ° °What is flushing? °Flushing helps keep the port from getting clogged. Follow your health care provider’s instructions on how and when to flush the port. Ports are usually flushed with saline solution or a medicine called heparin. The need for flushing will depend on how the port is used. °· If the port is used for intermittent medicines or blood draws, the port will need to be flushed: °? After medicines have been given. °? After blood has been drawn. °? As part of routine maintenance. °· If a constant infusion is running, the port may not need to be flushed. ° °How long will my port stay implanted? °The port can stay in for as long as your health care provider thinks it is needed. When it is time for the port to come out, surgery will be   done to remove it. The procedure is similar to the one performed when the port was put in. When should I seek immediate medical care? When you have an implanted port, you should seek immediate medical care if:  You notice a bad smell coming from the incision site.  You have swelling, redness, or drainage at the incision site.  You have more swelling or pain at the port site or the surrounding area.  You have a fever that is not controlled with medicine.  This information is not intended to replace advice given to you by your health care provider. Make sure you discuss any questions you have with your health care provider. Document  Released: 04/12/2005 Document Revised: 09/18/2015 Document Reviewed: 12/18/2012 Elsevier Interactive Patient Education  2017 [place] Insertion, Care After This sheet gives you information about how to care for yourself after your procedure. Your health care provider may also give you more specific instructions. If you have problems or questions, contact your health care provider. What can I expect after the procedure? After your procedure, it is common to have:  Discomfort at the port insertion site.  Bruising on the skin over the port. This should improve over 3-4 days.  Follow these instructions at home: [facility] care  After your port is placed, you will get a manufacturer's information card. The card has information about your port. Keep this card with you at all times.  Take care of the port as told by your health care provider. Ask your health care provider if you or a family member can get training for taking care of the port at home. A home health care nurse may also take care of the port.  Make sure to remember what type of port you have. Incision care  Follow instructions from your health care provider about how to take care of your port insertion site. Make sure you: ? Wash your hands with soap and water before you change your bandage (dressing). If soap and water are not available, use hand sanitizer. ? Change your dressing as told by your health care provider. ? Leave stitches (sutures), skin glue, or adhesive strips in place. These skin closures may need to stay in place for 2 weeks or longer. If adhesive strip edges start to loosen and curl up, you may trim the loose edges. Do not remove adhesive strips completely unless your health care provider tells you to do that.  Check your port insertion site every day for signs of infection. Check for: ? More redness, swelling, or pain. ? More fluid or blood. ? Warmth. ? Pus or a bad smell. General  instructions  Do not take baths, swim, or use a hot tub until your health care provider approves.  Do not lift anything that is heavier than 10 lb (4.5 kg) for a week, or as told by your health care provider.  Ask your health care provider when it is okay to: ? Return to work or school. ? Resume usual physical activities or sports.  Do not drive for 24 hours if you were given a medicine to help you relax (sedative).  Take over-the-counter and prescription medicines only as told by your health care provider.  Wear a medical alert bracelet in case of an emergency. This will tell any health care providers that you have a port.  Keep all follow-up visits as told by your health care provider. This is important. Contact a health care provider if:  You cannot flush your port with saline as directed, or you cannot draw blood from the port.  You have a fever or chills.  You have more redness, swelling, or pain around your port insertion site.  You have more fluid or blood coming from your port insertion site.  Your port insertion site feels warm to the touch.  You have pus or a bad smell coming from the port insertion site. Get help right away if:  You have chest pain or shortness of breath.  You have bleeding from your port that you cannot control. Summary  Take care of the port as told by your health care provider.  Change your dressing as told by your health care provider.  Keep all follow-up visits as told by your health care provider. This information is not intended to replace advice given to you by your health care provider. Make sure you discuss any questions you have with your health care provider. Document Released: 01/31/2013 Document Revised: 03/03/2016 Document Reviewed: 03/03/2016 Elsevier Interactive Patient Education  2017 [place].  Moderate Conscious Sedation, Adult, Care After These instructions provide you with information about caring for yourself after  your procedure. Your health care provider may also give you more specific instructions. Your treatment has been planned according to current medical practices, but problems sometimes occur. Call your health care provider if you have any problems or questions after your procedure. What can I expect after the procedure? After your procedure, it is common:  To feel sleepy for several hours.  To feel clumsy and have poor balance for several hours.  To have poor judgment for several hours.  To vomit if you eat too soon.  Follow these instructions at home: For at least 24 hours after the procedure:   Do not: ? Participate in activities where you could fall or become injured. ? Drive. ? Use heavy machinery. ? Drink alcohol. ? Take sleeping pills or medicines that cause drowsiness. ? Make important decisions or sign legal documents. ? Take care of children on your own.  Rest. Eating and drinking  Follow the diet recommended by your health care provider.  If you vomit: ? Drink water, juice, or soup when you can drink without vomiting. ? Make sure you have little or no nausea before eating solid foods. General instructions  Have a responsible adult stay with you until you are awake and alert.  Take over-the-counter and prescription medicines only as told by your health care provider.  If you smoke, do not smoke without supervision.  Keep all follow-up visits as told by your health care provider. This is important. Contact a health care provider if:  You keep feeling nauseous or you keep vomiting.  You feel light-headed.  You develop a rash.  You have a fever. Get help right away if:  You have trouble breathing. This information is not intended to replace advice given to you by your health care provider. Make sure you discuss any questions you have with your health care provider. Document Released: 01/31/2013 Document Revised: 09/15/2015 Document Reviewed:  08/02/2015 Elsevier Interactive Patient Education  [employer].

## 2017-10-28 ENCOUNTER — Telehealth: Payer: Self-pay | Admitting: Nurse Practitioner

## 2017-10-28 ENCOUNTER — Inpatient Hospital Stay (HOSPITAL_BASED_OUTPATIENT_CLINIC_OR_DEPARTMENT_OTHER): Payer: Medicare Other | Admitting: Nurse Practitioner

## 2017-10-28 ENCOUNTER — Inpatient Hospital Stay: Payer: Medicare Other

## 2017-10-28 ENCOUNTER — Inpatient Hospital Stay: Payer: Medicare Other | Attending: Oncology

## 2017-10-28 ENCOUNTER — Encounter: Payer: Self-pay | Admitting: Nurse Practitioner

## 2017-10-28 VITALS — BP 173/99 | HR 117 | Temp 98.5°F | Resp 18 | Ht 63.0 in | Wt 170.0 lb

## 2017-10-28 VITALS — BP 151/88 | HR 99

## 2017-10-28 DIAGNOSIS — I10 Essential (primary) hypertension: Secondary | ICD-10-CM | POA: Insufficient documentation

## 2017-10-28 DIAGNOSIS — Z171 Estrogen receptor negative status [ER-]: Secondary | ICD-10-CM

## 2017-10-28 DIAGNOSIS — C50811 Malignant neoplasm of overlapping sites of right female breast: Secondary | ICD-10-CM

## 2017-10-28 DIAGNOSIS — C7951 Secondary malignant neoplasm of bone: Secondary | ICD-10-CM

## 2017-10-28 DIAGNOSIS — Z5111 Encounter for antineoplastic chemotherapy: Secondary | ICD-10-CM | POA: Insufficient documentation

## 2017-10-28 DIAGNOSIS — D649 Anemia, unspecified: Secondary | ICD-10-CM | POA: Diagnosis not present

## 2017-10-28 DIAGNOSIS — Z79899 Other long term (current) drug therapy: Secondary | ICD-10-CM | POA: Diagnosis not present

## 2017-10-28 DIAGNOSIS — L858 Other specified epidermal thickening: Secondary | ICD-10-CM | POA: Insufficient documentation

## 2017-10-28 DIAGNOSIS — C50911 Malignant neoplasm of unspecified site of right female breast: Secondary | ICD-10-CM

## 2017-10-28 DIAGNOSIS — Z95828 Presence of other vascular implants and grafts: Secondary | ICD-10-CM | POA: Insufficient documentation

## 2017-10-28 LAB — CBC WITH DIFFERENTIAL (CANCER CENTER ONLY)
Basophils Absolute: 0 10*3/uL (ref 0.0–0.1)
Basophils Relative: 0 %
Eosinophils Absolute: 0.2 10*3/uL (ref 0.0–0.5)
Eosinophils Relative: 3 %
HEMATOCRIT: 24.3 % — AB (ref 34.8–46.6)
HEMOGLOBIN: 7.8 g/dL — AB (ref 11.6–15.9)
LYMPHS PCT: 22 %
Lymphs Abs: 1.7 10*3/uL (ref 0.9–3.3)
MCH: 27.8 pg (ref 25.1–34.0)
MCHC: 32.1 g/dL (ref 31.5–36.0)
MCV: 86.5 fL (ref 79.5–101.0)
MONO ABS: 0.8 10*3/uL (ref 0.1–0.9)
MONOS PCT: 11 %
NEUTROS ABS: 4.8 10*3/uL (ref 1.5–6.5)
Neutrophils Relative %: 64 %
Platelet Count: 328 10*3/uL (ref 145–400)
RBC: 2.81 MIL/uL — ABNORMAL LOW (ref 3.70–5.45)
RDW: 17.7 % — ABNORMAL HIGH (ref 11.2–14.5)
WBC Count: 7.5 10*3/uL (ref 3.9–10.3)
nRBC: 2 /100 WBC — ABNORMAL HIGH

## 2017-10-28 MED ORDER — SODIUM CHLORIDE 0.9% FLUSH
10.0000 mL | Freq: Once | INTRAVENOUS | Status: AC
  Administered 2017-10-28: 10 mL
  Filled 2017-10-28: qty 10

## 2017-10-28 MED ORDER — DEXAMETHASONE SODIUM PHOSPHATE 10 MG/ML IJ SOLN
INTRAMUSCULAR | Status: AC
  Filled 2017-10-28: qty 1

## 2017-10-28 MED ORDER — PALONOSETRON HCL INJECTION 0.25 MG/5ML
0.2500 mg | Freq: Once | INTRAVENOUS | Status: AC
  Administered 2017-10-28: 0.25 mg via INTRAVENOUS

## 2017-10-28 MED ORDER — SODIUM CHLORIDE 0.9% FLUSH
10.0000 mL | INTRAVENOUS | Status: DC | PRN
  Administered 2017-10-28: 10 mL
  Filled 2017-10-28: qty 10

## 2017-10-28 MED ORDER — SODIUM CHLORIDE 0.9 % IV SOLN
80.0000 mg/m2 | Freq: Once | INTRAVENOUS | Status: AC
  Administered 2017-10-28: 156 mg via INTRAVENOUS
  Filled 2017-10-28: qty 26

## 2017-10-28 MED ORDER — SODIUM CHLORIDE 0.9 % IV SOLN
Freq: Once | INTRAVENOUS | Status: AC
  Administered 2017-10-28: 15:00:00 via INTRAVENOUS

## 2017-10-28 MED ORDER — PALONOSETRON HCL INJECTION 0.25 MG/5ML
INTRAVENOUS | Status: AC
  Filled 2017-10-28: qty 5

## 2017-10-28 MED ORDER — DIPHENHYDRAMINE HCL 50 MG/ML IJ SOLN
25.0000 mg | Freq: Once | INTRAMUSCULAR | Status: AC
  Administered 2017-10-28: 25 mg via INTRAVENOUS

## 2017-10-28 MED ORDER — SODIUM CHLORIDE 0.9 % IV SOLN
240.6000 mg | Freq: Once | INTRAVENOUS | Status: AC
  Administered 2017-10-28: 240 mg via INTRAVENOUS
  Filled 2017-10-28: qty 24

## 2017-10-28 MED ORDER — HEPARIN SOD (PORK) LOCK FLUSH 100 UNIT/ML IV SOLN
500.0000 [IU] | Freq: Once | INTRAVENOUS | Status: AC | PRN
  Administered 2017-10-28: 500 [IU]
  Filled 2017-10-28: qty 5

## 2017-10-28 MED ORDER — DEXAMETHASONE SODIUM PHOSPHATE 10 MG/ML IJ SOLN
10.0000 mg | Freq: Once | INTRAMUSCULAR | Status: AC
  Administered 2017-10-28: 10 mg via INTRAVENOUS

## 2017-10-28 MED ORDER — FAMOTIDINE IN NACL 20-0.9 MG/50ML-% IV SOLN
INTRAVENOUS | Status: AC
  Filled 2017-10-28: qty 50

## 2017-10-28 MED ORDER — DIPHENHYDRAMINE HCL 50 MG/ML IJ SOLN
INTRAMUSCULAR | Status: AC
  Filled 2017-10-28: qty 1

## 2017-10-28 MED ORDER — FAMOTIDINE IN NACL 20-0.9 MG/50ML-% IV SOLN
20.0000 mg | Freq: Once | INTRAVENOUS | Status: AC
  Administered 2017-10-28: 20 mg via INTRAVENOUS

## 2017-10-28 MED ORDER — POTASSIUM CHLORIDE ER 20 MEQ PO TBCR: 20.0000 meq | EXTENDED_RELEASE_TABLET | Freq: Every day | ORAL | 0 refills | Status: DC

## 2017-10-28 MED ORDER — LIDOCAINE-PRILOCAINE 2.5-2.5 % EX CREA: TOPICAL_CREAM | CUTANEOUS | 2 refills | Status: AC

## 2017-10-28 NOTE — Progress Notes (Addendum)
  [place] OFFICE PROGRESS NOTE   Diagnosis: Breast cancer  INTERVAL HISTORY:   Olivia Patel returns as scheduled.  She completed week 3 Taxol, carboplatin added, 10/14/2017.  She denies nausea/vomiting.  No mouth sores.  No diarrhea.  No rash.  No signs of allergic reaction.  No numbness or tingling in her hands or feet.  Only complaint today is reflux.  Objective:  Vital signs in last 24 hours:  Blood pressure (!) 173/99, pulse (!) 117, temperature 98.5 F (36.9 C), temperature source Oral, resp. rate 18, height _0  (1.6 m), weight 170 lb (77.1 kg), last menstrual period 05/29/2015, SpO2 100 %.    HEENT: No thrush or ulcers. Resp: Lungs clear bilaterally. Cardio: Regular rate and rhythm. GI: Abdomen soft and nontender.  No hepatomegaly. Vascular: No leg edema. Neuro: Alert and oriented. Skin: No rash. Breast: Skin thickening, induration right breast.  Breast appears less edematous and less erythematous.   Lab Results:  Lab Results  Component Value Date   WBC 6.9 10/25/2017   HGB 8.0 (L) 10/25/2017   HCT 25.6 (L) 10/25/2017   MCV 87.1 10/25/2017   PLT 382 10/25/2017   NEUTROABS 3.0 10/13/2017    Imaging:  No results found.  Medications: I have reviewed the patient's current medications.  Assessment/Plan: 1. Metastatic breast cancer  Large right breast mass with diffuse skin thickening  CTs 09/19/2017 with mediastinal/hilar adenopathy, subcarinal adenopathy, widespread bone lesions, right breast mass with skin thickening  MRI of the brain 09/19/2017-negative for brain metastases  Ultrasound-guided biopsy of the right breast mass on 09/21/2017, invasive mammary carcinoma, HER-2 negative, ER negative, PR negative,Ki-67 90%  Weekly Taxol initiated 09/30/2017  Week 2 Taxol 10/07/2017  Week 3 Taxol/carboplatin added 10/14/2017  Cycle 2 weekly Taxol/carboplatin beginning 10/28/2017 2.Hypercalcemia, status post pamidronate 09/17/2017, calcitonin  initiated 09/18/2017, Zometa 09/24/2017,Zometa 10/07/2017 3.Dehydration secondary to hypercalcemia, resolved 4.Lytic bone lesions, rib fractures secondary to #1 5.Hypertension 6. Renal failure-? Secondary to ATN, improved 7. [place] hospital admission 09/17/2017-cultures negative, no apparent source for infection, tumor fever? 8. Anemia secondary to metastatic breast cancer and phlebotomy- 1 unit of packed red blood cells 09/30/2017;improved 10/07/2017.    Disposition: [name] appears stable.  She has completed 1 cycle of weekly Taxol (3 weeks on/1 week off).  Carboplatin was added with the week 3 treatment.  She seems to be tolerating the chemotherapy well.  Plan to proceed with cycle 2 weekly Taxol/carboplatin today as scheduled pending CBC results.  She had labs done on 10/25/2017.  The calcium level was significantly improved.  We will continue to monitor.  She will return for lab, follow-up and cycle 2-week 2 Taxol/carboplatin on 11/04/2017.  She will contact the office in the interim with any problems.  Patient seen with Dr. Benay Spice.    Ned Card ANP/GNP-BC   10/28/2017  2:39 PM  This was a shared visit with Ned Card.  Ms. Tokarczyk was interviewed and examined.  The breast mass appears smaller.  The hypercalcemia has improved.  She appears to be responding to the Taxol/carboplatin.  The plan is to continue weekly Taxol/carboplatin beginning with another cycle today.  Julieanne Manson, MD

## 2017-10-28 NOTE — Progress Notes (Signed)
Per Ned Card, NP okay to tx with CMP from 10/25/17. NP aware of Hgb 7.8. Pt asymptomatic. No blood transfusion ordered.

## 2017-10-28 NOTE — Patient Instructions (Signed)
[facility] Discharge Instructions for Patients Receiving Chemotherapy  Today you received the following chemotherapy agents paclitaxel (Taxol) and carboplatin (Paraplatin)  To help prevent nausea and vomiting after your treatment, we encourage you to take your nausea medication as directed   If you develop nausea and vomiting that is not controlled by your nausea medication, call the clinic.   BELOW ARE SYMPTOMS THAT SHOULD BE REPORTED IMMEDIATELY:  *FEVER GREATER THAN 100.5 F  *CHILLS WITH OR WITHOUT FEVER  NAUSEA AND VOMITING THAT IS NOT CONTROLLED WITH YOUR NAUSEA MEDICATION  *UNUSUAL SHORTNESS OF BREATH  *UNUSUAL BRUISING OR BLEEDING  TENDERNESS IN MOUTH AND THROAT WITH OR WITHOUT PRESENCE OF ULCERS  *URINARY PROBLEMS  *BOWEL PROBLEMS  UNUSUAL RASH Items with * indicate a potential emergency and should be followed up as soon as possible.  Feel free to call the clinic should you have any questions or concerns. The clinic phone number is [phone].  Please show the [place] at check-in to the Emergency Department and triage nurse.

## 2017-10-28 NOTE — Telephone Encounter (Signed)
Scheduled appt per 7/5 los - pt to get an updated schedule in the treatment area.

## 2017-10-29 ENCOUNTER — Other Ambulatory Visit: Payer: Self-pay | Admitting: Oncology

## 2017-10-31 ENCOUNTER — Telehealth: Payer: Self-pay | Admitting: Nurse Practitioner

## 2017-10-31 NOTE — Telephone Encounter (Signed)
Copied from [place] [id number]. Topic: Quick Communication - See Telephone Encounter >> Oct 31, 2017  2:31 PM Gardiner Ramus wrote: CRM for notification. See Telephone encounter for: 10/31/17. Angel from [facility] called for verbal orders for disease and medication management teaching. Also would like to assess cardio pulmonary status. Please advise

## 2017-11-01 NOTE — Telephone Encounter (Signed)
Called [facility] to give ok for verbal orders.

## 2017-11-04 ENCOUNTER — Inpatient Hospital Stay: Payer: Medicare Other

## 2017-11-04 ENCOUNTER — Other Ambulatory Visit: Payer: Medicare Other

## 2017-11-04 ENCOUNTER — Inpatient Hospital Stay (HOSPITAL_BASED_OUTPATIENT_CLINIC_OR_DEPARTMENT_OTHER): Payer: Medicare Other | Admitting: Nurse Practitioner

## 2017-11-04 ENCOUNTER — Telehealth: Payer: Self-pay | Admitting: Oncology

## 2017-11-04 ENCOUNTER — Other Ambulatory Visit: Payer: Self-pay | Admitting: *Deleted

## 2017-11-04 ENCOUNTER — Encounter: Payer: Self-pay | Admitting: Nurse Practitioner

## 2017-11-04 VITALS — BP 147/83 | HR 94 | Temp 98.5°F | Resp 18 | Ht 63.0 in | Wt 167.4 lb

## 2017-11-04 DIAGNOSIS — C50811 Malignant neoplasm of overlapping sites of right female breast: Secondary | ICD-10-CM

## 2017-11-04 DIAGNOSIS — Z95828 Presence of other vascular implants and grafts: Secondary | ICD-10-CM

## 2017-11-04 DIAGNOSIS — Z5111 Encounter for antineoplastic chemotherapy: Secondary | ICD-10-CM | POA: Diagnosis not present

## 2017-11-04 DIAGNOSIS — C7951 Secondary malignant neoplasm of bone: Secondary | ICD-10-CM

## 2017-11-04 DIAGNOSIS — I10 Essential (primary) hypertension: Secondary | ICD-10-CM

## 2017-11-04 DIAGNOSIS — Z79899 Other long term (current) drug therapy: Secondary | ICD-10-CM | POA: Diagnosis not present

## 2017-11-04 DIAGNOSIS — Z171 Estrogen receptor negative status [ER-]: Secondary | ICD-10-CM

## 2017-11-04 DIAGNOSIS — L858 Other specified epidermal thickening: Secondary | ICD-10-CM

## 2017-11-04 DIAGNOSIS — D649 Anemia, unspecified: Secondary | ICD-10-CM | POA: Diagnosis not present

## 2017-11-04 DIAGNOSIS — C50911 Malignant neoplasm of unspecified site of right female breast: Secondary | ICD-10-CM

## 2017-11-04 LAB — CMP (CANCER CENTER ONLY)
ALK PHOS: 898 U/L — AB (ref 38–126)
ALT: 13 U/L (ref 0–44)
AST: 19 U/L (ref 15–41)
Albumin: 3.7 g/dL (ref 3.5–5.0)
Anion gap: 8 (ref 5–15)
BILIRUBIN TOTAL: 0.3 mg/dL (ref 0.3–1.2)
BUN: 6 mg/dL (ref 6–20)
CALCIUM: 8.3 mg/dL — AB (ref 8.9–10.3)
CO2: 23 mmol/L (ref 22–32)
CREATININE: 0.88 mg/dL (ref 0.44–1.00)
Chloride: 105 mmol/L (ref 98–111)
Glucose, Bld: 141 mg/dL — ABNORMAL HIGH (ref 70–99)
Potassium: 4.4 mmol/L (ref 3.5–5.1)
Sodium: 136 mmol/L (ref 135–145)
Total Protein: 7.4 g/dL (ref 6.5–8.1)

## 2017-11-04 LAB — CBC WITH DIFFERENTIAL (CANCER CENTER ONLY)
BASOS PCT: 0 %
Basophils Absolute: 0 10*3/uL (ref 0.0–0.1)
EOS ABS: 0.2 10*3/uL (ref 0.0–0.5)
EOS PCT: 4 %
HCT: 23.6 % — ABNORMAL LOW (ref 34.8–46.6)
Hemoglobin: 7.5 g/dL — ABNORMAL LOW (ref 11.6–15.9)
LYMPHS ABS: 1.4 10*3/uL (ref 0.9–3.3)
Lymphocytes Relative: 35 %
MCH: 27.7 pg (ref 25.1–34.0)
MCHC: 31.8 g/dL (ref 31.5–36.0)
MCV: 87.1 fL (ref 79.5–101.0)
Monocytes Absolute: 0.2 10*3/uL (ref 0.1–0.9)
Monocytes Relative: 6 %
NEUTROS PCT: 55 %
NRBC: 1 /100{WBCs} — AB
Neutro Abs: 2.2 10*3/uL (ref 1.5–6.5)
Platelet Count: 263 10*3/uL (ref 145–400)
RBC: 2.71 MIL/uL — AB (ref 3.70–5.45)
RDW: 18.2 % — AB (ref 11.2–14.5)
WBC: 4 10*3/uL (ref 3.9–10.3)

## 2017-11-04 LAB — SAMPLE TO BLOOD BANK

## 2017-11-04 LAB — PREPARE RBC (CROSSMATCH)

## 2017-11-04 MED ORDER — SODIUM CHLORIDE 0.9% FLUSH
10.0000 mL | Freq: Once | INTRAVENOUS | Status: AC
  Administered 2017-11-04: 10 mL
  Filled 2017-11-04: qty 10

## 2017-11-04 MED ORDER — DEXAMETHASONE SODIUM PHOSPHATE 10 MG/ML IJ SOLN
10.0000 mg | Freq: Once | INTRAMUSCULAR | Status: AC
  Administered 2017-11-04: 10 mg via INTRAVENOUS

## 2017-11-04 MED ORDER — PALONOSETRON HCL INJECTION 0.25 MG/5ML
INTRAVENOUS | Status: AC
Start: 2017-11-04 — End: ?
  Filled 2017-11-04: qty 5

## 2017-11-04 MED ORDER — DIPHENHYDRAMINE HCL 50 MG/ML IJ SOLN
25.0000 mg | Freq: Once | INTRAMUSCULAR | Status: AC
  Administered 2017-11-04: 25 mg via INTRAVENOUS

## 2017-11-04 MED ORDER — DIPHENHYDRAMINE HCL 50 MG/ML IJ SOLN
INTRAMUSCULAR | Status: AC
  Filled 2017-11-04: qty 1

## 2017-11-04 MED ORDER — FAMOTIDINE IN NACL 20-0.9 MG/50ML-% IV SOLN
INTRAVENOUS | Status: AC
Start: 2017-11-04 — End: ?
  Filled 2017-11-04: qty 50

## 2017-11-04 MED ORDER — DEXAMETHASONE SODIUM PHOSPHATE 10 MG/ML IJ SOLN
INTRAMUSCULAR | Status: AC
  Filled 2017-11-04: qty 1

## 2017-11-04 MED ORDER — PALONOSETRON HCL INJECTION 0.25 MG/5ML
0.2500 mg | Freq: Once | INTRAVENOUS | Status: AC
  Administered 2017-11-04: 0.25 mg via INTRAVENOUS

## 2017-11-04 MED ORDER — SODIUM CHLORIDE 0.9% FLUSH
10.0000 mL | INTRAVENOUS | Status: DC | PRN
  Filled 2017-11-04: qty 10

## 2017-11-04 MED ORDER — SODIUM CHLORIDE 0.9 % IV SOLN
Freq: Once | INTRAVENOUS | Status: AC
  Administered 2017-11-04: 13:00:00 via INTRAVENOUS

## 2017-11-04 MED ORDER — HEPARIN SOD (PORK) LOCK FLUSH 100 UNIT/ML IV SOLN
500.0000 [IU] | Freq: Once | INTRAVENOUS | Status: DC | PRN
  Filled 2017-11-04: qty 5

## 2017-11-04 MED ORDER — FAMOTIDINE IN NACL 20-0.9 MG/50ML-% IV SOLN
20.0000 mg | Freq: Once | INTRAVENOUS | Status: AC
  Administered 2017-11-04: 20 mg via INTRAVENOUS

## 2017-11-04 MED ORDER — SODIUM CHLORIDE 0.9 % IV SOLN
80.0000 mg/m2 | Freq: Once | INTRAVENOUS | Status: AC
  Administered 2017-11-04: 150 mg via INTRAVENOUS
  Filled 2017-11-04: qty 25

## 2017-11-04 MED ORDER — SODIUM CHLORIDE 0.9 % IV SOLN
228.0000 mg | Freq: Once | INTRAVENOUS | Status: AC
  Administered 2017-11-04: 230 mg via INTRAVENOUS
  Filled 2017-11-04: qty 23

## 2017-11-04 NOTE — Patient Instructions (Signed)
[facility] [facility] Discharge Instructions for Patients Receiving Chemotherapy  Today you received the following chemotherapy agents paclitaxel (Taxol) and carboplatin (Paraplatin)  To help prevent nausea and vomiting after your treatment, we encourage you to take your nausea medication as directed   If you develop nausea and vomiting that is not controlled by your nausea medication, call the clinic.   BELOW ARE SYMPTOMS THAT SHOULD BE REPORTED IMMEDIATELY:  *FEVER GREATER THAN 100.5 F  *CHILLS WITH OR WITHOUT FEVER  NAUSEA AND VOMITING THAT IS NOT CONTROLLED WITH YOUR NAUSEA MEDICATION  *UNUSUAL SHORTNESS OF BREATH  *UNUSUAL BRUISING OR BLEEDING  TENDERNESS IN MOUTH AND THROAT WITH OR WITHOUT PRESENCE OF ULCERS  *URINARY PROBLEMS  *BOWEL PROBLEMS  UNUSUAL RASH Items with * indicate a potential emergency and should be followed up as soon as possible.  Feel free to call the clinic should you have any questions or concerns. The clinic phone number is [phone].  Please show the [place] at check-in to the Emergency Department and triage nurse.

## 2017-11-04 NOTE — Telephone Encounter (Signed)
Appointment added per 7/12 verbal request from [place]

## 2017-11-04 NOTE — Progress Notes (Signed)
  [place] [facility] OFFICE PROGRESS NOTE   Diagnosis:  Breast cancer   INTERVAL HISTORY:   Olivia Patel returns as scheduled.  She completed cycle 2-day 1 Taxol/carboplatin 10/28/2017.  She denies nausea/vomiting.  No mouth sores.  No diarrhea.  No numbness or tingling in the hands or feet.  Appetite varies.  Objective:  Vital signs in last 24 hours:  Blood pressure (!) 147/83, pulse 94, temperature 98.5 F (36.9 C), temperature source Oral, resp. rate 18, height 5' 3" (1.6 m), weight 167 lb 6.4 oz (75.9 kg), last menstrual period 05/29/2015, SpO2 100 %.    HEENT: No thrush or ulcers. Resp: Lungs clear bilaterally. Cardio: Regular rate and rhythm. GI: Abdomen soft and nontender.  No hepatomegaly. Vascular: No leg edema. Breast: Skin thickening/induration right breast mainly over the lower quadrants. Port-A-Cath without erythema.  Lab Results:  Lab Results  Component Value Date   WBC 4.0 11/04/2017   HGB 7.5 (L) 11/04/2017   HCT 23.6 (L) 11/04/2017   MCV 87.1 11/04/2017   PLT 263 11/04/2017   NEUTROABS 2.2 11/04/2017    Imaging:  No results found.  Medications: I have reviewed the patient's current medications.  Assessment/Plan: 1. Metastatic breast cancer  Large right breast mass with diffuse skin thickening  CTs 09/19/2017 with mediastinal/hilar adenopathy, subcarinal adenopathy, widespread bone lesions, right breast mass with skin thickening  MRI of the brain 09/19/2017-negative for brain metastases  Ultrasound-guided biopsy of the right breast mass on 09/21/2017, invasive mammary carcinoma, HER-2 negative, ER negative, PR negative,Ki-67 90%  Weekly Taxol initiated 09/30/2017  Week 2 Taxol 10/07/2017  Week 3 Taxol/carboplatin added 10/14/2017  Cycle 2 weekly Taxol/carboplatin beginning 10/28/2017 2.Hypercalcemia, status post pamidronate 09/17/2017, calcitonin initiated 09/18/2017, Zometa 09/24/2017,Zometa 10/07/2017 3.Dehydration secondary to hypercalcemia,  resolved 4.Lytic bone lesions, rib fractures secondary to #1 5.Hypertension 6. Renal failure-? Secondary to ATN, improved 7. [facility] hospital admission 09/17/2017-cultures negative, no apparent source for infection, tumor fever? 8. Anemia secondary to metastatic breast cancer and phlebotomy- 1 unit of packed red blood cells 09/30/2017;improved 10/07/2017.  Progressive anemia 11/04/2017.  Transfused 1 unit of packed red blood cells.    Disposition: Olivia Patel appears stable.  She seems to be tolerating chemotherapy well.  Plan to proceed with cycle 2-week 2 Taxol/carboplatin today as scheduled.  She has progressive anemia.  We are arranging for a blood transfusion.  The hypercalcemia has improved significantly.   She will return for lab and cycle 2-week 3 Taxol/carboplatin 11/11/2017.  She will return for lab, follow-up and cycle 3 Taxol/carboplatin on 11/25/2017.  She will contact the office in the interim with any problems.  Plan reviewed with Dr. Benay Spice.    Ned Card ANP/GNP-BC   11/04/2017  12:35 PM

## 2017-11-05 ENCOUNTER — Inpatient Hospital Stay: Payer: Medicare Other

## 2017-11-05 DIAGNOSIS — D649 Anemia, unspecified: Secondary | ICD-10-CM | POA: Diagnosis not present

## 2017-11-05 DIAGNOSIS — C50811 Malignant neoplasm of overlapping sites of right female breast: Secondary | ICD-10-CM | POA: Diagnosis not present

## 2017-11-05 DIAGNOSIS — L858 Other specified epidermal thickening: Secondary | ICD-10-CM | POA: Diagnosis not present

## 2017-11-05 DIAGNOSIS — Z79899 Other long term (current) drug therapy: Secondary | ICD-10-CM | POA: Diagnosis not present

## 2017-11-05 DIAGNOSIS — Z5111 Encounter for antineoplastic chemotherapy: Secondary | ICD-10-CM | POA: Diagnosis not present

## 2017-11-05 DIAGNOSIS — I10 Essential (primary) hypertension: Secondary | ICD-10-CM | POA: Diagnosis not present

## 2017-11-05 DIAGNOSIS — C7951 Secondary malignant neoplasm of bone: Secondary | ICD-10-CM | POA: Diagnosis not present

## 2017-11-05 DIAGNOSIS — Z171 Estrogen receptor negative status [ER-]: Secondary | ICD-10-CM

## 2017-11-05 LAB — PREPARE RBC (CROSSMATCH)

## 2017-11-05 MED ORDER — SODIUM CHLORIDE 0.9 % IV SOLN
250.0000 mL | Freq: Once | INTRAVENOUS | Status: AC
  Administered 2017-11-05: 250 mL via INTRAVENOUS

## 2017-11-05 MED ORDER — SODIUM CHLORIDE 0.9% FLUSH
10.0000 mL | INTRAVENOUS | Status: AC | PRN
  Administered 2017-11-05: 10 mL
  Filled 2017-11-05: qty 10

## 2017-11-05 MED ORDER — HEPARIN SOD (PORK) LOCK FLUSH 100 UNIT/ML IV SOLN
500.0000 [IU] | Freq: Every day | INTRAVENOUS | Status: AC | PRN
  Administered 2017-11-05: 500 [IU]
  Filled 2017-11-05: qty 5

## 2017-11-05 NOTE — Patient Instructions (Signed)

## 2017-11-06 ENCOUNTER — Other Ambulatory Visit: Payer: Self-pay | Admitting: Oncology

## 2017-11-07 ENCOUNTER — Telehealth: Payer: Self-pay

## 2017-11-07 LAB — TYPE AND SCREEN
ABO/RH(D): O NEG
Antibody Screen: NEGATIVE
UNIT DIVISION: 0

## 2017-11-07 LAB — BPAM RBC
Blood Product Expiration Date: 201908112359
ISSUE DATE / TIME: 201907130847
UNIT TYPE AND RH: 9500

## 2017-11-07 NOTE — Telephone Encounter (Signed)
spoke with patient concerning upcoming appointments that was scheduled for her per 7/12 los. Mailed a calender and a letter enclosed.

## 2017-11-08 ENCOUNTER — Ambulatory Visit: Payer: Medicare Other | Admitting: Oncology

## 2017-11-11 ENCOUNTER — Inpatient Hospital Stay: Payer: Medicare Other

## 2017-11-11 VITALS — BP 127/84 | HR 95 | Temp 98.5°F | Resp 18 | Wt 165.0 lb

## 2017-11-11 DIAGNOSIS — C50811 Malignant neoplasm of overlapping sites of right female breast: Secondary | ICD-10-CM | POA: Diagnosis not present

## 2017-11-11 DIAGNOSIS — C50911 Malignant neoplasm of unspecified site of right female breast: Secondary | ICD-10-CM

## 2017-11-11 DIAGNOSIS — Z171 Estrogen receptor negative status [ER-]: Secondary | ICD-10-CM | POA: Diagnosis not present

## 2017-11-11 DIAGNOSIS — C7951 Secondary malignant neoplasm of bone: Secondary | ICD-10-CM | POA: Diagnosis not present

## 2017-11-11 DIAGNOSIS — Z95828 Presence of other vascular implants and grafts: Secondary | ICD-10-CM

## 2017-11-11 DIAGNOSIS — I10 Essential (primary) hypertension: Secondary | ICD-10-CM | POA: Diagnosis not present

## 2017-11-11 DIAGNOSIS — D649 Anemia, unspecified: Secondary | ICD-10-CM | POA: Diagnosis not present

## 2017-11-11 DIAGNOSIS — Z79899 Other long term (current) drug therapy: Secondary | ICD-10-CM | POA: Diagnosis not present

## 2017-11-11 DIAGNOSIS — Z5111 Encounter for antineoplastic chemotherapy: Secondary | ICD-10-CM | POA: Diagnosis not present

## 2017-11-11 DIAGNOSIS — L858 Other specified epidermal thickening: Secondary | ICD-10-CM | POA: Diagnosis not present

## 2017-11-11 LAB — CBC WITH DIFFERENTIAL (CANCER CENTER ONLY)
Basophils Absolute: 0 10*3/uL (ref 0.0–0.1)
Basophils Relative: 0 %
Eosinophils Absolute: 0.1 10*3/uL (ref 0.0–0.5)
Eosinophils Relative: 3 %
HEMATOCRIT: 27.7 % — AB (ref 34.8–46.6)
HEMOGLOBIN: 8.9 g/dL — AB (ref 11.6–15.9)
LYMPHS ABS: 1.3 10*3/uL (ref 0.9–3.3)
LYMPHS PCT: 32 %
MCH: 27.4 pg (ref 25.1–34.0)
MCHC: 32.1 g/dL (ref 31.5–36.0)
MCV: 85.2 fL (ref 79.5–101.0)
MONO ABS: 0.3 10*3/uL (ref 0.1–0.9)
MONOS PCT: 7 %
NEUTROS ABS: 2.3 10*3/uL (ref 1.5–6.5)
NEUTROS PCT: 58 %
Platelet Count: 226 10*3/uL (ref 145–400)
RBC: 3.25 MIL/uL — ABNORMAL LOW (ref 3.70–5.45)
RDW: 18.1 % — AB (ref 11.2–14.5)
WBC Count: 3.9 10*3/uL (ref 3.9–10.3)

## 2017-11-11 LAB — CMP (CANCER CENTER ONLY)
ALBUMIN: 3.7 g/dL (ref 3.5–5.0)
ALK PHOS: 982 U/L — AB (ref 38–126)
ALT: 12 U/L (ref 0–44)
ANION GAP: 9 (ref 5–15)
AST: 16 U/L (ref 15–41)
BILIRUBIN TOTAL: 0.4 mg/dL (ref 0.3–1.2)
BUN: 7 mg/dL (ref 6–20)
CALCIUM: 8.4 mg/dL — AB (ref 8.9–10.3)
CO2: 21 mmol/L — ABNORMAL LOW (ref 22–32)
Chloride: 108 mmol/L (ref 98–111)
Creatinine: 0.76 mg/dL (ref 0.44–1.00)
GFR, Est AFR Am: >60 mL/min (ref >60)
GFR, Estimated: >60 mL/min (ref >60)
GLUCOSE: 119 mg/dL — AB (ref 70–99)
POTASSIUM: 4 mmol/L (ref 3.5–5.1)
Sodium: 138 mmol/L (ref 135–145)
Total Protein: 7.4 g/dL (ref 6.5–8.1)

## 2017-11-11 MED ORDER — PALONOSETRON HCL INJECTION 0.25 MG/5ML
0.2500 mg | Freq: Once | INTRAVENOUS | Status: AC
  Administered 2017-11-11: 0.25 mg via INTRAVENOUS

## 2017-11-11 MED ORDER — HEPARIN SOD (PORK) LOCK FLUSH 100 UNIT/ML IV SOLN
500.0000 [IU] | Freq: Once | INTRAVENOUS | Status: AC | PRN
  Administered 2017-11-11: 500 [IU]
  Filled 2017-11-11: qty 5

## 2017-11-11 MED ORDER — DIPHENHYDRAMINE HCL 50 MG/ML IJ SOLN
25.0000 mg | Freq: Once | INTRAMUSCULAR | Status: AC
  Administered 2017-11-11: 25 mg via INTRAVENOUS

## 2017-11-11 MED ORDER — SODIUM CHLORIDE 0.9 % IV SOLN
80.0000 mg/m2 | Freq: Once | INTRAVENOUS | Status: AC
  Administered 2017-11-11: 150 mg via INTRAVENOUS
  Filled 2017-11-11: qty 25

## 2017-11-11 MED ORDER — SODIUM CHLORIDE 0.9 % IV SOLN
245.6000 mg | Freq: Once | INTRAVENOUS | Status: AC
  Administered 2017-11-11: 250 mg via INTRAVENOUS
  Filled 2017-11-11: qty 25

## 2017-11-11 MED ORDER — DEXAMETHASONE SODIUM PHOSPHATE 10 MG/ML IJ SOLN
INTRAMUSCULAR | Status: AC
  Filled 2017-11-11: qty 1

## 2017-11-11 MED ORDER — SODIUM CHLORIDE 0.9% FLUSH
10.0000 mL | INTRAVENOUS | Status: DC | PRN
  Administered 2017-11-11: 10 mL
  Filled 2017-11-11: qty 10

## 2017-11-11 MED ORDER — FAMOTIDINE IN NACL 20-0.9 MG/50ML-% IV SOLN
20.0000 mg | Freq: Once | INTRAVENOUS | Status: AC
  Administered 2017-11-11: 20 mg via INTRAVENOUS

## 2017-11-11 MED ORDER — DEXAMETHASONE SODIUM PHOSPHATE 10 MG/ML IJ SOLN
10.0000 mg | Freq: Once | INTRAMUSCULAR | Status: AC
  Administered 2017-11-11: 10 mg via INTRAVENOUS

## 2017-11-11 MED ORDER — SODIUM CHLORIDE 0.9 % IV SOLN
Freq: Once | INTRAVENOUS | Status: AC
Start: 2017-11-11 — End: 2017-11-11
  Administered 2017-11-11: 14:00:00 via INTRAVENOUS

## 2017-11-11 MED ORDER — FAMOTIDINE IN NACL 20-0.9 MG/50ML-% IV SOLN
INTRAVENOUS | Status: AC
  Filled 2017-11-11: qty 50

## 2017-11-11 MED ORDER — DIPHENHYDRAMINE HCL 50 MG/ML IJ SOLN
INTRAMUSCULAR | Status: AC
  Filled 2017-11-11: qty 1

## 2017-11-11 MED ORDER — SODIUM CHLORIDE 0.9% FLUSH
10.0000 mL | Freq: Once | INTRAVENOUS | Status: AC
  Administered 2017-11-11: 10 mL
  Filled 2017-11-11: qty 10

## 2017-11-11 MED ORDER — PALONOSETRON HCL INJECTION 0.25 MG/5ML
INTRAVENOUS | Status: AC
  Filled 2017-11-11: qty 5

## 2017-11-11 NOTE — Patient Instructions (Signed)
[facility] Discharge Instructions for Patients Receiving Chemotherapy  Today you received the following chemotherapy agents paclitaxel (Taxol) and carboplatin (Paraplatin)  To help prevent nausea and vomiting after your treatment, we encourage you to take your nausea medication as directed   If you develop nausea and vomiting that is not controlled by your nausea medication, call the clinic.   BELOW ARE SYMPTOMS THAT SHOULD BE REPORTED IMMEDIATELY:  *FEVER GREATER THAN 100.5 F  *CHILLS WITH OR WITHOUT FEVER  NAUSEA AND VOMITING THAT IS NOT CONTROLLED WITH YOUR NAUSEA MEDICATION  *UNUSUAL SHORTNESS OF BREATH  *UNUSUAL BRUISING OR BLEEDING  TENDERNESS IN MOUTH AND THROAT WITH OR WITHOUT PRESENCE OF ULCERS  *URINARY PROBLEMS  *BOWEL PROBLEMS  UNUSUAL RASH Items with * indicate a potential emergency and should be followed up as soon as possible.  Feel free to call the clinic should you have any questions or concerns. The clinic phone number is [phone].  Please show the [facility] at check-in to the Emergency Department and triage nurse.

## 2017-11-12 LAB — CANCER ANTIGEN 27.29: CA 27.29: 47.2 U/mL — ABNORMAL HIGH (ref 0.0–38.6)

## 2017-11-16 ENCOUNTER — Ambulatory Visit: Payer: Medicare Other

## 2017-11-18 DIAGNOSIS — C7951 Secondary malignant neoplasm of bone: Secondary | ICD-10-CM | POA: Diagnosis not present

## 2017-11-18 DIAGNOSIS — I10 Essential (primary) hypertension: Secondary | ICD-10-CM | POA: Diagnosis not present

## 2017-11-18 DIAGNOSIS — D630 Anemia in neoplastic disease: Secondary | ICD-10-CM | POA: Diagnosis not present

## 2017-11-18 DIAGNOSIS — C50911 Malignant neoplasm of unspecified site of right female breast: Secondary | ICD-10-CM | POA: Diagnosis not present

## 2017-11-18 DIAGNOSIS — M1990 Unspecified osteoarthritis, unspecified site: Secondary | ICD-10-CM | POA: Diagnosis not present

## 2017-11-18 DIAGNOSIS — R591 Generalized enlarged lymph nodes: Secondary | ICD-10-CM | POA: Diagnosis not present

## 2017-11-18 DIAGNOSIS — G893 Neoplasm related pain (acute) (chronic): Secondary | ICD-10-CM | POA: Diagnosis not present

## 2017-11-18 DIAGNOSIS — N289 Disorder of kidney and ureter, unspecified: Secondary | ICD-10-CM | POA: Diagnosis not present

## 2017-11-18 DIAGNOSIS — M8458XD Pathological fracture in neoplastic disease, other specified site, subsequent encounter for fracture with routine healing: Secondary | ICD-10-CM | POA: Diagnosis not present

## 2017-11-18 DIAGNOSIS — Z7951 Long term (current) use of inhaled steroids: Secondary | ICD-10-CM | POA: Diagnosis not present

## 2017-11-18 DIAGNOSIS — J9811 Atelectasis: Secondary | ICD-10-CM | POA: Diagnosis not present

## 2017-11-20 ENCOUNTER — Other Ambulatory Visit: Payer: Self-pay | Admitting: Oncology

## 2017-11-24 DIAGNOSIS — N289 Disorder of kidney and ureter, unspecified: Secondary | ICD-10-CM | POA: Diagnosis not present

## 2017-11-24 DIAGNOSIS — J9811 Atelectasis: Secondary | ICD-10-CM | POA: Diagnosis not present

## 2017-11-24 DIAGNOSIS — C50911 Malignant neoplasm of unspecified site of right female breast: Secondary | ICD-10-CM | POA: Diagnosis not present

## 2017-11-24 DIAGNOSIS — M8458XD Pathological fracture in neoplastic disease, other specified site, subsequent encounter for fracture with routine healing: Secondary | ICD-10-CM | POA: Diagnosis not present

## 2017-11-24 DIAGNOSIS — D630 Anemia in neoplastic disease: Secondary | ICD-10-CM | POA: Diagnosis not present

## 2017-11-24 DIAGNOSIS — R591 Generalized enlarged lymph nodes: Secondary | ICD-10-CM | POA: Diagnosis not present

## 2017-11-24 DIAGNOSIS — M1990 Unspecified osteoarthritis, unspecified site: Secondary | ICD-10-CM | POA: Diagnosis not present

## 2017-11-24 DIAGNOSIS — Z7951 Long term (current) use of inhaled steroids: Secondary | ICD-10-CM | POA: Diagnosis not present

## 2017-11-24 DIAGNOSIS — C7951 Secondary malignant neoplasm of bone: Secondary | ICD-10-CM | POA: Diagnosis not present

## 2017-11-24 DIAGNOSIS — I10 Essential (primary) hypertension: Secondary | ICD-10-CM | POA: Diagnosis not present

## 2017-11-24 DIAGNOSIS — G893 Neoplasm related pain (acute) (chronic): Secondary | ICD-10-CM | POA: Diagnosis not present

## 2017-11-25 ENCOUNTER — Inpatient Hospital Stay: Payer: Medicare Other | Attending: Oncology

## 2017-11-25 ENCOUNTER — Inpatient Hospital Stay: Payer: Medicare Other

## 2017-11-25 ENCOUNTER — Encounter: Payer: Self-pay | Admitting: Nurse Practitioner

## 2017-11-25 ENCOUNTER — Inpatient Hospital Stay (HOSPITAL_BASED_OUTPATIENT_CLINIC_OR_DEPARTMENT_OTHER): Payer: Medicare Other | Admitting: Nurse Practitioner

## 2017-11-25 VITALS — BP 158/90 | HR 100 | Temp 98.8°F | Resp 17 | Ht 63.0 in | Wt 168.3 lb

## 2017-11-25 DIAGNOSIS — C50811 Malignant neoplasm of overlapping sites of right female breast: Secondary | ICD-10-CM

## 2017-11-25 DIAGNOSIS — C50911 Malignant neoplasm of unspecified site of right female breast: Secondary | ICD-10-CM

## 2017-11-25 DIAGNOSIS — Z5111 Encounter for antineoplastic chemotherapy: Secondary | ICD-10-CM | POA: Insufficient documentation

## 2017-11-25 DIAGNOSIS — C7951 Secondary malignant neoplasm of bone: Secondary | ICD-10-CM | POA: Insufficient documentation

## 2017-11-25 DIAGNOSIS — I10 Essential (primary) hypertension: Secondary | ICD-10-CM | POA: Insufficient documentation

## 2017-11-25 DIAGNOSIS — Z171 Estrogen receptor negative status [ER-]: Secondary | ICD-10-CM | POA: Diagnosis not present

## 2017-11-25 DIAGNOSIS — M1990 Unspecified osteoarthritis, unspecified site: Secondary | ICD-10-CM | POA: Insufficient documentation

## 2017-11-25 DIAGNOSIS — Z95828 Presence of other vascular implants and grafts: Secondary | ICD-10-CM

## 2017-11-25 LAB — CMP (CANCER CENTER ONLY)
ALBUMIN: 3.6 g/dL (ref 3.5–5.0)
ALT: 8 U/L (ref 0–44)
AST: 16 U/L (ref 15–41)
Alkaline Phosphatase: 720 U/L — ABNORMAL HIGH (ref 38–126)
Anion gap: 9 (ref 5–15)
BILIRUBIN TOTAL: 0.4 mg/dL (ref 0.3–1.2)
BUN: 5 mg/dL — ABNORMAL LOW (ref 6–20)
CO2: 21 mmol/L — AB (ref 22–32)
Calcium: 8.3 mg/dL — ABNORMAL LOW (ref 8.9–10.3)
Chloride: 108 mmol/L (ref 98–111)
Creatinine: 0.77 mg/dL (ref 0.44–1.00)
GFR, Est AFR Am: >60 mL/min (ref >60)
GFR, Estimated: >60 mL/min (ref >60)
GLUCOSE: 115 mg/dL — AB (ref 70–99)
POTASSIUM: 3.7 mmol/L (ref 3.5–5.1)
Sodium: 138 mmol/L (ref 135–145)
TOTAL PROTEIN: 7.1 g/dL (ref 6.5–8.1)

## 2017-11-25 LAB — CBC WITH DIFFERENTIAL (CANCER CENTER ONLY)
BASOS ABS: 0 10*3/uL (ref 0.0–0.1)
Basophils Relative: 0 %
Eosinophils Absolute: 0 10*3/uL (ref 0.0–0.5)
Eosinophils Relative: 1 %
HEMATOCRIT: 24.9 % — AB (ref 34.8–46.6)
Hemoglobin: 8.4 g/dL — ABNORMAL LOW (ref 11.6–15.9)
Lymphocytes Relative: 31 %
Lymphs Abs: 1.3 10*3/uL (ref 0.9–3.3)
MCH: 28.9 pg (ref 25.1–34.0)
MCHC: 33.8 g/dL (ref 31.5–36.0)
MCV: 85.3 fL (ref 79.5–101.0)
MONO ABS: 0.4 10*3/uL (ref 0.1–0.9)
Monocytes Relative: 11 %
Neutro Abs: 2.4 10*3/uL (ref 1.5–6.5)
Neutrophils Relative %: 57 %
Platelet Count: 263 10*3/uL (ref 145–400)
RBC: 2.92 MIL/uL — ABNORMAL LOW (ref 3.70–5.45)
RDW: 20.9 % — AB (ref 11.2–14.5)
WBC Count: 4.2 10*3/uL (ref 3.9–10.3)

## 2017-11-25 MED ORDER — SODIUM CHLORIDE 0.9 % IV SOLN
Freq: Once | INTRAVENOUS | Status: AC
  Administered 2017-11-25: 12:00:00 via INTRAVENOUS
  Filled 2017-11-25: qty 250

## 2017-11-25 MED ORDER — DEXAMETHASONE SODIUM PHOSPHATE 10 MG/ML IJ SOLN
10.0000 mg | Freq: Once | INTRAMUSCULAR | Status: AC
  Administered 2017-11-25: 10 mg via INTRAVENOUS

## 2017-11-25 MED ORDER — PALONOSETRON HCL INJECTION 0.25 MG/5ML
0.2500 mg | Freq: Once | INTRAVENOUS | Status: AC
  Administered 2017-11-25: 0.25 mg via INTRAVENOUS

## 2017-11-25 MED ORDER — SODIUM CHLORIDE 0.9% FLUSH
10.0000 mL | Freq: Once | INTRAVENOUS | Status: AC
  Administered 2017-11-25: 10 mL
  Filled 2017-11-25: qty 10

## 2017-11-25 MED ORDER — FAMOTIDINE IN NACL 20-0.9 MG/50ML-% IV SOLN
INTRAVENOUS | Status: AC
  Filled 2017-11-25: qty 50

## 2017-11-25 MED ORDER — PALONOSETRON HCL INJECTION 0.25 MG/5ML
INTRAVENOUS | Status: AC
  Filled 2017-11-25: qty 5

## 2017-11-25 MED ORDER — LABETALOL HCL 300 MG PO TABS: 300.0000 mg | ORAL_TABLET | Freq: Two times a day (BID) | ORAL | 0 refills | Status: DC

## 2017-11-25 MED ORDER — DEXAMETHASONE SODIUM PHOSPHATE 10 MG/ML IJ SOLN
INTRAMUSCULAR | Status: AC
  Filled 2017-11-25: qty 1

## 2017-11-25 MED ORDER — SODIUM CHLORIDE 0.9% FLUSH
10.0000 mL | INTRAVENOUS | Status: DC | PRN
  Administered 2017-11-25: 10 mL
  Filled 2017-11-25: qty 10

## 2017-11-25 MED ORDER — SODIUM CHLORIDE 0.9 % IV SOLN
80.0000 mg/m2 | Freq: Once | INTRAVENOUS | Status: AC
  Administered 2017-11-25: 150 mg via INTRAVENOUS
  Filled 2017-11-25: qty 25

## 2017-11-25 MED ORDER — POTASSIUM CHLORIDE ER 20 MEQ PO TBCR: 20.0000 meq | EXTENDED_RELEASE_TABLET | Freq: Every day | ORAL | 0 refills | Status: DC

## 2017-11-25 MED ORDER — HEPARIN SOD (PORK) LOCK FLUSH 100 UNIT/ML IV SOLN
500.0000 [IU] | Freq: Once | INTRAVENOUS | Status: AC | PRN
  Administered 2017-11-25: 500 [IU]
  Filled 2017-11-25: qty 5

## 2017-11-25 MED ORDER — DIPHENHYDRAMINE HCL 50 MG/ML IJ SOLN
25.0000 mg | Freq: Once | INTRAMUSCULAR | Status: AC
  Administered 2017-11-25: 25 mg via INTRAVENOUS

## 2017-11-25 MED ORDER — CARBOPLATIN CHEMO INJECTION 450 MG/45ML
245.6000 mg | Freq: Once | INTRAVENOUS | Status: AC
  Administered 2017-11-25: 250 mg via INTRAVENOUS
  Filled 2017-11-25: qty 25

## 2017-11-25 MED ORDER — FAMOTIDINE IN NACL 20-0.9 MG/50ML-% IV SOLN
20.0000 mg | Freq: Once | INTRAVENOUS | Status: AC
  Administered 2017-11-25: 20 mg via INTRAVENOUS

## 2017-11-25 MED ORDER — DIPHENHYDRAMINE HCL 50 MG/ML IJ SOLN
INTRAMUSCULAR | Status: AC
  Filled 2017-11-25: qty 1

## 2017-11-25 NOTE — Progress Notes (Addendum)
  [place] OFFICE PROGRESS NOTE   Diagnosis: Breast cancer  INTERVAL HISTORY:   Olivia Patel returns as scheduled.  She completed cycle 2-week 3 Taxol/carboplatin on 11/11/2017.  She denies nausea/vomiting.  No mouth sores.  She had a few loose stools but no frank diarrhea.  She feels the right breast is continuing to improve.  No pain except related to "arthritis".  She has a good appetite.  She is gaining weight.  No numbness or tingling in her hands or feet.  She denies shortness of breath.  She does not feel she needs a blood transfusion.  Objective:  Vital signs in last 24 hours:  Blood pressure (!) 158/90, pulse 100, temperature 98.8 F (37.1 C), temperature source Oral, resp. rate 17, height '5\' 3"'$  (1.6 m), weight 168 lb 4.8 oz (76.3 kg), last menstrual period 05/29/2015, SpO2 100 %.    HEENT: No thrush or ulcers. Resp: Lungs clear bilaterally. Cardio: Regular rate and rhythm. GI: Abdomen soft and nontender.  No hepatomegaly. Vascular: No leg edema. Breast: Skin thickening/induration right breast, appears improved. Port-A-Cath without erythema.  Lab Results:  Lab Results  Component Value Date   WBC 4.2 11/25/2017   HGB 8.4 (L) 11/25/2017   HCT 24.9 (L) 11/25/2017   MCV 85.3 11/25/2017   PLT 263 11/25/2017   NEUTROABS 2.4 11/25/2017    Imaging:  No results found.  Medications: I have reviewed the patient's current medications.  Assessment/Plan: 1. Metastatic breast cancer  Large right breast mass with diffuse skin thickening  CTs 09/19/2017 with mediastinal/hilar adenopathy, subcarinal adenopathy, widespread bone lesions, right breast mass with skin thickening  MRI of the brain 09/19/2017-negative for brain metastases  Ultrasound-guided biopsy of the right breast mass on 09/21/2017, invasive mammary carcinoma, HER-2 negative, ER negative, PR negative,Ki-67 90%  Weekly Taxol initiated 09/30/2017  Week 2 Taxol 10/07/2017  Week 3 Taxol/carboplatin  added 10/14/2017  Cycle 2 weekly Taxol/carboplatin beginning 10/28/2017  Cycle 3 weekly Taxol/carboplatin beginning 11/25/2017 2.Hypercalcemia, status post pamidronate 09/17/2017, calcitonin initiated 09/18/2017, Zometa 09/24/2017,Zometa 10/07/2017; improved. 3.Dehydration secondary to hypercalcemia, resolved 4.Lytic bone lesions, rib fractures secondary to #1 5.Hypertension 6. Renal failure-? Secondary to ATN, improved 7. [facility] hospital admission 09/17/2017-cultures negative, no apparent source for infection, tumor fever? 8. Anemia secondary to metastatic breast cancer and phlebotomy- 1 unit of packed red blood cells 09/30/2017;improved 10/07/2017.  Progressive anemia 11/04/2017.  Transfused 1 unit of packed red blood cells.    Disposition: Olivia Patel appears stable.  She has completed 2 cycles of weekly Taxol/carboplatin, 3 weeks on/1 week off.  She is tolerating treatment well and her performance status is better.  Plan to proceed with cycle 3-week 1 Taxol/carboplatin today as scheduled.  She will return for week 2 on 12/02/2017.  We will see her in follow-up prior to week 3 on 12/09/2017.  She will contact the office in the interim with any problems.    Ned Card ANP/GNP-BC   11/25/2017  11:40 AM

## 2017-11-25 NOTE — Patient Instructions (Signed)
   [facility] Discharge Instructions for Patients Receiving Chemotherapy  Today you received the following chemotherapy agents Taxol and Carboplatin   To help prevent nausea and vomiting after your treatment, we encourage you to take your nausea medication as directed.    If you develop nausea and vomiting that is not controlled by your nausea medication, call the clinic.   BELOW ARE SYMPTOMS THAT SHOULD BE REPORTED IMMEDIATELY:  *FEVER GREATER THAN 100.5 F  *CHILLS WITH OR WITHOUT FEVER  NAUSEA AND VOMITING THAT IS NOT CONTROLLED WITH YOUR NAUSEA MEDICATION  *UNUSUAL SHORTNESS OF BREATH  *UNUSUAL BRUISING OR BLEEDING  TENDERNESS IN MOUTH AND THROAT WITH OR WITHOUT PRESENCE OF ULCERS  *URINARY PROBLEMS  *BOWEL PROBLEMS  UNUSUAL RASH Items with * indicate a potential emergency and should be followed up as soon as possible.  Feel free to call the clinic should you have any questions or concerns. The clinic phone number is [phone].  Please show the CHEMO ALERT CARD at check-in to the Emergency Department and triage nurse.   

## 2017-11-26 ENCOUNTER — Other Ambulatory Visit: Payer: Self-pay | Admitting: Oncology

## 2017-11-28 ENCOUNTER — Telehealth: Payer: Self-pay

## 2017-11-28 NOTE — Telephone Encounter (Signed)
Per 8/2 no los

## 2017-11-28 NOTE — Telephone Encounter (Signed)
error 

## 2017-12-02 ENCOUNTER — Inpatient Hospital Stay: Payer: Medicare Other

## 2017-12-02 ENCOUNTER — Encounter: Payer: Self-pay | Admitting: Nurse Practitioner

## 2017-12-02 ENCOUNTER — Ambulatory Visit (INDEPENDENT_AMBULATORY_CARE_PROVIDER_SITE_OTHER): Payer: Medicare Other | Admitting: Nurse Practitioner

## 2017-12-02 VITALS — BP 154/80 | HR 109 | Temp 98.9°F | Resp 16 | Ht 63.0 in | Wt 167.0 lb

## 2017-12-02 VITALS — BP 155/88 | HR 98 | Temp 99.1°F | Resp 16

## 2017-12-02 DIAGNOSIS — I10 Essential (primary) hypertension: Secondary | ICD-10-CM

## 2017-12-02 DIAGNOSIS — Z171 Estrogen receptor negative status [ER-]: Secondary | ICD-10-CM | POA: Diagnosis not present

## 2017-12-02 DIAGNOSIS — Z5111 Encounter for antineoplastic chemotherapy: Secondary | ICD-10-CM | POA: Diagnosis not present

## 2017-12-02 DIAGNOSIS — C50811 Malignant neoplasm of overlapping sites of right female breast: Secondary | ICD-10-CM

## 2017-12-02 DIAGNOSIS — C50911 Malignant neoplasm of unspecified site of right female breast: Secondary | ICD-10-CM

## 2017-12-02 DIAGNOSIS — Z95828 Presence of other vascular implants and grafts: Secondary | ICD-10-CM

## 2017-12-02 DIAGNOSIS — C7951 Secondary malignant neoplasm of bone: Secondary | ICD-10-CM | POA: Diagnosis not present

## 2017-12-02 DIAGNOSIS — M1990 Unspecified osteoarthritis, unspecified site: Secondary | ICD-10-CM | POA: Diagnosis not present

## 2017-12-02 LAB — CBC WITH DIFFERENTIAL (CANCER CENTER ONLY)
BASOS ABS: 0 10*3/uL (ref 0.0–0.1)
Basophils Relative: 0 %
Eosinophils Absolute: 0 10*3/uL (ref 0.0–0.5)
Eosinophils Relative: 1 %
HEMATOCRIT: 24.6 % — AB (ref 34.8–46.6)
Hemoglobin: 8.2 g/dL — ABNORMAL LOW (ref 11.6–15.9)
LYMPHS PCT: 33 %
Lymphs Abs: 1.5 10*3/uL (ref 0.9–3.3)
MCH: 28.6 pg (ref 25.1–34.0)
MCHC: 33.3 g/dL (ref 31.5–36.0)
MCV: 85.8 fL (ref 79.5–101.0)
MONO ABS: 0.3 10*3/uL (ref 0.1–0.9)
MONOS PCT: 7 %
NEUTROS ABS: 2.7 10*3/uL (ref 1.5–6.5)
Neutrophils Relative %: 59 %
Platelet Count: 230 10*3/uL (ref 145–400)
RBC: 2.86 MIL/uL — ABNORMAL LOW (ref 3.70–5.45)
RDW: 21.1 % — AB (ref 11.2–14.5)
WBC Count: 4.5 10*3/uL (ref 3.9–10.3)

## 2017-12-02 LAB — CMP (CANCER CENTER ONLY)
ALBUMIN: 3.7 g/dL (ref 3.5–5.0)
ALT: 10 U/L (ref 0–44)
ANION GAP: 8 (ref 5–15)
AST: 17 U/L (ref 15–41)
Alkaline Phosphatase: 546 U/L — ABNORMAL HIGH (ref 38–126)
BUN: 6 mg/dL (ref 6–20)
CHLORIDE: 107 mmol/L (ref 98–111)
CO2: 22 mmol/L (ref 22–32)
Calcium: 8.8 mg/dL — ABNORMAL LOW (ref 8.9–10.3)
Creatinine: 0.78 mg/dL (ref 0.44–1.00)
GFR, Est AFR Am: >60 mL/min (ref >60)
GFR, Estimated: >60 mL/min (ref >60)
GLUCOSE: 126 mg/dL — AB (ref 70–99)
POTASSIUM: 4.1 mmol/L (ref 3.5–5.1)
SODIUM: 137 mmol/L (ref 135–145)
Total Bilirubin: 0.4 mg/dL (ref 0.3–1.2)
Total Protein: 7.2 g/dL (ref 6.5–8.1)

## 2017-12-02 MED ORDER — PALONOSETRON HCL INJECTION 0.25 MG/5ML
INTRAVENOUS | Status: AC
  Filled 2017-12-02: qty 5

## 2017-12-02 MED ORDER — SODIUM CHLORIDE 0.9% FLUSH
10.0000 mL | Freq: Once | INTRAVENOUS | Status: AC
  Administered 2017-12-02: 10 mL
  Filled 2017-12-02: qty 10

## 2017-12-02 MED ORDER — SODIUM CHLORIDE 0.9 % IV SOLN
Freq: Once | INTRAVENOUS | Status: AC
  Administered 2017-12-02: 13:00:00 via INTRAVENOUS
  Filled 2017-12-02: qty 250

## 2017-12-02 MED ORDER — PALONOSETRON HCL INJECTION 0.25 MG/5ML
0.2500 mg | Freq: Once | INTRAVENOUS | Status: AC
  Administered 2017-12-02: 0.25 mg via INTRAVENOUS

## 2017-12-02 MED ORDER — FAMOTIDINE IN NACL 20-0.9 MG/50ML-% IV SOLN
INTRAVENOUS | Status: AC
  Filled 2017-12-02: qty 50

## 2017-12-02 MED ORDER — DEXAMETHASONE SODIUM PHOSPHATE 10 MG/ML IJ SOLN
INTRAMUSCULAR | Status: AC
  Filled 2017-12-02: qty 1

## 2017-12-02 MED ORDER — DIPHENHYDRAMINE HCL 50 MG/ML IJ SOLN
INTRAMUSCULAR | Status: AC
  Filled 2017-12-02: qty 1

## 2017-12-02 MED ORDER — DIPHENHYDRAMINE HCL 50 MG/ML IJ SOLN
25.0000 mg | Freq: Once | INTRAMUSCULAR | Status: AC
  Administered 2017-12-02: 25 mg via INTRAVENOUS

## 2017-12-02 MED ORDER — HEPARIN SOD (PORK) LOCK FLUSH 100 UNIT/ML IV SOLN
500.0000 [IU] | Freq: Once | INTRAVENOUS | Status: AC | PRN
  Administered 2017-12-02: 500 [IU]
  Filled 2017-12-02: qty 5

## 2017-12-02 MED ORDER — SODIUM CHLORIDE 0.9% FLUSH
10.0000 mL | INTRAVENOUS | Status: DC | PRN
  Administered 2017-12-02: 10 mL
  Filled 2017-12-02: qty 10

## 2017-12-02 MED ORDER — SODIUM CHLORIDE 0.9 % IV SOLN
245.6000 mg | Freq: Once | INTRAVENOUS | Status: AC
  Administered 2017-12-02: 250 mg via INTRAVENOUS
  Filled 2017-12-02: qty 25

## 2017-12-02 MED ORDER — PACLITAXEL CHEMO INJECTION 300 MG/50ML
80.0000 mg/m2 | Freq: Once | INTRAVENOUS | Status: AC
  Administered 2017-12-02: 150 mg via INTRAVENOUS
  Filled 2017-12-02: qty 25

## 2017-12-02 MED ORDER — FAMOTIDINE IN NACL 20-0.9 MG/50ML-% IV SOLN
20.0000 mg | Freq: Once | INTRAVENOUS | Status: AC
  Administered 2017-12-02: 20 mg via INTRAVENOUS

## 2017-12-02 MED ORDER — DEXAMETHASONE SODIUM PHOSPHATE 10 MG/ML IJ SOLN
10.0000 mg | Freq: Once | INTRAMUSCULAR | Status: AC
  Administered 2017-12-02: 10 mg via INTRAVENOUS

## 2017-12-02 NOTE — Assessment & Plan Note (Signed)
BP slightly elevated today Continue current medications Continue routine follow up with oncology as instructed

## 2017-12-02 NOTE — Patient Instructions (Signed)
Please return in about 3-6 months for follow up visit, after you finish treatments with oncology, or sooner if needed.  It was nice to see you today, thanks for letting me take care of you!

## 2017-12-02 NOTE — Patient Instructions (Signed)
   [facility] Discharge Instructions for Patients Receiving Chemotherapy  Today you received the following chemotherapy agents Taxol and Carboplatin   To help prevent nausea and vomiting after your treatment, we encourage you to take your nausea medication as directed.    If you develop nausea and vomiting that is not controlled by your nausea medication, call the clinic.   BELOW ARE SYMPTOMS THAT SHOULD BE REPORTED IMMEDIATELY:  *FEVER GREATER THAN 100.5 F  *CHILLS WITH OR WITHOUT FEVER  NAUSEA AND VOMITING THAT IS NOT CONTROLLED WITH YOUR NAUSEA MEDICATION  *UNUSUAL SHORTNESS OF BREATH  *UNUSUAL BRUISING OR BLEEDING  TENDERNESS IN MOUTH AND THROAT WITH OR WITHOUT PRESENCE OF ULCERS  *URINARY PROBLEMS  *BOWEL PROBLEMS  UNUSUAL RASH Items with * indicate a potential emergency and should be followed up as soon as possible.  Feel free to call the clinic should you have any questions or concerns. The clinic phone number is [phone].  Please show the CHEMO ALERT CARD at check-in to the Emergency Department and triage nurse.   

## 2017-12-02 NOTE — Assessment & Plan Note (Signed)
Continue follow up with oncology as scheduled

## 2017-12-02 NOTE — Progress Notes (Signed)
Name: Olivia Patel   MRN: [id number]    DOB: Feb 25, 1961   Date:12/02/2017       Progress Note  Subjective  Chief Complaint  Chief Complaint  Patient presents with  . Follow-up    HPI  Olivia Patel presents today for routine follow up and to request handicap placard. Since our last visit in May, she has been diagnosed with malignant neoplasm of right breast and is following with oncology weekly for chemotherapy and routine lab work. She is currently being prescribed all daily medications by oncology. She will see the oncologist again at the end of august to discuss prognosis after chemotherapy. She says she overall feels well, she has lost weight but seems to have improved appetite recently and hopes to have a good report after chemotherapy. Her blood pressure is slightly elevated today, she says she is currently being prescribed labetolol for HTN by oncology providers, taking daily as prescribed.  Patient Active Problem List   Diagnosis Date Noted  . Port-A-Cath in place 10/28/2017  . Breast cancer ([place]) 09/27/2017  . Goals of care, counseling/discussion 09/27/2017  . Anemia 09/17/2017  . Hypercalcemia 09/17/2017  . AKI (acute kidney injury) ([place]) 09/17/2017  . Hypokalemia 09/17/2017  . Rotator cuff tendinitis 08/11/2016  . Essential hypertension 09/17/2015  . Arthritis 10/09/2012    Past Surgical History:  Procedure Laterality Date  . IR IMAGING GUIDED PORT INSERTION  10/25/2017  . TUBAL LIGATION      Family History  Problem Relation Age of Onset  . Hypertension Mother   . Diabetes Mother   . Diabetes Sister   . Hypertension Father   . Kidney failure Father     Social History   Socioeconomic History  . Marital status: Single    Spouse name: Not on file  . Number of children: 6  . Years of education: 37  . Highest education level: Not on file  Occupational History  . Occupation: Disability  Social Needs  . Financial resource strain: Not on file  . Food insecurity:     Worry: Not on file    Inability: Not on file  . Transportation needs:    Medical: Not on file    Non-medical: Not on file  Tobacco Use  . Smoking status: Never Smoker  . Smokeless tobacco: Never Used  Substance and Sexual Activity  . Alcohol use: No    Alcohol/week: 0.0 standard drinks  . Drug use: No  . Sexual activity: Never    Partners: Male  Lifestyle  . Physical activity:    Days per week: Not on file    Minutes per session: Not on file  . Stress: Not on file  Relationships  . Social connections:    Talks on phone: Not on file    Gets together: Not on file    Attends religious service: Not on file    Active member of club or organization: Not on file    Attends meetings of clubs or organizations: Not on file    Relationship status: Not on file  . Intimate partner violence:    Fear of current or ex partner: Not on file    Emotionally abused: Not on file    Physically abused: Not on file    Forced sexual activity: Not on file  Other Topics Concern  . Not on file  Social History Narrative  . Not on file     Current Outpatient Medications:  .  labetalol (NORMODYNE) 300 MG tablet, Take  1 tablet (300 mg total) by mouth 2 (two) times daily., Disp: 60 tablet, Rfl: 0 .  Potassium Chloride ER 20 MEQ TBCR, Take 20 mEq by mouth daily., Disp: 30 tablet, Rfl: 0 .  fluticasone (FLONASE) 50 MCG/ACT nasal spray, Place 2 sprays into both nostrils daily. (Patient not taking: Reported on 11/04/2017), Disp: 16 g, Rfl: 2 .  lidocaine-prilocaine (EMLA) cream, Apply to portacath site 1 hour prior to use (Patient not taking: Reported on 12/02/2017), Disp: 30 g, Rfl: 2 .  Multiple Vitamin (MULTIVITAMIN WITH MINERALS) TABS tablet, Take 1 tablet by mouth daily. (Patient not taking: Reported on 11/25/2017), Disp: 30 tablet, Rfl: 0 .  prochlorperazine (COMPAZINE) 10 MG tablet, Take 1 tablet (10 mg total) by mouth every 6 (six) hours as needed for nausea or vomiting. (Patient not taking: Reported  on 10/07/2017), Disp: 30 tablet, Rfl: 0 .  senna-docusate (SENOKOT-S) 8.6-50 MG tablet, Take 1 tablet by mouth at bedtime as needed for mild constipation. (Patient not taking: Reported on 11/04/2017), Disp: 30 tablet, Rfl: 0  No Known Allergies   ROS See HPI  Objective  Vitals:   12/02/17 0957  BP: (!) 154/80  Pulse: (!) 109  Resp: 16  Temp: 98.9 F (37.2 C)  TempSrc: Oral  SpO2: 98%  Weight: 167 lb (75.8 kg)  Height: 5\' 3"  (1.6 m)   Body mass index is 29.58 kg/m.  Physical Exam Vital signs reviewed. Constitutional: Patient appears well-developed and well-nourished. No distress.  HENT: Head: Normocephalic and atraumatic.  Nose: Nose normal. Mouth/Throat: Oropharynx is clear and moist. No oropharyngeal exudate.  Eyes: Conjunctivae and EOM are normal. Pupils are equal, round, and reactive to light. No scleral icterus.  Neck: Normal range of motion. Neck supple.  Cardiovascular: Normal rate, regular rhythm and normal heart sounds.   Distal pulses intact. Pulmonary/Chest: Effort normal. No respiratory distress. Neurological: She is alert and oriented to person, place, and time. No cranial nerve deficit. Coordination, balance, strength, speech are normal. Ambulatory with walker. Skin: Skin is warm and dry. No rash noted. No erythema.  Psychiatric: Patient has a normal mood and affect. behavior is normal. Judgment and thought content normal.   Assessment & Plan RTC in 3-6 months, after oncology treatments are done Handicap placard renewed today I will see her back in 3-6 months, after she finishes chemo treatments with oncology to catch back up on primary care needs

## 2017-12-04 ENCOUNTER — Other Ambulatory Visit: Payer: Self-pay | Admitting: Oncology

## 2017-12-09 ENCOUNTER — Inpatient Hospital Stay: Payer: Medicare Other

## 2017-12-09 ENCOUNTER — Other Ambulatory Visit: Payer: Medicare Other

## 2017-12-09 ENCOUNTER — Inpatient Hospital Stay (HOSPITAL_BASED_OUTPATIENT_CLINIC_OR_DEPARTMENT_OTHER): Payer: Medicare Other | Admitting: Oncology

## 2017-12-09 VITALS — BP 156/76 | HR 108 | Temp 98.9°F | Resp 18 | Ht 63.0 in | Wt 166.4 lb

## 2017-12-09 VITALS — HR 91

## 2017-12-09 DIAGNOSIS — C50911 Malignant neoplasm of unspecified site of right female breast: Secondary | ICD-10-CM

## 2017-12-09 DIAGNOSIS — C7951 Secondary malignant neoplasm of bone: Secondary | ICD-10-CM

## 2017-12-09 DIAGNOSIS — M1990 Unspecified osteoarthritis, unspecified site: Secondary | ICD-10-CM

## 2017-12-09 DIAGNOSIS — Z171 Estrogen receptor negative status [ER-]: Secondary | ICD-10-CM

## 2017-12-09 DIAGNOSIS — C50811 Malignant neoplasm of overlapping sites of right female breast: Secondary | ICD-10-CM

## 2017-12-09 DIAGNOSIS — Z5111 Encounter for antineoplastic chemotherapy: Secondary | ICD-10-CM | POA: Diagnosis not present

## 2017-12-09 DIAGNOSIS — I10 Essential (primary) hypertension: Secondary | ICD-10-CM | POA: Diagnosis not present

## 2017-12-09 DIAGNOSIS — Z95828 Presence of other vascular implants and grafts: Secondary | ICD-10-CM

## 2017-12-09 LAB — CMP (CANCER CENTER ONLY)
ALBUMIN: 3.7 g/dL (ref 3.5–5.0)
ALK PHOS: 422 U/L — AB (ref 38–126)
ALT: 10 U/L (ref 0–44)
AST: 18 U/L (ref 15–41)
Anion gap: 8 (ref 5–15)
BUN: 7 mg/dL (ref 6–20)
CALCIUM: 8.5 mg/dL — AB (ref 8.9–10.3)
CO2: 23 mmol/L (ref 22–32)
CREATININE: 0.76 mg/dL (ref 0.44–1.00)
Chloride: 107 mmol/L (ref 98–111)
GFR, Est AFR Am: >60 mL/min (ref >60)
GFR, Estimated: >60 mL/min (ref >60)
GLUCOSE: 134 mg/dL — AB (ref 70–99)
Potassium: 3.8 mmol/L (ref 3.5–5.1)
SODIUM: 138 mmol/L (ref 135–145)
Total Bilirubin: 0.4 mg/dL (ref 0.3–1.2)
Total Protein: 7 g/dL (ref 6.5–8.1)

## 2017-12-09 LAB — CBC WITH DIFFERENTIAL (CANCER CENTER ONLY)
BASOS PCT: 0 %
Basophils Absolute: 0 10*3/uL (ref 0.0–0.1)
EOS ABS: 0 10*3/uL (ref 0.0–0.5)
Eosinophils Relative: 1 %
HCT: 23.4 % — ABNORMAL LOW (ref 34.8–46.6)
HEMOGLOBIN: 7.6 g/dL — AB (ref 11.6–15.9)
Lymphocytes Relative: 42 %
Lymphs Abs: 1.4 10*3/uL (ref 0.9–3.3)
MCH: 28.8 pg (ref 25.1–34.0)
MCHC: 32.5 g/dL (ref 31.5–36.0)
MCV: 88.6 fL (ref 79.5–101.0)
MONO ABS: 0.2 10*3/uL (ref 0.1–0.9)
MONOS PCT: 7 %
NEUTROS ABS: 1.7 10*3/uL (ref 1.5–6.5)
Neutrophils Relative %: 50 %
Platelet Count: 211 10*3/uL (ref 145–400)
RBC: 2.64 MIL/uL — ABNORMAL LOW (ref 3.70–5.45)
RDW: 20.3 % — ABNORMAL HIGH (ref 11.2–14.5)
WBC Count: 3.4 10*3/uL — ABNORMAL LOW (ref 3.9–10.3)
nRBC: 1 /100 WBC — ABNORMAL HIGH

## 2017-12-09 MED ORDER — DIPHENHYDRAMINE HCL 50 MG/ML IJ SOLN
INTRAMUSCULAR | Status: AC
  Filled 2017-12-09: qty 1

## 2017-12-09 MED ORDER — SODIUM CHLORIDE 0.9 % IV SOLN
245.6000 mg | Freq: Once | INTRAVENOUS | Status: AC
  Administered 2017-12-09: 250 mg via INTRAVENOUS
  Filled 2017-12-09: qty 25

## 2017-12-09 MED ORDER — FAMOTIDINE IN NACL 20-0.9 MG/50ML-% IV SOLN
INTRAVENOUS | Status: AC
  Filled 2017-12-09: qty 50

## 2017-12-09 MED ORDER — SODIUM CHLORIDE 0.9 % IV SOLN
Freq: Once | INTRAVENOUS | Status: AC
  Administered 2017-12-09: 11:00:00 via INTRAVENOUS
  Filled 2017-12-09: qty 250

## 2017-12-09 MED ORDER — HEPARIN SOD (PORK) LOCK FLUSH 100 UNIT/ML IV SOLN
500.0000 [IU] | Freq: Once | INTRAVENOUS | Status: AC | PRN
  Administered 2017-12-09: 500 [IU]
  Filled 2017-12-09: qty 5

## 2017-12-09 MED ORDER — SODIUM CHLORIDE 0.9 % IV SOLN
80.0000 mg/m2 | Freq: Once | INTRAVENOUS | Status: AC
  Administered 2017-12-09: 150 mg via INTRAVENOUS
  Filled 2017-12-09: qty 25

## 2017-12-09 MED ORDER — DIPHENHYDRAMINE HCL 50 MG/ML IJ SOLN
25.0000 mg | Freq: Once | INTRAMUSCULAR | Status: AC
  Administered 2017-12-09: 25 mg via INTRAVENOUS

## 2017-12-09 MED ORDER — DEXAMETHASONE SODIUM PHOSPHATE 10 MG/ML IJ SOLN
INTRAMUSCULAR | Status: AC
  Filled 2017-12-09: qty 1

## 2017-12-09 MED ORDER — PALONOSETRON HCL INJECTION 0.25 MG/5ML
INTRAVENOUS | Status: AC
  Filled 2017-12-09: qty 5

## 2017-12-09 MED ORDER — SODIUM CHLORIDE 0.9% FLUSH
10.0000 mL | Freq: Once | INTRAVENOUS | Status: AC
  Administered 2017-12-09: 10 mL
  Filled 2017-12-09: qty 10

## 2017-12-09 MED ORDER — FAMOTIDINE IN NACL 20-0.9 MG/50ML-% IV SOLN
20.0000 mg | Freq: Once | INTRAVENOUS | Status: AC
  Administered 2017-12-09: 20 mg via INTRAVENOUS

## 2017-12-09 MED ORDER — DEXAMETHASONE SODIUM PHOSPHATE 10 MG/ML IJ SOLN
10.0000 mg | Freq: Once | INTRAMUSCULAR | Status: AC
  Administered 2017-12-09: 10 mg via INTRAVENOUS

## 2017-12-09 MED ORDER — SODIUM CHLORIDE 0.9% FLUSH
10.0000 mL | INTRAVENOUS | Status: DC | PRN
  Administered 2017-12-09: 10 mL
  Filled 2017-12-09: qty 10

## 2017-12-09 MED ORDER — PALONOSETRON HCL INJECTION 0.25 MG/5ML
0.2500 mg | Freq: Once | INTRAVENOUS | Status: AC
  Administered 2017-12-09: 0.25 mg via INTRAVENOUS

## 2017-12-09 NOTE — Progress Notes (Signed)
  [place] OFFICE PROGRESS NOTE   Diagnosis: Breast cancer  INTERVAL HISTORY:   Ms. Bascom returns as scheduled.  She continues weekly Taxol.  No neuropathy symptoms.  She feels well.  No pain other than chronic "arthritis "pain.  The right breast mass is smaller.  Objective:  Vital signs in last 24 hours:  Blood pressure (!) 156/76, pulse (!) 108, temperature 98.9 F (37.2 C), temperature source Oral, resp. rate 18, height _0  (1.6 m), weight 166 lb 6.4 oz (75.5 kg), last menstrual period 05/29/2015, SpO2 100 %.    HEENT: No thrush or ulcers Lymphatics: No cervical or supraclavicular nodes.  1 cm?  Right axillary node Resp: Lungs clear bilaterally Cardio: Regular rate and rhythm GI: Hepatosplenomegaly Vascular: No leg edema  Breast: Firm masslike fullness at the lower inner and lower outer right breast.  Mild edema of the right breast  Portacath/PICC-without erythema  Lab Results:  Lab Results  Component Value Date   WBC 3.4 (L) 12/09/2017   HGB 7.6 (L) 12/09/2017   HCT 23.4 (L) 12/09/2017   MCV 88.6 12/09/2017   PLT 211 12/09/2017   NEUTROABS 1.7 12/09/2017    CMP  Lab Results  Component Value Date   NA 138 12/09/2017   K 3.8 12/09/2017   CL 107 12/09/2017   CO2 23 12/09/2017   GLUCOSE 134 (H) 12/09/2017   BUN 7 12/09/2017   CREATININE 0.76 12/09/2017   CALCIUM 8.5 (L) 12/09/2017   PROT 7.0 12/09/2017   ALBUMIN 3.7 12/09/2017   AST 18 12/09/2017   ALT 10 12/09/2017   ALKPHOS 422 (H) 12/09/2017   BILITOT 0.4 12/09/2017   GFRNONAA >60 12/09/2017   GFRAA >60 12/09/2017     Medications: I have reviewed the patient's current medications.   Assessment/Plan: 1. Metastatic breast cancer  Large right breast mass with diffuse skin thickening  CTs 09/19/2017 with mediastinal/hilar adenopathy, subcarinal adenopathy, widespread bone lesions, right breast mass with skin thickening  MRI of the brain 09/19/2017-negative for brain  metastases  Ultrasound-guided biopsy of the right breast mass on 09/21/2017, invasive mammary carcinoma, HER-2 negative, ER negative, PR negative,Ki-67 90%  Weekly Taxol initiated 09/30/2017  Week 2 Taxol 10/07/2017  Week 3 Taxol/carboplatin added 10/14/2017  Cycle 2 weekly Taxol/carboplatin beginning 10/28/2017  Cycle 3 weekly Taxol/carboplatin beginning 11/25/2017 2.Hypercalcemia, status post pamidronate 09/17/2017, calcitonin initiated 09/18/2017, Zometa 09/24/2017,Zometa 10/07/2017; resolved 3.Dehydration secondary to hypercalcemia, resolved 4.Lytic bone lesions, rib fractures secondary to #1 5.Hypertension 6. Renal failure-? Secondary to ATN, resolved 7. [facility] hospital admission 09/17/2017-cultures negative, no apparent source for infection, tumor fever? 8. Anemia secondary to metastatic breast cancer and phlebotomy- 1 unit of packed red blood cells 09/30/2017;improved 10/07/2017.Progressive anemia 11/04/2017. Transfused 1 unit of packed red blood cells.    Disposition: Ms. Vora appears well.  She will complete the third cycle of weekly Taxol today.  A restaging CT prior to an office visit on 12/23/2017.  She has persistent anemia secondary to chemotherapy and metastatic breast cancer involving the bone marrow.  She will be transfused with packed red blood cells if she develops symptoms related to anemia.  15 minutes were spent with the patient today.  The majority of the time was used for counseling and coordination of care.  Betsy Coder, MD  12/09/2017  11:23 AM

## 2017-12-09 NOTE — Patient Instructions (Signed)
    [facility] Discharge Instructions for Patients Receiving Chemotherapy  Today you received the following chemotherapy agents Taxol and Carboplatin   To help prevent nausea and vomiting after your treatment, we encourage you to take your nausea medication as directed.    If you develop nausea and vomiting that is not controlled by your nausea medication, call the clinic.   BELOW ARE SYMPTOMS THAT SHOULD BE REPORTED IMMEDIATELY:  *FEVER GREATER THAN 100.5 F  *CHILLS WITH OR WITHOUT FEVER  NAUSEA AND VOMITING THAT IS NOT CONTROLLED WITH YOUR NAUSEA MEDICATION  *UNUSUAL SHORTNESS OF BREATH  *UNUSUAL BRUISING OR BLEEDING  TENDERNESS IN MOUTH AND THROAT WITH OR WITHOUT PRESENCE OF ULCERS  *URINARY PROBLEMS  *BOWEL PROBLEMS  UNUSUAL RASH Items with * indicate a potential emergency and should be followed up as soon as possible.  Feel free to call the clinic should you have any questions or concerns. The clinic phone number is [phone].  Please show the CHEMO ALERT CARD at check-in to the Emergency Department and triage nurse.   

## 2017-12-10 LAB — CANCER ANTIGEN 27.29: CA 27.29: 34.2 U/mL (ref 0.0–38.6)

## 2017-12-12 ENCOUNTER — Telehealth: Payer: Self-pay | Admitting: Oncology

## 2017-12-12 NOTE — Telephone Encounter (Signed)
Spoke with patient regarding adding appointments to her schedule prior to CT per 8/16 los

## 2017-12-18 ENCOUNTER — Other Ambulatory Visit: Payer: Self-pay | Admitting: Oncology

## 2017-12-22 ENCOUNTER — Ambulatory Visit (HOSPITAL_COMMUNITY)
Admission: RE | Admit: 2017-12-22 | Discharge: 2017-12-22 | Disposition: A | Discharge status: Home / Self Care | Payer: Medicare Other | Source: Ambulatory Visit | Attending: Oncology | Admitting: Oncology

## 2017-12-22 ENCOUNTER — Telehealth: Payer: Self-pay | Admitting: *Deleted

## 2017-12-22 ENCOUNTER — Inpatient Hospital Stay: Payer: Medicare Other

## 2017-12-22 DIAGNOSIS — K769 Liver disease, unspecified: Secondary | ICD-10-CM | POA: Insufficient documentation

## 2017-12-22 DIAGNOSIS — Z171 Estrogen receptor negative status [ER-]: Secondary | ICD-10-CM | POA: Diagnosis not present

## 2017-12-22 DIAGNOSIS — C50811 Malignant neoplasm of overlapping sites of right female breast: Secondary | ICD-10-CM | POA: Diagnosis not present

## 2017-12-22 DIAGNOSIS — Z5111 Encounter for antineoplastic chemotherapy: Secondary | ICD-10-CM | POA: Diagnosis not present

## 2017-12-22 DIAGNOSIS — I10 Essential (primary) hypertension: Secondary | ICD-10-CM | POA: Diagnosis not present

## 2017-12-22 DIAGNOSIS — M8448XA Pathological fracture, other site, initial encounter for fracture: Secondary | ICD-10-CM | POA: Diagnosis not present

## 2017-12-22 DIAGNOSIS — Z95828 Presence of other vascular implants and grafts: Secondary | ICD-10-CM

## 2017-12-22 DIAGNOSIS — C3491 Malignant neoplasm of unspecified part of right bronchus or lung: Secondary | ICD-10-CM | POA: Diagnosis not present

## 2017-12-22 DIAGNOSIS — C7951 Secondary malignant neoplasm of bone: Secondary | ICD-10-CM | POA: Diagnosis not present

## 2017-12-22 DIAGNOSIS — R590 Localized enlarged lymph nodes: Secondary | ICD-10-CM | POA: Diagnosis not present

## 2017-12-22 DIAGNOSIS — C50911 Malignant neoplasm of unspecified site of right female breast: Secondary | ICD-10-CM

## 2017-12-22 DIAGNOSIS — R911 Solitary pulmonary nodule: Secondary | ICD-10-CM | POA: Diagnosis not present

## 2017-12-22 DIAGNOSIS — M1990 Unspecified osteoarthritis, unspecified site: Secondary | ICD-10-CM | POA: Diagnosis not present

## 2017-12-22 LAB — CBC WITH DIFFERENTIAL (CANCER CENTER ONLY)
Basophils Absolute: 0 10*3/uL (ref 0.0–0.1)
Basophils Relative: 0 %
EOS ABS: 0 10*3/uL (ref 0.0–0.5)
EOS PCT: 0 %
HCT: 24.9 % — ABNORMAL LOW (ref 34.8–46.6)
HEMOGLOBIN: 8 g/dL — AB (ref 11.6–15.9)
LYMPHS ABS: 1.5 10*3/uL (ref 0.9–3.3)
Lymphocytes Relative: 38 %
MCH: 29.4 pg (ref 25.1–34.0)
MCHC: 32.1 g/dL (ref 31.5–36.0)
MCV: 91.5 fL (ref 79.5–101.0)
MONOS PCT: 9 %
Monocytes Absolute: 0.4 10*3/uL (ref 0.1–0.9)
NEUTROS PCT: 53 %
Neutro Abs: 2 10*3/uL (ref 1.5–6.5)
Platelet Count: 221 10*3/uL (ref 145–400)
RBC: 2.72 MIL/uL — AB (ref 3.70–5.45)
RDW: 22 % — ABNORMAL HIGH (ref 11.2–14.5)
WBC Count: 3.8 10*3/uL — ABNORMAL LOW (ref 3.9–10.3)

## 2017-12-22 LAB — CMP (CANCER CENTER ONLY)
ALT: 8 U/L (ref 0–44)
AST: 17 U/L (ref 15–41)
Albumin: 3.8 g/dL (ref 3.5–5.0)
Alkaline Phosphatase: 321 U/L — ABNORMAL HIGH (ref 38–126)
Anion gap: 10 (ref 5–15)
BUN: 8 mg/dL (ref 6–20)
CO2: 21 mmol/L — AB (ref 22–32)
CREATININE: 0.77 mg/dL (ref 0.44–1.00)
Calcium: 9.1 mg/dL (ref 8.9–10.3)
Chloride: 110 mmol/L (ref 98–111)
GFR, Est AFR Am: >60 mL/min (ref >60)
GFR, Estimated: >60 mL/min (ref >60)
GLUCOSE: 95 mg/dL (ref 70–99)
Potassium: 3.9 mmol/L (ref 3.5–5.1)
SODIUM: 141 mmol/L (ref 135–145)
Total Bilirubin: 0.4 mg/dL (ref 0.3–1.2)
Total Protein: 7.3 g/dL (ref 6.5–8.1)

## 2017-12-22 LAB — SAMPLE TO BLOOD BANK

## 2017-12-22 MED ORDER — SODIUM CHLORIDE 0.9% FLUSH
10.0000 mL | Freq: Once | INTRAVENOUS | Status: AC
  Administered 2017-12-22: 10 mL
  Filled 2017-12-22: qty 10

## 2017-12-22 MED ORDER — IOHEXOL 300 MG/ML  SOLN
75.0000 mL | Freq: Once | INTRAMUSCULAR | Status: AC | PRN
  Administered 2017-12-22: 75 mL via INTRAVENOUS

## 2017-12-22 MED ORDER — HEPARIN SOD (PORK) LOCK FLUSH 100 UNIT/ML IV SOLN: 500.0000 [IU] | Freq: Once | INTRAVENOUS | Status: DC

## 2017-12-22 MED ORDER — HEPARIN SOD (PORK) LOCK FLUSH 100 UNIT/ML IV SOLN
500.0000 [IU] | Freq: Once | INTRAVENOUS | Status: AC
  Administered 2017-12-22: 500 [IU]
  Filled 2017-12-22: qty 5

## 2017-12-22 MED ORDER — HEPARIN SOD (PORK) LOCK FLUSH 100 UNIT/ML IV SOLN
INTRAVENOUS | Status: AC
  Filled 2017-12-22: qty 5

## 2017-12-22 NOTE — Telephone Encounter (Signed)
Labs reviewed by Dr. Benay Spice no blood transfusion today. Patient advises she is asymptomatic. Escorted patient to have port de accessed.

## 2017-12-23 ENCOUNTER — Other Ambulatory Visit: Payer: Medicare Other

## 2017-12-23 ENCOUNTER — Telehealth: Payer: Self-pay | Admitting: Oncology

## 2017-12-23 ENCOUNTER — Inpatient Hospital Stay (HOSPITAL_BASED_OUTPATIENT_CLINIC_OR_DEPARTMENT_OTHER): Payer: Medicare Other | Admitting: Oncology

## 2017-12-23 ENCOUNTER — Inpatient Hospital Stay: Payer: Medicare Other

## 2017-12-23 VITALS — BP 154/93 | HR 101 | Temp 98.3°F | Resp 19 | Ht 63.0 in | Wt 167.5 lb

## 2017-12-23 VITALS — HR 91

## 2017-12-23 DIAGNOSIS — M1990 Unspecified osteoarthritis, unspecified site: Secondary | ICD-10-CM

## 2017-12-23 DIAGNOSIS — I10 Essential (primary) hypertension: Secondary | ICD-10-CM

## 2017-12-23 DIAGNOSIS — C7951 Secondary malignant neoplasm of bone: Secondary | ICD-10-CM | POA: Diagnosis not present

## 2017-12-23 DIAGNOSIS — Z171 Estrogen receptor negative status [ER-]: Secondary | ICD-10-CM

## 2017-12-23 DIAGNOSIS — C50811 Malignant neoplasm of overlapping sites of right female breast: Secondary | ICD-10-CM

## 2017-12-23 DIAGNOSIS — C50911 Malignant neoplasm of unspecified site of right female breast: Secondary | ICD-10-CM

## 2017-12-23 DIAGNOSIS — Z5111 Encounter for antineoplastic chemotherapy: Secondary | ICD-10-CM | POA: Diagnosis not present

## 2017-12-23 LAB — CANCER ANTIGEN 27.29: CA 27.29: 31.5 U/mL (ref 0.0–38.6)

## 2017-12-23 MED ORDER — DEXAMETHASONE SODIUM PHOSPHATE 10 MG/ML IJ SOLN
INTRAMUSCULAR | Status: AC
  Filled 2017-12-23: qty 1

## 2017-12-23 MED ORDER — DEXAMETHASONE SODIUM PHOSPHATE 10 MG/ML IJ SOLN
10.0000 mg | Freq: Once | INTRAMUSCULAR | Status: AC
  Administered 2017-12-23: 10 mg via INTRAVENOUS

## 2017-12-23 MED ORDER — SODIUM CHLORIDE 0.9 % IV SOLN
Freq: Once | INTRAVENOUS | Status: AC
  Administered 2017-12-23: 13:00:00 via INTRAVENOUS
  Filled 2017-12-23: qty 250

## 2017-12-23 MED ORDER — DIPHENHYDRAMINE HCL 50 MG/ML IJ SOLN
25.0000 mg | Freq: Once | INTRAMUSCULAR | Status: AC
  Administered 2017-12-23: 25 mg via INTRAVENOUS

## 2017-12-23 MED ORDER — SODIUM CHLORIDE 0.9 % IV SOLN
80.0000 mg/m2 | Freq: Once | INTRAVENOUS | Status: AC
  Administered 2017-12-23: 150 mg via INTRAVENOUS
  Filled 2017-12-23: qty 25

## 2017-12-23 MED ORDER — DIPHENHYDRAMINE HCL 50 MG/ML IJ SOLN
INTRAMUSCULAR | Status: AC
  Filled 2017-12-23: qty 1

## 2017-12-23 MED ORDER — SODIUM CHLORIDE 0.9 % IV SOLN
250.0000 mg | Freq: Once | INTRAVENOUS | Status: AC
  Administered 2017-12-23: 250 mg via INTRAVENOUS
  Filled 2017-12-23: qty 25

## 2017-12-23 MED ORDER — SODIUM CHLORIDE 0.9% FLUSH
10.0000 mL | INTRAVENOUS | Status: DC | PRN
  Administered 2017-12-23: 10 mL
  Filled 2017-12-23: qty 10

## 2017-12-23 MED ORDER — HEPARIN SOD (PORK) LOCK FLUSH 100 UNIT/ML IV SOLN
500.0000 [IU] | Freq: Once | INTRAVENOUS | Status: AC | PRN
  Administered 2017-12-23: 500 [IU]
  Filled 2017-12-23: qty 5

## 2017-12-23 MED ORDER — PALONOSETRON HCL INJECTION 0.25 MG/5ML
INTRAVENOUS | Status: AC
  Filled 2017-12-23: qty 5

## 2017-12-23 MED ORDER — FAMOTIDINE IN NACL 20-0.9 MG/50ML-% IV SOLN
20.0000 mg | Freq: Once | INTRAVENOUS | Status: AC
  Administered 2017-12-23: 20 mg via INTRAVENOUS

## 2017-12-23 MED ORDER — PALONOSETRON HCL INJECTION 0.25 MG/5ML
0.2500 mg | Freq: Once | INTRAVENOUS | Status: AC
  Administered 2017-12-23: 0.25 mg via INTRAVENOUS

## 2017-12-23 MED ORDER — FAMOTIDINE IN NACL 20-0.9 MG/50ML-% IV SOLN
INTRAVENOUS | Status: AC
  Filled 2017-12-23: qty 50

## 2017-12-23 NOTE — Progress Notes (Signed)
[place] OFFICE PROGRESS NOTE   Diagnosis: Breast cancer  INTERVAL HISTORY:   Olivia Patel returns as scheduled.  She was last treated with Taxol/carboplatin on 12/09/2017.  She reports tolerating the chemotherapy well.  No neuropathy symptoms.  She has noted further decrease in the right breast mass.  No new complaint.  Objective:  Vital signs in last 24 hours:  Last menstrual period 05/29/2015.    HEENT: No thrush or ulcers Lymphatics: No cervical or supraclavicular nodes Resp: Lungs clear bilaterally Cardio: Regular rate and rhythm GI: No hepatomegaly, nontender Vascular: No leg edema Breast: Decrease in the right lower quadrant right breast mass.  Persistent skin thickening throughout the right breast  Portacath/PICC-without erythema  Lab Results:  Lab Results  Component Value Date   WBC 3.8 (L) 12/22/2017   HGB 8.0 (L) 12/22/2017   HCT 24.9 (L) 12/22/2017   MCV 91.5 12/22/2017   PLT 221 12/22/2017   NEUTROABS 2.0 12/22/2017    CMP  Lab Results  Component Value Date   NA 141 12/22/2017   K 3.9 12/22/2017   CL 110 12/22/2017   CO2 21 (L) 12/22/2017   GLUCOSE 95 12/22/2017   BUN 8 12/22/2017   CREATININE 0.77 12/22/2017   CALCIUM 9.1 12/22/2017   PROT 7.3 12/22/2017   ALBUMIN 3.8 12/22/2017   AST 17 12/22/2017   ALT 8 12/22/2017   ALKPHOS 321 (H) 12/22/2017   BILITOT 0.4 12/22/2017   GFRNONAA >60 12/22/2017   GFRAA >60 12/22/2017     Imaging:  Ct Chest W Contrast  Result Date: 12/23/2017 CLINICAL DATA:  Metastatic right breast cancer. Restaging status post chemotherapy. EXAM: CT CHEST WITH CONTRAST TECHNIQUE: Multidetector CT imaging of the chest was performed during intravenous contrast administration. CONTRAST:  32m OMNIPAQUE IOHEXOL 300 MG/ML  SOLN COMPARISON:  09/19/2017 chest CT.  09/24/2017 chest radiograph. FINDINGS: Cardiovascular: Normal heart size. No significant pericardial effusion/thickening. Left internal jugular  MediPort terminates at the cavoatrial junction. Great vessels are normal in course and caliber. No central pulmonary emboli. Mediastinum/Nodes: Stable hypodense 1.5 cm inferior left thyroid lobe nodule. Normal esophagus. Mild right axillary adenopathy, decreased. Representative 1.1 cm right axillary node (series 2/image 40), decreased from 1.9 cm. No residual pathologically enlarged right retropectoral nodes. No left axillary adenopathy. No pathologically enlarged mediastinal or hilar nodes. Lungs/Pleura: No pneumothorax. No significant residual pleural effusions. No acute consolidative airspace disease or lung masses. Left upper lobe 5 mm solid pulmonary nodule (series 7/image 55), stable. No new significant pulmonary nodules. Upper abdomen: Small hiatal hernia. Colonic diverticulosis. Two scattered subcentimeter hypodense liver lesions (series 2/image 99 and image 120), for which comparison is limited to the prior CT due to absence of contrast on the prior scan and due to presence of significant streak artifact on the prior scan. Musculoskeletal: Widespread patchy predominantly lytic lesions throughout thoracic skeleton including the thoracic spine, sternum, bilateral ribs and bilateral shoulder girdles, not appreciably changed in size or distribution, with numerous associated pathologic fractures in various stages of healing involving the sternum and bilateral ribs, for example in the lower sternum (series 6/image 72). Asymmetric diffuse right breast skin thickening is not appreciably changed. Previously described 4.2 x 4.1 cm right breast mass is decreased to 2.6 x 2.0 cm (series 2/image 65). Moderate thoracic spondylosis. IMPRESSION: 1. No evidence of new or progressive metastatic disease in the chest. 2. Mild right axillary adenopathy, decreased. 3. Right breast mass is decreased. Stable asymmetric diffuse right breast skin thickening. 4. Stable solitary  subcentimeter left upper lobe pulmonary nodule. 5.  Widespread patchy predominantly lytic osseous metastases throughout the thoracic skeleton, unchanged in size and distribution. Numerous associated pathologic fractures in various stages of healing involving the sternum and bilateral ribs. 6. Two subcentimeter low-attenuation liver lesions, for which comparison to the prior noncontrast CT studies limited. Suggest continued attention on follow-up CT abdomen with IV contrast in 3-6 months. Electronically Signed   By: Ilona Sorrel M.D.   On: 12/23/2017 11:00    Medications: I have reviewed the patient's current medications.   Assessment/Plan: 1. Metastatic breast cancer  Large right breast mass with diffuse skin thickening  CTs 09/19/2017 with mediastinal/hilar adenopathy, subcarinal adenopathy, widespread bone lesions, right breast mass with skin thickening  MRI of the brain 09/19/2017-negative for brain metastases  Ultrasound-guided biopsy of the right breast mass on 09/21/2017, invasive mammary carcinoma, HER-2 negative, ER negative, PR negative,Ki-67 90%  Weekly Taxol initiated 09/30/2017  Week 2 Taxol 10/07/2017  Week 3 Taxol/carboplatin added 10/14/2017  Cycle 2 weekly Taxol/carboplatin beginning 10/28/2017  Cycle 3 weekly Taxol/carboplatin beginning 11/25/2017  CT chest 12/22/2017-decrease in right breast mass and right axillary lymphadenopathy, unchanged bone metastases, 2 subcentimeter low-attenuation liver lesions- no direct IV contrast CT available for comparison  Cycle 4 weekly Taxol/carboplatin beginning 12/23/2017 2.Hypercalcemia, status post pamidronate 09/17/2017, calcitonin initiated 09/18/2017, Zometa 09/24/2017,Zometa 10/07/2017; resolved 3.Dehydration secondary to hypercalcemia, resolved 4.Lytic bone lesions, rib fractures secondary to #1 5.Hypertension 6. Renal failure-? Secondary to ATN, resolved 7. [place] hospital admission 09/17/2017-cultures negative, no apparent source for infection, tumor fever? 8. Anemia  secondary to metastatic breast cancer and phlebotomy- 1 unit of packed red blood cells 09/30/2017;improved 10/07/2017.Progressive anemia 11/04/2017. Transfused 1 unit of packed red blood cells.      Disposition: [name] appears stable.  Her overall clinical status has improved since beginning Taxol/carboplatin.  There is radiologic evidence of a response to therapy.  The plan is to begin another cycle of Taxol/carboplatin today.  She will return for an office visit in 4 weeks.  I will refer her to the genetics counselor.  We will request PDL 1 testing on the right breast biopsy.  25 minutes were spent with the patient today.  The majority of the time was used for counseling and coordination of care.  Betsy Coder, MD  12/23/2017  11:35 AM

## 2017-12-23 NOTE — Patient Instructions (Signed)
[facility] Discharge Instructions for Patients Receiving Chemotherapy  Today you received the following chemotherapy agents Carboplatin,taxol  To help prevent nausea and vomiting after your treatment, we encourage you to take your nausea medication as directed If you develop nausea and vomiting that is not controlled by your nausea medication, call the clinic.   BELOW ARE SYMPTOMS THAT SHOULD BE REPORTED IMMEDIATELY:  *FEVER GREATER THAN 100.5 F  *CHILLS WITH OR WITHOUT FEVER  NAUSEA AND VOMITING THAT IS NOT CONTROLLED WITH YOUR NAUSEA MEDICATION  *UNUSUAL SHORTNESS OF BREATH  *UNUSUAL BRUISING OR BLEEDING  TENDERNESS IN MOUTH AND THROAT WITH OR WITHOUT PRESENCE OF ULCERS  *URINARY PROBLEMS  *BOWEL PROBLEMS  UNUSUAL RASH Items with * indicate a potential emergency and should be followed up as soon as possible.  Feel free to call the clinic should you have any questions or concerns. The clinic phone number is [phone].  Please show the [place] at check-in to the Emergency Department and triage nurse.

## 2017-12-23 NOTE — Telephone Encounter (Signed)
Scheduled appt per 8/30 los- pt to get an updated schedule in treatment area.

## 2017-12-26 ENCOUNTER — Other Ambulatory Visit: Payer: Self-pay | Admitting: Oncology

## 2017-12-28 ENCOUNTER — Other Ambulatory Visit: Payer: Self-pay | Admitting: Nurse Practitioner

## 2017-12-28 ENCOUNTER — Other Ambulatory Visit: Payer: Self-pay

## 2017-12-28 DIAGNOSIS — C50911 Malignant neoplasm of unspecified site of right female breast: Secondary | ICD-10-CM

## 2017-12-28 MED ORDER — LABETALOL HCL 300 MG PO TABS: 300.0000 mg | ORAL_TABLET | Freq: Two times a day (BID) | ORAL | 0 refills | Status: DC

## 2017-12-30 ENCOUNTER — Inpatient Hospital Stay: Payer: Medicare Other

## 2017-12-30 ENCOUNTER — Inpatient Hospital Stay: Payer: Medicare Other | Attending: Oncology

## 2017-12-30 VITALS — BP 153/89 | HR 96 | Temp 98.1°F | Resp 18

## 2017-12-30 DIAGNOSIS — Z95828 Presence of other vascular implants and grafts: Secondary | ICD-10-CM

## 2017-12-30 DIAGNOSIS — C50811 Malignant neoplasm of overlapping sites of right female breast: Secondary | ICD-10-CM

## 2017-12-30 DIAGNOSIS — C7951 Secondary malignant neoplasm of bone: Secondary | ICD-10-CM | POA: Diagnosis not present

## 2017-12-30 DIAGNOSIS — Z171 Estrogen receptor negative status [ER-]: Secondary | ICD-10-CM | POA: Diagnosis not present

## 2017-12-30 DIAGNOSIS — M1990 Unspecified osteoarthritis, unspecified site: Secondary | ICD-10-CM | POA: Diagnosis not present

## 2017-12-30 DIAGNOSIS — I10 Essential (primary) hypertension: Secondary | ICD-10-CM | POA: Insufficient documentation

## 2017-12-30 DIAGNOSIS — C50911 Malignant neoplasm of unspecified site of right female breast: Secondary | ICD-10-CM

## 2017-12-30 DIAGNOSIS — Z5111 Encounter for antineoplastic chemotherapy: Secondary | ICD-10-CM | POA: Diagnosis not present

## 2017-12-30 DIAGNOSIS — Z79899 Other long term (current) drug therapy: Secondary | ICD-10-CM | POA: Diagnosis not present

## 2017-12-30 DIAGNOSIS — R200 Anesthesia of skin: Secondary | ICD-10-CM | POA: Diagnosis not present

## 2017-12-30 LAB — CBC WITH DIFFERENTIAL (CANCER CENTER ONLY)
BASOS PCT: 0 %
Basophils Absolute: 0 10*3/uL (ref 0.0–0.1)
EOS ABS: 0 10*3/uL (ref 0.0–0.5)
Eosinophils Relative: 1 %
HEMATOCRIT: 23.6 % — AB (ref 34.8–46.6)
HEMOGLOBIN: 7.8 g/dL — AB (ref 11.6–15.9)
LYMPHS ABS: 1.7 10*3/uL (ref 0.9–3.3)
Lymphocytes Relative: 36 %
MCH: 30.4 pg (ref 25.1–34.0)
MCHC: 33.2 g/dL (ref 31.5–36.0)
MCV: 91.7 fL (ref 79.5–101.0)
MONO ABS: 0.3 10*3/uL (ref 0.1–0.9)
MONOS PCT: 6 %
NEUTROS PCT: 57 %
Neutro Abs: 2.8 10*3/uL (ref 1.5–6.5)
Platelet Count: 204 10*3/uL (ref 145–400)
RBC: 2.58 MIL/uL — ABNORMAL LOW (ref 3.70–5.45)
RDW: 22.3 % — AB (ref 11.2–14.5)
WBC Count: 4.8 10*3/uL (ref 3.9–10.3)

## 2017-12-30 MED ORDER — SODIUM CHLORIDE 0.9% FLUSH
10.0000 mL | Freq: Once | INTRAVENOUS | Status: AC
  Administered 2017-12-30: 10 mL
  Filled 2017-12-30: qty 10

## 2017-12-30 MED ORDER — PALONOSETRON HCL INJECTION 0.25 MG/5ML
INTRAVENOUS | Status: AC
  Filled 2017-12-30: qty 5

## 2017-12-30 MED ORDER — DIPHENHYDRAMINE HCL 50 MG/ML IJ SOLN
INTRAMUSCULAR | Status: AC
  Filled 2017-12-30: qty 1

## 2017-12-30 MED ORDER — SODIUM CHLORIDE 0.9 % IV SOLN
243.4000 mg | Freq: Once | INTRAVENOUS | Status: AC
  Administered 2017-12-30: 240 mg via INTRAVENOUS
  Filled 2017-12-30: qty 24

## 2017-12-30 MED ORDER — PALONOSETRON HCL INJECTION 0.25 MG/5ML
0.2500 mg | Freq: Once | INTRAVENOUS | Status: AC
  Administered 2017-12-30: 0.25 mg via INTRAVENOUS

## 2017-12-30 MED ORDER — SODIUM CHLORIDE 0.9 % IV SOLN
80.0000 mg/m2 | Freq: Once | INTRAVENOUS | Status: AC
  Administered 2017-12-30: 150 mg via INTRAVENOUS
  Filled 2017-12-30: qty 25

## 2017-12-30 MED ORDER — DEXAMETHASONE SODIUM PHOSPHATE 10 MG/ML IJ SOLN
INTRAMUSCULAR | Status: AC
  Filled 2017-12-30: qty 1

## 2017-12-30 MED ORDER — HEPARIN SOD (PORK) LOCK FLUSH 100 UNIT/ML IV SOLN
500.0000 [IU] | Freq: Once | INTRAVENOUS | Status: AC | PRN
  Administered 2017-12-30: 500 [IU]
  Filled 2017-12-30: qty 5

## 2017-12-30 MED ORDER — SODIUM CHLORIDE 0.9% FLUSH
10.0000 mL | INTRAVENOUS | Status: DC | PRN
  Administered 2017-12-30: 10 mL
  Filled 2017-12-30: qty 10

## 2017-12-30 MED ORDER — DEXAMETHASONE SODIUM PHOSPHATE 10 MG/ML IJ SOLN
10.0000 mg | Freq: Once | INTRAMUSCULAR | Status: AC
  Administered 2017-12-30: 10 mg via INTRAVENOUS

## 2017-12-30 MED ORDER — SODIUM CHLORIDE 0.9 % IV SOLN
Freq: Once | INTRAVENOUS | Status: AC
  Administered 2017-12-30: 11:00:00 via INTRAVENOUS
  Filled 2017-12-30: qty 250

## 2017-12-30 MED ORDER — DIPHENHYDRAMINE HCL 50 MG/ML IJ SOLN
25.0000 mg | Freq: Once | INTRAMUSCULAR | Status: AC
  Administered 2017-12-30: 25 mg via INTRAVENOUS

## 2017-12-30 MED ORDER — FAMOTIDINE IN NACL 20-0.9 MG/50ML-% IV SOLN
INTRAVENOUS | Status: AC
  Filled 2017-12-30: qty 50

## 2017-12-30 MED ORDER — FAMOTIDINE IN NACL 20-0.9 MG/50ML-% IV SOLN
20.0000 mg | Freq: Once | INTRAVENOUS | Status: AC
  Administered 2017-12-30: 20 mg via INTRAVENOUS

## 2017-12-30 NOTE — Progress Notes (Signed)
Ok to treat with CBC only today and Hb per MD Benay Spice

## 2017-12-30 NOTE — Patient Instructions (Addendum)
[facility] Discharge Instructions for Patients Receiving Chemotherapy  Today you received the following chemotherapy agents Taxol and Carboplatin   To help prevent nausea and vomiting after your treatment, we encourage you to take your nausea medication as directed. No Zofran for 3 days, take Compazine instead.    If you develop nausea and vomiting that is not controlled by your nausea medication, call the clinic.   BELOW ARE SYMPTOMS THAT SHOULD BE REPORTED IMMEDIATELY:  *FEVER GREATER THAN 100.5 F  *CHILLS WITH OR WITHOUT FEVER  NAUSEA AND VOMITING THAT IS NOT CONTROLLED WITH YOUR NAUSEA MEDICATION  *UNUSUAL SHORTNESS OF BREATH  *UNUSUAL BRUISING OR BLEEDING  TENDERNESS IN MOUTH AND THROAT WITH OR WITHOUT PRESENCE OF ULCERS  *URINARY PROBLEMS  *BOWEL PROBLEMS  UNUSUAL RASH Items with * indicate a potential emergency and should be followed up as soon as possible.  Feel free to call the clinic should you have any questions or concerns. The clinic phone number is [phone].  Please show the [place] at check-in to the Emergency Department and triage nurse.

## 2018-01-01 ENCOUNTER — Other Ambulatory Visit: Payer: Self-pay | Admitting: Oncology

## 2018-01-06 ENCOUNTER — Inpatient Hospital Stay: Payer: Medicare Other

## 2018-01-06 VITALS — BP 152/81 | HR 98 | Temp 98.4°F | Resp 18

## 2018-01-06 DIAGNOSIS — C50911 Malignant neoplasm of unspecified site of right female breast: Secondary | ICD-10-CM

## 2018-01-06 DIAGNOSIS — M1990 Unspecified osteoarthritis, unspecified site: Secondary | ICD-10-CM | POA: Diagnosis not present

## 2018-01-06 DIAGNOSIS — C50811 Malignant neoplasm of overlapping sites of right female breast: Secondary | ICD-10-CM

## 2018-01-06 DIAGNOSIS — I10 Essential (primary) hypertension: Secondary | ICD-10-CM | POA: Diagnosis not present

## 2018-01-06 DIAGNOSIS — Z95828 Presence of other vascular implants and grafts: Secondary | ICD-10-CM

## 2018-01-06 DIAGNOSIS — Z5111 Encounter for antineoplastic chemotherapy: Secondary | ICD-10-CM | POA: Diagnosis not present

## 2018-01-06 DIAGNOSIS — Z171 Estrogen receptor negative status [ER-]: Secondary | ICD-10-CM | POA: Diagnosis not present

## 2018-01-06 DIAGNOSIS — C7951 Secondary malignant neoplasm of bone: Secondary | ICD-10-CM | POA: Diagnosis not present

## 2018-01-06 DIAGNOSIS — Z79899 Other long term (current) drug therapy: Secondary | ICD-10-CM | POA: Diagnosis not present

## 2018-01-06 DIAGNOSIS — R200 Anesthesia of skin: Secondary | ICD-10-CM | POA: Diagnosis not present

## 2018-01-06 LAB — CBC WITH DIFFERENTIAL (CANCER CENTER ONLY)
BASOS ABS: 0 10*3/uL (ref 0.0–0.1)
BASOS PCT: 0 %
EOS ABS: 0 10*3/uL (ref 0.0–0.5)
EOS PCT: 1 %
HEMATOCRIT: 23.1 % — AB (ref 34.8–46.6)
Hemoglobin: 7.5 g/dL — ABNORMAL LOW (ref 11.6–15.9)
Lymphocytes Relative: 39 %
Lymphs Abs: 1.5 10*3/uL (ref 0.9–3.3)
MCH: 30.5 pg (ref 25.1–34.0)
MCHC: 32.5 g/dL (ref 31.5–36.0)
MCV: 93.9 fL (ref 79.5–101.0)
Monocytes Absolute: 0.3 10*3/uL (ref 0.1–0.9)
Monocytes Relative: 7 %
NEUTROS ABS: 2.1 10*3/uL (ref 1.5–6.5)
Neutrophils Relative %: 53 %
PLATELETS: 159 10*3/uL (ref 145–400)
RBC: 2.46 MIL/uL — ABNORMAL LOW (ref 3.70–5.45)
RDW: 20.2 % — AB (ref 11.2–14.5)
WBC Count: 3.9 10*3/uL (ref 3.9–10.3)

## 2018-01-06 MED ORDER — FAMOTIDINE IN NACL 20-0.9 MG/50ML-% IV SOLN
INTRAVENOUS | Status: AC
  Filled 2018-01-06: qty 50

## 2018-01-06 MED ORDER — HEPARIN SOD (PORK) LOCK FLUSH 100 UNIT/ML IV SOLN
500.0000 [IU] | Freq: Once | INTRAVENOUS | Status: AC | PRN
  Administered 2018-01-06: 500 [IU]
  Filled 2018-01-06: qty 5

## 2018-01-06 MED ORDER — PALONOSETRON HCL INJECTION 0.25 MG/5ML
INTRAVENOUS | Status: AC
  Filled 2018-01-06: qty 5

## 2018-01-06 MED ORDER — DEXAMETHASONE SODIUM PHOSPHATE 10 MG/ML IJ SOLN
10.0000 mg | Freq: Once | INTRAMUSCULAR | Status: AC
  Administered 2018-01-06: 10 mg via INTRAVENOUS

## 2018-01-06 MED ORDER — SODIUM CHLORIDE 0.9% FLUSH
10.0000 mL | Freq: Once | INTRAVENOUS | Status: AC
  Administered 2018-01-06: 10 mL
  Filled 2018-01-06: qty 10

## 2018-01-06 MED ORDER — DIPHENHYDRAMINE HCL 50 MG/ML IJ SOLN
INTRAMUSCULAR | Status: AC
  Filled 2018-01-06: qty 1

## 2018-01-06 MED ORDER — SODIUM CHLORIDE 0.9% FLUSH
10.0000 mL | INTRAVENOUS | Status: DC | PRN
  Administered 2018-01-06: 10 mL
  Filled 2018-01-06: qty 10

## 2018-01-06 MED ORDER — DEXAMETHASONE SODIUM PHOSPHATE 10 MG/ML IJ SOLN
INTRAMUSCULAR | Status: AC
  Filled 2018-01-06: qty 1

## 2018-01-06 MED ORDER — FAMOTIDINE IN NACL 20-0.9 MG/50ML-% IV SOLN
20.0000 mg | Freq: Once | INTRAVENOUS | Status: AC
  Administered 2018-01-06: 20 mg via INTRAVENOUS

## 2018-01-06 MED ORDER — PALONOSETRON HCL INJECTION 0.25 MG/5ML
0.2500 mg | Freq: Once | INTRAVENOUS | Status: AC
  Administered 2018-01-06: 0.25 mg via INTRAVENOUS

## 2018-01-06 MED ORDER — SODIUM CHLORIDE 0.9 % IV SOLN
243.4000 mg | Freq: Once | INTRAVENOUS | Status: AC
  Administered 2018-01-06: 240 mg via INTRAVENOUS
  Filled 2018-01-06: qty 24

## 2018-01-06 MED ORDER — DIPHENHYDRAMINE HCL 50 MG/ML IJ SOLN
25.0000 mg | Freq: Once | INTRAMUSCULAR | Status: AC
  Administered 2018-01-06: 25 mg via INTRAVENOUS

## 2018-01-06 MED ORDER — SODIUM CHLORIDE 0.9 % IV SOLN
80.0000 mg/m2 | Freq: Once | INTRAVENOUS | Status: AC
  Administered 2018-01-06: 150 mg via INTRAVENOUS
  Filled 2018-01-06: qty 25

## 2018-01-06 MED ORDER — SODIUM CHLORIDE 0.9 % IV SOLN
Freq: Once | INTRAVENOUS | Status: AC
  Administered 2018-01-06: 12:00:00 via INTRAVENOUS
  Filled 2018-01-06: qty 250

## 2018-01-06 NOTE — Patient Instructions (Signed)
[facility] Discharge Instructions for Patients Receiving Chemotherapy  Today you received the following chemotherapy agents Taxol and Carboplatin   To help prevent nausea and vomiting after your treatment, we encourage you to take your nausea medication as directed. No Zofran for 3 days, take Compazine instead.    If you develop nausea and vomiting that is not controlled by your nausea medication, call the clinic.   BELOW ARE SYMPTOMS THAT SHOULD BE REPORTED IMMEDIATELY:  *FEVER GREATER THAN 100.5 F  *CHILLS WITH OR WITHOUT FEVER  NAUSEA AND VOMITING THAT IS NOT CONTROLLED WITH YOUR NAUSEA MEDICATION  *UNUSUAL SHORTNESS OF BREATH  *UNUSUAL BRUISING OR BLEEDING  TENDERNESS IN MOUTH AND THROAT WITH OR WITHOUT PRESENCE OF ULCERS  *URINARY PROBLEMS  *BOWEL PROBLEMS  UNUSUAL RASH Items with * indicate a potential emergency and should be followed up as soon as possible.  Feel free to call the clinic should you have any questions or concerns. The clinic phone number is [phone].  Please show the [place] at check-in to the Emergency Department and triage nurse.

## 2018-01-06 NOTE — Progress Notes (Signed)
Ok to treat with hgb 7.5 and without CMP today per MD Sherrill.

## 2018-01-20 ENCOUNTER — Inpatient Hospital Stay: Payer: Medicare Other

## 2018-01-20 ENCOUNTER — Telehealth: Payer: Self-pay | Admitting: *Deleted

## 2018-01-20 ENCOUNTER — Telehealth: Payer: Self-pay | Admitting: Nurse Practitioner

## 2018-01-20 ENCOUNTER — Inpatient Hospital Stay (HOSPITAL_BASED_OUTPATIENT_CLINIC_OR_DEPARTMENT_OTHER): Payer: Medicare Other | Admitting: Nurse Practitioner

## 2018-01-20 ENCOUNTER — Other Ambulatory Visit (HOSPITAL_COMMUNITY)
Admission: RE | Admit: 2018-01-20 | Discharge: 2018-01-20 | Disposition: A | Discharge status: Home / Self Care | Payer: Medicare Other | Source: Ambulatory Visit | Attending: Oncology | Admitting: Oncology

## 2018-01-20 ENCOUNTER — Encounter: Payer: Self-pay | Admitting: Nurse Practitioner

## 2018-01-20 VITALS — BP 138/80 | HR 100 | Temp 98.2°F | Resp 18 | Ht 63.0 in | Wt 168.5 lb

## 2018-01-20 DIAGNOSIS — C50811 Malignant neoplasm of overlapping sites of right female breast: Secondary | ICD-10-CM

## 2018-01-20 DIAGNOSIS — Z171 Estrogen receptor negative status [ER-]: Secondary | ICD-10-CM

## 2018-01-20 DIAGNOSIS — C7951 Secondary malignant neoplasm of bone: Secondary | ICD-10-CM

## 2018-01-20 DIAGNOSIS — C50911 Malignant neoplasm of unspecified site of right female breast: Secondary | ICD-10-CM

## 2018-01-20 DIAGNOSIS — I10 Essential (primary) hypertension: Secondary | ICD-10-CM

## 2018-01-20 DIAGNOSIS — Z79899 Other long term (current) drug therapy: Secondary | ICD-10-CM | POA: Diagnosis not present

## 2018-01-20 DIAGNOSIS — R200 Anesthesia of skin: Secondary | ICD-10-CM | POA: Diagnosis not present

## 2018-01-20 DIAGNOSIS — Z5111 Encounter for antineoplastic chemotherapy: Secondary | ICD-10-CM | POA: Diagnosis not present

## 2018-01-20 DIAGNOSIS — Z95828 Presence of other vascular implants and grafts: Secondary | ICD-10-CM

## 2018-01-20 DIAGNOSIS — M1990 Unspecified osteoarthritis, unspecified site: Secondary | ICD-10-CM

## 2018-01-20 LAB — CBC WITH DIFFERENTIAL (CANCER CENTER ONLY)
BASOS ABS: 0 10*3/uL (ref 0.0–0.1)
Basophils Relative: 0 %
EOS ABS: 0 10*3/uL (ref 0.0–0.5)
EOS PCT: 1 %
HCT: 24.2 % — ABNORMAL LOW (ref 34.8–46.6)
Hemoglobin: 8 g/dL — ABNORMAL LOW (ref 11.6–15.9)
LYMPHS PCT: 28 %
Lymphs Abs: 1.1 10*3/uL (ref 0.9–3.3)
MCH: 31.6 pg (ref 25.1–34.0)
MCHC: 33 g/dL (ref 31.5–36.0)
MCV: 95.8 fL (ref 79.5–101.0)
MONO ABS: 0.4 10*3/uL (ref 0.1–0.9)
Monocytes Relative: 11 %
Neutro Abs: 2.3 10*3/uL (ref 1.5–6.5)
Neutrophils Relative %: 60 %
PLATELETS: 190 10*3/uL (ref 145–400)
RBC: 2.53 MIL/uL — ABNORMAL LOW (ref 3.70–5.45)
RDW: 21.6 % — ABNORMAL HIGH (ref 11.2–14.5)
WBC: 3.8 10*3/uL — AB (ref 3.9–10.3)

## 2018-01-20 LAB — CMP (CANCER CENTER ONLY)
ALT: 7 U/L (ref 0–44)
AST: 14 U/L — AB (ref 15–41)
Albumin: 3.6 g/dL (ref 3.5–5.0)
Alkaline Phosphatase: 192 U/L — ABNORMAL HIGH (ref 38–126)
Anion gap: 9 (ref 5–15)
BUN: 9 mg/dL (ref 6–20)
CHLORIDE: 108 mmol/L (ref 98–111)
CO2: 23 mmol/L (ref 22–32)
Calcium: 8.9 mg/dL (ref 8.9–10.3)
Creatinine: 0.8 mg/dL (ref 0.44–1.00)
Glucose, Bld: 95 mg/dL (ref 70–99)
POTASSIUM: 4.4 mmol/L (ref 3.5–5.1)
SODIUM: 140 mmol/L (ref 135–145)
Total Bilirubin: 0.4 mg/dL (ref 0.3–1.2)
Total Protein: 7.1 g/dL (ref 6.5–8.1)

## 2018-01-20 MED ORDER — SODIUM CHLORIDE 0.9 % IV SOLN
Freq: Once | INTRAVENOUS | Status: AC
  Administered 2018-01-20: 11:00:00 via INTRAVENOUS
  Filled 2018-01-20: qty 250

## 2018-01-20 MED ORDER — DEXAMETHASONE SODIUM PHOSPHATE 10 MG/ML IJ SOLN
10.0000 mg | Freq: Once | INTRAMUSCULAR | Status: AC
  Administered 2018-01-20: 10 mg via INTRAVENOUS

## 2018-01-20 MED ORDER — SODIUM CHLORIDE 0.9% FLUSH
10.0000 mL | Freq: Once | INTRAVENOUS | Status: AC
  Administered 2018-01-20: 10 mL
  Filled 2018-01-20: qty 10

## 2018-01-20 MED ORDER — DIPHENHYDRAMINE HCL 50 MG/ML IJ SOLN
25.0000 mg | Freq: Once | INTRAMUSCULAR | Status: AC
  Administered 2018-01-20: 25 mg via INTRAVENOUS

## 2018-01-20 MED ORDER — SODIUM CHLORIDE 0.9 % IV SOLN
80.0000 mg/m2 | Freq: Once | INTRAVENOUS | Status: AC
  Administered 2018-01-20: 150 mg via INTRAVENOUS
  Filled 2018-01-20: qty 25

## 2018-01-20 MED ORDER — FAMOTIDINE IN NACL 20-0.9 MG/50ML-% IV SOLN
INTRAVENOUS | Status: AC
  Filled 2018-01-20: qty 50

## 2018-01-20 MED ORDER — SODIUM CHLORIDE 0.9% FLUSH
10.0000 mL | INTRAVENOUS | Status: DC | PRN
  Administered 2018-01-20: 10 mL
  Filled 2018-01-20: qty 10

## 2018-01-20 MED ORDER — DEXAMETHASONE SODIUM PHOSPHATE 10 MG/ML IJ SOLN
INTRAMUSCULAR | Status: AC
  Filled 2018-01-20: qty 1

## 2018-01-20 MED ORDER — LABETALOL HCL 300 MG PO TABS: 300.0000 mg | ORAL_TABLET | Freq: Two times a day (BID) | ORAL | 0 refills | Status: DC

## 2018-01-20 MED ORDER — HEPARIN SOD (PORK) LOCK FLUSH 100 UNIT/ML IV SOLN
500.0000 [IU] | Freq: Once | INTRAVENOUS | Status: DC
  Filled 2018-01-20: qty 5

## 2018-01-20 MED ORDER — SODIUM CHLORIDE 0.9 % IV SOLN
240.0000 mg | Freq: Once | INTRAVENOUS | Status: AC
  Administered 2018-01-20: 240 mg via INTRAVENOUS
  Filled 2018-01-20: qty 24

## 2018-01-20 MED ORDER — HEPARIN SOD (PORK) LOCK FLUSH 100 UNIT/ML IV SOLN
500.0000 [IU] | Freq: Once | INTRAVENOUS | Status: AC | PRN
  Administered 2018-01-20: 500 [IU]
  Filled 2018-01-20: qty 5

## 2018-01-20 MED ORDER — FAMOTIDINE IN NACL 20-0.9 MG/50ML-% IV SOLN
20.0000 mg | Freq: Once | INTRAVENOUS | Status: AC
  Administered 2018-01-20: 20 mg via INTRAVENOUS

## 2018-01-20 MED ORDER — PALONOSETRON HCL INJECTION 0.25 MG/5ML
INTRAVENOUS | Status: AC
  Filled 2018-01-20: qty 5

## 2018-01-20 MED ORDER — PALONOSETRON HCL INJECTION 0.25 MG/5ML
0.2500 mg | Freq: Once | INTRAVENOUS | Status: AC
  Administered 2018-01-20: 0.25 mg via INTRAVENOUS

## 2018-01-20 MED ORDER — DIPHENHYDRAMINE HCL 50 MG/ML IJ SOLN
INTRAMUSCULAR | Status: AC
  Filled 2018-01-20: qty 1

## 2018-01-20 NOTE — Telephone Encounter (Signed)
Olivia Patel Pathology called and PDL-1 ordered

## 2018-01-20 NOTE — Telephone Encounter (Signed)
Scheduled appt per 9/27 los - pt to get an updated schedule in the treatment area.

## 2018-01-20 NOTE — Patient Instructions (Signed)
[facility] Discharge Instructions for Patients Receiving Chemotherapy  Today you received the following chemotherapy agents Taxol, Carboplatin  To help prevent nausea and vomiting after your treatment, we encourage you to take your nausea medication as directed  If you develop nausea and vomiting that is not controlled by your nausea medication, call the clinic.   BELOW ARE SYMPTOMS THAT SHOULD BE REPORTED IMMEDIATELY:  *FEVER GREATER THAN 100.5 F  *CHILLS WITH OR WITHOUT FEVER  NAUSEA AND VOMITING THAT IS NOT CONTROLLED WITH YOUR NAUSEA MEDICATION  *UNUSUAL SHORTNESS OF BREATH  *UNUSUAL BRUISING OR BLEEDING  TENDERNESS IN MOUTH AND THROAT WITH OR WITHOUT PRESENCE OF ULCERS  *URINARY PROBLEMS  *BOWEL PROBLEMS  UNUSUAL RASH Items with * indicate a potential emergency and should be followed up as soon as possible.  Feel free to call the clinic should you have any questions or concerns. The clinic phone number is [phone].  Please show the CHEMO ALERT CARD at check-in to the Emergency Department and triage nurse.   

## 2018-01-20 NOTE — Progress Notes (Signed)
  [place] OFFICE PROGRESS NOTE   Diagnosis: Breast cancer  INTERVAL HISTORY:   Olivia Patel returns as scheduled.  She completed cycle 4 weekly Taxol/carboplatin beginning 12/23/2017.  No nausea or vomiting around the time of chemotherapy.  She had an episode of mild nausea last week which she thinks may have been diet related.  She took Tums and the nausea resolved.  No mouth sores.  No diarrhea or constipation.  She has occasional mild numbness in the feet.  She reports arthritis pain but has no pain at present.  Objective:  Vital signs in last 24 hours:  Blood pressure 138/80, pulse 100, temperature 98.2 F (36.8 C), temperature source Oral, resp. rate 18, height '5\' 3"'$  (1.6 m), weight 168 lb 8 oz (76.4 kg), last menstrual period 05/29/2015, SpO2 100 %.    HEENT: No thrush or ulcers. Resp: Lungs clear bilaterally. Cardio: Regular rate and rhythm. GI: Abdomen soft and nontender.  No hepatomegaly. Vascular: No leg edema. Breast: Right breast with skin thickening mainly at the lower inner quadrant. Port-A-Cath without erythema.  Lab Results:  Lab Results  Component Value Date   WBC 3.8 (L) 01/20/2018   HGB 8.0 (L) 01/20/2018   HCT 24.2 (L) 01/20/2018   MCV 95.8 01/20/2018   PLT 190 01/20/2018   NEUTROABS 2.3 01/20/2018    Imaging:  No results found.  Medications: I have reviewed the patient's current medications.  Assessment/Plan: 1. Metastatic breast cancer  Large right breast mass with diffuse skin thickening  CTs 09/19/2017 with mediastinal/hilar adenopathy, subcarinal adenopathy, widespread bone lesions, right breast mass with skin thickening  MRI of the brain 09/19/2017-negative for brain metastases  Ultrasound-guided biopsy of the right breast mass on 09/21/2017, invasive mammary carcinoma, HER-2 negative, ER negative, PR negative,Ki-67 90%  Weekly Taxol initiated 09/30/2017  Week 2 Taxol 10/07/2017  Week 3 Taxol/carboplatin added  10/14/2017  Cycle 2 weekly Taxol/carboplatin beginning 10/28/2017  Cycle 3 weekly Taxol/carboplatin beginning 11/25/2017  CT chest 12/22/2017-decrease in right breast mass and right axillary lymphadenopathy, unchanged bone metastases, 2 subcentimeter low-attenuation liver lesions- no direct IV contrast CT available for comparison  Cycle 4 weekly Taxol/carboplatin beginning 12/23/2017  Cycle 5 weekly Taxol/carboplatin beginning 01/20/2018 2.Hypercalcemia, status post pamidronate 09/17/2017, calcitonin initiated 09/18/2017, Zometa 09/24/2017,Zometa 10/07/2017;resolved 3.Dehydration secondary to hypercalcemia, resolved 4.Lytic bone lesions, rib fractures secondary to #1 5.Hypertension 6. Renal failure-? Secondary to ATN,resolved 7. [place] hospital admission 09/17/2017-cultures negative, no apparent source for infection, tumor fever? 8. Anemia secondary to metastatic breast cancer and phlebotomy- 1 unit of packed red blood cells 09/30/2017;improved 10/07/2017.Progressive anemia 11/04/2017. Transfused 1 unit of packed red blood cells.  Disposition: Olivia Patel appears stable.  She has completed 4 cycles of weekly Taxol/carboplatin.  There is no clinical evidence of disease progression.  Plan to proceed with cycle 5 today as scheduled.  We reviewed the CBC from today.  Counts are adequate for treatment.  She will return for a follow-up visit in 4 weeks.  She will contact the office in the interim with any problems.    Ned Card ANP/GNP-BC   01/20/2018  10:37 AM

## 2018-01-22 ENCOUNTER — Other Ambulatory Visit: Payer: Self-pay | Admitting: Oncology

## 2018-01-22 LAB — CANCER ANTIGEN 27.29: CAN 27.29: 29.1 U/mL (ref 0.0–38.6)

## 2018-01-26 ENCOUNTER — Inpatient Hospital Stay: Payer: Medicare Other | Admitting: Licensed Clinical Social Worker

## 2018-01-26 ENCOUNTER — Inpatient Hospital Stay: Payer: Medicare Other

## 2018-01-27 ENCOUNTER — Inpatient Hospital Stay: Payer: Medicare Other

## 2018-01-27 ENCOUNTER — Inpatient Hospital Stay: Payer: Medicare Other | Attending: Oncology

## 2018-01-27 VITALS — BP 133/83 | HR 90 | Temp 98.6°F | Resp 18

## 2018-01-27 DIAGNOSIS — Z79899 Other long term (current) drug therapy: Secondary | ICD-10-CM | POA: Insufficient documentation

## 2018-01-27 DIAGNOSIS — C7951 Secondary malignant neoplasm of bone: Secondary | ICD-10-CM | POA: Diagnosis not present

## 2018-01-27 DIAGNOSIS — Z5111 Encounter for antineoplastic chemotherapy: Secondary | ICD-10-CM | POA: Insufficient documentation

## 2018-01-27 DIAGNOSIS — C50811 Malignant neoplasm of overlapping sites of right female breast: Secondary | ICD-10-CM

## 2018-01-27 DIAGNOSIS — Z171 Estrogen receptor negative status [ER-]: Secondary | ICD-10-CM | POA: Diagnosis not present

## 2018-01-27 DIAGNOSIS — Z95828 Presence of other vascular implants and grafts: Secondary | ICD-10-CM

## 2018-01-27 DIAGNOSIS — C50911 Malignant neoplasm of unspecified site of right female breast: Secondary | ICD-10-CM

## 2018-01-27 DIAGNOSIS — R200 Anesthesia of skin: Secondary | ICD-10-CM | POA: Insufficient documentation

## 2018-01-27 DIAGNOSIS — I10 Essential (primary) hypertension: Secondary | ICD-10-CM | POA: Insufficient documentation

## 2018-01-27 LAB — BASIC METABOLIC PANEL - CANCER CENTER ONLY
Anion gap: 10 (ref 5–15)
BUN: 8 mg/dL (ref 6–20)
CO2: 21 mmol/L — ABNORMAL LOW (ref 22–32)
Calcium: 8.8 mg/dL — ABNORMAL LOW (ref 8.9–10.3)
Chloride: 109 mmol/L (ref 98–111)
Creatinine: 0.77 mg/dL (ref 0.44–1.00)
Glucose, Bld: 100 mg/dL — ABNORMAL HIGH (ref 70–99)
POTASSIUM: 3.9 mmol/L (ref 3.5–5.1)
SODIUM: 140 mmol/L (ref 135–145)

## 2018-01-27 LAB — CBC WITH DIFFERENTIAL (CANCER CENTER ONLY)
BASOS ABS: 0 10*3/uL (ref 0.0–0.1)
Basophils Relative: 1 %
EOS PCT: 1 %
Eosinophils Absolute: 0 10*3/uL (ref 0.0–0.5)
HCT: 23.3 % — ABNORMAL LOW (ref 34.8–46.6)
Hemoglobin: 7.9 g/dL — ABNORMAL LOW (ref 11.6–15.9)
LYMPHS PCT: 39 %
Lymphs Abs: 1.4 10*3/uL (ref 0.9–3.3)
MCH: 32.4 pg (ref 25.1–34.0)
MCHC: 34.1 g/dL (ref 31.5–36.0)
MCV: 95 fL (ref 79.5–101.0)
Monocytes Absolute: 0.2 10*3/uL (ref 0.1–0.9)
Monocytes Relative: 6 %
Neutro Abs: 2 10*3/uL (ref 1.5–6.5)
Neutrophils Relative %: 53 %
PLATELETS: 166 10*3/uL (ref 145–400)
RBC: 2.45 MIL/uL — AB (ref 3.70–5.45)
RDW: 20 % — ABNORMAL HIGH (ref 11.2–14.5)
WBC: 3.7 10*3/uL — AB (ref 3.9–10.3)

## 2018-01-27 MED ORDER — SODIUM CHLORIDE 0.9 % IV SOLN
80.0000 mg/m2 | Freq: Once | INTRAVENOUS | Status: AC
  Administered 2018-01-27: 150 mg via INTRAVENOUS
  Filled 2018-01-27: qty 25

## 2018-01-27 MED ORDER — SODIUM CHLORIDE 0.9% FLUSH
10.0000 mL | Freq: Once | INTRAVENOUS | Status: AC
  Administered 2018-01-27: 10 mL
  Filled 2018-01-27: qty 10

## 2018-01-27 MED ORDER — FAMOTIDINE IN NACL 20-0.9 MG/50ML-% IV SOLN
20.0000 mg | Freq: Once | INTRAVENOUS | Status: AC
  Administered 2018-01-27: 20 mg via INTRAVENOUS

## 2018-01-27 MED ORDER — FAMOTIDINE IN NACL 20-0.9 MG/50ML-% IV SOLN
INTRAVENOUS | Status: AC
  Filled 2018-01-27: qty 50

## 2018-01-27 MED ORDER — DEXAMETHASONE SODIUM PHOSPHATE 10 MG/ML IJ SOLN
INTRAMUSCULAR | Status: AC
  Filled 2018-01-27: qty 1

## 2018-01-27 MED ORDER — DIPHENHYDRAMINE HCL 50 MG/ML IJ SOLN
INTRAMUSCULAR | Status: AC
  Filled 2018-01-27: qty 1

## 2018-01-27 MED ORDER — DIPHENHYDRAMINE HCL 50 MG/ML IJ SOLN
25.0000 mg | Freq: Once | INTRAMUSCULAR | Status: AC
  Administered 2018-01-27: 25 mg via INTRAVENOUS

## 2018-01-27 MED ORDER — HEPARIN SOD (PORK) LOCK FLUSH 100 UNIT/ML IV SOLN
500.0000 [IU] | Freq: Once | INTRAVENOUS | Status: DC | PRN
  Filled 2018-01-27: qty 5

## 2018-01-27 MED ORDER — SODIUM CHLORIDE 0.9% FLUSH
10.0000 mL | INTRAVENOUS | Status: DC | PRN
  Filled 2018-01-27: qty 10

## 2018-01-27 MED ORDER — DEXAMETHASONE SODIUM PHOSPHATE 10 MG/ML IJ SOLN
10.0000 mg | Freq: Once | INTRAMUSCULAR | Status: AC
  Administered 2018-01-27: 10 mg via INTRAVENOUS

## 2018-01-27 MED ORDER — PALONOSETRON HCL INJECTION 0.25 MG/5ML
INTRAVENOUS | Status: AC
  Filled 2018-01-27: qty 5

## 2018-01-27 MED ORDER — SODIUM CHLORIDE 0.9 % IV SOLN
243.4000 mg | Freq: Once | INTRAVENOUS | Status: AC
  Administered 2018-01-27: 240 mg via INTRAVENOUS
  Filled 2018-01-27: qty 24

## 2018-01-27 MED ORDER — PALONOSETRON HCL INJECTION 0.25 MG/5ML
0.2500 mg | Freq: Once | INTRAVENOUS | Status: AC
  Administered 2018-01-27: 0.25 mg via INTRAVENOUS

## 2018-01-27 MED ORDER — SODIUM CHLORIDE 0.9 % IV SOLN
Freq: Once | INTRAVENOUS | Status: AC
  Administered 2018-01-27: 09:00:00 via INTRAVENOUS
  Filled 2018-01-27: qty 250

## 2018-01-27 NOTE — Patient Instructions (Signed)
[place] Discharge Instructions for Patients Receiving Chemotherapy  Today you received the following chemotherapy agents Taxol and Carboplatin   To help prevent nausea and vomiting after your treatment, we encourage you to take your nausea medication as directed. No Zofran for 3 days, take Compazine instead.    If you develop nausea and vomiting that is not controlled by your nausea medication, call the clinic.   BELOW ARE SYMPTOMS THAT SHOULD BE REPORTED IMMEDIATELY:  *FEVER GREATER THAN 100.5 F  *CHILLS WITH OR WITHOUT FEVER  NAUSEA AND VOMITING THAT IS NOT CONTROLLED WITH YOUR NAUSEA MEDICATION  *UNUSUAL SHORTNESS OF BREATH  *UNUSUAL BRUISING OR BLEEDING  TENDERNESS IN MOUTH AND THROAT WITH OR WITHOUT PRESENCE OF ULCERS  *URINARY PROBLEMS  *BOWEL PROBLEMS  UNUSUAL RASH Items with * indicate a potential emergency and should be followed up as soon as possible.  Feel free to call the clinic should you have any questions or concerns. The clinic phone number is [phone].  Please show the [place] at check-in to the Emergency Department and triage nurse.

## 2018-01-28 ENCOUNTER — Other Ambulatory Visit: Payer: Self-pay | Admitting: Oncology

## 2018-02-02 ENCOUNTER — Other Ambulatory Visit: Payer: Self-pay | Admitting: *Deleted

## 2018-02-02 DIAGNOSIS — Z95828 Presence of other vascular implants and grafts: Secondary | ICD-10-CM

## 2018-02-02 DIAGNOSIS — C50911 Malignant neoplasm of unspecified site of right female breast: Secondary | ICD-10-CM

## 2018-02-03 ENCOUNTER — Inpatient Hospital Stay: Payer: Medicare Other

## 2018-02-03 VITALS — BP 142/76 | HR 86 | Temp 98.4°F | Resp 19 | Wt 165.8 lb

## 2018-02-03 DIAGNOSIS — C50811 Malignant neoplasm of overlapping sites of right female breast: Secondary | ICD-10-CM

## 2018-02-03 DIAGNOSIS — Z5111 Encounter for antineoplastic chemotherapy: Secondary | ICD-10-CM | POA: Diagnosis not present

## 2018-02-03 DIAGNOSIS — Z79899 Other long term (current) drug therapy: Secondary | ICD-10-CM | POA: Diagnosis not present

## 2018-02-03 DIAGNOSIS — Z171 Estrogen receptor negative status [ER-]: Secondary | ICD-10-CM | POA: Diagnosis not present

## 2018-02-03 DIAGNOSIS — C7951 Secondary malignant neoplasm of bone: Secondary | ICD-10-CM | POA: Diagnosis not present

## 2018-02-03 DIAGNOSIS — C50911 Malignant neoplasm of unspecified site of right female breast: Secondary | ICD-10-CM

## 2018-02-03 DIAGNOSIS — I10 Essential (primary) hypertension: Secondary | ICD-10-CM | POA: Diagnosis not present

## 2018-02-03 DIAGNOSIS — R200 Anesthesia of skin: Secondary | ICD-10-CM | POA: Diagnosis not present

## 2018-02-03 DIAGNOSIS — Z95828 Presence of other vascular implants and grafts: Secondary | ICD-10-CM

## 2018-02-03 LAB — CBC WITH DIFFERENTIAL (CANCER CENTER ONLY)
Abs Immature Granulocytes: 0.01 10*3/uL (ref 0.00–0.07)
BASOS PCT: 0 %
Basophils Absolute: 0 10*3/uL (ref 0.0–0.1)
Eosinophils Absolute: 0 10*3/uL (ref 0.0–0.5)
Eosinophils Relative: 1 %
HCT: 23.6 % — ABNORMAL LOW (ref 36.0–46.0)
Hemoglobin: 7.7 g/dL — ABNORMAL LOW (ref 12.0–15.0)
Immature Granulocytes: 0 %
Lymphocytes Relative: 39 %
Lymphs Abs: 1.3 10*3/uL (ref 0.7–4.0)
MCH: 31.7 pg (ref 26.0–34.0)
MCHC: 32.6 g/dL (ref 30.0–36.0)
MCV: 97.1 fL (ref 80.0–100.0)
MONO ABS: 0.2 10*3/uL (ref 0.1–1.0)
MONOS PCT: 7 %
Neutro Abs: 1.7 10*3/uL (ref 1.7–7.7)
Neutrophils Relative %: 53 %
PLATELETS: 141 10*3/uL — AB (ref 150–400)
RBC: 2.43 MIL/uL — ABNORMAL LOW (ref 3.87–5.11)
RDW: 17.4 % — AB (ref 11.5–15.5)
WBC Count: 3.3 10*3/uL — ABNORMAL LOW (ref 4.0–10.5)
nRBC: 0 % (ref 0.0–0.2)

## 2018-02-03 MED ORDER — SODIUM CHLORIDE 0.9% FLUSH
10.0000 mL | Freq: Once | INTRAVENOUS | Status: AC
  Administered 2018-02-03: 10 mL
  Filled 2018-02-03: qty 10

## 2018-02-03 MED ORDER — PALONOSETRON HCL INJECTION 0.25 MG/5ML
0.2500 mg | Freq: Once | INTRAVENOUS | Status: AC
  Administered 2018-02-03: 0.25 mg via INTRAVENOUS

## 2018-02-03 MED ORDER — HEPARIN SOD (PORK) LOCK FLUSH 100 UNIT/ML IV SOLN
500.0000 [IU] | Freq: Once | INTRAVENOUS | Status: AC | PRN
  Administered 2018-02-03: 500 [IU]
  Filled 2018-02-03: qty 5

## 2018-02-03 MED ORDER — DEXAMETHASONE SODIUM PHOSPHATE 10 MG/ML IJ SOLN
10.0000 mg | Freq: Once | INTRAMUSCULAR | Status: AC
  Administered 2018-02-03: 10 mg via INTRAVENOUS

## 2018-02-03 MED ORDER — DEXAMETHASONE SODIUM PHOSPHATE 10 MG/ML IJ SOLN
INTRAMUSCULAR | Status: AC
  Filled 2018-02-03: qty 1

## 2018-02-03 MED ORDER — SODIUM CHLORIDE 0.9 % IV SOLN
80.0000 mg/m2 | Freq: Once | INTRAVENOUS | Status: AC
  Administered 2018-02-03: 150 mg via INTRAVENOUS
  Filled 2018-02-03: qty 25

## 2018-02-03 MED ORDER — PALONOSETRON HCL INJECTION 0.25 MG/5ML
INTRAVENOUS | Status: AC
  Filled 2018-02-03: qty 5

## 2018-02-03 MED ORDER — SODIUM CHLORIDE 0.9 % IV SOLN
Freq: Once | INTRAVENOUS | Status: AC
  Administered 2018-02-03: 10:00:00 via INTRAVENOUS
  Filled 2018-02-03: qty 250

## 2018-02-03 MED ORDER — FAMOTIDINE IN NACL 20-0.9 MG/50ML-% IV SOLN
20.0000 mg | Freq: Once | INTRAVENOUS | Status: AC
  Administered 2018-02-03: 20 mg via INTRAVENOUS

## 2018-02-03 MED ORDER — FAMOTIDINE IN NACL 20-0.9 MG/50ML-% IV SOLN
INTRAVENOUS | Status: AC
  Filled 2018-02-03: qty 50

## 2018-02-03 MED ORDER — SODIUM CHLORIDE 0.9% FLUSH
10.0000 mL | INTRAVENOUS | Status: DC | PRN
  Administered 2018-02-03: 10 mL
  Filled 2018-02-03: qty 10

## 2018-02-03 MED ORDER — SODIUM CHLORIDE 0.9 % IV SOLN
243.4000 mg | Freq: Once | INTRAVENOUS | Status: AC
  Administered 2018-02-03: 240 mg via INTRAVENOUS
  Filled 2018-02-03: qty 24

## 2018-02-03 MED ORDER — DIPHENHYDRAMINE HCL 50 MG/ML IJ SOLN
INTRAMUSCULAR | Status: AC
  Filled 2018-02-03: qty 1

## 2018-02-03 MED ORDER — DIPHENHYDRAMINE HCL 50 MG/ML IJ SOLN
25.0000 mg | Freq: Once | INTRAMUSCULAR | Status: AC
  Administered 2018-02-03: 25 mg via INTRAVENOUS

## 2018-02-03 NOTE — Patient Instructions (Signed)
[facility] Discharge Instructions for Patients Receiving Chemotherapy  Today you received the following chemotherapy agents Taxol, Carboplatin  To help prevent nausea and vomiting after your treatment, we encourage you to take your nausea medication as directed  If you develop nausea and vomiting that is not controlled by your nausea medication, call the clinic.   BELOW ARE SYMPTOMS THAT SHOULD BE REPORTED IMMEDIATELY:  *FEVER GREATER THAN 100.5 F  *CHILLS WITH OR WITHOUT FEVER  NAUSEA AND VOMITING THAT IS NOT CONTROLLED WITH YOUR NAUSEA MEDICATION  *UNUSUAL SHORTNESS OF BREATH  *UNUSUAL BRUISING OR BLEEDING  TENDERNESS IN MOUTH AND THROAT WITH OR WITHOUT PRESENCE OF ULCERS  *URINARY PROBLEMS  *BOWEL PROBLEMS  UNUSUAL RASH Items with * indicate a potential emergency and should be followed up as soon as possible.  Feel free to call the clinic should you have any questions or concerns. The clinic phone number is [phone].  Please show the CHEMO ALERT CARD at check-in to the Emergency Department and triage nurse.   

## 2018-02-03 NOTE — Progress Notes (Signed)
Per Dr. Benay Spice, ok to treat with 02/03/2018 CBC results and no CMP.

## 2018-02-11 ENCOUNTER — Other Ambulatory Visit: Payer: Self-pay | Admitting: Oncology

## 2018-02-17 ENCOUNTER — Encounter: Payer: Self-pay | Admitting: Nurse Practitioner

## 2018-02-17 ENCOUNTER — Inpatient Hospital Stay: Payer: Medicare Other

## 2018-02-17 ENCOUNTER — Inpatient Hospital Stay (HOSPITAL_BASED_OUTPATIENT_CLINIC_OR_DEPARTMENT_OTHER): Payer: Medicare Other | Admitting: Nurse Practitioner

## 2018-02-17 VITALS — HR 97

## 2018-02-17 VITALS — BP 141/83 | HR 101 | Temp 98.6°F | Resp 17 | Ht 63.0 in | Wt 165.2 lb

## 2018-02-17 DIAGNOSIS — Z5111 Encounter for antineoplastic chemotherapy: Secondary | ICD-10-CM | POA: Diagnosis not present

## 2018-02-17 DIAGNOSIS — I10 Essential (primary) hypertension: Secondary | ICD-10-CM

## 2018-02-17 DIAGNOSIS — C7951 Secondary malignant neoplasm of bone: Secondary | ICD-10-CM | POA: Diagnosis not present

## 2018-02-17 DIAGNOSIS — C50911 Malignant neoplasm of unspecified site of right female breast: Secondary | ICD-10-CM

## 2018-02-17 DIAGNOSIS — Z95828 Presence of other vascular implants and grafts: Secondary | ICD-10-CM

## 2018-02-17 DIAGNOSIS — C50811 Malignant neoplasm of overlapping sites of right female breast: Secondary | ICD-10-CM

## 2018-02-17 DIAGNOSIS — Z79899 Other long term (current) drug therapy: Secondary | ICD-10-CM | POA: Diagnosis not present

## 2018-02-17 DIAGNOSIS — Z171 Estrogen receptor negative status [ER-]: Secondary | ICD-10-CM

## 2018-02-17 DIAGNOSIS — R200 Anesthesia of skin: Secondary | ICD-10-CM | POA: Diagnosis not present

## 2018-02-17 LAB — CBC WITH DIFFERENTIAL (CANCER CENTER ONLY)
ABS IMMATURE GRANULOCYTES: 0.02 10*3/uL (ref 0.00–0.07)
BASOS ABS: 0 10*3/uL (ref 0.0–0.1)
BASOS PCT: 0 %
EOS ABS: 0 10*3/uL (ref 0.0–0.5)
Eosinophils Relative: 1 %
HCT: 25.6 % — ABNORMAL LOW (ref 36.0–46.0)
Hemoglobin: 8.1 g/dL — ABNORMAL LOW (ref 12.0–15.0)
Immature Granulocytes: 1 %
Lymphocytes Relative: 30 %
Lymphs Abs: 1.3 10*3/uL (ref 0.7–4.0)
MCH: 31.5 pg (ref 26.0–34.0)
MCHC: 31.6 g/dL (ref 30.0–36.0)
MCV: 99.6 fL (ref 80.0–100.0)
Monocytes Absolute: 0.5 10*3/uL (ref 0.1–1.0)
Monocytes Relative: 11 %
NEUTROS ABS: 2.5 10*3/uL (ref 1.7–7.7)
NRBC: 0 % (ref 0.0–0.2)
Neutrophils Relative %: 57 %
PLATELETS: 205 10*3/uL (ref 150–400)
RBC: 2.57 MIL/uL — ABNORMAL LOW (ref 3.87–5.11)
RDW: 17.2 % — AB (ref 11.5–15.5)
WBC Count: 4.2 10*3/uL (ref 4.0–10.5)

## 2018-02-17 LAB — CMP (CANCER CENTER ONLY)
ALBUMIN: 3.6 g/dL (ref 3.5–5.0)
ALK PHOS: 167 U/L — AB (ref 38–126)
ALT: 11 U/L (ref 0–44)
ANION GAP: 9 (ref 5–15)
AST: 16 U/L (ref 15–41)
BUN: 11 mg/dL (ref 6–20)
CALCIUM: 9.3 mg/dL (ref 8.9–10.3)
CO2: 24 mmol/L (ref 22–32)
Chloride: 107 mmol/L (ref 98–111)
Creatinine: 0.92 mg/dL (ref 0.44–1.00)
GFR, Estimated: >60 mL/min (ref >60)
Glucose, Bld: 146 mg/dL — ABNORMAL HIGH (ref 70–99)
POTASSIUM: 4.2 mmol/L (ref 3.5–5.1)
SODIUM: 140 mmol/L (ref 135–145)
TOTAL PROTEIN: 7.5 g/dL (ref 6.5–8.1)
Total Bilirubin: 0.4 mg/dL (ref 0.3–1.2)

## 2018-02-17 MED ORDER — FAMOTIDINE IN NACL 20-0.9 MG/50ML-% IV SOLN
INTRAVENOUS | Status: AC
  Filled 2018-02-17: qty 50

## 2018-02-17 MED ORDER — SODIUM CHLORIDE 0.9 % IV SOLN
Freq: Once | INTRAVENOUS | Status: AC
  Administered 2018-02-17: 13:00:00 via INTRAVENOUS
  Filled 2018-02-17: qty 250

## 2018-02-17 MED ORDER — SODIUM CHLORIDE 0.9 % IV SOLN
218.2000 mg | Freq: Once | INTRAVENOUS | Status: AC
  Administered 2018-02-17: 220 mg via INTRAVENOUS
  Filled 2018-02-17: qty 22

## 2018-02-17 MED ORDER — DEXAMETHASONE SODIUM PHOSPHATE 10 MG/ML IJ SOLN
10.0000 mg | Freq: Once | INTRAMUSCULAR | Status: AC
  Administered 2018-02-17: 10 mg via INTRAVENOUS

## 2018-02-17 MED ORDER — SODIUM CHLORIDE 0.9% FLUSH
10.0000 mL | INTRAVENOUS | Status: DC | PRN
  Administered 2018-02-17: 10 mL
  Filled 2018-02-17: qty 10

## 2018-02-17 MED ORDER — PALONOSETRON HCL INJECTION 0.25 MG/5ML
0.2500 mg | Freq: Once | INTRAVENOUS | Status: AC
  Administered 2018-02-17: 0.25 mg via INTRAVENOUS

## 2018-02-17 MED ORDER — PALONOSETRON HCL INJECTION 0.25 MG/5ML
INTRAVENOUS | Status: AC
  Filled 2018-02-17: qty 5

## 2018-02-17 MED ORDER — DIPHENHYDRAMINE HCL 50 MG/ML IJ SOLN
INTRAMUSCULAR | Status: AC
  Filled 2018-02-17: qty 1

## 2018-02-17 MED ORDER — FAMOTIDINE IN NACL 20-0.9 MG/50ML-% IV SOLN
20.0000 mg | Freq: Once | INTRAVENOUS | Status: AC
  Administered 2018-02-17: 20 mg via INTRAVENOUS

## 2018-02-17 MED ORDER — HEPARIN SOD (PORK) LOCK FLUSH 100 UNIT/ML IV SOLN
500.0000 [IU] | Freq: Once | INTRAVENOUS | Status: AC | PRN
  Administered 2018-02-17: 500 [IU]
  Filled 2018-02-17: qty 5

## 2018-02-17 MED ORDER — DIPHENHYDRAMINE HCL 50 MG/ML IJ SOLN
25.0000 mg | Freq: Once | INTRAMUSCULAR | Status: AC
  Administered 2018-02-17: 25 mg via INTRAVENOUS

## 2018-02-17 MED ORDER — SODIUM CHLORIDE 0.9 % IV SOLN
80.0000 mg/m2 | Freq: Once | INTRAVENOUS | Status: AC
  Administered 2018-02-17: 150 mg via INTRAVENOUS
  Filled 2018-02-17: qty 25

## 2018-02-17 MED ORDER — DEXAMETHASONE SODIUM PHOSPHATE 10 MG/ML IJ SOLN
INTRAMUSCULAR | Status: AC
  Filled 2018-02-17: qty 1

## 2018-02-17 MED ORDER — LABETALOL HCL 300 MG PO TABS: 300.0000 mg | ORAL_TABLET | Freq: Two times a day (BID) | ORAL | 1 refills | Status: AC

## 2018-02-17 MED ORDER — SODIUM CHLORIDE 0.9% FLUSH
10.0000 mL | Freq: Once | INTRAVENOUS | Status: AC
  Administered 2018-02-17: 10 mL
  Filled 2018-02-17: qty 10

## 2018-02-17 NOTE — Patient Instructions (Signed)
   [facility] Discharge Instructions for Patients Receiving Chemotherapy  Today you received the following chemotherapy agents Taxol and Carboplatin   To help prevent nausea and vomiting after your treatment, we encourage you to take your nausea medication as directed.    If you develop nausea and vomiting that is not controlled by your nausea medication, call the clinic.   BELOW ARE SYMPTOMS THAT SHOULD BE REPORTED IMMEDIATELY:  *FEVER GREATER THAN 100.5 F  *CHILLS WITH OR WITHOUT FEVER  NAUSEA AND VOMITING THAT IS NOT CONTROLLED WITH YOUR NAUSEA MEDICATION  *UNUSUAL SHORTNESS OF BREATH  *UNUSUAL BRUISING OR BLEEDING  TENDERNESS IN MOUTH AND THROAT WITH OR WITHOUT PRESENCE OF ULCERS  *URINARY PROBLEMS  *BOWEL PROBLEMS  UNUSUAL RASH Items with * indicate a potential emergency and should be followed up as soon as possible.  Feel free to call the clinic should you have any questions or concerns. The clinic phone number is [phone].  Please show the CHEMO ALERT CARD at check-in to the Emergency Department and triage nurse.   

## 2018-02-17 NOTE — Progress Notes (Signed)
  [place] OFFICE PROGRESS NOTE   Diagnosis: Breast cancer  INTERVAL HISTORY:   Ms. Palinkas returns as scheduled.  She completed cycle 5 weekly Taxol/carboplatin beginning 01/20/2018.  She feels she is tolerating the chemotherapy well.  No nausea or vomiting.  No mouth sores.  No diarrhea or constipation.  She had mild intermittent numbness in her feet last week.  None at present.  She recently noted a "knot" in the right breast.  Objective:  Vital signs in last 24 hours:  Blood pressure (!) 141/83, pulse (!) 101, temperature 98.6 F (37 C), temperature source Oral, resp. rate 17, height '5\' 3"'$  (1.6 m), weight 165 lb 3.2 oz (74.9 kg), last menstrual period 05/29/2015, SpO2 100 %.    HEENT: No thrush or ulcers. Lymphatics: No palpable cervical, supraclavicular or axillary lymph nodes. Resp: Lungs clear bilaterally. Cardio: Regular rate and rhythm. GI: Abdomen soft and nontender.  No hepatomegaly. Vascular: No leg edema. Skin: Lower portion of the right breast with skin thickening. Breast: 3 cm mass right breast 8 to 9 o'clock position. Port-A-Cath without erythema.   Lab Results:  Lab Results  Component Value Date   WBC 4.2 02/17/2018   HGB 8.1 (L) 02/17/2018   HCT 25.6 (L) 02/17/2018   MCV 99.6 02/17/2018   PLT 205 02/17/2018   NEUTROABS 2.5 02/17/2018    Imaging:  No results found.  Medications: I have reviewed the patient's current medications.  Assessment/Plan: 1. Metastatic breast cancer  Large right breast mass with diffuse skin thickening  CTs 09/19/2017 with mediastinal/hilar adenopathy, subcarinal adenopathy, widespread bone lesions, right breast mass with skin thickening  MRI of the brain 09/19/2017-negative for brain metastases  Ultrasound-guided biopsy of the right breast mass on 09/21/2017, invasive mammary carcinoma, HER-2 negative, ER negative, PR negative,Ki-67 90%  Weekly Taxol initiated 09/30/2017  Week 2 Taxol 10/07/2017  Week 3  Taxol/carboplatin added 10/14/2017  Cycle 2 weekly Taxol/carboplatin beginning 10/28/2017  Cycle 3 weekly Taxol/carboplatin beginning 11/25/2017  CT chest 12/22/2017-decrease in right breast mass and right axillary lymphadenopathy, unchanged bone metastases, 2 subcentimeter low-attenuation liver lesions- no direct IV contrast CT available for comparison  Cycle 4 weekly Taxol/carboplatin beginning 12/23/2017  Cycle 5 weekly Taxol/carboplatin beginning 01/20/2018  Cycle 6 weekly Taxol/carboplatin beginning 02/17/2018 2.Hypercalcemia, status post pamidronate 09/17/2017, calcitonin initiated 09/18/2017, Zometa 09/24/2017,Zometa 10/07/2017;resolved 3.Dehydration secondary to hypercalcemia, resolved 4.Lytic bone lesions, rib fractures secondary to #1 5.Hypertension 6. Renal failure-? Secondary to ATN,resolved 7. [facility] hospital admission 09/17/2017-cultures negative, no apparent source for infection, tumor fever? 8. Anemia secondary to metastatic breast cancer and phlebotomy- 1 unit of packed red blood cells 09/30/2017;improved 10/07/2017.Progressive anemia 11/04/2017. Transfused 1 unit of packed red blood cells.  Disposition: Ms. Mcdaniel has completed 5 cycles of weekly Taxol/carboplatin.  Her performance status continues to be improved.  Plan to proceed with cycle 6 today as scheduled.  We reviewed the CBC from today.  Counts are adequate for treatment.  The right breast mass may be more discrete due to marked improvement in breast edema.  We will continue to monitor.  We will see her in follow-up in 2 weeks.  She will contact the office in the interim with any problems.    Ned Card ANP/GNP-BC   02/17/2018  12:37 PM

## 2018-02-18 LAB — CANCER ANTIGEN 27.29: CAN 27.29: 32.6 U/mL (ref 0.0–38.6)

## 2018-02-24 ENCOUNTER — Inpatient Hospital Stay: Payer: Medicare Other | Attending: Oncology

## 2018-02-24 ENCOUNTER — Inpatient Hospital Stay: Payer: Medicare Other

## 2018-02-24 VITALS — BP 131/85 | HR 95 | Temp 98.6°F | Resp 16

## 2018-02-24 DIAGNOSIS — C50911 Malignant neoplasm of unspecified site of right female breast: Secondary | ICD-10-CM

## 2018-02-24 DIAGNOSIS — Z5111 Encounter for antineoplastic chemotherapy: Secondary | ICD-10-CM | POA: Diagnosis not present

## 2018-02-24 DIAGNOSIS — Z79899 Other long term (current) drug therapy: Secondary | ICD-10-CM | POA: Insufficient documentation

## 2018-02-24 DIAGNOSIS — R110 Nausea: Secondary | ICD-10-CM | POA: Insufficient documentation

## 2018-02-24 DIAGNOSIS — C50811 Malignant neoplasm of overlapping sites of right female breast: Secondary | ICD-10-CM

## 2018-02-24 DIAGNOSIS — K5900 Constipation, unspecified: Secondary | ICD-10-CM | POA: Diagnosis not present

## 2018-02-24 DIAGNOSIS — C7951 Secondary malignant neoplasm of bone: Secondary | ICD-10-CM | POA: Diagnosis not present

## 2018-02-24 DIAGNOSIS — Z171 Estrogen receptor negative status [ER-]: Secondary | ICD-10-CM

## 2018-02-24 DIAGNOSIS — I10 Essential (primary) hypertension: Secondary | ICD-10-CM | POA: Diagnosis not present

## 2018-02-24 DIAGNOSIS — Z95828 Presence of other vascular implants and grafts: Secondary | ICD-10-CM

## 2018-02-24 LAB — CBC WITH DIFFERENTIAL (CANCER CENTER ONLY)
ABS IMMATURE GRANULOCYTES: 0.04 10*3/uL (ref 0.00–0.07)
Basophils Absolute: 0 10*3/uL (ref 0.0–0.1)
Basophils Relative: 0 %
Eosinophils Absolute: 0 10*3/uL (ref 0.0–0.5)
Eosinophils Relative: 1 %
HCT: 25.1 % — ABNORMAL LOW (ref 36.0–46.0)
HEMOGLOBIN: 8.2 g/dL — AB (ref 12.0–15.0)
Immature Granulocytes: 1 %
LYMPHS PCT: 34 %
Lymphs Abs: 1.6 10*3/uL (ref 0.7–4.0)
MCH: 31.5 pg (ref 26.0–34.0)
MCHC: 32.7 g/dL (ref 30.0–36.0)
MCV: 96.5 fL (ref 80.0–100.0)
MONO ABS: 0.3 10*3/uL (ref 0.1–1.0)
Monocytes Relative: 7 %
NEUTROS ABS: 2.7 10*3/uL (ref 1.7–7.7)
NEUTROS PCT: 57 %
Platelet Count: 200 10*3/uL (ref 150–400)
RBC: 2.6 MIL/uL — ABNORMAL LOW (ref 3.87–5.11)
RDW: 16.9 % — ABNORMAL HIGH (ref 11.5–15.5)
WBC Count: 4.7 10*3/uL (ref 4.0–10.5)
nRBC: 0 % (ref 0.0–0.2)

## 2018-02-24 MED ORDER — DIPHENHYDRAMINE HCL 50 MG/ML IJ SOLN
25.0000 mg | Freq: Once | INTRAMUSCULAR | Status: AC
  Administered 2018-02-24: 25 mg via INTRAVENOUS

## 2018-02-24 MED ORDER — DEXAMETHASONE SODIUM PHOSPHATE 10 MG/ML IJ SOLN
INTRAMUSCULAR | Status: AC
  Filled 2018-02-24: qty 1

## 2018-02-24 MED ORDER — SODIUM CHLORIDE 0.9 % IV SOLN
80.0000 mg/m2 | Freq: Once | INTRAVENOUS | Status: AC
  Administered 2018-02-24: 150 mg via INTRAVENOUS
  Filled 2018-02-24: qty 25

## 2018-02-24 MED ORDER — PALONOSETRON HCL INJECTION 0.25 MG/5ML
INTRAVENOUS | Status: AC
  Filled 2018-02-24: qty 5

## 2018-02-24 MED ORDER — SODIUM CHLORIDE 0.9 % IV SOLN
Freq: Once | INTRAVENOUS | Status: AC
  Administered 2018-02-24: 09:00:00 via INTRAVENOUS
  Filled 2018-02-24: qty 250

## 2018-02-24 MED ORDER — SODIUM CHLORIDE 0.9% FLUSH
10.0000 mL | Freq: Once | INTRAVENOUS | Status: AC
  Administered 2018-02-24: 10 mL
  Filled 2018-02-24: qty 10

## 2018-02-24 MED ORDER — DEXAMETHASONE SODIUM PHOSPHATE 10 MG/ML IJ SOLN
10.0000 mg | Freq: Once | INTRAMUSCULAR | Status: AC
  Administered 2018-02-24: 10 mg via INTRAVENOUS

## 2018-02-24 MED ORDER — DIPHENHYDRAMINE HCL 50 MG/ML IJ SOLN
INTRAMUSCULAR | Status: AC
  Filled 2018-02-24: qty 1

## 2018-02-24 MED ORDER — FAMOTIDINE IN NACL 20-0.9 MG/50ML-% IV SOLN
20.0000 mg | Freq: Once | INTRAVENOUS | Status: AC
  Administered 2018-02-24: 20 mg via INTRAVENOUS

## 2018-02-24 MED ORDER — SODIUM CHLORIDE 0.9% FLUSH
10.0000 mL | INTRAVENOUS | Status: DC | PRN
  Administered 2018-02-24: 10 mL
  Filled 2018-02-24: qty 10

## 2018-02-24 MED ORDER — SODIUM CHLORIDE 0.9 % IV SOLN
218.2000 mg | Freq: Once | INTRAVENOUS | Status: AC
  Administered 2018-02-24: 220 mg via INTRAVENOUS
  Filled 2018-02-24: qty 22

## 2018-02-24 MED ORDER — FAMOTIDINE IN NACL 20-0.9 MG/50ML-% IV SOLN
INTRAVENOUS | Status: AC
  Filled 2018-02-24: qty 50

## 2018-02-24 MED ORDER — HEPARIN SOD (PORK) LOCK FLUSH 100 UNIT/ML IV SOLN
500.0000 [IU] | Freq: Once | INTRAVENOUS | Status: AC | PRN
  Administered 2018-02-24: 500 [IU]
  Filled 2018-02-24: qty 5

## 2018-02-24 MED ORDER — PALONOSETRON HCL INJECTION 0.25 MG/5ML
0.2500 mg | Freq: Once | INTRAVENOUS | Status: AC
  Administered 2018-02-24: 0.25 mg via INTRAVENOUS

## 2018-02-24 NOTE — Patient Instructions (Signed)
   [facility] Discharge Instructions for Patients Receiving Chemotherapy  Today you received the following chemotherapy agents Taxol and Carboplatin   To help prevent nausea and vomiting after your treatment, we encourage you to take your nausea medication as directed.    If you develop nausea and vomiting that is not controlled by your nausea medication, call the clinic.   BELOW ARE SYMPTOMS THAT SHOULD BE REPORTED IMMEDIATELY:  *FEVER GREATER THAN 100.5 F  *CHILLS WITH OR WITHOUT FEVER  NAUSEA AND VOMITING THAT IS NOT CONTROLLED WITH YOUR NAUSEA MEDICATION  *UNUSUAL SHORTNESS OF BREATH  *UNUSUAL BRUISING OR BLEEDING  TENDERNESS IN MOUTH AND THROAT WITH OR WITHOUT PRESENCE OF ULCERS  *URINARY PROBLEMS  *BOWEL PROBLEMS  UNUSUAL RASH Items with * indicate a potential emergency and should be followed up as soon as possible.  Feel free to call the clinic should you have any questions or concerns. The clinic phone number is [phone].  Please show the CHEMO ALERT CARD at check-in to the Emergency Department and triage nurse.   

## 2018-02-24 NOTE — Progress Notes (Signed)
Okay to proceed with Taxol and Carboplatin today with CBC lab only per Dr. Benay Spice.

## 2018-03-03 ENCOUNTER — Inpatient Hospital Stay (HOSPITAL_BASED_OUTPATIENT_CLINIC_OR_DEPARTMENT_OTHER): Payer: Medicare Other | Admitting: Nurse Practitioner

## 2018-03-03 ENCOUNTER — Encounter: Payer: Self-pay | Admitting: Nurse Practitioner

## 2018-03-03 ENCOUNTER — Inpatient Hospital Stay: Payer: Medicare Other

## 2018-03-03 ENCOUNTER — Telehealth: Payer: Self-pay | Admitting: Nurse Practitioner

## 2018-03-03 VITALS — BP 161/84 | HR 107 | Temp 98.5°F | Resp 18 | Ht 63.0 in | Wt 164.7 lb

## 2018-03-03 VITALS — HR 97

## 2018-03-03 DIAGNOSIS — Z171 Estrogen receptor negative status [ER-]: Secondary | ICD-10-CM

## 2018-03-03 DIAGNOSIS — Z79899 Other long term (current) drug therapy: Secondary | ICD-10-CM | POA: Diagnosis not present

## 2018-03-03 DIAGNOSIS — I10 Essential (primary) hypertension: Secondary | ICD-10-CM | POA: Diagnosis not present

## 2018-03-03 DIAGNOSIS — Z7951 Long term (current) use of inhaled steroids: Secondary | ICD-10-CM

## 2018-03-03 DIAGNOSIS — C50811 Malignant neoplasm of overlapping sites of right female breast: Secondary | ICD-10-CM

## 2018-03-03 DIAGNOSIS — C50911 Malignant neoplasm of unspecified site of right female breast: Secondary | ICD-10-CM

## 2018-03-03 DIAGNOSIS — Z95828 Presence of other vascular implants and grafts: Secondary | ICD-10-CM

## 2018-03-03 DIAGNOSIS — K5900 Constipation, unspecified: Secondary | ICD-10-CM

## 2018-03-03 DIAGNOSIS — Z5111 Encounter for antineoplastic chemotherapy: Secondary | ICD-10-CM | POA: Diagnosis not present

## 2018-03-03 DIAGNOSIS — R110 Nausea: Secondary | ICD-10-CM | POA: Diagnosis not present

## 2018-03-03 DIAGNOSIS — C7951 Secondary malignant neoplasm of bone: Secondary | ICD-10-CM | POA: Diagnosis not present

## 2018-03-03 LAB — COMPREHENSIVE METABOLIC PANEL
ALK PHOS: 159 U/L — AB (ref 38–126)
ALT: 8 U/L (ref 0–44)
AST: 18 U/L (ref 15–41)
Albumin: 3.7 g/dL (ref 3.5–5.0)
Anion gap: 10 (ref 5–15)
BILIRUBIN TOTAL: 0.3 mg/dL (ref 0.3–1.2)
BUN: 13 mg/dL (ref 6–20)
CALCIUM: 9.4 mg/dL (ref 8.9–10.3)
CHLORIDE: 107 mmol/L (ref 98–111)
CO2: 24 mmol/L (ref 22–32)
CREATININE: 0.91 mg/dL (ref 0.44–1.00)
GFR calc non Af Amer: >60 mL/min (ref >60)
Glucose, Bld: 132 mg/dL — ABNORMAL HIGH (ref 70–99)
Potassium: 3.9 mmol/L (ref 3.5–5.1)
Sodium: 141 mmol/L (ref 135–145)
TOTAL PROTEIN: 7.5 g/dL (ref 6.5–8.1)

## 2018-03-03 LAB — CBC WITH DIFFERENTIAL (CANCER CENTER ONLY)
ABS IMMATURE GRANULOCYTES: 0.03 10*3/uL (ref 0.00–0.07)
Basophils Absolute: 0 10*3/uL (ref 0.0–0.1)
Basophils Relative: 0 %
Eosinophils Absolute: 0.1 10*3/uL (ref 0.0–0.5)
Eosinophils Relative: 1 %
HEMATOCRIT: 25.8 % — AB (ref 36.0–46.0)
Hemoglobin: 8.2 g/dL — ABNORMAL LOW (ref 12.0–15.0)
IMMATURE GRANULOCYTES: 1 %
LYMPHS ABS: 1.6 10*3/uL (ref 0.7–4.0)
Lymphocytes Relative: 33 %
MCH: 31.3 pg (ref 26.0–34.0)
MCHC: 31.8 g/dL (ref 30.0–36.0)
MCV: 98.5 fL (ref 80.0–100.0)
MONO ABS: 0.3 10*3/uL (ref 0.1–1.0)
MONOS PCT: 7 %
NEUTROS ABS: 2.8 10*3/uL (ref 1.7–7.7)
NEUTROS PCT: 58 %
Platelet Count: 189 10*3/uL (ref 150–400)
RBC: 2.62 MIL/uL — ABNORMAL LOW (ref 3.87–5.11)
RDW: 17.4 % — ABNORMAL HIGH (ref 11.5–15.5)
WBC Count: 4.8 10*3/uL (ref 4.0–10.5)
nRBC: 0 % (ref 0.0–0.2)

## 2018-03-03 MED ORDER — PALONOSETRON HCL INJECTION 0.25 MG/5ML
INTRAVENOUS | Status: AC
  Filled 2018-03-03: qty 5

## 2018-03-03 MED ORDER — DEXAMETHASONE SODIUM PHOSPHATE 10 MG/ML IJ SOLN
INTRAMUSCULAR | Status: AC
  Filled 2018-03-03: qty 1

## 2018-03-03 MED ORDER — PALONOSETRON HCL INJECTION 0.25 MG/5ML
0.2500 mg | Freq: Once | INTRAVENOUS | Status: AC
  Administered 2018-03-03: 0.25 mg via INTRAVENOUS

## 2018-03-03 MED ORDER — HEPARIN SOD (PORK) LOCK FLUSH 100 UNIT/ML IV SOLN
500.0000 [IU] | Freq: Once | INTRAVENOUS | Status: AC | PRN
  Administered 2018-03-03: 500 [IU]
  Filled 2018-03-03: qty 5

## 2018-03-03 MED ORDER — SODIUM CHLORIDE 0.9 % IV SOLN
80.0000 mg/m2 | Freq: Once | INTRAVENOUS | Status: AC
  Administered 2018-03-03: 150 mg via INTRAVENOUS
  Filled 2018-03-03: qty 25

## 2018-03-03 MED ORDER — DIPHENHYDRAMINE HCL 50 MG/ML IJ SOLN
25.0000 mg | Freq: Once | INTRAMUSCULAR | Status: AC
  Administered 2018-03-03: 25 mg via INTRAVENOUS

## 2018-03-03 MED ORDER — DIPHENHYDRAMINE HCL 50 MG/ML IJ SOLN
INTRAMUSCULAR | Status: AC
  Filled 2018-03-03: qty 1

## 2018-03-03 MED ORDER — FAMOTIDINE IN NACL 20-0.9 MG/50ML-% IV SOLN: 20.0000 mg | Freq: Once | INTRAVENOUS | Status: DC

## 2018-03-03 MED ORDER — SODIUM CHLORIDE 0.9 % IV SOLN
Freq: Once | INTRAVENOUS | Status: AC
  Administered 2018-03-03: 11:00:00 via INTRAVENOUS
  Filled 2018-03-03: qty 250

## 2018-03-03 MED ORDER — SODIUM CHLORIDE 0.9 % IV SOLN
220.0000 mg | Freq: Once | INTRAVENOUS | Status: AC
  Administered 2018-03-03: 220 mg via INTRAVENOUS
  Filled 2018-03-03: qty 22

## 2018-03-03 MED ORDER — SODIUM CHLORIDE 0.9% FLUSH
10.0000 mL | Freq: Once | INTRAVENOUS | Status: AC
  Administered 2018-03-03: 10 mL
  Filled 2018-03-03: qty 10

## 2018-03-03 MED ORDER — SODIUM CHLORIDE 0.9 % IV SOLN
20.0000 mg | Freq: Once | INTRAVENOUS | Status: AC
  Administered 2018-03-03: 20 mg via INTRAVENOUS
  Filled 2018-03-03: qty 2

## 2018-03-03 MED ORDER — DEXAMETHASONE SODIUM PHOSPHATE 10 MG/ML IJ SOLN
10.0000 mg | Freq: Once | INTRAMUSCULAR | Status: AC
  Administered 2018-03-03: 10 mg via INTRAVENOUS

## 2018-03-03 MED ORDER — SODIUM CHLORIDE 0.9% FLUSH
10.0000 mL | INTRAVENOUS | Status: DC | PRN
  Administered 2018-03-03: 10 mL
  Filled 2018-03-03: qty 10

## 2018-03-03 NOTE — Telephone Encounter (Signed)
Gave pt avs and calendar  °

## 2018-03-03 NOTE — Progress Notes (Addendum)
[facility] OFFICE PROGRESS NOTE   Diagnosis: Breast cancer  INTERVAL HISTORY:   Olivia Patel returns as scheduled.  She completed cycle 6-day 8 Taxol/carboplatin 02/24/2018.  She feels she is tolerating the chemotherapy well.  She has very occasional mild nausea.  No mouth sores.  No diarrhea.  She had recent mild constipation.  She had numbness in her toes for a few days after the most recent treatment.  None at present.  She thinks the right breast mass overall is stable.  Objective:  Vital signs in last 24 hours:  Blood pressure (!) 161/84, pulse (!) 107, temperature 98.5 F (36.9 C), temperature source Oral, resp. rate 18, height '5\' 3"'$  (1.6 m), weight 164 lb 11.2 oz (74.7 kg), last menstrual period 05/29/2015, SpO2 100 %.    HEENT: No thrush or ulcers. Lymphatics: No palpable right axillary lymph nodes. Resp: Lungs clear bilaterally. Cardio: Regular rate and rhythm. GI: Abdomen soft and nontender.  No hepatomegaly. Vascular: No leg edema. Neuro: Vibratory sense mildly decreased over the fingertips per tuning fork exam. Skin: No rash. Breast: The right breast is edematous with skin thickening.  Palpable mass in the outer quadrants.  Size is difficult to estimate due to the edema. Port-A-Cath without erythema.  Lab Results:  Lab Results  Component Value Date   WBC 4.8 03/03/2018   HGB 8.2 (L) 03/03/2018   HCT 25.8 (L) 03/03/2018   MCV 98.5 03/03/2018   PLT 189 03/03/2018   NEUTROABS 2.8 03/03/2018    Imaging:  No results found.  Medications: I have reviewed the patient's current medications.  Assessment/Plan: 1. Metastatic breast cancer  Large right breast mass with diffuse skin thickening  CTs 09/19/2017 with mediastinal/hilar adenopathy, subcarinal adenopathy, widespread bone lesions, right breast mass with skin thickening  MRI of the brain 09/19/2017-negative for brain metastases  Ultrasound-guided biopsy of the right breast mass on 09/21/2017,  invasive mammary carcinoma, HER-2 negative, ER negative, PR negative,Ki-67 90%  Weekly Taxol initiated 09/30/2017  Week 2 Taxol 10/07/2017  Week 3 Taxol/carboplatin added 10/14/2017  Cycle 2 weekly Taxol/carboplatin beginning 10/28/2017  Cycle 3 weekly Taxol/carboplatin beginning 11/25/2017  CT chest 12/22/2017-decrease in right breast mass and right axillary lymphadenopathy, unchanged bone metastases, 2 subcentimeter low-attenuation liver lesions- no direct IV contrast CT available for comparison  Cycle 4 weekly Taxol/carboplatin beginning 12/23/2017  Cycle 5 weekly Taxol/carboplatin beginning 01/20/2018  Cycle 6 weekly Taxol/carboplatin beginning 02/17/2018 2.Hypercalcemia, status post pamidronate 09/17/2017, calcitonin initiated 09/18/2017, Zometa 09/24/2017,Zometa 10/07/2017;resolved 3.Dehydration secondary to hypercalcemia, resolved 4.Lytic bone lesions, rib fractures secondary to #1 5.Hypertension 6. Renal failure-? Secondary to ATN,resolved 7. [facility] hospital admission 09/17/2017-cultures negative, no apparent source for infection, tumor fever? 8. Anemia secondary to metastatic breast cancer and phlebotomy- 1 unit of packed red blood cells 09/30/2017;improved 10/07/2017.Progressive anemia 11/04/2017. Transfused 1 unit of packed red blood cells.   Disposition: Olivia Patel appears stable.  She is completing cycle 6 weekly Taxol/carboplatin.  Plan to proceed with day 15 today as scheduled.  We reviewed the CBC from today.  Counts are adequate for treatment.  We are making a referral to Dr. Barry Dienes to consider possible mastectomy.  She will return for lab, follow-up and Taxol/carboplatin in 2 weeks.  She will contact the office in the interim with any problems.  Patient seen with Dr. Benay Spice.  Ned Card ANP/GNP-BC   03/03/2018  9:37 AM  This was a shared visit with Ned Card.  Olivia Patel was interviewed and examined.  She continues to tolerate  the chemotherapy well.   There is no evidence of disease progression.  There is a remaining mass in the right breast.  We will refer her to Dr. Barry Dienes to consider a partial or full mastectomy.  Julieanne Manson, MD

## 2018-03-03 NOTE — Patient Instructions (Signed)
   [facility] Discharge Instructions for Patients Receiving Chemotherapy  Today you received the following chemotherapy agents Taxol and Carboplatin   To help prevent nausea and vomiting after your treatment, we encourage you to take your nausea medication as directed.    If you develop nausea and vomiting that is not controlled by your nausea medication, call the clinic.   BELOW ARE SYMPTOMS THAT SHOULD BE REPORTED IMMEDIATELY:  *FEVER GREATER THAN 100.5 F  *CHILLS WITH OR WITHOUT FEVER  NAUSEA AND VOMITING THAT IS NOT CONTROLLED WITH YOUR NAUSEA MEDICATION  *UNUSUAL SHORTNESS OF BREATH  *UNUSUAL BRUISING OR BLEEDING  TENDERNESS IN MOUTH AND THROAT WITH OR WITHOUT PRESENCE OF ULCERS  *URINARY PROBLEMS  *BOWEL PROBLEMS  UNUSUAL RASH Items with * indicate a potential emergency and should be followed up as soon as possible.  Feel free to call the clinic should you have any questions or concerns. The clinic phone number is [phone].  Please show the CHEMO ALERT CARD at check-in to the Emergency Department and triage nurse.   

## 2018-03-05 ENCOUNTER — Encounter (HOSPITAL_COMMUNITY): Payer: Self-pay

## 2018-03-05 ENCOUNTER — Other Ambulatory Visit: Payer: Self-pay

## 2018-03-05 ENCOUNTER — Emergency Department (HOSPITAL_COMMUNITY)
Admission: EM | Admit: 2018-03-05 | Discharge: 2018-03-05 | Disposition: A | Discharge status: Home / Self Care | Payer: Medicare Other | Attending: Emergency Medicine | Admitting: Emergency Medicine

## 2018-03-05 ENCOUNTER — Emergency Department (HOSPITAL_COMMUNITY): Payer: Medicare Other

## 2018-03-05 DIAGNOSIS — R000 Tachycardia, unspecified: Secondary | ICD-10-CM | POA: Diagnosis not present

## 2018-03-05 DIAGNOSIS — R0981 Nasal congestion: Secondary | ICD-10-CM | POA: Diagnosis not present

## 2018-03-05 DIAGNOSIS — J069 Acute upper respiratory infection, unspecified: Secondary | ICD-10-CM | POA: Insufficient documentation

## 2018-03-05 DIAGNOSIS — Z853 Personal history of malignant neoplasm of breast: Secondary | ICD-10-CM | POA: Insufficient documentation

## 2018-03-05 DIAGNOSIS — R05 Cough: Secondary | ICD-10-CM | POA: Diagnosis not present

## 2018-03-05 DIAGNOSIS — I10 Essential (primary) hypertension: Secondary | ICD-10-CM | POA: Insufficient documentation

## 2018-03-05 DIAGNOSIS — Z79899 Other long term (current) drug therapy: Secondary | ICD-10-CM | POA: Diagnosis not present

## 2018-03-05 LAB — URINALYSIS, ROUTINE W REFLEX MICROSCOPIC
BACTERIA UA: NONE SEEN
Bilirubin Urine: NEGATIVE
Glucose, UA: NEGATIVE mg/dL
Hgb urine dipstick: NEGATIVE
Ketones, ur: 5 mg/dL — AB
Nitrite: NEGATIVE
PROTEIN: NEGATIVE mg/dL
Specific Gravity, Urine: 1.018 (ref 1.005–1.030)
pH: 5 (ref 5.0–8.0)

## 2018-03-05 LAB — CBC WITH DIFFERENTIAL/PLATELET
ABS IMMATURE GRANULOCYTES: 0.03 10*3/uL (ref 0.00–0.07)
BASOS ABS: 0 10*3/uL (ref 0.0–0.1)
Basophils Relative: 0 %
EOS PCT: 0 %
Eosinophils Absolute: 0 10*3/uL (ref 0.0–0.5)
HEMATOCRIT: 25.3 % — AB (ref 36.0–46.0)
Hemoglobin: 8 g/dL — ABNORMAL LOW (ref 12.0–15.0)
Immature Granulocytes: 1 %
LYMPHS ABS: 1.2 10*3/uL (ref 0.7–4.0)
LYMPHS PCT: 25 %
MCH: 31.9 pg (ref 26.0–34.0)
MCHC: 31.6 g/dL (ref 30.0–36.0)
MCV: 100.8 fL — ABNORMAL HIGH (ref 80.0–100.0)
MONO ABS: 0.3 10*3/uL (ref 0.1–1.0)
Monocytes Relative: 7 %
NEUTROS ABS: 3.2 10*3/uL (ref 1.7–7.7)
NRBC: 0 % (ref 0.0–0.2)
Neutrophils Relative %: 67 %
Platelets: 192 10*3/uL (ref 150–400)
RBC: 2.51 MIL/uL — ABNORMAL LOW (ref 3.87–5.11)
RDW: 17.9 % — ABNORMAL HIGH (ref 11.5–15.5)
WBC: 4.7 10*3/uL (ref 4.0–10.5)

## 2018-03-05 LAB — COMPREHENSIVE METABOLIC PANEL
ALBUMIN: 3.8 g/dL (ref 3.5–5.0)
ALT: 17 U/L (ref 0–44)
ANION GAP: 9 (ref 5–15)
AST: 29 U/L (ref 15–41)
Alkaline Phosphatase: 130 U/L — ABNORMAL HIGH (ref 38–126)
BILIRUBIN TOTAL: 0.8 mg/dL (ref 0.3–1.2)
BUN: 15 mg/dL (ref 6–20)
CALCIUM: 8.7 mg/dL — AB (ref 8.9–10.3)
CO2: 22 mmol/L (ref 22–32)
Chloride: 105 mmol/L (ref 98–111)
Creatinine, Ser: 0.78 mg/dL (ref 0.44–1.00)
GFR calc Af Amer: >60 mL/min (ref >60)
GFR calc non Af Amer: >60 mL/min (ref >60)
GLUCOSE: 99 mg/dL (ref 70–99)
Potassium: 3.4 mmol/L — ABNORMAL LOW (ref 3.5–5.1)
SODIUM: 136 mmol/L (ref 135–145)
TOTAL PROTEIN: 7.3 g/dL (ref 6.5–8.1)

## 2018-03-05 LAB — INFLUENZA PANEL BY PCR (TYPE A & B)
INFLAPCR: NEGATIVE
Influenza B By PCR: NEGATIVE

## 2018-03-05 MED ORDER — SODIUM CHLORIDE 0.9 % IV SOLN
Freq: Once | INTRAVENOUS | Status: AC
  Administered 2018-03-05: 12:00:00 via INTRAVENOUS

## 2018-03-05 MED ORDER — HEPARIN SOD (PORK) LOCK FLUSH 100 UNIT/ML IV SOLN
500.0000 [IU] | Freq: Once | INTRAVENOUS | Status: AC | PRN
  Administered 2018-03-05: 500 [IU]
  Filled 2018-03-05: qty 5

## 2018-03-05 MED ORDER — ACETAMINOPHEN 325 MG PO TABS
650.0000 mg | ORAL_TABLET | Freq: Once | ORAL | Status: AC
  Administered 2018-03-05: 650 mg via ORAL
  Filled 2018-03-05: qty 2

## 2018-03-05 NOTE — ED Provider Notes (Signed)
[facility] DEPT Provider Note   CSN: [id number] Arrival date & time: 03/05/18  1014     History   Chief Complaint Chief Complaint  Patient presents with  . URI    HPI Olivia Patel is a 57 y.o. female with a hx of anemia, HTN, s/p tubal ligation who is currently being treated for stage 4 breast cancer with chemotherapy and presents with URI sxs x 1 week, chills last night. Patient reports congestion, rhinorrhea with dry cough which was present for 1 week, seemed to have been getting better then returned last night with development of scratching throat sensation and chills. Relays she checked her temperature at it was 99.2 She states that earlier in the week ibuprofen helped, sxs has not taken anything in past 24 hours. No other specific alleviating/aggravating factors. Denies chest pain, dyspnea, nausea, vomiting, diarrhea, abdominal pain, or dysuria. + recent sick contacts with daughter and grandchildren.   She is currently receiving Taxol/carboplatin every 3 weeks- last treatment was 03/03/18 , referral for possible mastectomy placed, primary oncologist is Dr. Learta Codding.   HPI  Past Medical History:  Diagnosis Date  . Anemia   . Arthritis   . Hypertension     Patient Active Problem List   Diagnosis Date Noted  . Port-A-Cath in place 10/28/2017  . Breast cancer ([place]) 09/27/2017  . Goals of care, counseling/discussion 09/27/2017  . Anemia 09/17/2017  . Hypercalcemia 09/17/2017  . AKI (acute kidney injury) ([place]) 09/17/2017  . Hypokalemia 09/17/2017  . Rotator cuff tendinitis 08/11/2016  . Essential hypertension 09/17/2015  . Arthritis 10/09/2012    Past Surgical History:  Procedure Laterality Date  . IR IMAGING GUIDED PORT INSERTION  10/25/2017  . TUBAL LIGATION       OB History    Gravida  6   Para  6   Term  6   Preterm      AB      Living  6     SAB      TAB      Ectopic      Multiple      Live Births  6              Home Medications    Prior to Admission medications   Medication Sig Start Date End Date Taking? Authorizing Provider  fluticasone (FLONASE) 50 MCG/ACT nasal spray Place 2 sprays into both nostrils daily. 09/26/17   Bonnielee Haff, MD  labetalol (NORMODYNE) 300 MG tablet Take 1 tablet (300 mg total) by mouth 2 (two) times daily. 02/17/18   Owens Shark, NP  lidocaine-prilocaine (EMLA) cream Apply to portacath site 1 hour prior to use 10/28/17   Owens Shark, NP  Multiple Vitamin (MULTIVITAMIN WITH MINERALS) TABS tablet Take 1 tablet by mouth daily. Patient not taking: Reported on 03/03/2018 09/26/17   Bonnielee Haff, MD  prochlorperazine (COMPAZINE) 10 MG tablet Take 1 tablet (10 mg total) by mouth every 6 (six) hours as needed for nausea or vomiting. 09/30/17   Tanner, Lyndon Code., PA-C  senna-docusate (SENOKOT-S) 8.6-50 MG tablet Take 1 tablet by mouth at bedtime as needed for mild constipation. 09/26/17   Bonnielee Haff, MD    Family History Family History  Problem Relation Age of Onset  . Hypertension Mother   . Diabetes Mother   . Diabetes Sister   . Hypertension Father   . Kidney failure Father     Social History Social History   Tobacco Use  .  Smoking status: Never Smoker  . Smokeless tobacco: Never Used  Substance Use Topics  . Alcohol use: No    Alcohol/week: 0.0 standard drinks  . Drug use: No     Allergies   Patient has no known allergies.   Review of Systems Review of Systems  Constitutional: Positive for chills.  HENT: Positive for congestion and sore throat. Negative for ear pain, trouble swallowing and voice change.   Respiratory: Positive for cough. Negative for shortness of breath.   Cardiovascular: Negative for chest pain.  Gastrointestinal: Negative for abdominal pain, diarrhea, nausea and vomiting.  Genitourinary: Negative for dysuria.  All other systems reviewed and are negative.  Physical Exam Updated Vital Signs BP (!) 131/91 (BP Location:  Right Arm)   Pulse (!) 118   Temp 99.7 F (37.6 C) (Oral)   Resp 20   Ht _0  (1.6 m)   Wt 74.7 kg   LMP 05/29/2015 Comment: ligation  SpO2 100%   BMI 29.18 kg/m   Physical Exam  Constitutional: She appears well-developed and well-nourished.  Non-toxic appearance. No distress.  HENT:  Head: Normocephalic and atraumatic.  Right Ear: Tympanic membrane is not perforated, not erythematous, not retracted and not bulging.  Left Ear: Tympanic membrane is not perforated, not erythematous, not retracted and not bulging.  Nose: Mucosal edema present.  Mouth/Throat: Uvula is midline and oropharynx is clear and moist. No oropharyngeal exudate or posterior oropharyngeal erythema. No tonsillar exudate.  Posterior oropharynx is symmetric appearing. Patient tolerating own secretions without difficulty. No trismus. No drooling. No hot potato voice. No swelling beneath the tongue, submandibular compartment is soft.   Eyes: Pupils are equal, round, and reactive to light. Conjunctivae are normal. Right eye exhibits no discharge. Left eye exhibits no discharge.  Neck: Normal range of motion. Neck supple. No neck rigidity. No edema and no erythema present.  Cardiovascular: Regular rhythm. Tachycardia present.  No murmur heard. Pulmonary/Chest: Effort normal and breath sounds normal. No respiratory distress. She has no wheezes. She has no rhonchi. She has no rales.  Respiration even and unlabored  Abdominal: Soft. She exhibits no distension. There is no tenderness.  Lymphadenopathy:    She has no cervical adenopathy.  Neurological: She is alert.  Clear speech.   Skin: Skin is warm and dry. No rash noted.  Psychiatric: She has a normal mood and affect. Her behavior is normal.  Nursing note and vitals reviewed.  ED Treatments / Results  Labs (all labs ordered are listed, but only abnormal results are displayed) Labs Reviewed  CBC WITH DIFFERENTIAL/PLATELET - Abnormal; Notable for the following  components:      Result Value   RBC 2.51 (*)    Hemoglobin 8.0 (*)    HCT 25.3 (*)    MCV 100.8 (*)    RDW 17.9 (*)    All other components within normal limits  COMPREHENSIVE METABOLIC PANEL - Abnormal; Notable for the following components:   Potassium 3.4 (*)    Calcium 8.7 (*)    Alkaline Phosphatase 130 (*)    All other components within normal limits  URINALYSIS, ROUTINE W REFLEX MICROSCOPIC - Abnormal; Notable for the following components:   Ketones, ur 5 (*)    Leukocytes, UA TRACE (*)    All other components within normal limits  INFLUENZA PANEL BY PCR (TYPE A & B)    EKG None  Radiology Dg Chest 2 View  Result Date: 03/05/2018 CLINICAL DATA:  57 year old female with history of stuffy nose,  fever, sore throat and nonproductive cough. EXAM: CHEST - 2 VIEW COMPARISON:  Chest x-ray 09/24/2017. FINDINGS: Lung volumes are normal. No consolidative airspace disease. No pleural effusions. No pneumothorax. No pulmonary nodule or mass noted. Pulmonary vasculature and the cardiomediastinal silhouette are within normal limits. Multiple old healed bilateral rib fractures. Left internal jugular single-lumen power porta cath with tip terminating at the superior cavoatrial junction. IMPRESSION: 1. No radiographic evidence of acute cardiopulmonary disease. Electronically Signed   By: Vinnie Langton M.D.   On: 03/05/2018 12:32    Procedures Procedures (including critical care time)  Medications Ordered in ED Medications - No data to display   Initial Impression / Assessment and Plan / ED Course  I have reviewed the triage vital signs and the nursing notes.  Pertinent labs & imaging results that were available during my care of the patient were reviewed by me and considered in my medical decision making (see chart for details).    Patient presents to the ED with URI sxs and chills, of note patient currently receiving chemotherapy for breast cancer- last infusion 11/8. Patient  nontoxic appearing, noted to have an oral temperature of 99.7 and tachycardic, likely febrile. Exam is fairly benign, she does not appear toxic, will initiate work up with basic labs, CXR, and influenza swab. Tylenol for suspected pyrexia, gentle fluids.   Work- up in the ER has been overall re-assuring. She has no leukocytosis or elevation in neutrophils. Her hgb of 8.0 is at her baseline range. No significant electrolyte disturbance- mild hypokalemia at 3.4 and hypocalcemia at 8.7. Renal function and LFTs are WNL, mild elevation in alk phos- non surgical abdomen- do not suspect abdominal cause of fever. Urinalysis without infection. Flu swab negative, CXR without infiltrate, Centor score 0- doubt strep.   Patient without clear bacterial infection. She is overall well appearing, nontoxic, and her vitals have normalized. Suspect viral illness. All findings and plan of care discussed with supervising physician Dr. Tomi Bamberger- in agreement with evaluation and plan for discharge home with strict return precautions and close follow up. I discussed results, treatment plan, need for follow-up, and return precautions with the patient as well as her children at bedside. Provided opportunity for questions, patient and her family confirmed understanding and are in agreement with plan.   Vitals:   03/05/18 1330 03/05/18 1430  BP: 138/90 135/88  Pulse: 98 95  Resp: 17 18  Temp:    SpO2: 99% 100%    Final Clinical Impressions(s) / ED Diagnoses   Final diagnoses:  URI with cough and congestion    ED Discharge Orders    None       Amaryllis Dyke, PA-C 03/05/18 1526    Dorie Rank, MD 03/08/18 1105

## 2018-03-05 NOTE — ED Triage Notes (Signed)
Pt having cold like symptoms- stuffy nose, fever, sore throat. Fever at home was 99.2. Pt states she has been having these symptoms for a week, but let up some. Fever started last night. Pt being treated with chemo for stage 4 breast cancer .

## 2018-03-05 NOTE — Discharge Instructions (Addendum)
You were seen in the emergency department today for congestion, cough, chills, and sore throat.  Your work-up in the emergency department has been overall reassuring.  Your labs look similar to previous labs you have had done without substantial abnormalities.  Your hemoglobin appears to be consistent with prior.  Your flu swabs were negative.  Your chest x-ray was normal.  Your urine did not show infection.  At this time we suspect that your symptoms are related to a viral illness, we would like you to monitor your symptoms closely at home, follow-up with your primary care provider within 2 days, return to the ER should you spike fevers, feel poorly, or have any other concerns .

## 2018-03-05 NOTE — ED Notes (Signed)
Bed: WA03 Expected date:  Expected time:  Means of arrival:  Comments: 

## 2018-03-12 ENCOUNTER — Other Ambulatory Visit: Payer: Self-pay | Admitting: Oncology

## 2018-03-13 ENCOUNTER — Telehealth: Payer: Self-pay | Admitting: *Deleted

## 2018-03-13 ENCOUNTER — Inpatient Hospital Stay (HOSPITAL_COMMUNITY)
Admission: EM | Admit: 2018-03-13 | Discharge: 2018-03-18 | DRG: 153 | Disposition: A | Discharge status: Home / Self Care | Payer: Medicare Other | Attending: Internal Medicine | Admitting: Internal Medicine

## 2018-03-13 ENCOUNTER — Emergency Department (HOSPITAL_COMMUNITY): Payer: Medicare Other

## 2018-03-13 ENCOUNTER — Encounter (HOSPITAL_COMMUNITY): Payer: Self-pay | Admitting: Emergency Medicine

## 2018-03-13 ENCOUNTER — Other Ambulatory Visit: Payer: Self-pay

## 2018-03-13 DIAGNOSIS — C50811 Malignant neoplasm of overlapping sites of right female breast: Secondary | ICD-10-CM | POA: Diagnosis not present

## 2018-03-13 DIAGNOSIS — E871 Hypo-osmolality and hyponatremia: Secondary | ICD-10-CM | POA: Diagnosis not present

## 2018-03-13 DIAGNOSIS — R509 Fever, unspecified: Secondary | ICD-10-CM | POA: Diagnosis not present

## 2018-03-13 DIAGNOSIS — B9689 Other specified bacterial agents as the cause of diseases classified elsewhere: Secondary | ICD-10-CM | POA: Diagnosis present

## 2018-03-13 DIAGNOSIS — Z171 Estrogen receptor negative status [ER-]: Secondary | ICD-10-CM | POA: Diagnosis not present

## 2018-03-13 DIAGNOSIS — Z8249 Family history of ischemic heart disease and other diseases of the circulatory system: Secondary | ICD-10-CM

## 2018-03-13 DIAGNOSIS — C50911 Malignant neoplasm of unspecified site of right female breast: Secondary | ICD-10-CM

## 2018-03-13 DIAGNOSIS — J019 Acute sinusitis, unspecified: Secondary | ICD-10-CM | POA: Diagnosis not present

## 2018-03-13 DIAGNOSIS — C7951 Secondary malignant neoplasm of bone: Secondary | ICD-10-CM | POA: Diagnosis present

## 2018-03-13 DIAGNOSIS — D6481 Anemia due to antineoplastic chemotherapy: Secondary | ICD-10-CM | POA: Diagnosis not present

## 2018-03-13 DIAGNOSIS — Z79899 Other long term (current) drug therapy: Secondary | ICD-10-CM

## 2018-03-13 DIAGNOSIS — Z7951 Long term (current) use of inhaled steroids: Secondary | ICD-10-CM

## 2018-03-13 DIAGNOSIS — R6510 Systemic inflammatory response syndrome (SIRS) of non-infectious origin without acute organ dysfunction: Secondary | ICD-10-CM | POA: Diagnosis present

## 2018-03-13 DIAGNOSIS — R05 Cough: Secondary | ICD-10-CM | POA: Diagnosis not present

## 2018-03-13 DIAGNOSIS — M1990 Unspecified osteoarthritis, unspecified site: Secondary | ICD-10-CM | POA: Diagnosis present

## 2018-03-13 DIAGNOSIS — E876 Hypokalemia: Secondary | ICD-10-CM | POA: Diagnosis present

## 2018-03-13 DIAGNOSIS — E861 Hypovolemia: Secondary | ICD-10-CM | POA: Diagnosis not present

## 2018-03-13 DIAGNOSIS — R000 Tachycardia, unspecified: Secondary | ICD-10-CM | POA: Diagnosis not present

## 2018-03-13 DIAGNOSIS — C50919 Malignant neoplasm of unspecified site of unspecified female breast: Secondary | ICD-10-CM | POA: Diagnosis present

## 2018-03-13 DIAGNOSIS — I10 Essential (primary) hypertension: Secondary | ICD-10-CM | POA: Diagnosis present

## 2018-03-13 DIAGNOSIS — T451X5A Adverse effect of antineoplastic and immunosuppressive drugs, initial encounter: Secondary | ICD-10-CM | POA: Diagnosis present

## 2018-03-13 DIAGNOSIS — D649 Anemia, unspecified: Secondary | ICD-10-CM | POA: Diagnosis not present

## 2018-03-13 DIAGNOSIS — Z853 Personal history of malignant neoplasm of breast: Secondary | ICD-10-CM

## 2018-03-13 LAB — COMPREHENSIVE METABOLIC PANEL
ALBUMIN: 4 g/dL (ref 3.5–5.0)
ALK PHOS: 162 U/L — AB (ref 38–126)
ALT: 33 U/L (ref 0–44)
AST: 39 U/L (ref 15–41)
Anion gap: 13 (ref 5–15)
BILIRUBIN TOTAL: 0.9 mg/dL (ref 0.3–1.2)
BUN: 17 mg/dL (ref 6–20)
CALCIUM: 9.3 mg/dL (ref 8.9–10.3)
CO2: 22 mmol/L (ref 22–32)
Chloride: 97 mmol/L — ABNORMAL LOW (ref 98–111)
Creatinine, Ser: 0.83 mg/dL (ref 0.44–1.00)
GLUCOSE: 124 mg/dL — AB (ref 70–99)
Potassium: 3.5 mmol/L (ref 3.5–5.1)
Sodium: 132 mmol/L — ABNORMAL LOW (ref 135–145)
TOTAL PROTEIN: 8.1 g/dL (ref 6.5–8.1)

## 2018-03-13 LAB — CBC WITH DIFFERENTIAL/PLATELET
Abs Immature Granulocytes: 0.06 10*3/uL (ref 0.00–0.07)
BASOS ABS: 0 10*3/uL (ref 0.0–0.1)
Basophils Relative: 0 %
EOS ABS: 0 10*3/uL (ref 0.0–0.5)
EOS PCT: 0 %
HEMATOCRIT: 26 % — AB (ref 36.0–46.0)
Hemoglobin: 8.3 g/dL — ABNORMAL LOW (ref 12.0–15.0)
Immature Granulocytes: 1 %
LYMPHS ABS: 1.1 10*3/uL (ref 0.7–4.0)
Lymphocytes Relative: 14 %
MCH: 31.2 pg (ref 26.0–34.0)
MCHC: 31.9 g/dL (ref 30.0–36.0)
MCV: 97.7 fL (ref 80.0–100.0)
Monocytes Absolute: 1.1 10*3/uL — ABNORMAL HIGH (ref 0.1–1.0)
Monocytes Relative: 15 %
Neutro Abs: 5.3 10*3/uL (ref 1.7–7.7)
Neutrophils Relative %: 70 %
Platelets: 251 10*3/uL (ref 150–400)
RBC: 2.66 MIL/uL — ABNORMAL LOW (ref 3.87–5.11)
RDW: 18.1 % — AB (ref 11.5–15.5)
WBC: 7.5 10*3/uL (ref 4.0–10.5)
nRBC: 0 % (ref 0.0–0.2)

## 2018-03-13 LAB — INFLUENZA PANEL BY PCR (TYPE A & B)
Influenza A By PCR: NEGATIVE
Influenza B By PCR: NEGATIVE

## 2018-03-13 LAB — I-STAT CG4 LACTIC ACID, ED: Lactic Acid, Venous: 1.13 mmol/L (ref 0.5–1.9)

## 2018-03-13 LAB — PROTIME-INR
INR: 1.3
Prothrombin Time: 16.1 seconds — ABNORMAL HIGH (ref 11.4–15.2)

## 2018-03-13 MED ORDER — ENOXAPARIN SODIUM 40 MG/0.4ML ~~LOC~~ SOLN
40.0000 mg | SUBCUTANEOUS | Status: DC
  Administered 2018-03-13 – 2018-03-15 (×3): 40 mg via SUBCUTANEOUS
  Filled 2018-03-13 (×4): qty 0.4

## 2018-03-13 MED ORDER — SODIUM CHLORIDE 0.9 % IV SOLN
INTRAVENOUS | Status: DC
  Administered 2018-03-13 – 2018-03-14 (×2): via INTRAVENOUS

## 2018-03-13 MED ORDER — IBUPROFEN 200 MG PO TABS
400.0000 mg | ORAL_TABLET | Freq: Four times a day (QID) | ORAL | Status: DC | PRN
  Administered 2018-03-15: 400 mg via ORAL
  Filled 2018-03-13: qty 2

## 2018-03-13 MED ORDER — ACETAMINOPHEN 650 MG RE SUPP: 650.0000 mg | Freq: Four times a day (QID) | RECTAL | Status: DC | PRN

## 2018-03-13 MED ORDER — SODIUM CHLORIDE 0.9 % IV BOLUS
1000.0000 mL | Freq: Once | INTRAVENOUS | Status: AC
  Administered 2018-03-13: 1000 mL via INTRAVENOUS

## 2018-03-13 MED ORDER — LABETALOL HCL 300 MG PO TABS
300.0000 mg | ORAL_TABLET | Freq: Two times a day (BID) | ORAL | Status: DC
  Administered 2018-03-13 – 2018-03-18 (×10): 300 mg via ORAL
  Filled 2018-03-13 (×11): qty 1

## 2018-03-13 MED ORDER — SENNOSIDES-DOCUSATE SODIUM 8.6-50 MG PO TABS
1.0000 | ORAL_TABLET | Freq: Every evening | ORAL | Status: DC | PRN
  Administered 2018-03-15: 1 via ORAL
  Filled 2018-03-13: qty 1

## 2018-03-13 MED ORDER — MENTHOL 3 MG MT LOZG
1.0000 | LOZENGE | OROMUCOSAL | Status: DC | PRN
  Filled 2018-03-13: qty 9

## 2018-03-13 MED ORDER — AMOXICILLIN-POT CLAVULANATE 875-125 MG PO TABS
1.0000 | ORAL_TABLET | Freq: Once | ORAL | Status: AC
  Administered 2018-03-13: 1 via ORAL
  Filled 2018-03-13: qty 1

## 2018-03-13 MED ORDER — ONDANSETRON HCL 4 MG PO TABS: 4.0000 mg | ORAL_TABLET | Freq: Four times a day (QID) | ORAL | Status: DC | PRN

## 2018-03-13 MED ORDER — AMOXICILLIN-POT CLAVULANATE 875-125 MG PO TABS
1.0000 | ORAL_TABLET | Freq: Two times a day (BID) | ORAL | Status: DC
  Administered 2018-03-14 (×2): 1 via ORAL
  Filled 2018-03-13 (×2): qty 1

## 2018-03-13 MED ORDER — ACETAMINOPHEN 325 MG PO TABS
650.0000 mg | ORAL_TABLET | Freq: Four times a day (QID) | ORAL | Status: DC | PRN
  Administered 2018-03-14 – 2018-03-17 (×7): 650 mg via ORAL
  Filled 2018-03-13 (×7): qty 2

## 2018-03-13 MED ORDER — ACETAMINOPHEN 325 MG PO TABS
650.0000 mg | ORAL_TABLET | Freq: Once | ORAL | Status: AC | PRN
  Administered 2018-03-13: 650 mg via ORAL
  Filled 2018-03-13: qty 2

## 2018-03-13 MED ORDER — ONDANSETRON HCL 4 MG/2ML IJ SOLN
4.0000 mg | Freq: Four times a day (QID) | INTRAMUSCULAR | Status: DC | PRN
  Administered 2018-03-17 (×2): 4 mg via INTRAVENOUS
  Filled 2018-03-13 (×2): qty 2

## 2018-03-13 NOTE — ED Notes (Signed)
Bed: HT09 Expected date:  Expected time:  Means of arrival:  Comments: T4

## 2018-03-13 NOTE — ED Triage Notes (Addendum)
Patient reports fever, headache, and congestion since Thursday. Seen on 11/10 for same sx and dx with virus. Denies cough, N/V/D. Febrile in triage. Last dose tylenol at 0800 today, dayquil at 1100. Hx breast cancer. Last chemo 11/8.

## 2018-03-13 NOTE — Telephone Encounter (Signed)
Returned call to [name] who left message "I have a fever ".  Completed Cycle 6 Carboplatin/Taxol on 03-03-2018.  "I went to ED on 03-05-2018 with cough, congestion, fever.  Urine, Flu test were negative, chest xray normal but I have a fever this morning of 100.0 to 100.1 F.  Took Equate brand Tylenol for headache an hour before I checked temperature.  Per ED I have a viral respiratory infection.  Using DayQuil and Ricola cough drops, drinking 64 oz water daily and Flonase two drops each nostril daily.  They mentioned Mucinex but I don't have this.  Continuing with a stuffy nose, scratchy throat and headache that comes and goes.  Now today I have a fever.  My daughter can bring me in when she gets off work at 3:30 pm today.  Everyone is at work.  I can arrange transportation for tomorrow morning."  Advised to report to ED for temperature 100.5 or greater with chills. Scheduling message sent.  Instructed to continue treating symptoms.  Mucinex decongestant requires drinking eight ounces water with pills and drink water throughout the day to work best.

## 2018-03-13 NOTE — ED Notes (Signed)
Patient transported to X-ray 

## 2018-03-13 NOTE — ED Provider Notes (Signed)
[facility] DEPT Provider Note   CSN: [id number] Arrival date & time: 03/13/18  1724     History   Chief Complaint Chief Complaint  Patient presents with  . Fever    HPI Cherrish Vitali is a 57 y.o. female.  The history is provided by the patient and medical records. No language interpreter was used.  Fever     Zareth Rippetoe is a 57 y.o. female who presents to the Emergency Department complaining of fever. She presents to the emergency department complaining of fever. She is currently undergoing chemotherapy for breast cancer. Her last chemotherapy was on November 8. On November 10 that she was seen in the emergency department for temperature to 99.7 with headache, nasal congestion, scratchy throat and cough. She was discharged home with diagnosis of viral illness. She presents today for temperature to 100.7 at home and ongoing symptoms. Cough is nonproductive. She denies any shortness of breath, chest pain, abdominal pain, nausea, vomiting, dysuria, skin rash. She has a follow-up appointment on November 22 with oncology. Past Medical History:  Diagnosis Date  . Anemia   . Arthritis   . Hypertension     Patient Active Problem List   Diagnosis Date Noted  . SIRS (systemic inflammatory response syndrome) ([place]) 03/13/2018  . Hyponatremia 03/13/2018  . Acute bacterial sinusitis 03/13/2018  . Port-A-Cath in place 10/28/2017  . Breast cancer ([place]) 09/27/2017  . Goals of care, counseling/discussion 09/27/2017  . Anemia 09/17/2017  . Hypercalcemia 09/17/2017  . Hypokalemia 09/17/2017  . Rotator cuff tendinitis 08/11/2016  . Essential hypertension 09/17/2015  . Arthritis 10/09/2012    Past Surgical History:  Procedure Laterality Date  . IR IMAGING GUIDED PORT INSERTION  10/25/2017  . TUBAL LIGATION       OB History    Gravida  6   Para  6   Term  6   Preterm      AB      Living  6     SAB      TAB      Ectopic      Multiple      Live Births  6            Home Medications    Prior to Admission medications   Medication Sig Start Date End Date Taking? Authorizing Provider  fluticasone (FLONASE) 50 MCG/ACT nasal spray Place 2 sprays into both nostrils daily. 09/26/17  Yes Bonnielee Haff, MD  ibuprofen (ADVIL,MOTRIN) 200 MG tablet Take 400 mg by mouth every 6 (six) hours as needed.   Yes [provider]  labetalol (NORMODYNE) 300 MG tablet Take 1 tablet (300 mg total) by mouth 2 (two) times daily. 02/17/18  Yes Owens Shark, NP  Menthol (COUGH DROPS) 10 MG LOZG Use as directed 1 lozenge in the mouth or throat every 2 (two) hours as needed (cough).   Yes [provider]  Menthol, Topical Analgesic, (ICY HOT BACK EX) Apply 1 application topically daily as needed (arthritis pain).   Yes [provider]  prochlorperazine (COMPAZINE) 10 MG tablet Take 1 tablet (10 mg total) by mouth every 6 (six) hours as needed for nausea or vomiting. 09/30/17  Yes Tanner, Lyndon Code., PA-C  Pseudoephedrine-APAP-DM (DAYQUIL PO) Take 2 tablets by mouth daily.   Yes [provider]  senna-docusate (SENOKOT-S) 8.6-50 MG tablet Take 1 tablet by mouth at bedtime as needed for mild constipation. 09/26/17  Yes Bonnielee Haff, MD  lidocaine-prilocaine (EMLA) cream Apply  to portacath site 1 hour prior to use 10/28/17   Owens Shark, NP    Family History Family History  Problem Relation Age of Onset  . Hypertension Mother   . Diabetes Mother   . Diabetes Sister   . Hypertension Father   . Kidney failure Father     Social History Social History   Tobacco Use  . Smoking status: Never Smoker  . Smokeless tobacco: Never Used  Substance Use Topics  . Alcohol use: No    Alcohol/week: 0.0 standard drinks  . Drug use: No     Allergies   Patient has no known allergies.   Review of Systems Review of Systems  Constitutional: Positive for fever.  All other systems reviewed and are  negative.    Physical Exam Updated Vital Signs BP (!) 151/85 (BP Location: Right Arm)   Pulse (!) 115   Temp 99.5 F (37.5 C) (Oral)   Resp (!) 32   Ht 5\' 3"  (1.6 m)   Wt 74.4 kg   LMP 05/29/2015 Comment: ligation  SpO2 100%   BMI 29.05 kg/m   Physical Exam  Constitutional: She is oriented to person, place, and time. She appears well-developed and well-nourished.  HENT:  Head: Normocephalic and atraumatic.  No significant erythema or edema in the posterior oropharynx  Cardiovascular: Regular rhythm.  No murmur heard. Tachycardic  Pulmonary/Chest: Effort normal and breath sounds normal. No respiratory distress.  port in the left upper chest wall.  Abdominal: Soft. There is no tenderness. There is no rebound and no guarding.  Musculoskeletal: She exhibits no edema or tenderness.  Neurological: She is alert and oriented to person, place, and time.  Skin: Skin is warm and dry.  Psychiatric: She has a normal mood and affect. Her behavior is normal.  Nursing note and vitals reviewed.    ED Treatments / Results  Labs (all labs ordered are listed, but only abnormal results are displayed) Labs Reviewed  COMPREHENSIVE METABOLIC PANEL - Abnormal; Notable for the following components:      Result Value   Sodium 132 (*)    Chloride 97 (*)    Glucose, Bld 124 (*)    Alkaline Phosphatase 162 (*)    All other components within normal limits  CBC WITH DIFFERENTIAL/PLATELET - Abnormal; Notable for the following components:   RBC 2.66 (*)    Hemoglobin 8.3 (*)    HCT 26.0 (*)    RDW 18.1 (*)    Monocytes Absolute 1.1 (*)    All other components within normal limits  PROTIME-INR - Abnormal; Notable for the following components:   Prothrombin Time 16.1 (*)    All other components within normal limits  CULTURE, BLOOD (ROUTINE X 2)  CULTURE, BLOOD (ROUTINE X 2)  URINE CULTURE  INFLUENZA PANEL BY PCR (TYPE A & B)  URINALYSIS, ROUTINE W REFLEX MICROSCOPIC  BASIC METABOLIC PANEL   I-STAT CG4 LACTIC ACID, ED    EKG None  Radiology Dg Chest 2 View  Result Date: 03/13/2018 CLINICAL DATA:  Cough and fever EXAM: CHEST - 2 VIEW COMPARISON:  CT chest 12/22/2017 and CXR 03/05/2018 FINDINGS: Bilateral healing rib fractures are noted accounting for areas of subtle opacity the left lower lobe and right mid lung. No overt pulmonary edema, effusion or pneumothorax. Heart size and mediastinal contours are stable and within normal limits. Port catheter tip terminates at the cavoatrial junction without complicating features IMPRESSION: Healing bilateral rib fractures are believed to account for subtle opacities  projecting over the right mid lung and left lung base. No active pulmonary disease. Electronically Signed   By: Ashley Royalty M.D.   On: 03/13/2018 18:13    Procedures Procedures (including critical care time)  Medications Ordered in ED Medications  ibuprofen (ADVIL,MOTRIN) tablet 400 mg (has no administration in time range)  labetalol (NORMODYNE) tablet 300 mg (300 mg Oral Given 03/13/18 2300)  menthol-cetylpyridinium (CEPACOL) lozenge 3 mg (has no administration in time range)  enoxaparin (LOVENOX) injection 40 mg (40 mg Subcutaneous Given 03/13/18 2300)  0.9 %  sodium chloride infusion ( Intravenous New Bag/Given 03/13/18 2252)  acetaminophen (TYLENOL) tablet 650 mg (has no administration in time range)    Or  acetaminophen (TYLENOL) suppository 650 mg (has no administration in time range)  senna-docusate (Senokot-S) tablet 1 tablet (has no administration in time range)  ondansetron (ZOFRAN) tablet 4 mg (has no administration in time range)    Or  ondansetron (ZOFRAN) injection 4 mg (has no administration in time range)  amoxicillin-clavulanate (AUGMENTIN) 875-125 MG per tablet 1 tablet (has no administration in time range)  acetaminophen (TYLENOL) tablet 650 mg (650 mg Oral Given 03/13/18 1802)  sodium chloride 0.9 % bolus 1,000 mL ( Intravenous Rate/Dose Verify  03/13/18 2200)  amoxicillin-clavulanate (AUGMENTIN) 875-125 MG per tablet 1 tablet (1 tablet Oral Given 03/13/18 2112)  sodium chloride 0.9 % bolus 1,000 mL (0 mLs Intravenous Stopped 03/13/18 2236)     Initial Impression / Assessment and Plan / ED Course  I have reviewed the triage vital signs and the nursing notes.  Pertinent labs & imaging results that were available during my care of the patient were reviewed by me and considered in my medical decision making (see chart for details).     Patient with history of breast cancer on chemotherapy here for evaluation of fever, nasal congestion, sore throat, headache. She is non-toxic appearing on examination. She does have persistent tachycardia even after resolution of fever. Labs are reassuring with stable anemia. No evidence of pneumonia, UTI. Presentation is not consistent with meningitis. Given Augmentin for possible sinusitis versus viral infection. Medicine consulted for observation given patient's persistent tachycardia.  Final Clinical Impressions(s) / ED Diagnoses   Final diagnoses:  None    ED Discharge Orders    None       Quintella Reichert, MD 03/14/18 0007

## 2018-03-13 NOTE — ED Notes (Signed)
Pt placed on droplet precautions

## 2018-03-13 NOTE — ED Notes (Signed)
Transport called to take patient upstairs 

## 2018-03-13 NOTE — H&P (Signed)
History and Physical    Natalin Bible OYD:[id number] DOB: Nov 17, 1960 DOA: 03/13/2018  PCP: Lance Sell, NP   Patient coming from: Home   Chief Complaint: Fever/chills, malaise, congestion   HPI: Olivia Patel is a 57 y.o. female with medical history significant for hypertension, anemia, and cancer of the right breast with osseous metastases, now presenting to the emergency department for evaluation of fevers, chills, and malaise.  Patient reports that symptoms developed approximately 10 days ago, been intermittent, but she has had a higher and more persistent fever for the past day.  She reports some sinus congestion and pain over the right eye, nonproductive cough, but denies any significant shortness of breath.  She denies abdominal pain, nausea, vomiting, or diarrhea.  She denies dysuria.  Denies any pain or drainage at her port site, denies any wounds or rash, and denies any neck stiffness or headache.  ED Course: Upon arrival to the ED, patient is found to be febrile to 38.7 C, tachycardic to 130s, and with stable blood pressure.  Chest x-ray is negative for acute cardiopulmonary disease.  Chemistry panel is notable for a mild hyponatremia.  CBC features a stable normocytic anemia.  Influenza PCR is negative.  Lactic acid is reassuringly normal.  Blood cultures were collected, liter of normal saline was administered, and the patient was treated with Augmentin in the ED.  Blood pressure remained stable, heart rate remains elevated in the 120s, and patient will be observed for ongoing evaluation and management of fevers in cancer patient with central venous catheter and undergoing chemotherapy.   Review of Systems:  All other systems reviewed and apart from HPI, are negative.  Past Medical History:  Diagnosis Date  . Anemia   . Arthritis   . Hypertension     Past Surgical History:  Procedure Laterality Date  . IR IMAGING GUIDED PORT INSERTION  10/25/2017  . TUBAL LIGATION        reports that she has never smoked. She has never used smokeless tobacco. She reports that she does not drink alcohol or use drugs.  No Known Allergies  Family History  Problem Relation Age of Onset  . Hypertension Mother   . Diabetes Mother   . Diabetes Sister   . Hypertension Father   . Kidney failure Father      Prior to Admission medications   Medication Sig Start Date End Date Taking? Authorizing Provider  fluticasone (FLONASE) 50 MCG/ACT nasal spray Place 2 sprays into both nostrils daily. 09/26/17  Yes Bonnielee Haff, MD  ibuprofen (ADVIL,MOTRIN) 200 MG tablet Take 400 mg by mouth every 6 (six) hours as needed.   Yes [provider]  labetalol (NORMODYNE) 300 MG tablet Take 1 tablet (300 mg total) by mouth 2 (two) times daily. 02/17/18  Yes Owens Shark, NP  Menthol (COUGH DROPS) 10 MG LOZG Use as directed 1 lozenge in the mouth or throat every 2 (two) hours as needed (cough).   Yes [provider]  Menthol, Topical Analgesic, (ICY HOT BACK EX) Apply 1 application topically daily as needed (arthritis pain).   Yes [provider]  prochlorperazine (COMPAZINE) 10 MG tablet Take 1 tablet (10 mg total) by mouth every 6 (six) hours as needed for nausea or vomiting. 09/30/17  Yes Tanner, Lyndon Code., PA-C  Pseudoephedrine-APAP-DM (DAYQUIL PO) Take 2 tablets by mouth daily.   Yes [provider]  senna-docusate (SENOKOT-S) 8.6-50 MG tablet Take 1 tablet by mouth at bedtime as needed for mild  constipation. 09/26/17  Yes Bonnielee Haff, MD  lidocaine-prilocaine (EMLA) cream Apply to portacath site 1 hour prior to use 10/28/17   Owens Shark, NP    Physical Exam: Vitals:   03/13/18 1900 03/13/18 1930 03/13/18 2000 03/13/18 2030  BP: 135/90 (!) 148/89 (!) 147/88 (!) 141/91  Pulse: (!) 118 (!) 123 (!) 118 (!) 120  Resp: 18 (!) 29 (!) 24 (!) 23  Temp:      TempSrc:      SpO2: 99% 99% 100% 99%  Weight:      Height:        Constitutional: NAD, calm    Eyes: PERTLA, lids and conjunctivae normal ENMT: Mucous membranes are moist. Posterior pharynx clear of any exudate or lesions.   Neck: normal, supple, no masses, no thyromegaly Respiratory: clear to auscultation bilaterally, no wheezing, no crackles. Mild tachypnea. Normal respiratory effort.   Cardiovascular: Rate ~120 and regular. No extremity edema.  Abdomen: No distension, no tenderness, soft. Bowel sounds normal.  Musculoskeletal: no clubbing / cyanosis. No joint deformity upper and lower extremities.    Skin: no significant rashes, lesions, ulcers. Warm, dry, well-perfused. Neurologic: CN 2-12 grossly intact. Sensation intact. Strength 5/5 in all 4 limbs.  Psychiatric: Alert and oriented x 3. Pleasant, cooperative.     Labs on Admission: I have personally reviewed following labs and imaging studies  CBC: Recent Labs  Lab 03/13/18 1802  WBC 7.5  NEUTROABS 5.3  HGB 8.3*  HCT 26.0*  MCV 97.7  PLT 989   Basic Metabolic Panel: Recent Labs  Lab 03/13/18 1802  NA 132*  K 3.5  CL 97*  CO2 22  GLUCOSE 124*  BUN 17  CREATININE 0.83  CALCIUM 9.3   GFR: Estimated Creatinine Clearance: 74 mL/min (by C-G formula based on SCr of 0.83 mg/dL). Liver Function Tests: Recent Labs  Lab 03/13/18 1802  AST 39  ALT 33  ALKPHOS 162*  BILITOT 0.9  PROT 8.1  ALBUMIN 4.0   No results for input(s): LIPASE, AMYLASE in the last 168 hours. No results for input(s): AMMONIA in the last 168 hours. Coagulation Profile: Recent Labs  Lab 03/13/18 1802  INR 1.30   Cardiac Enzymes: No results for input(s): CKTOTAL, CKMB, CKMBINDEX, TROPONINI in the last 168 hours. BNP (last 3 results) No results for input(s): PROBNP in the last 8760 hours. HbA1C: No results for input(s): HGBA1C in the last 72 hours. CBG: No results for input(s): GLUCAP in the last 168 hours. Lipid Profile: No results for input(s): CHOL, HDL, LDLCALC, TRIG, CHOLHDL, LDLDIRECT in the last 72 hours. Thyroid  Function Tests: No results for input(s): TSH, T4TOTAL, FREET4, T3FREE, THYROIDAB in the last 72 hours. Anemia Panel: No results for input(s): VITAMINB12, FOLATE, FERRITIN, TIBC, IRON, RETICCTPCT in the last 72 hours. Urine analysis:    Component Value Date/Time   COLORURINE YELLOW 03/05/2018 [place] 03/05/2018 1239   LABSPEC 1.018 03/05/2018 1239   PHURINE 5.0 03/05/2018 1239   GLUCOSEU NEGATIVE 03/05/2018 1239   HGBUR NEGATIVE 03/05/2018 [place] 03/05/2018 1239   KETONESUR 5 (A) 03/05/2018 1239   PROTEINUR NEGATIVE 03/05/2018 1239   NITRITE NEGATIVE 03/05/2018 1239   LEUKOCYTESUR TRACE (A) 03/05/2018 1239   Sepsis Labs: @LABRCNTIP (procalcitonin:4,lacticidven:4) )No results found for this or any previous visit (from the past 240 hour(s)).   Radiological Exams on Admission: Dg Chest 2 View  Result Date: 03/13/2018 CLINICAL DATA:  Cough and fever EXAM: CHEST - 2 VIEW COMPARISON:  CT chest 12/22/2017 and CXR 03/05/2018 FINDINGS: Bilateral healing rib fractures are noted accounting for areas of subtle opacity the left lower lobe and right mid lung. No overt pulmonary edema, effusion or pneumothorax. Heart size and mediastinal contours are stable and within normal limits. Port catheter tip terminates at the cavoatrial junction without complicating features IMPRESSION: Healing bilateral rib fractures are believed to account for subtle opacities projecting over the right mid lung and left lung base. No active pulmonary disease. Electronically Signed   By: Ashley Royalty M.D.   On: 03/13/2018 18:13    EKG: Independently reviewed. Sinus tachycardia (rate 118).   Assessment/Plan   1. SIRS  - Presents with intermittent fevers/chills and malaise x10 days, now worse  - CXR is clear, UA pending but no urinary sxs, port looks okay, no meningismus, no wounds, abdominal exam benign  - She has sinus congestion and pain over the right eye suggestive of possible  sinusitis  - Lactate is reassuringly normal and BP has remained stable  - Blood cultures collected in ED and dose of Augmentin given for suspected sinusitis  - Given persistent tachycardia despite fluid bolus and presence of central venous catheter, plan to observe in hospital, follow cultures, continue IVF hydration and antipyretics, and continue Augmentin for suspected acute bacterial sinusitis    2. Breast cancer  - Cancer of right breast with metastases to bone, diagnosed May 2019 and currently undergoing treatment with Taxol and carboplatin, last infusion 03/03/18  - She will continue oncology follow-up    3. Hyponatremia  - Serum sodium is 132 in setting of hypovolemia  - Continue IVF hydration and repeat chem panel in am    4. Hypertension  - BP at goal  - Continue labetalol as tolerated     DVT prophylaxis: Lovenox  Code Status: Full  Family Communication: Daughter updated at bedside Consults called: None Admission status: Observation     Vianne Bulls, MD [facility] Pager [phone]  If 7PM-7AM, please contact night-coverage [email or web address] Password [facility]  03/13/2018, 8:53 PM

## 2018-03-14 ENCOUNTER — Other Ambulatory Visit: Payer: Medicare Other

## 2018-03-14 ENCOUNTER — Encounter: Payer: Medicare Other | Admitting: Medical

## 2018-03-14 DIAGNOSIS — J019 Acute sinusitis, unspecified: Secondary | ICD-10-CM | POA: Diagnosis not present

## 2018-03-14 DIAGNOSIS — D649 Anemia, unspecified: Secondary | ICD-10-CM | POA: Diagnosis not present

## 2018-03-14 DIAGNOSIS — T451X5A Adverse effect of antineoplastic and immunosuppressive drugs, initial encounter: Secondary | ICD-10-CM | POA: Diagnosis present

## 2018-03-14 DIAGNOSIS — C50911 Malignant neoplasm of unspecified site of right female breast: Secondary | ICD-10-CM | POA: Diagnosis present

## 2018-03-14 DIAGNOSIS — M1990 Unspecified osteoarthritis, unspecified site: Secondary | ICD-10-CM | POA: Diagnosis present

## 2018-03-14 DIAGNOSIS — B9689 Other specified bacterial agents as the cause of diseases classified elsewhere: Secondary | ICD-10-CM | POA: Diagnosis present

## 2018-03-14 DIAGNOSIS — Z7951 Long term (current) use of inhaled steroids: Secondary | ICD-10-CM | POA: Diagnosis not present

## 2018-03-14 DIAGNOSIS — Z853 Personal history of malignant neoplasm of breast: Secondary | ICD-10-CM | POA: Diagnosis not present

## 2018-03-14 DIAGNOSIS — C7951 Secondary malignant neoplasm of bone: Secondary | ICD-10-CM | POA: Diagnosis present

## 2018-03-14 DIAGNOSIS — Z8249 Family history of ischemic heart disease and other diseases of the circulatory system: Secondary | ICD-10-CM | POA: Diagnosis not present

## 2018-03-14 DIAGNOSIS — C50811 Malignant neoplasm of overlapping sites of right female breast: Secondary | ICD-10-CM | POA: Diagnosis not present

## 2018-03-14 DIAGNOSIS — E876 Hypokalemia: Secondary | ICD-10-CM | POA: Diagnosis present

## 2018-03-14 DIAGNOSIS — E871 Hypo-osmolality and hyponatremia: Secondary | ICD-10-CM | POA: Diagnosis present

## 2018-03-14 DIAGNOSIS — R6510 Systemic inflammatory response syndrome (SIRS) of non-infectious origin without acute organ dysfunction: Secondary | ICD-10-CM | POA: Diagnosis not present

## 2018-03-14 DIAGNOSIS — I10 Essential (primary) hypertension: Secondary | ICD-10-CM | POA: Diagnosis not present

## 2018-03-14 DIAGNOSIS — E861 Hypovolemia: Secondary | ICD-10-CM | POA: Diagnosis present

## 2018-03-14 DIAGNOSIS — Z79899 Other long term (current) drug therapy: Secondary | ICD-10-CM | POA: Diagnosis not present

## 2018-03-14 DIAGNOSIS — D6481 Anemia due to antineoplastic chemotherapy: Secondary | ICD-10-CM | POA: Diagnosis present

## 2018-03-14 LAB — URINALYSIS, ROUTINE W REFLEX MICROSCOPIC
Bacteria, UA: NONE SEEN
Bilirubin Urine: NEGATIVE
GLUCOSE, UA: NEGATIVE mg/dL
Hgb urine dipstick: NEGATIVE
KETONES UR: 20 mg/dL — AB
Leukocytes, UA: NEGATIVE
Nitrite: NEGATIVE
PROTEIN: 30 mg/dL — AB
SPECIFIC GRAVITY, URINE: 1.015 (ref 1.005–1.030)
pH: 6 (ref 5.0–8.0)

## 2018-03-14 LAB — CBC WITH DIFFERENTIAL/PLATELET
Abs Immature Granulocytes: 0.06 10*3/uL (ref 0.00–0.07)
BASOS ABS: 0 10*3/uL (ref 0.0–0.1)
Basophils Relative: 0 %
Eosinophils Absolute: 0 10*3/uL (ref 0.0–0.5)
Eosinophils Relative: 0 %
HEMATOCRIT: 23.4 % — AB (ref 36.0–46.0)
Hemoglobin: 7.4 g/dL — ABNORMAL LOW (ref 12.0–15.0)
IMMATURE GRANULOCYTES: 1 %
LYMPHS PCT: 20 %
Lymphs Abs: 1.5 10*3/uL (ref 0.7–4.0)
MCH: 31.8 pg (ref 26.0–34.0)
MCHC: 31.6 g/dL (ref 30.0–36.0)
MCV: 100.4 fL — AB (ref 80.0–100.0)
Monocytes Absolute: 0.7 10*3/uL (ref 0.1–1.0)
Monocytes Relative: 10 %
NEUTROS ABS: 5.3 10*3/uL (ref 1.7–7.7)
NEUTROS PCT: 69 %
Platelets: 214 10*3/uL (ref 150–400)
RBC: 2.33 MIL/uL — ABNORMAL LOW (ref 3.87–5.11)
RDW: 18.4 % — ABNORMAL HIGH (ref 11.5–15.5)
WBC: 7.7 10*3/uL (ref 4.0–10.5)
nRBC: 0 % (ref 0.0–0.2)

## 2018-03-14 LAB — BASIC METABOLIC PANEL
Anion gap: 10 (ref 5–15)
BUN: 12 mg/dL (ref 6–20)
CHLORIDE: 102 mmol/L (ref 98–111)
CO2: 22 mmol/L (ref 22–32)
CREATININE: 0.69 mg/dL (ref 0.44–1.00)
Calcium: 8.7 mg/dL — ABNORMAL LOW (ref 8.9–10.3)
GFR calc non Af Amer: >60 mL/min (ref >60)
Glucose, Bld: 98 mg/dL (ref 70–99)
POTASSIUM: 3.4 mmol/L — AB (ref 3.5–5.1)
SODIUM: 134 mmol/L — AB (ref 135–145)

## 2018-03-14 LAB — MAGNESIUM: MAGNESIUM: 1.9 mg/dL (ref 1.7–2.4)

## 2018-03-14 MED ORDER — SODIUM CHLORIDE 0.9% FLUSH
10.0000 mL | INTRAVENOUS | Status: DC | PRN
  Administered 2018-03-14 – 2018-03-18 (×2): 10 mL
  Filled 2018-03-14 (×2): qty 40

## 2018-03-14 MED ORDER — POTASSIUM CHLORIDE CRYS ER 20 MEQ PO TBCR
40.0000 meq | EXTENDED_RELEASE_TABLET | Freq: Once | ORAL | Status: AC
  Administered 2018-03-14: 40 meq via ORAL
  Filled 2018-03-14: qty 2

## 2018-03-14 MED ORDER — SODIUM CHLORIDE 0.9 % IV SOLN
INTRAVENOUS | Status: AC
  Administered 2018-03-14 – 2018-03-15 (×3): via INTRAVENOUS

## 2018-03-14 MED ORDER — LORATADINE 10 MG PO TABS
10.0000 mg | ORAL_TABLET | Freq: Every day | ORAL | Status: DC
  Administered 2018-03-14 – 2018-03-18 (×5): 10 mg via ORAL
  Filled 2018-03-14 (×5): qty 1

## 2018-03-14 NOTE — Progress Notes (Signed)
PROGRESS NOTE    Olivia Patel  EGB:[id number] DOB: 1961-04-09 DOA: 03/13/2018 PCP: Lance Sell, NP    Brief Narrative:  Patient 57 year old female history of hypertension, anemia, right breast cancer with osseous mets presented to the ED with fevers chills malaise, sinus congestion right eye pain with nonproductive cough.  Chest x-ray done negative for any any acute cardiopulmonary disease.  Influenza PCR negative.  Patient admitted placed empirically on IV fluids, Augmentin for presumed sinusitis, antiemetics, antipyretics, supportive care.   Assessment & Plan:   Principal Problem:   SIRS (systemic inflammatory response syndrome) (HCC) Active Problems:   Essential hypertension   Anemia   Breast cancer (HCC)   Hyponatremia   Acute bacterial sinusitis  #1 systemic inflammatory response syndrome Questionable etiology.  Felt may be secondary to an acute sinusitis.  Patient meets criteria for SIRS with fevers, chills and malaise x10 days, noted to be tachycardic on examination.  Patient with a T-max of 101.1 today.  Urinalysis unremarkable.  Chest x-ray unremarkable.  Patient on chemotherapy and immunocompromise and as such we will check blood cultures x2.  Continue empiric Augmentin, IV fluids.  Supportive care.  2.  Hyponatremia Likely secondary to hypovolemia.  IV fluids.  Follow.  3.  Probable acute bacterial sinusitis Continue Augmentin.  4.  Anemia Likely chemotherapy-induced.  Patient with no overt bleeding.  Check an anemia panel.  Follow H&H.  Transfusion threshold hemoglobin less than 7.  5.  Hypertension Stable.  Continue home regimen of labetalol.  6.  Breast cancer Patient with history of right breast cancer with metastases to the bone diagnosed in May 2019.  Patient undergoing treatment with Taxol and carboplatin last infusion 03/03/2018.  Outpatient follow-up with oncology.  7.  Hypokalemia Magnesium level of 1.9.  Replete.   DVT prophylaxis:  Lovenox Code Status: Full Family Communication: Updated patient.  No family at bedside. Disposition Plan: Home when clinically improved, afebrile x48 hours, clinical improvement.   Consultants:   None  Procedures:   Chest x-ray 03/13/2018    Antimicrobials:   Augmentin 03/14/2018   Subjective: Patient laying in bed.  Some improvement with sinus pressure.  No nausea or emesis.  No chest pain no shortness of breath.  Objective: Vitals:   03/14/18 0613 03/14/18 0823 03/14/18 1318 03/14/18 1816  BP: (!) 153/88 135/75 (!) 138/92   Pulse: (!) 111 (!) 112 (!) 109   Resp: (!) 32 (!) 31 (!) 30   Temp: (!) 101.1 F (38.4 C) 98.9 F (37.2 C) 100 F (37.8 C) 100.1 F (37.8 C)  TempSrc: Oral Oral Oral Oral  SpO2: 98% 100% 97%   Weight:      Height:        Intake/Output Summary (Last 24 hours) at 03/14/2018 1857 Last data filed at 03/14/2018 1809 Gross per 24 hour  Intake 4154.32 ml  Output 1100 ml  Net 3054.32 ml   Filed Weights   03/13/18 1733 03/13/18 2224  Weight: 74.4 kg 73.6 kg    Examination:  General exam: Appears calm and comfortable.  Decreased pain over right frontal and maxillary sinus. Respiratory system: Clear to auscultation. Respiratory effort normal. Cardiovascular system: S1 & S2 heard, RRR. No JVD, murmurs, rubs, gallops or clicks. No pedal edema. Gastrointestinal system: Abdomen is nondistended, soft and nontender. No organomegaly or masses felt. Normal bowel sounds heard. Central nervous system: Alert and oriented. No focal neurological deficits. Extremities: Symmetric 5 x 5 power. Skin: No rashes, lesions or ulcers Psychiatry: Judgement and insight  appear normal. Mood & affect appropriate.     Data Reviewed: I have personally reviewed following labs and imaging studies  CBC: Recent Labs  Lab 03/13/18 1802 03/14/18 1010  WBC 7.5 7.7  NEUTROABS 5.3 5.3  HGB 8.3* 7.4*  HCT 26.0* 23.4*  MCV 97.7 100.4*  PLT 251 237   Basic Metabolic  Panel: Recent Labs  Lab 03/13/18 1802 03/14/18 0641 03/14/18 1010  NA 132* 134*  --   K 3.5 3.4*  --   CL 97* 102  --   CO2 22 22  --   GLUCOSE 124* 98  --   BUN 17 12  --   CREATININE 0.83 0.69  --   CALCIUM 9.3 8.7*  --   MG  --   --  1.9   GFR: Estimated Creatinine Clearance: 76.4 mL/min (by C-G formula based on SCr of 0.69 mg/dL). Liver Function Tests: Recent Labs  Lab 03/13/18 1802  AST 39  ALT 33  ALKPHOS 162*  BILITOT 0.9  PROT 8.1  ALBUMIN 4.0   No results for input(s): LIPASE, AMYLASE in the last 168 hours. No results for input(s): AMMONIA in the last 168 hours. Coagulation Profile: Recent Labs  Lab 03/13/18 1802  INR 1.30   Cardiac Enzymes: No results for input(s): CKTOTAL, CKMB, CKMBINDEX, TROPONINI in the last 168 hours. BNP (last 3 results) No results for input(s): PROBNP in the last 8760 hours. HbA1C: No results for input(s): HGBA1C in the last 72 hours. CBG: No results for input(s): GLUCAP in the last 168 hours. Lipid Profile: No results for input(s): CHOL, HDL, LDLCALC, TRIG, CHOLHDL, LDLDIRECT in the last 72 hours. Thyroid Function Tests: No results for input(s): TSH, T4TOTAL, FREET4, T3FREE, THYROIDAB in the last 72 hours. Anemia Panel: No results for input(s): VITAMINB12, FOLATE, FERRITIN, TIBC, IRON, RETICCTPCT in the last 72 hours. Sepsis Labs: Recent Labs  Lab 03/13/18 1826  LATICACIDVEN 1.13    Recent Results (from the past 240 hour(s))  Culture, blood (Routine x 2)     Status: None (Preliminary result)   Collection Time: 03/13/18  6:02 PM  Result Value Ref Range Status   Specimen Description   Final    BLOOD BLOOD LEFT HAND Performed at [facility], [place] [address] [place], [place] [address]    Special Requests   Final    BOTTLES DRAWN AEROBIC AND ANAEROBIC Blood Culture results may not be optimal due to an inadequate volume of blood received in culture bottles Performed at [facility] [address], [place], [place] [address]    Culture   Final    NO GROWTH < 24 HOURS Performed at [facility] [address] [place], [place] [address]    Report Status PENDING  Incomplete  Culture, blood (Routine x 2)     Status: None (Preliminary result)   Collection Time: 03/13/18  6:02 PM  Result Value Ref Range Status   Specimen Description   Final    BLOOD PORTA CATH Performed at [facility] [address] [place], [place] [address]    Special Requests   Final    BOTTLES DRAWN AEROBIC AND ANAEROBIC Blood Culture adequate volume Performed at [facility] [address], [place], [place] [address]    Culture   Final    NO GROWTH < 24 HOURS Performed at [facility] [address] [place], [place] [address]    Report Status PENDING  Incomplete         Radiology Studies: Dg Chest 2 View  Result Date: 03/13/2018 CLINICAL DATA:  Cough and fever EXAM: CHEST - 2 VIEW COMPARISON:  CT chest 12/22/2017 and CXR 03/05/2018 FINDINGS: Bilateral healing rib fractures are noted accounting for areas of subtle opacity the left lower lobe and right mid lung. No overt pulmonary edema, effusion or pneumothorax. Heart size and mediastinal contours are stable and within normal limits. Port catheter tip terminates at the cavoatrial junction without complicating features IMPRESSION: Healing bilateral rib fractures are believed to account for subtle opacities projecting over the right mid lung and left lung base. No active pulmonary disease. Electronically Signed   By: Ashley Royalty M.D.   On: 03/13/2018 18:13        Scheduled Meds: . amoxicillin-clavulanate  1 tablet Oral Q12H  . enoxaparin (LOVENOX) injection  40 mg Subcutaneous Q24H  . labetalol  300 mg Oral BID  . loratadine  10 mg Oral Daily   Continuous Infusions: . sodium chloride 125 mL/hr at 03/14/18 1733     LOS: 0 days    Time spent: 35 mins    Irine Seal,  MD [facility] Pager [phone]  If 7PM-7AM, please contact night-coverage [email or web address] Password [email or web address] 03/14/2018, 6:57 PM

## 2018-03-15 DIAGNOSIS — R6510 Systemic inflammatory response syndrome (SIRS) of non-infectious origin without acute organ dysfunction: Secondary | ICD-10-CM

## 2018-03-15 LAB — CBC WITH DIFFERENTIAL/PLATELET
Abs Immature Granulocytes: 0.07 10*3/uL (ref 0.00–0.07)
Basophils Absolute: 0 10*3/uL (ref 0.0–0.1)
Basophils Relative: 0 %
Eosinophils Absolute: 0 10*3/uL (ref 0.0–0.5)
Eosinophils Relative: 0 %
HEMATOCRIT: 22.2 % — AB (ref 36.0–46.0)
HEMOGLOBIN: 7 g/dL — AB (ref 12.0–15.0)
IMMATURE GRANULOCYTES: 1 %
LYMPHS ABS: 1.2 10*3/uL (ref 0.7–4.0)
LYMPHS PCT: 16 %
MCH: 30.6 pg (ref 26.0–34.0)
MCHC: 31.5 g/dL (ref 30.0–36.0)
MCV: 96.9 fL (ref 80.0–100.0)
Monocytes Absolute: 0.9 10*3/uL (ref 0.1–1.0)
Monocytes Relative: 13 %
NEUTROS ABS: 5.2 10*3/uL (ref 1.7–7.7)
NRBC: 0 % (ref 0.0–0.2)
Neutrophils Relative %: 70 %
Platelets: 216 10*3/uL (ref 150–400)
RBC: 2.29 MIL/uL — AB (ref 3.87–5.11)
RDW: 18.9 % — AB (ref 11.5–15.5)
WBC: 7.4 10*3/uL (ref 4.0–10.5)

## 2018-03-15 LAB — VITAMIN B12: Vitamin B-12: 997 pg/mL — ABNORMAL HIGH (ref 180–914)

## 2018-03-15 LAB — BASIC METABOLIC PANEL
Anion gap: 9 (ref 5–15)
BUN: 8 mg/dL (ref 6–20)
CALCIUM: 8.8 mg/dL — AB (ref 8.9–10.3)
CO2: 22 mmol/L (ref 22–32)
Chloride: 101 mmol/L (ref 98–111)
Creatinine, Ser: 0.58 mg/dL (ref 0.44–1.00)
GFR calc non Af Amer: >60 mL/min (ref >60)
Glucose, Bld: 97 mg/dL (ref 70–99)
Potassium: 3.2 mmol/L — ABNORMAL LOW (ref 3.5–5.1)
SODIUM: 132 mmol/L — AB (ref 135–145)

## 2018-03-15 LAB — RESPIRATORY PANEL BY PCR
Adenovirus: NOT DETECTED
Bordetella pertussis: NOT DETECTED
CORONAVIRUS 229E-RVPPCR: NOT DETECTED
CORONAVIRUS HKU1-RVPPCR: NOT DETECTED
Chlamydophila pneumoniae: NOT DETECTED
Coronavirus NL63: NOT DETECTED
Coronavirus OC43: NOT DETECTED
INFLUENZA B-RVPPCR: NOT DETECTED
Influenza A: NOT DETECTED
METAPNEUMOVIRUS-RVPPCR: NOT DETECTED
Mycoplasma pneumoniae: NOT DETECTED
Parainfluenza Virus 1: NOT DETECTED
Parainfluenza Virus 2: NOT DETECTED
Parainfluenza Virus 3: NOT DETECTED
Parainfluenza Virus 4: NOT DETECTED
RESPIRATORY SYNCYTIAL VIRUS-RVPPCR: NOT DETECTED
Rhinovirus / Enterovirus: NOT DETECTED

## 2018-03-15 LAB — IRON AND TIBC
Iron: 29 ug/dL (ref 28–170)
Saturation Ratios: 15 % (ref 10.4–31.8)
TIBC: 197 ug/dL — ABNORMAL LOW (ref 250–450)
UIBC: 168 ug/dL

## 2018-03-15 LAB — PROTIME-INR
INR: 1.31
Prothrombin Time: 16.2 seconds — ABNORMAL HIGH (ref 11.4–15.2)

## 2018-03-15 LAB — URINE CULTURE: Culture: <10000 — AB

## 2018-03-15 LAB — FOLATE: FOLATE: 13.9 ng/mL (ref >5.9)

## 2018-03-15 LAB — FERRITIN: Ferritin: 1835 ng/mL — ABNORMAL HIGH (ref 11–307)

## 2018-03-15 LAB — PROCALCITONIN: Procalcitonin: 0.45 ng/mL

## 2018-03-15 LAB — APTT: APTT: 38 s — AB (ref 24–36)

## 2018-03-15 MED ORDER — SODIUM CHLORIDE 0.9 % IV BOLUS
500.0000 mL | Freq: Once | INTRAVENOUS | Status: AC
  Administered 2018-03-15: 500 mL via INTRAVENOUS

## 2018-03-15 MED ORDER — SODIUM CHLORIDE 0.9 % IV SOLN
2.0000 g | Freq: Once | INTRAVENOUS | Status: AC
  Administered 2018-03-15: 2 g via INTRAVENOUS
  Filled 2018-03-15: qty 2

## 2018-03-15 MED ORDER — VANCOMYCIN HCL IN DEXTROSE 1-5 GM/200ML-% IV SOLN
1000.0000 mg | Freq: Once | INTRAVENOUS | Status: DC
  Filled 2018-03-15: qty 200

## 2018-03-15 MED ORDER — METRONIDAZOLE IN NACL 5-0.79 MG/ML-% IV SOLN
500.0000 mg | Freq: Three times a day (TID) | INTRAVENOUS | Status: DC
  Administered 2018-03-15 – 2018-03-16 (×4): 500 mg via INTRAVENOUS
  Filled 2018-03-15 (×4): qty 100

## 2018-03-15 MED ORDER — VANCOMYCIN HCL 10 G IV SOLR
1500.0000 mg | Freq: Once | INTRAVENOUS | Status: AC
  Administered 2018-03-15: 1500 mg via INTRAVENOUS
  Filled 2018-03-15: qty 1500

## 2018-03-15 MED ORDER — SODIUM CHLORIDE 0.9 % IV SOLN
1.0000 g | Freq: Three times a day (TID) | INTRAVENOUS | Status: DC
  Administered 2018-03-15 – 2018-03-16 (×3): 1 g via INTRAVENOUS
  Filled 2018-03-15 (×5): qty 1

## 2018-03-15 MED ORDER — POTASSIUM CHLORIDE CRYS ER 20 MEQ PO TBCR
40.0000 meq | EXTENDED_RELEASE_TABLET | Freq: Once | ORAL | Status: AC
  Administered 2018-03-15: 40 meq via ORAL
  Filled 2018-03-15: qty 2

## 2018-03-15 MED ORDER — VANCOMYCIN HCL IN DEXTROSE 1-5 GM/200ML-% IV SOLN: 1000.0000 mg | INTRAVENOUS | Status: DC

## 2018-03-15 NOTE — Plan of Care (Signed)
Plan of care reviewed and discussed with patient.  Denies questions at this time.

## 2018-03-15 NOTE — Progress Notes (Signed)
Temp 102.6, MD updated. SRP, RN

## 2018-03-15 NOTE — Progress Notes (Signed)
Pharmacy Antibiotic Note  Olivia Patel is a 57 y.o. female admitted on 03/13/2018 with SIRS, possible sinusitis.  Pharmacy has been consulted for vancomycin and cefepime dosing; MD also ordered Flagyl.  Tmax 100.6 WBC wnl SCr 0.58, CrCl ~76 ml/min PCT pending  Plan:  Vancomycin 1500mg  IV x 1, then 1g q24h for estimated AUC 428  Check vancomycin levels at steady state, goal AUC 400-500  Cefepime 2g IV x 1, then 1g IV q8h  Flagyl 500mg  IV q8h per MD  Height: 5\' 3"  (160 cm) Weight: 162 lb 4.1 oz (73.6 kg) IBW/kg (Calculated) : 52.4  Temp (24hrs), Avg:100.4 F (38 C), Min:99.4 F (37.4 C), Max:102 F (38.9 C)  Recent Labs  Lab 03/13/18 1802 03/13/18 1826 03/14/18 0641 03/14/18 1010 03/15/18 0432  WBC 7.5  --   --  7.7 7.4  CREATININE 0.83  --  0.69  --  0.58  LATICACIDVEN  --  1.13  --   --   --     Estimated Creatinine Clearance: 76.4 mL/min (by C-G formula based on SCr of 0.58 mg/dL).    No Known Allergies  Antimicrobials this admission:  11/18 Augmentin >> 11/20 11/20 Vancomycin >> 11/20 Cefepime >> 11/20 Flagyl >>  Dose adjustments this admission:    Microbiology results:  11/18 BCx: ngtd 11/18 Influenza panel: neg 11/19 UCx: <10k insignificant growth 11/20 Respiratory panel:  Thank you for allowing pharmacy to be a part of this patient's care.  Peggyann Juba, PharmD, BCPS Pager: [phone] 03/15/2018 11:55 AM

## 2018-03-15 NOTE — Progress Notes (Signed)
PROGRESS NOTE    Olivia Patel  IPJ:[id number] DOB: 01-29-1961 DOA: 03/13/2018 PCP: Lance Sell, NP    Brief Narrative: 57 year old with PMH significant for breast cancer with osseous mets presents to ED with fever, sinus congestion, right eye pain. Chest x ray negative for infection. Influenza PCR negative. Patient was admitted, started on IV fluids, Augmentin for presumed sinusitis.  Patient continued to have fevers, and SIRS  Criteria. Patient will be a started on broader spectrum IV antibiotics.   Assessment & Plan:   Principal Problem:   SIRS (systemic inflammatory response syndrome) (HCC) Active Problems:   Essential hypertension   Anemia   Breast cancer (HCC)   Hyponatremia   Acute bacterial sinusitis  1-SIRS;  Patient continued to have fever, tachycardia. Chest x-ray negative. Initial blood cultures 03-13-2018 ; negative to date. Will change Augmentin to vancomycin and cefepime. Patient immunocompromised, getting chemo.  Will check viral respiratory panel. Check pro calcitonin level. Will repeat blood cultures  2-Hyponatremia; persists continue with IV fluids.  3- Hypokalemia; replete orally, with 40 mEq of KCl.  4-Anemia; related to chemotherapy. Some concern with hemodilution. Repeat hemoglobin in the morning and if less than 7 worsening blood transfusion.  5-Hypertension; continue with labetalol.  6-Breast cancer, with metastasis to bone diagnosed May 2019. Patient undergoing treatment with Taxol and carboplatin, last infusion 03/03/2018. Follow with Dr. Learta Codding  RN Pressure Injury Documentation:    Malnutrition Type:      Malnutrition Characteristics:      Nutrition Interventions:     Estimated body mass index is 28.74 kg/m as calculated from the following:   Height as of this encounter: 5\' 3"  (1.6 m).   Weight as of this encounter: 73.6 kg.   DVT prophylaxis: Lovenox Code Status: full code Family Communication: care  discussed with patient. Disposition Plan: remaining in the hospital with IV antibiotics, patient is still spiking fevers.  Consultants:   none   Procedures:  none   Antimicrobials:   Vancomycin 03/15/2018  Cefepime 03/15/2018   Subjective: Patient was noted to have a temperature of 102 this morning, he was very tachypnea.  She reports sinus congestion, and drainage. She relates headache has improved.  She is still feel weak and tired. She denies dysuria, abdominal pain or productive cough  Objective: Vitals:   03/14/18 2244 03/15/18 0548 03/15/18 0730 03/15/18 0956  BP: (!) 143/87 139/79  131/75  Pulse: (!) 113 (!) 107  (!) 105  Resp: (!) 30 (!) 31 16   Temp: (!) 100.6 F (38.1 C) (!) 102 F (38.9 C) (!) 100.5 F (38.1 C) 99.4 F (37.4 C)  TempSrc: Oral Oral  Oral  SpO2: 97% 95%    Weight:      Height:        Intake/Output Summary (Last 24 hours) at 03/15/2018 1003 Last data filed at 03/15/2018 0600 Gross per 24 hour  Intake 3277.27 ml  Output 1550 ml  Net 1727.27 ml   Filed Weights   03/13/18 1733 03/13/18 2224  Weight: 74.4 kg 73.6 kg    Examination:  General exam: Appears calm and comfortable  Respiratory system: . Rhonchorous. Mild tachypnea Cardiovascular system: S1 & S2 heard, RRR. No JVD, murmurs, rubs, gallops or clicks. No pedal edema. Gastrointestinal system: Abdomen is nondistended, soft and nontender. No organomegaly or masses felt. Normal bowel sounds heard. Central nervous system: Alert and oriented. No focal neurological deficits. Extremities: Symmetric 5 x 5 power. Skin: No rashes, lesions or ulcers Psychiatry: Judgement  and insight appear normal. Mood & affect appropriate.     Data Reviewed: I have personally reviewed following labs and imaging studies  CBC: Recent Labs  Lab 03/13/18 1802 03/14/18 1010 03/15/18 0432  WBC 7.5 7.7 7.4  NEUTROABS 5.3 5.3 5.2  HGB 8.3* 7.4* 7.0*  HCT 26.0* 23.4* 22.2*  MCV 97.7 100.4* 96.9    PLT 251 214 825   Basic Metabolic Panel: Recent Labs  Lab 03/13/18 1802 03/14/18 0641 03/14/18 1010 03/15/18 0432  NA 132* 134*  --  132*  K 3.5 3.4*  --  3.2*  CL 97* 102  --  101  CO2 22 22  --  22  GLUCOSE 124* 98  --  97  BUN 17 12  --  8  CREATININE 0.83 0.69  --  0.58  CALCIUM 9.3 8.7*  --  8.8*  MG  --   --  1.9  --    GFR: Estimated Creatinine Clearance: 76.4 mL/min (by C-G formula based on SCr of 0.58 mg/dL). Liver Function Tests: Recent Labs  Lab 03/13/18 1802  AST 39  ALT 33  ALKPHOS 162*  BILITOT 0.9  PROT 8.1  ALBUMIN 4.0   No results for input(s): LIPASE, AMYLASE in the last 168 hours. No results for input(s): AMMONIA in the last 168 hours. Coagulation Profile: Recent Labs  Lab 03/13/18 1802  INR 1.30   Cardiac Enzymes: No results for input(s): CKTOTAL, CKMB, CKMBINDEX, TROPONINI in the last 168 hours. BNP (last 3 results) No results for input(s): PROBNP in the last 8760 hours. HbA1C: No results for input(s): HGBA1C in the last 72 hours. CBG: No results for input(s): GLUCAP in the last 168 hours. Lipid Profile: No results for input(s): CHOL, HDL, LDLCALC, TRIG, CHOLHDL, LDLDIRECT in the last 72 hours. Thyroid Function Tests: No results for input(s): TSH, T4TOTAL, FREET4, T3FREE, THYROIDAB in the last 72 hours. Anemia Panel: Recent Labs    03/15/18 0432  VITAMINB12 997*  FOLATE 13.9  FERRITIN 1,835*  TIBC 197*  IRON 29   Sepsis Labs: Recent Labs  Lab 03/13/18 1826  LATICACIDVEN 1.13    Recent Results (from the past 240 hour(s))  Culture, blood (Routine x 2)     Status: None (Preliminary result)   Collection Time: 03/13/18  6:02 PM  Result Value Ref Range Status   Specimen Description   Final    BLOOD BLOOD LEFT HAND Performed at [facility] [address] [place], [place] [address]    Special Requests   Final    BOTTLES DRAWN AEROBIC AND ANAEROBIC Blood Culture results may not be optimal due to an  inadequate volume of blood received in culture bottles Performed at [facility] [address], [place], [place] [address]    Culture   Final    NO GROWTH < 24 HOURS Performed at [facility] [address] [place], [place] [address]    Report Status PENDING  Incomplete  Culture, blood (Routine x 2)     Status: None (Preliminary result)   Collection Time: 03/13/18  6:02 PM  Result Value Ref Range Status   Specimen Description   Final    BLOOD PORTA CATH Performed at [facility] [address] [place], [place] [address]    Special Requests   Final    BOTTLES DRAWN AEROBIC AND ANAEROBIC Blood Culture adequate volume Performed at [facility] [address] [place], [place] [address]  Culture   Final    NO GROWTH < 24 HOURS Performed at [facility], [place] [address], [place], [place] [address]    Report Status PENDING  Incomplete  Urine culture     Status: Abnormal   Collection Time: 03/14/18  4:51 AM  Result Value Ref Range Status   Specimen Description   Final    URINE, CLEAN CATCH Performed at [facility], [place] [address] [place], [place] [address]    Special Requests   Final    NONE Performed at [facility], [place] [address], [place], [place] [address]    Culture (A)  Final    <10,000 COLONIES/mL INSIGNIFICANT GROWTH Performed at [facility] [address], [place], [place] [address]    Report Status 03/15/2018 FINAL  Final         Radiology Studies: Dg Chest 2 View  Result Date: 03/13/2018 CLINICAL DATA:  Cough and fever EXAM: CHEST - 2 VIEW COMPARISON:  CT chest 12/22/2017 and CXR 03/05/2018 FINDINGS: Bilateral healing rib fractures are noted accounting for areas of subtle opacity the left lower lobe and right mid lung. No overt pulmonary edema, effusion or pneumothorax. Heart size and mediastinal contours are stable and within normal  limits. Port catheter tip terminates at the cavoatrial junction without complicating features IMPRESSION: Healing bilateral rib fractures are believed to account for subtle opacities projecting over the right mid lung and left lung base. No active pulmonary disease. Electronically Signed   By: Ashley Royalty M.D.   On: 03/13/2018 18:13        Scheduled Meds: . enoxaparin (LOVENOX) injection  40 mg Subcutaneous Q24H  . labetalol  300 mg Oral BID  . loratadine  10 mg Oral Daily   Continuous Infusions: . sodium chloride 100 mL/hr at 03/15/18 1002  . ceFEPime (MAXIPIME) IV    . metronidazole    . vancomycin       LOS: 1 day    Time spent: 35 minutes    Elmarie Shiley, MD [facility] Pager [phone]  If 7PM-7AM, please contact night-coverage [email or web address] Password [email or web address] 03/15/2018, 10:03 AM

## 2018-03-16 LAB — CBC
HEMATOCRIT: 21 % — AB (ref 36.0–46.0)
HEMOGLOBIN: 6.5 g/dL — AB (ref 12.0–15.0)
MCH: 30.7 pg (ref 26.0–34.0)
MCHC: 31 g/dL (ref 30.0–36.0)
MCV: 99.1 fL (ref 80.0–100.0)
NRBC: 0.2 % (ref 0.0–0.2)
Platelets: 182 10*3/uL (ref 150–400)
RBC: 2.12 MIL/uL — AB (ref 3.87–5.11)
RDW: 19.2 % — AB (ref 11.5–15.5)
WBC: 8.4 10*3/uL (ref 4.0–10.5)

## 2018-03-16 LAB — BASIC METABOLIC PANEL
ANION GAP: 9 (ref 5–15)
BUN: 9 mg/dL (ref 6–20)
CALCIUM: 8.9 mg/dL (ref 8.9–10.3)
CHLORIDE: 107 mmol/L (ref 98–111)
CO2: 22 mmol/L (ref 22–32)
Creatinine, Ser: 0.66 mg/dL (ref 0.44–1.00)
GFR calc Af Amer: >60 mL/min (ref >60)
GFR calc non Af Amer: >60 mL/min (ref >60)
GLUCOSE: 114 mg/dL — AB (ref 70–99)
Potassium: 3.3 mmol/L — ABNORMAL LOW (ref 3.5–5.1)
Sodium: 138 mmol/L (ref 135–145)

## 2018-03-16 LAB — ABO/RH: ABO/RH(D): O NEG

## 2018-03-16 LAB — PREPARE RBC (CROSSMATCH)

## 2018-03-16 MED ORDER — METRONIDAZOLE IN NACL 5-0.79 MG/ML-% IV SOLN
500.0000 mg | Freq: Three times a day (TID) | INTRAVENOUS | Status: DC
  Administered 2018-03-16 – 2018-03-17 (×3): 500 mg via INTRAVENOUS
  Filled 2018-03-16 (×3): qty 100

## 2018-03-16 MED ORDER — SODIUM CHLORIDE 0.9% IV SOLUTION
Freq: Once | INTRAVENOUS | Status: AC
  Administered 2018-03-16: 16:00:00 via INTRAVENOUS

## 2018-03-16 MED ORDER — POTASSIUM CHLORIDE CRYS ER 20 MEQ PO TBCR
40.0000 meq | EXTENDED_RELEASE_TABLET | Freq: Once | ORAL | Status: AC
  Administered 2018-03-16: 40 meq via ORAL
  Filled 2018-03-16: qty 2

## 2018-03-16 MED ORDER — SODIUM CHLORIDE 0.9% IV SOLUTION: Freq: Once | INTRAVENOUS | Status: AC

## 2018-03-16 MED ORDER — TRAMADOL HCL 50 MG PO TABS
50.0000 mg | ORAL_TABLET | Freq: Four times a day (QID) | ORAL | Status: DC | PRN
  Administered 2018-03-16 – 2018-03-18 (×4): 50 mg via ORAL
  Filled 2018-03-16 (×4): qty 1

## 2018-03-16 MED ORDER — VANCOMYCIN HCL IN DEXTROSE 1-5 GM/200ML-% IV SOLN
1000.0000 mg | INTRAVENOUS | Status: DC
  Administered 2018-03-16: 1000 mg via INTRAVENOUS
  Filled 2018-03-16: qty 200

## 2018-03-16 MED ORDER — ACETAMINOPHEN 325 MG PO TABS
650.0000 mg | ORAL_TABLET | Freq: Once | ORAL | Status: AC
  Administered 2018-03-16: 650 mg via ORAL
  Filled 2018-03-16: qty 2

## 2018-03-16 MED ORDER — SODIUM CHLORIDE 0.9 % IV SOLN
1.0000 g | Freq: Three times a day (TID) | INTRAVENOUS | Status: DC
  Administered 2018-03-16 – 2018-03-18 (×5): 1 g via INTRAVENOUS
  Filled 2018-03-16 (×6): qty 1

## 2018-03-16 NOTE — Progress Notes (Signed)
CRITICAL VALUE ALERT  Critical Value:  Hgb 6.5  Date & Time Notied:  03/16/18  Provider Notified: K.Schorr  Orders Received/Actions taken: 1 Unit of PRBC

## 2018-03-16 NOTE — Progress Notes (Signed)
PROGRESS NOTE    Olivia Patel  YCX:[id number] DOB: Oct 19, 1960 DOA: 03/13/2018 PCP: Lance Sell, NP    Brief Narrative: 57 year old with PMH significant for breast cancer with osseous mets presents to ED with fever, sinus congestion, right eye pain. Chest x ray negative for infection. Influenza PCR negative. Patient was admitted, started on IV fluids, Augmentin for presumed sinusitis.  Patient continued to have fevers, and SIRS  Criteria. Patient will be a started on broader spectrum IV antibiotics.   Assessment & Plan:   Principal Problem:   SIRS (systemic inflammatory response syndrome) (HCC) Active Problems:   Essential hypertension   Anemia   Breast cancer (HCC)   Hyponatremia   Acute bacterial sinusitis  1-SIRS;  Patient continued to have fever, tachycardia. Chest x-ray negative. Initial blood cultures 03-13-2018 ; negative to date. change Augmentin to vancomycin and cefepime. Patient immunocompromised, getting chemo.  Viral respiratory panel.negative pro calcitonin level. 0.45 folow Repeated blood cultures 11-20, pending If fever persist, might need CT sinus, chest   2-Hyponatremia; improved.  continue with IV fluids.  3- Hypokalemia; replete orally.   4-Anemia; related to chemotherapy. Some concern with hemodilution. Repeat hemoglobin in the morning and if less than 7 worsening blood transfusion. Transfuse 2 units PRBC>   5-Hypertension; continue with labetalol.  6-Breast cancer, with metastasis to bone diagnosed May 2019. Patient undergoing treatment with Taxol and carboplatin, last infusion 03/03/2018. Follow with Dr. Learta Codding  RN Pressure Injury Documentation:    Malnutrition Type:      Malnutrition Characteristics:      Nutrition Interventions:     Estimated body mass index is 28.74 kg/m as calculated from the following:   Height as of this encounter: 5\' 3"  (1.6 m).   Weight as of this encounter: 73.6 kg.   DVT prophylaxis: Hold  lovenox due to anemia Code Status: full code Family Communication: care discussed with patient. Disposition Plan: remaining in the hospital with IV antibiotics, patient is still spiking fevers.  Consultants:   none   Procedures:  none   Antimicrobials:   Vancomycin 03/15/2018  Cefepime 03/15/2018   Subjective: She denies cough, feeling weak.  SOB.   Objective: Vitals:   03/16/18 0406 03/16/18 1037 03/16/18 1337 03/16/18 1410  BP: 120/78 121/76 134/75 132/78  Pulse: (!) 101 (!) 104 (!) 105 (!) 108  Resp: 20 (!) 32 (!) 24 20  Temp: 99.4 F (37.4 C) 98.8 F (37.1 C) 99.2 F (37.3 C) 99.3 F (37.4 C)  TempSrc: Oral Oral Oral Oral  SpO2: 98% 98% 99% 100%  Weight:      Height:        Intake/Output Summary (Last 24 hours) at 03/16/2018 1536 Last data filed at 03/16/2018 0406 Gross per 24 hour  Intake 1888.9 ml  Output -  Net 1888.9 ml   Filed Weights   03/13/18 1733 03/13/18 2224  Weight: 74.4 kg 73.6 kg    Examination:  General exam: NAD Respiratory system: . Bilateral air movement. [place].  Cardiovascular system: S 1, S 2 RRR Gastrointestinal system: BS present, soft, nt Central nervous system: non focal.  Extremities: no edema Skin: no rashes.  Psychiatry: mood and affect appropriate    Data Reviewed: I have personally reviewed following labs and imaging studies  CBC: Recent Labs  Lab 03/13/18 1802 03/14/18 1010 03/15/18 0432 03/16/18 0414  WBC 7.5 7.7 7.4 8.4  NEUTROABS 5.3 5.3 5.2  --   HGB 8.3* 7.4* 7.0* 6.5*  HCT 26.0* 23.4* 22.2* 21.0*  MCV 97.7 100.4* 96.9 99.1  PLT 251 214 216 245   Basic Metabolic Panel: Recent Labs  Lab 03/13/18 1802 03/14/18 0641 03/14/18 1010 03/15/18 0432 03/16/18 0414  NA 132* 134*  --  132* 138  K 3.5 3.4*  --  3.2* 3.3*  CL 97* 102  --  101 107  CO2 22 22  --  22 22  GLUCOSE 124* 98  --  97 114*  BUN 17 12  --  8 9  CREATININE 0.83 0.69  --  0.58 0.66  CALCIUM 9.3 8.7*  --  8.8* 8.9  MG  --    --  1.9  --   --    GFR: Estimated Creatinine Clearance: 76.4 mL/min (by C-G formula based on SCr of 0.66 mg/dL). Liver Function Tests: Recent Labs  Lab 03/13/18 1802  AST 39  ALT 33  ALKPHOS 162*  BILITOT 0.9  PROT 8.1  ALBUMIN 4.0   No results for input(s): LIPASE, AMYLASE in the last 168 hours. No results for input(s): AMMONIA in the last 168 hours. Coagulation Profile: Recent Labs  Lab 03/13/18 1802 03/15/18 1229  INR 1.30 1.31   Cardiac Enzymes: No results for input(s): CKTOTAL, CKMB, CKMBINDEX, TROPONINI in the last 168 hours. BNP (last 3 results) No results for input(s): PROBNP in the last 8760 hours. HbA1C: No results for input(s): HGBA1C in the last 72 hours. CBG: No results for input(s): GLUCAP in the last 168 hours. Lipid Profile: No results for input(s): CHOL, HDL, LDLCALC, TRIG, CHOLHDL, LDLDIRECT in the last 72 hours. Thyroid Function Tests: No results for input(s): TSH, T4TOTAL, FREET4, T3FREE, THYROIDAB in the last 72 hours. Anemia Panel: Recent Labs    03/15/18 0432  VITAMINB12 997*  FOLATE 13.9  FERRITIN 1,835*  TIBC 197*  IRON 29   Sepsis Labs: Recent Labs  Lab 03/13/18 1826 03/15/18 1229  PROCALCITON  --  0.45  LATICACIDVEN 1.13  --     Recent Results (from the past 240 hour(s))  Culture, blood (Routine x 2)     Status: None (Preliminary result)   Collection Time: 03/13/18  6:02 PM  Result Value Ref Range Status   Specimen Description   Final    BLOOD BLOOD LEFT HAND Performed at [facility] [address] [place], [place] [address]    Special Requests   Final    BOTTLES DRAWN AEROBIC AND ANAEROBIC Blood Culture results may not be optimal due to an inadequate volume of blood received in culture bottles Performed at [facility] [address], [place], [place] [address]    Culture   Final    NO GROWTH 3 DAYS Performed at [facility], [place] [address] [place], [place] [address]      Report Status PENDING  Incomplete  Culture, blood (Routine x 2)     Status: None (Preliminary result)   Collection Time: 03/13/18  6:02 PM  Result Value Ref Range Status   Specimen Description   Final    BLOOD PORTA CATH Performed at [facility] [address] [place], [place] [address]    Special Requests   Final    BOTTLES DRAWN AEROBIC AND ANAEROBIC Blood Culture adequate volume Performed at [facility] [address], [place], [place] [address]    Culture   Final    NO GROWTH 3 DAYS Performed at [facility], [place] [address], [place],  [address]  Report Status PENDING  Incomplete  Urine culture     Status: Abnormal   Collection Time: 03/14/18  4:51 AM  Result Value Ref Range Status   Specimen Description   Final    URINE, CLEAN CATCH Performed at [facility], [place] [address] [place], [place] [address]    Special Requests   Final    NONE Performed at [facility], [place] [address], [place], [place] [address]    Culture (A)  Final    <10,000 COLONIES/mL INSIGNIFICANT GROWTH Performed at [facility] [address], [place], [place] [address]    Report Status 03/15/2018 FINAL  Final  Respiratory Panel by PCR     Status: None   Collection Time: 03/15/18 10:19 AM  Result Value Ref Range Status   Adenovirus NOT DETECTED NOT DETECTED Final   Coronavirus 229E NOT DETECTED NOT DETECTED Final   Coronavirus HKU1 NOT DETECTED NOT DETECTED Final   Coronavirus NL63 NOT DETECTED NOT DETECTED Final   Coronavirus OC43 NOT DETECTED NOT DETECTED Final   Metapneumovirus NOT DETECTED NOT DETECTED Final   Rhinovirus / Enterovirus NOT DETECTED NOT DETECTED Final   Influenza A NOT DETECTED NOT DETECTED Final   Influenza B NOT DETECTED NOT DETECTED Final   Parainfluenza Virus 1 NOT DETECTED NOT DETECTED Final   Parainfluenza Virus 2 NOT DETECTED NOT DETECTED Final   Parainfluenza Virus 3  NOT DETECTED NOT DETECTED Final   Parainfluenza Virus 4 NOT DETECTED NOT DETECTED Final   Respiratory Syncytial Virus NOT DETECTED NOT DETECTED Final   Bordetella pertussis NOT DETECTED NOT DETECTED Final   Chlamydophila pneumoniae NOT DETECTED NOT DETECTED Final   Mycoplasma pneumoniae NOT DETECTED NOT DETECTED Final    Comment: Performed at [facility], [place] [address] [place], [place] [address]  Culture, blood (routine x 2)     Status: None (Preliminary result)   Collection Time: 03/15/18  5:13 PM  Result Value Ref Range Status   Specimen Description   Final    BLOOD RIGHT ARM Performed at [facility] [address] [place], [place] [address]    Special Requests   Final    BOTTLES DRAWN AEROBIC AND ANAEROBIC Blood Culture adequate volume Performed at [facility] [address], [place], [place] [address]    Culture   Final    NO GROWTH < 24 HOURS Performed at [facility] [address] [place], [place] [address]    Report Status PENDING  Incomplete  Culture, blood (routine x 2)     Status: None (Preliminary result)   Collection Time: 03/15/18  5:13 PM  Result Value Ref Range Status   Specimen Description BLOOD BLOOD RIGHT FOREARM  Final   Special Requests   Final    BOTTLES DRAWN AEROBIC AND ANAEROBIC Blood Culture adequate volume Performed at [facility] [address] [place], [place] [address]    Culture   Final    NO GROWTH < 24 HOURS Performed at [facility] [address], [place], [place] [address]    Report Status PENDING  Incomplete         Radiology Studies: No results found.      Scheduled Meds: . sodium chloride   Intravenous Once  . labetalol  300 mg Oral BID  . loratadine  10 mg Oral Daily   Continuous Infusions: . ceFEPime (MAXIPIME) IV 1 g (03/16/18 1201)  .  metronidazole 500 mg (03/16/18 1202)  . vancomycin       LOS: 2 days    Time spent: 35  minutes    Elmarie Shiley, MD [facility] Pager [phone]  If 7PM-7AM, please contact night-coverage [email or web address] Password [facility] 03/16/2018, 3:36 PM

## 2018-03-16 NOTE — Progress Notes (Signed)
Temp 100.2--additional Tylendol 650 mg given as ordered, blood transfusion administered. SRP, RN

## 2018-03-16 NOTE — Progress Notes (Signed)
Respiratory Panel resulted negative. Will notify MD. PPE discontinued. SRP,RN

## 2018-03-17 ENCOUNTER — Inpatient Hospital Stay: Payer: Medicare Other

## 2018-03-17 ENCOUNTER — Inpatient Hospital Stay: Payer: Medicare Other | Admitting: Nurse Practitioner

## 2018-03-17 LAB — TYPE AND SCREEN
ABO/RH(D): O NEG
Antibody Screen: NEGATIVE
Unit division: 0
Unit division: 0

## 2018-03-17 LAB — BASIC METABOLIC PANEL
Anion gap: 12 (ref 5–15)
BUN: 7 mg/dL (ref 6–20)
CALCIUM: 9.2 mg/dL (ref 8.9–10.3)
CO2: 23 mmol/L (ref 22–32)
CREATININE: 0.64 mg/dL (ref 0.44–1.00)
Chloride: 100 mmol/L (ref 98–111)
GFR calc Af Amer: >60 mL/min (ref >60)
GLUCOSE: 105 mg/dL — AB (ref 70–99)
Potassium: 3.1 mmol/L — ABNORMAL LOW (ref 3.5–5.1)
SODIUM: 135 mmol/L (ref 135–145)

## 2018-03-17 LAB — CBC
HCT: 29.2 % — ABNORMAL LOW (ref 36.0–46.0)
Hemoglobin: 9.5 g/dL — ABNORMAL LOW (ref 12.0–15.0)
MCH: 29.4 pg (ref 26.0–34.0)
MCHC: 32.5 g/dL (ref 30.0–36.0)
MCV: 90.4 fL (ref 80.0–100.0)
PLATELETS: 189 10*3/uL (ref 150–400)
RBC: 3.23 MIL/uL — ABNORMAL LOW (ref 3.87–5.11)
RDW: 19.7 % — AB (ref 11.5–15.5)
WBC: 9.8 10*3/uL (ref 4.0–10.5)
nRBC: 0.2 % (ref 0.0–0.2)

## 2018-03-17 LAB — BPAM RBC
Blood Product Expiration Date: 201912202359
Blood Product Expiration Date: 201912202359
ISSUE DATE / TIME: 201911211346
ISSUE DATE / TIME: 201911211808
Unit Type and Rh: 9500
Unit Type and Rh: 9500

## 2018-03-17 MED ORDER — POTASSIUM CHLORIDE CRYS ER 20 MEQ PO TBCR: 40.0000 meq | EXTENDED_RELEASE_TABLET | Freq: Once | ORAL | Status: DC

## 2018-03-17 MED ORDER — FLUTICASONE PROPIONATE 50 MCG/ACT NA SUSP
1.0000 | Freq: Every day | NASAL | Status: DC
  Administered 2018-03-18: 1 via NASAL
  Filled 2018-03-17: qty 16

## 2018-03-17 MED ORDER — POTASSIUM CHLORIDE CRYS ER 20 MEQ PO TBCR
40.0000 meq | EXTENDED_RELEASE_TABLET | Freq: Once | ORAL | Status: AC
  Administered 2018-03-17: 40 meq via ORAL
  Filled 2018-03-17: qty 2

## 2018-03-17 NOTE — Evaluation (Signed)
Physical Therapy Evaluation Patient Details Name: Olivia Patel MRN: [id number] DOB: 1961/02/17 Today's Date: 03/17/2018   History of Present Illness  Olivia Patel is a 57 y.o. female with medical history significant for hypertension, anemia, and cancer of the right breast with osseous metastases, now presenting to the emergency department for evaluation of fevers, chills, and malaise.    Clinical Impression  Patient presents with decreased balance and newly reliant on RW.  Feel she is likely close to baseline, but would benefit from skilled PT in the acute setting for balance training and maximized independence prior to d/c home.  Likely does not need follow up PT>     Follow Up Recommendations No PT follow up    Equipment Recommendations  None recommended by PT    Recommendations for Other Services       Precautions / Restrictions Precautions Precautions: Fall      Mobility  Bed Mobility Overal bed mobility: Modified Independent             General bed mobility comments: utilizing rolling to side technique appropriately for back pain and using rail to sit up  Transfers Overall transfer level: Needs assistance Equipment used: 4-wheeled walker Transfers: Sit to/from Stand Sit to Stand: Supervision         General transfer comment: for safety due to lines, limited space  Ambulation/Gait Ambulation/Gait assistance: Supervision Gait Distance (Feet): 200 Feet Assistive device: 4-wheeled walker Gait Pattern/deviations: Step-through pattern     General Gait Details: good use of walker, did park it in room due to limited space and held onto bed, but no LOB or unsafe practices  Stairs            Wheelchair Mobility    Modified Rankin (Stroke Patients Only)       Balance Overall balance assessment: Needs assistance   Sitting balance-Leahy Scale: Good       Standing balance-Leahy Scale: Fair Standing balance comment: appropriately using  walker to decrease fall risk                             Pertinent Vitals/Pain Pain Assessment: Faces Faces Pain Scale: Hurts a little bit Pain Location: back initially Pain Descriptors / Indicators: Aching Pain Intervention(s): Heat applied    Home Living Family/patient expects to be discharged to:: Private residence Living Arrangements: Children Available Help at Discharge: Family;Available 24 hours/day Type of Home: Apartment Home Access: Level entry     Home Layout: One level Home Equipment: Walker - 4 wheels      Prior Function Level of Independence: Needs assistance      ADL's / Homemaking Assistance Needed: daughters available, but she showers on her own  Comments: using rollator at home     Hand Dominance        Extremity/Trunk Assessment        Lower Extremity Assessment Lower Extremity Assessment: Overall WFL for tasks assessed       Communication   Communication: No difficulties  Cognition Arousal/Alertness: Awake/alert Behavior During Therapy: WFL for tasks assessed/performed Overall Cognitive Status: Within Functional Limits for tasks assessed                                        General Comments General comments (skin integrity, edema, etc.): encouraged hallway ambulation when asking nursing to help to bathroom, she toileted  this session on her own on low commode with grabbar    Exercises     Assessment/Plan    PT Assessment Patient needs continued PT services  PT Problem List Decreased strength;Decreased mobility;Decreased knowledge of use of DME       PT Treatment Interventions DME instruction;Gait training;Therapeutic exercise;Functional mobility training    PT Goals (Current goals can be found in the Care Plan section)  Acute Rehab PT Goals Patient Stated Goal: to return home PT Goal Formulation: With patient Time For Goal Achievement: 03/12/2018 Potential to Achieve Goals: Good    Frequency Min  3X/week   Barriers to discharge        Co-evaluation               AM-PAC PT "6 Clicks" Daily Activity  Outcome Measure Difficulty turning over in bed (including adjusting bedclothes, sheets and blankets)?: A Little Difficulty moving from lying on back to sitting on the side of the bed? : Unable(using rail) Difficulty sitting down on and standing up from a chair with arms (e.g., wheelchair, bedside commode, etc,.)?: Unable Help needed moving to and from a bed to chair (including a wheelchair)?: A Little Help needed walking in hospital room?: A Little Help needed climbing 3-5 steps with a railing? : A Little 6 Click Score: 14    End of Session   Activity Tolerance: Patient tolerated treatment well Patient left: in bed;with call bell/phone within reach   PT Visit Diagnosis: Muscle weakness (generalized) (M62.81)    Time: 1638-4665 PT Time Calculation (min) (ACUTE ONLY): 23 min   Charges:   PT Evaluation $PT Eval Low Complexity: 1 Low PT Treatments $Gait Training: 8-22 mins        Magda Kiel, PT Acute Rehabilitation Services [phone] 03/17/2018   Reginia Naas 03/17/2018, 4:00 PM

## 2018-03-17 NOTE — Care Management Important Message (Signed)
Important Message  Patient Details  Name: Olivia Patel MRN: [id number] Date of Birth: 1960/08/07   Medicare Important Message Given:  Yes    Kerin Salen 03/17/2018, 11:49 AMImportant Message  Patient Details  Name: Olivia Patel MRN: [id number] Date of Birth: 03-Sep-1960   Medicare Important Message Given:  Yes    Kerin Salen 03/17/2018, 11:49 AM

## 2018-03-17 NOTE — Progress Notes (Signed)
PROGRESS NOTE    Olivia Patel  OMV:[id number] DOB: [birth year] DOA: 03/13/2018 PCP: Lance Sell, NP    Brief Narrative: 57 year old with PMH significant for breast cancer with osseous mets presents to ED with fever, sinus congestion, right eye pain. Chest x ray negative for infection. Influenza PCR negative. Patient was admitted, started on IV fluids, Augmentin for presumed sinusitis.  Patient continued to have fevers, and SIRS  Criteria. Patient will be a started on broader spectrum IV antibiotics.   Assessment & Plan:   Principal Problem:   SIRS (systemic inflammatory response syndrome) (HCC) Active Problems:   Essential hypertension   Anemia   Breast cancer (HCC)   Hyponatremia   Acute bacterial sinusitis  1-SIRS;  Patient continued to have fever, tachycardia. Chest x-ray negative. Initial blood cultures 03-13-2018 ; negative to date. change Augmentin to vancomycin and cefepime. Patient immunocompromised, getting chemo.  Viral respiratory panel.negative pro calcitonin level. 0.45 folow Repeated blood cultures 11-20, no growth to date, will stop vancomycin.  If fever persist, might need CT sinus, chest   2-Hyponatremia; improved.  continue with IV fluids.  3- Hypokalemia; replete orally.   4-Anemia; related to chemotherapy. Some concern with hemodilution. Repeat hemoglobin in the morning and if less than 7 worsening blood transfusion. Transfuse 2 units PRBC>  Hb improved after transfusion.   5-Hypertension; continue with labetalol.  6-Breast cancer, with metastasis to bone diagnosed May 2019. Patient undergoing treatment with Taxol and carboplatin, last infusion 03/03/2018. Follow with Dr. Learta Codding  RN Pressure Injury Documentation:    Malnutrition Type:      Malnutrition Characteristics:      Nutrition Interventions:     Estimated body mass index is 28.74 kg/m as calculated from the following:   Height as of this encounter: 5\' 3"  (1.6  m).   Weight as of this encounter: 73.6 kg.   DVT prophylaxis: Hold lovenox due to anemia Code Status: full code Family Communication: care discussed with patient. Disposition Plan: remaining in the hospital with IV antibiotics, patient is still spiking fevers.  Consultants:   none   Procedures:  none   Antimicrobials:   Vancomycin 03/15/2018  Cefepime 03/15/2018   Subjective: She report sinus headaches.    Objective: Vitals:   03/16/18 2344 03/17/18 0328 03/17/18 1013 03/17/18 1244  BP: 134/74 137/80 (!) 154/87 131/81  Pulse: 94 (!) 101 87 (!) 101  Resp: 18 18 (!) 22 (!) 22  Temp: 98.7 F (37.1 C) 99.4 F (37.4 C) 99.6 F (37.6 C) 99 F (37.2 C)  TempSrc: Oral Oral Oral Oral  SpO2: 97% 98% 94% 97%  Weight:      Height:        Intake/Output Summary (Last 24 hours) at 03/17/2018 1642 Last data filed at 03/17/2018 1230 Gross per 24 hour  Intake 1804 ml  Output -  Net 1804 ml   Filed Weights   03/13/18 1733 03/13/18 2224  Weight: 74.4 kg 73.6 kg    Examination:  General exam: NAD Respiratory system: CTA Cardiovascular system: S 1, S 2 RRR Gastrointestinal system; BS present, soft, nt Central nervous system: Non focal.  Extremities: no edema Skin: no rashes.   Data Reviewed: I have personally reviewed following labs and imaging studies  CBC: Recent Labs  Lab 03/13/18 1802 03/14/18 1010 03/15/18 0432 03/16/18 0414 03/17/18 0939  WBC 7.5 7.7 7.4 8.4 9.8  NEUTROABS 5.3 5.3 5.2  --   --   HGB 8.3* 7.4* 7.0* 6.5* 9.5*  HCT  26.0* 23.4* 22.2* 21.0* 29.2*  MCV 97.7 100.4* 96.9 99.1 90.4  PLT 251 214 216 182 161   Basic Metabolic Panel: Recent Labs  Lab 03/13/18 1802 03/14/18 0641 03/14/18 1010 03/15/18 0432 03/16/18 0414 03/17/18 0939  NA 132* 134*  --  132* 138 135  K 3.5 3.4*  --  3.2* 3.3* 3.1*  CL 97* 102  --  101 107 100  CO2 22 22  --  22 22 23   GLUCOSE 124* 98  --  97 114* 105*  BUN 17 12  --  8 9 7   CREATININE 0.83 0.69   --  0.58 0.66 0.64  CALCIUM 9.3 8.7*  --  8.8* 8.9 9.2  MG  --   --  1.9  --   --   --    GFR: Estimated Creatinine Clearance: 76.4 mL/min (by C-G formula based on SCr of 0.64 mg/dL). Liver Function Tests: Recent Labs  Lab 03/13/18 1802  AST 39  ALT 33  ALKPHOS 162*  BILITOT 0.9  PROT 8.1  ALBUMIN 4.0   No results for input(s): LIPASE, AMYLASE in the last 168 hours. No results for input(s): AMMONIA in the last 168 hours. Coagulation Profile: Recent Labs  Lab 03/13/18 1802 03/15/18 1229  INR 1.30 1.31   Cardiac Enzymes: No results for input(s): CKTOTAL, CKMB, CKMBINDEX, TROPONINI in the last 168 hours. BNP (last 3 results) No results for input(s): PROBNP in the last 8760 hours. HbA1C: No results for input(s): HGBA1C in the last 72 hours. CBG: No results for input(s): GLUCAP in the last 168 hours. Lipid Profile: No results for input(s): CHOL, HDL, LDLCALC, TRIG, CHOLHDL, LDLDIRECT in the last 72 hours. Thyroid Function Tests: No results for input(s): TSH, T4TOTAL, FREET4, T3FREE, THYROIDAB in the last 72 hours. Anemia Panel: Recent Labs    03/15/18 0432  VITAMINB12 997*  FOLATE 13.9  FERRITIN 1,835*  TIBC 197*  IRON 29   Sepsis Labs: Recent Labs  Lab 03/13/18 1826 03/15/18 1229  PROCALCITON  --  0.45  LATICACIDVEN 1.13  --     Recent Results (from the past 240 hour(s))  Culture, blood (Routine x 2)     Status: None (Preliminary result)   Collection Time: 03/13/18  6:02 PM  Result Value Ref Range Status   Specimen Description   Final    BLOOD BLOOD LEFT HAND Performed at [facility] [address], [place], [place] [address]    Special Requests   Final    BOTTLES DRAWN AEROBIC AND ANAEROBIC Blood Culture results may not be optimal due to an inadequate volume of blood received in culture bottles Performed at [facility] [address], [place], [place] [address]    Culture   Final    NO GROWTH 4  DAYS Performed at [facility], [place] [address] [place], [place] [address]    Report Status PENDING  Incomplete  Culture, blood (Routine x 2)     Status: None (Preliminary result)   Collection Time: 03/13/18  6:02 PM  Result Value Ref Range Status   Specimen Description   Final    BLOOD PORTA CATH Performed at [facility] [address] [place], [place] [address]    Special Requests   Final    BOTTLES DRAWN AEROBIC AND ANAEROBIC Blood Culture adequate volume Performed at [facility] [address] [place],  [address]    Culture   Final  NO GROWTH 4 DAYS Performed at [facility], [place] [address], [place], [place] [address]    Report Status PENDING  Incomplete  Urine culture     Status: Abnormal   Collection Time: 03/14/18  4:51 AM  Result Value Ref Range Status   Specimen Description   Final    URINE, CLEAN CATCH Performed at [facility], [place] [address] [place], [place] [address]    Special Requests   Final    NONE Performed at [facility], [place] [address], [place], [place] [address]    Culture (A)  Final    <10,000 COLONIES/mL INSIGNIFICANT GROWTH Performed at [facility] [address], [place], [place] [address]    Report Status 03/15/2018 FINAL  Final  Respiratory Panel by PCR     Status: None   Collection Time: 03/15/18 10:19 AM  Result Value Ref Range Status   Adenovirus NOT DETECTED NOT DETECTED Final   Coronavirus 229E NOT DETECTED NOT DETECTED Final   Coronavirus HKU1 NOT DETECTED NOT DETECTED Final   Coronavirus NL63 NOT DETECTED NOT DETECTED Final   Coronavirus OC43 NOT DETECTED NOT DETECTED Final   Metapneumovirus NOT DETECTED NOT DETECTED Final   Rhinovirus / Enterovirus NOT DETECTED NOT DETECTED Final   Influenza A NOT DETECTED NOT DETECTED Final   Influenza B NOT DETECTED NOT DETECTED Final   Parainfluenza Virus 1 NOT DETECTED NOT DETECTED  Final   Parainfluenza Virus 2 NOT DETECTED NOT DETECTED Final   Parainfluenza Virus 3 NOT DETECTED NOT DETECTED Final   Parainfluenza Virus 4 NOT DETECTED NOT DETECTED Final   Respiratory Syncytial Virus NOT DETECTED NOT DETECTED Final   Bordetella pertussis NOT DETECTED NOT DETECTED Final   Chlamydophila pneumoniae NOT DETECTED NOT DETECTED Final   Mycoplasma pneumoniae NOT DETECTED NOT DETECTED Final    Comment: Performed at [facility], [place] [address] [place], [place] [address]  Culture, blood (routine x 2)     Status: None (Preliminary result)   Collection Time: 03/15/18  5:13 PM  Result Value Ref Range Status   Specimen Description   Final    BLOOD RIGHT ARM Performed at [facility] [address] [place], [place] [address]    Special Requests   Final    BOTTLES DRAWN AEROBIC AND ANAEROBIC Blood Culture adequate volume Performed at [facility] [address] [place], [place] [address]    Culture   Final    NO GROWTH 2 DAYS Performed at [facility] [address], [place], [place] [address]    Report Status PENDING  Incomplete  Culture, blood (routine x 2)     Status: None (Preliminary result)   Collection Time: 03/15/18  5:13 PM  Result Value Ref Range Status   Specimen Description BLOOD BLOOD RIGHT FOREARM  Final   Special Requests   Final    BOTTLES DRAWN AEROBIC AND ANAEROBIC Blood Culture adequate volume Performed at [facility] [address] [place], [place] [address]    Culture   Final    NO GROWTH 2 DAYS Performed at [facility] [address], [place],  [address]    Report Status PENDING  Incomplete         Radiology Studies: No results found.      Scheduled Meds: . labetalol  300 mg Oral BID  . loratadine  10 mg Oral Daily   Continuous Infusions: .  ceFEPime (MAXIPIME) IV 1 g (03/17/18 1256)  . metronidazole 500 mg (03/17/18 1433)     LOS: 3 days     Time spent: 35 minutes    Elmarie Shiley, MD [facility] Pager [phone]  If 7PM-7AM, please contact night-coverage [email or web address] Password [facility] 03/17/2018, 4:42 PM

## 2018-03-18 LAB — CULTURE, BLOOD (ROUTINE X 2)
CULTURE: NO GROWTH
CULTURE: NO GROWTH
SPECIAL REQUESTS: ADEQUATE

## 2018-03-18 MED ORDER — LORATADINE 10 MG PO TABS: 10.0000 mg | ORAL_TABLET | Freq: Every day | ORAL | 0 refills | Status: AC

## 2018-03-18 MED ORDER — AMOXICILLIN-POT CLAVULANATE 875-125 MG PO TABS: 1.0000 | ORAL_TABLET | Freq: Two times a day (BID) | ORAL | 0 refills | Status: AC

## 2018-03-18 MED ORDER — HEPARIN SOD (PORK) LOCK FLUSH 100 UNIT/ML IV SOLN
500.0000 [IU] | INTRAVENOUS | Status: AC | PRN
  Administered 2018-03-18: 500 [IU]

## 2018-03-18 MED ORDER — ACETAMINOPHEN 325 MG PO TABS: 650.0000 mg | ORAL_TABLET | Freq: Four times a day (QID) | ORAL | 0 refills | Status: AC | PRN

## 2018-03-18 NOTE — Discharge Summary (Signed)
Physician Discharge Summary  Olivia Patel IRW:[id number] DOB: [birth year] DOA: 03/13/2018  PCP: Lance Sell, NP  Admit date: 03/13/2018 Discharge date: 03/18/2018  Admitted From: Home Disposition: Home  Recommendations for Outpatient Follow-up:  1. Follow up with PCP in 1-2 weeks 2. Please obtain BMP/CBC in one week   Home Health: None Discharge Condition: stable CODE STATUS: Full code Diet recommendation: Heart Healthy    Brief/Interim Summary: Brief Narrative: 57 year old with PMH significant for breast cancer with osseous mets presents to ED with fever, sinus congestion, right eye pain. Chest x ray negative for infection. Influenza PCR negative. Patient was admitted, started on IV fluids, Augmentin for presumed sinusitis.  Patient continued to have fevers, and SIRS  Criteria. Patient will be a started on broader spectrum IV antibiotics.   Assessment & Plan:   Principal Problem:   SIRS (systemic inflammatory response syndrome) (HCC) Active Problems:   Essential hypertension   Anemia   Breast cancer (HCC)   Hyponatremia   Acute bacterial sinusitis  1-SIRS;  Patient continued to have fever, tachycardia. Chest x-ray negative. Initial blood cultures 03-13-2018 ; negative to date. change Augmentin to vancomycin and cefepime. Patient immunocompromised, getting chemo.  Viral respiratory panel.negative pro calcitonin level. 0.45 folow Repeated blood cultures 11-20, no growth to date, will stop vancomycin.  Presumed secondary to sinusitis.  She is now afebrile.  Will be discharged on Augmentin.  2-Hyponatremia; improved.  continue with IV fluids.  3- Hypokalemia; replete orally.   4-Anemia; related to chemotherapy. Some concern with hemodilution. Repeat hemoglobin in the morning and if less than 7 worsening blood transfusion. Transfuse 2 units PRBC>  Hb improved after transfusion.   5-Hypertension; continue with labetalol.  6-Breast cancer, with  metastasis to bone diagnosed May 2019. Patient undergoing treatment with Taxol and carboplatin, last infusion 03/03/2018. Follow with Dr. Learta Codding   Discharge Diagnoses:  Principal Problem:   SIRS (systemic inflammatory response syndrome) ([place] [facility]) Active Problems:   Essential hypertension   Anemia   Breast cancer ([place])   Hyponatremia   Acute bacterial sinusitis    Discharge Instructions  Discharge Instructions    Diet - low sodium heart healthy   Complete by:  As directed    Increase activity slowly   Complete by:  As directed      Allergies as of 03/18/2018   No Known Allergies     Medication List    STOP taking these medications   DAYQUIL PO     TAKE these medications   acetaminophen 325 MG tablet Commonly known as:  TYLENOL Take 2 tablets (650 mg total) by mouth every 6 (six) hours as needed for mild pain (or Fever >/= 101).   amoxicillin-clavulanate 875-125 MG tablet Commonly known as:  AUGMENTIN Take 1 tablet by mouth 2 (two) times daily for 2 days.   COUGH DROPS 10 MG Lozg Generic drug:  Menthol Use as directed 1 lozenge in the mouth or throat every 2 (two) hours as needed (cough).   fluticasone 50 MCG/ACT nasal spray Commonly known as:  FLONASE Place 2 sprays into both nostrils daily.   ibuprofen 200 MG tablet Commonly known as:  ADVIL,MOTRIN Take 400 mg by mouth every 6 (six) hours as needed.   ICY HOT BACK EX Apply 1 application topically daily as needed (arthritis pain).   labetalol 300 MG tablet Commonly known as:  NORMODYNE Take 1 tablet (300 mg total) by mouth 2 (two) times daily.   lidocaine-prilocaine cream Commonly known as:  EMLA  Apply to portacath site 1 hour prior to use   loratadine 10 MG tablet Commonly known as:  CLARITIN Take 1 tablet (10 mg total) by mouth daily. Start taking on:  03/19/2018   prochlorperazine 10 MG tablet Commonly known as:  COMPAZINE Take 1 tablet (10 mg total) by mouth every 6 (six) hours as needed for  nausea or vomiting.   senna-docusate 8.6-50 MG tablet Commonly known as:  Senokot-S Take 1 tablet by mouth at bedtime as needed for mild constipation.       No Known Allergies  Consultations:  None   Procedures/Studies: Dg Chest 2 View  Result Date: 03/13/2018 CLINICAL DATA:  Cough and fever EXAM: CHEST - 2 VIEW COMPARISON:  CT chest 12/22/2017 and CXR 03/05/2018 FINDINGS: Bilateral healing rib fractures are noted accounting for areas of subtle opacity the left lower lobe and right mid lung. No overt pulmonary edema, effusion or pneumothorax. Heart size and mediastinal contours are stable and within normal limits. Port catheter tip terminates at the cavoatrial junction without complicating features IMPRESSION: Healing bilateral rib fractures are believed to account for subtle opacities projecting over the right mid lung and left lung base. No active pulmonary disease. Electronically Signed   By: Ashley Royalty M.D.   On: 03/13/2018 18:13   Dg Chest 2 View  Result Date: 03/05/2018 CLINICAL DATA:  57 year old female with history of stuffy nose, fever, sore throat and nonproductive cough. EXAM: CHEST - 2 VIEW COMPARISON:  Chest x-ray 09/24/2017. FINDINGS: Lung volumes are normal. No consolidative airspace disease. No pleural effusions. No pneumothorax. No pulmonary nodule or mass noted. Pulmonary vasculature and the cardiomediastinal silhouette are within normal limits. Multiple old healed bilateral rib fractures. Left internal jugular single-lumen power porta cath with tip terminating at the superior cavoatrial junction. IMPRESSION: 1. No radiographic evidence of acute cardiopulmonary disease. Electronically Signed   By: Vinnie Langton M.D.   On: 03/05/2018 12:32     Subjective: Feeling better headache improved.  Discharge Exam: Vitals:   03/18/18 0207 03/18/18 0621  BP: (!) 148/92 (!) 138/91  Pulse: 94 (!) 101  Resp: 16 17  Temp: 99.5 F (37.5 C) 98.6 F (37 C)  SpO2: 95% 95%    Vitals:   03/17/18 1809 03/17/18 2049 03/18/18 0207 03/18/18 0621  BP: (!) 145/89 (!) 142/92 (!) 148/92 (!) 138/91  Pulse: (!) 103 (!) 109 94 (!) 101  Resp: 18 16 16 17   Temp: 99.3 F (37.4 C) 99.7 F (37.6 C) 99.5 F (37.5 C) 98.6 F (37 C)  TempSrc: Oral Oral Oral Oral  SpO2: 97% 95% 95% 95%  Weight:      Height:        General: Pt is alert, awake, not in acute distress Cardiovascular: RRR, S1/S2 +, no rubs, no gallops Respiratory: CTA bilaterally, no wheezing, no rhonchi Abdominal: Soft, NT, ND, bowel sounds + Extremities: no edema, no cyanosis    The results of significant diagnostics from this hospitalization (including imaging, microbiology, ancillary and laboratory) are listed below for reference.     Microbiology: Recent Results (from the past 240 hour(s))  Culture, blood (Routine x 2)     Status: None (Preliminary result)   Collection Time: 03/13/18  6:02 PM  Result Value Ref Range Status   Specimen Description   Final    BLOOD BLOOD LEFT HAND Performed at [facility], [place] [address] [place], [place] [address]    Special Requests   Final    BOTTLES DRAWN  AEROBIC AND ANAEROBIC Blood Culture results may not be optimal due to an inadequate volume of blood received in culture bottles Performed at [facility], [place] [address] [place], [place] [address]    Culture   Final    NO GROWTH 4 DAYS Performed at [facility], [place] [address], [place], [place] [address]    Report Status PENDING  Incomplete  Culture, blood (Routine x 2)     Status: None (Preliminary result)   Collection Time: 03/13/18  6:02 PM  Result Value Ref Range Status   Specimen Description   Final    BLOOD PORTA CATH Performed at [facility] [address] [place], [place] [address]    Special Requests   Final    BOTTLES DRAWN AEROBIC AND ANAEROBIC Blood Culture adequate volume Performed at [place] [address], [place], [place] [address]    Culture   Final    NO GROWTH 4 DAYS Performed at [facility], [place] [address], [place], [place] [address]    Report Status PENDING  Incomplete  Urine culture     Status: Abnormal   Collection Time: 03/14/18  4:51 AM  Result Value Ref Range Status   Specimen Description   Final    URINE, CLEAN CATCH Performed at [facility], [place] [address] [place], [place] [address]    Special Requests   Final    NONE Performed at [facility], [place] [address], [place], [place] [address]    Culture (A)  Final    <10,000 COLONIES/mL INSIGNIFICANT GROWTH Performed at [place] [address], [place], [place] [address]    Report Status 03/15/2018 FINAL  Final  Respiratory Panel by PCR     Status: None   Collection Time: 03/15/18 10:19 AM  Result Value Ref Range Status   Adenovirus NOT DETECTED NOT DETECTED Final   Coronavirus 229E NOT DETECTED NOT DETECTED Final   Coronavirus HKU1 NOT DETECTED NOT DETECTED Final   Coronavirus NL63 NOT DETECTED NOT DETECTED Final   Coronavirus OC43 NOT DETECTED NOT DETECTED Final   Metapneumovirus NOT DETECTED NOT DETECTED Final   Rhinovirus / Enterovirus NOT DETECTED NOT DETECTED Final   Influenza A NOT DETECTED NOT DETECTED Final   Influenza B NOT DETECTED NOT DETECTED Final   Parainfluenza Virus 1 NOT DETECTED NOT DETECTED Final   Parainfluenza Virus 2 NOT DETECTED NOT DETECTED Final   Parainfluenza Virus 3 NOT DETECTED NOT DETECTED Final   Parainfluenza Virus 4 NOT DETECTED NOT DETECTED Final   Respiratory Syncytial Virus NOT DETECTED NOT DETECTED Final   Bordetella pertussis NOT DETECTED NOT DETECTED Final   Chlamydophila pneumoniae NOT DETECTED NOT DETECTED Final   Mycoplasma pneumoniae NOT DETECTED NOT DETECTED Final    Comment: Performed at [facility], [place] [address], [place], [place] [address]  Culture, blood (routine x 2)     Status: None  (Preliminary result)   Collection Time: 03/15/18  5:13 PM  Result Value Ref Range Status   Specimen Description   Final    BLOOD RIGHT ARM Performed at [facility] [address] [place], [place] [address]    Special Requests   Final    BOTTLES DRAWN AEROBIC AND ANAEROBIC Blood Culture adequate volume Performed at [place] [address] [place],  [address]    Culture   Final    NO GROWTH  2 DAYS Performed at [facility], [place] [address], [place], [place] [address]    Report Status PENDING  Incomplete  Culture, blood (routine x 2)     Status: None (Preliminary result)   Collection Time: 03/15/18  5:13 PM  Result Value Ref Range Status   Specimen Description BLOOD BLOOD RIGHT FOREARM  Final   Special Requests   Final    BOTTLES DRAWN AEROBIC AND ANAEROBIC Blood Culture adequate volume Performed at [place] [address], [place], [place] [address]    Culture   Final    NO GROWTH 2 DAYS Performed at [place] [address], [place], [place] [address]    Report Status PENDING  Incomplete     Labs: BNP (last 3 results) No results for input(s): BNP in the last 8760 hours. Basic Metabolic Panel: Recent Labs  Lab 03/13/18 1802 03/14/18 0641 03/14/18 1010 03/15/18 0432 03/16/18 0414 03/17/18 0939  NA 132* 134*  --  132* 138 135  K 3.5 3.4*  --  3.2* 3.3* 3.1*  CL 97* 102  --  101 107 100  CO2 22 22  --  22 22 23   GLUCOSE 124* 98  --  97 114* 105*  BUN 17 12  --  8 9 7   CREATININE 0.83 0.69  --  0.58 0.66 0.64  CALCIUM 9.3 8.7*  --  8.8* 8.9 9.2  MG  --   --  1.9  --   --   --    Liver Function Tests: Recent Labs  Lab 03/13/18 1802  AST 39  ALT 33  ALKPHOS 162*  BILITOT 0.9  PROT 8.1  ALBUMIN 4.0   No results for input(s): LIPASE, AMYLASE in the last 168 hours. No results for input(s): AMMONIA in the last 168 hours. CBC: Recent Labs  Lab 03/13/18 1802  03/14/18 1010 03/15/18 0432 03/16/18 0414 03/17/18 0939  WBC 7.5 7.7 7.4 8.4 9.8  NEUTROABS 5.3 5.3 5.2  --   --   HGB 8.3* 7.4* 7.0* 6.5* 9.5*  HCT 26.0* 23.4* 22.2* 21.0* 29.2*  MCV 97.7 100.4* 96.9 99.1 90.4  PLT 251 214 216 182 189   Cardiac Enzymes: No results for input(s): CKTOTAL, CKMB, CKMBINDEX, TROPONINI in the last 168 hours. BNP: Invalid input(s): POCBNP CBG: No results for input(s): GLUCAP in the last 168 hours. D-Dimer No results for input(s): DDIMER in the last 72 hours. Hgb A1c No results for input(s): HGBA1C in the last 72 hours. Lipid Profile No results for input(s): CHOL, HDL, LDLCALC, TRIG, CHOLHDL, LDLDIRECT in the last 72 hours. Thyroid function studies No results for input(s): TSH, T4TOTAL, T3FREE, THYROIDAB in the last 72 hours.  Invalid input(s): FREET3 Anemia work up No results for input(s): VITAMINB12, FOLATE, FERRITIN, TIBC, IRON, RETICCTPCT in the last 72 hours. Urinalysis    Component Value Date/Time   COLORURINE YELLOW 03/14/2018 0451   APPEARANCEUR CLEAR 03/14/2018 0451   LABSPEC 1.015 03/14/2018 0451   PHURINE 6.0 03/14/2018 0451   GLUCOSEU NEGATIVE 03/14/2018 0451   HGBUR NEGATIVE 03/14/2018 0451   BILIRUBINUR NEGATIVE 03/14/2018 0451   KETONESUR 20 (A) 03/14/2018 0451   PROTEINUR 30 (A) 03/14/2018 0451   NITRITE NEGATIVE 03/14/2018 0451   LEUKOCYTESUR NEGATIVE 03/14/2018 0451   Sepsis Labs Invalid input(s): PROCALCITONIN,  WBC,  LACTICIDVEN Microbiology Recent Results (from the past 240 hour(s))  Culture, blood (Routine x 2)     Status: None (Preliminary result)   Collection Time:  03/13/18  6:02 PM  Result Value Ref Range Status   Specimen Description   Final    BLOOD BLOOD LEFT HAND Performed at [facility] [address], [place], [place] [address]    Special Requests   Final    BOTTLES DRAWN AEROBIC AND ANAEROBIC Blood Culture results may not be optimal due to an inadequate volume of blood received in  culture bottles Performed at [facility] [facility] [address], [place], [place] [address]    Culture   Final    NO GROWTH 4 DAYS Performed at [facility], [place] [address] [place], [place] [address]    Report Status PENDING  Incomplete  Culture, blood (Routine x 2)     Status: None (Preliminary result)   Collection Time: 03/13/18  6:02 PM  Result Value Ref Range Status   Specimen Description   Final    BLOOD PORTA CATH Performed at [facility] [address] [place], [place] [address]    Special Requests   Final    BOTTLES DRAWN AEROBIC AND ANAEROBIC Blood Culture adequate volume Performed at [facility] [address], [place], [place] [address]    Culture   Final    NO GROWTH 4 DAYS Performed at [facility], [place] [address] [place], [place] [address]    Report Status PENDING  Incomplete  Urine culture     Status: Abnormal   Collection Time: 03/14/18  4:51 AM  Result Value Ref Range Status   Specimen Description   Final    URINE, CLEAN CATCH Performed at [facility], [place] [address] [place], [place] [address]    Special Requests   Final    NONE Performed at [facility], [place] [address] [place] [address], [place], [place] [address]    Culture (A)  Final    <10,000 COLONIES/mL INSIGNIFICANT GROWTH Performed at [place] [address], [place],  [address]    Report Status 03/15/2018 FINAL  Final  Respiratory Panel by PCR     Status: None   Collection Time: 03/15/18 10:19 AM  Result Value Ref Range Status   Adenovirus NOT DETECTED NOT DETECTED Final   Coronavirus 229E NOT DETECTED NOT DETECTED Final   Coronavirus HKU1 NOT DETECTED NOT DETECTED Final   Coronavirus NL63 NOT DETECTED NOT DETECTED Final   Coronavirus OC43 NOT DETECTED NOT DETECTED Final   Metapneumovirus NOT DETECTED NOT DETECTED Final   Rhinovirus / Enterovirus NOT DETECTED NOT DETECTED Final    Influenza A NOT DETECTED NOT DETECTED Final   Influenza B NOT DETECTED NOT DETECTED Final   Parainfluenza Virus 1 NOT DETECTED NOT DETECTED Final   Parainfluenza Virus 2 NOT DETECTED NOT DETECTED Final   Parainfluenza Virus 3 NOT DETECTED NOT DETECTED Final   Parainfluenza Virus 4 NOT DETECTED NOT DETECTED Final   Respiratory Syncytial Virus NOT DETECTED NOT DETECTED Final   Bordetella pertussis NOT DETECTED NOT DETECTED Final   Chlamydophila pneumoniae NOT DETECTED NOT DETECTED Final   Mycoplasma pneumoniae NOT DETECTED NOT DETECTED Final    Comment: Performed at [facility], [place] [address], [place],  [address]  Culture, blood (routine x 2)     Status: None (Preliminary result)   Collection Time: 03/15/18  5:13 PM  Result Value Ref Range Status   Specimen Description   Final    BLOOD RIGHT ARM Performed at [place]  [address], [place], [place] [address]    Special Requests   Final    BOTTLES DRAWN AEROBIC AND ANAEROBIC Blood Culture adequate volume Performed at [facility] [address], [place], [place] [address]    Culture   Final    NO GROWTH 2 DAYS Performed at [place] [address], [place], [place] [address]    Report Status PENDING  Incomplete  Culture, blood (routine x 2)     Status: None (Preliminary result)   Collection Time: 03/15/18  5:13 PM  Result Value Ref Range Status   Specimen Description BLOOD BLOOD RIGHT FOREARM  Final   Special Requests   Final    BOTTLES DRAWN AEROBIC AND ANAEROBIC Blood Culture adequate volume Performed at [place] [address] [place], [place] [address]    Culture   Final    NO GROWTH 2 DAYS Performed at [place] [address] [place], [place] [address]    Report Status PENDING  Incomplete     Time coordinating discharge: 35 minutes  SIGNED:   Elmarie Shiley, MD  [facility] 03/18/2018, 10:00  AM Pager   If 7PM-7AM, please contact night-coverage [email or web address] Password [email or web address]

## 2018-03-18 NOTE — Progress Notes (Signed)
Pharmacy Antibiotic Note  Yannely Kintzel is a 57 y.o. female admitted on 03/13/2018 with SIRS, possible sinusitis.  Pharmacy has been consulted for cefepime dosing.  Today, 03/18/18   WBC (11/22) - WNL  SCr 0.6, CrCl ~75 mL/min  Afebrile  PCT (11/20) 0.45  Cultures: no growth to date  This is day #4 of IV antibiotics.  Plan:  Continue cefepime 1 g IV q8h  Continue to monitor renal function and culture data  Height: 5\' 3"  (160 cm) Weight: 162 lb 4.1 oz (73.6 kg) IBW/kg (Calculated) : 52.4  Temp (24hrs), Avg:99.2 F (37.3 C), Min:98.6 F (37 C), Max:99.7 F (37.6 C)  Recent Labs  Lab 03/13/18 1802 03/13/18 1826 03/14/18 0641 03/14/18 1010 03/15/18 0432 03/16/18 0414 03/17/18 0939  WBC 7.5  --   --  7.7 7.4 8.4 9.8  CREATININE 0.83  --  0.69  --  0.58 0.66 0.64  LATICACIDVEN  --  1.13  --   --   --   --   --     Estimated Creatinine Clearance: 76.4 mL/min (by C-G formula based on SCr of 0.64 mg/dL).    No Known Allergies  Antimicrobials this admission:  11/18 Augmentin >> 11/20 11/20 Vancomycin >> 11/22 11/20 Cefepime >> 11/20 Flagyl >> 11/22  Dose adjustments this admission:   Microbiology results:  11/18 BCx: ngtd 11/20 Blood: ngtd 11/18 Influenza panel: neg 11/19 UCx: <10k insignificant growth 11/20 Respiratory panel: nothing detected  Thank you for allowing pharmacy to be a part of this patient's care.  Lenis Noon, PharmD 03/18/18 10:47 AM

## 2018-03-20 ENCOUNTER — Telehealth: Payer: Self-pay | Admitting: *Deleted

## 2018-03-20 LAB — CULTURE, BLOOD (ROUTINE X 2)
Culture: NO GROWTH
Culture: NO GROWTH
Special Requests: ADEQUATE
Special Requests: ADEQUATE

## 2018-03-20 NOTE — Telephone Encounter (Signed)
Called pt to make hosp follow-up appt. Pt states she will call back tomorrow she has to find out when her daughter is off work to bring her. Inform her she can call back and ask to apeak w/..(sent CRM to [facility] for FYI).Marland KitchenJohny Chess

## 2018-03-21 NOTE — Telephone Encounter (Signed)
Transition Care Management Follow-up Telephone Call   Date discharged? 03/18/18   How have you been since you were released from the hospital? Pt states she is feeling ok   Do you understand why you were in the hospital? YES   Do you understand the discharge instructions? YES   Where were you discharged to? Home   Items Reviewed:  Medications reviewed: YES  Allergies reviewed: YES  Dietary changes reviewed: YES  Referrals reviewed: No referral needed   Functional Questionnaire:   Activities of Daily Living (ADLs):   She states she are independent in the following: ambulation, bathing and hygiene, feeding, continence, grooming, toileting and dressing States she doesn't require assistance   Any transportation issues/concerns?: YES, but she states she will have one of her daughters to bring her   Any patient concerns? NO   Confirmed importance and date/time of follow-up visits scheduled YES, appt Dec 2nd  Provider Appointment booked with Caesar Chestnut  Confirmed with patient if condition begins to worsen call PCP or go to the ER.  Patient was given the office number and encouraged to call back with question or concerns.  : YES

## 2018-03-24 ENCOUNTER — Encounter (HOSPITAL_COMMUNITY): Payer: Self-pay

## 2018-03-24 ENCOUNTER — Other Ambulatory Visit: Payer: Self-pay

## 2018-03-24 ENCOUNTER — Inpatient Hospital Stay (HOSPITAL_COMMUNITY)
Admission: EM | Admit: 2018-03-24 | Discharge: 2018-04-26 | DRG: 097 | Disposition: E | Payer: Medicare Other | Attending: Internal Medicine | Admitting: Internal Medicine

## 2018-03-24 ENCOUNTER — Emergency Department (HOSPITAL_COMMUNITY): Payer: Medicare Other

## 2018-03-24 ENCOUNTER — Ambulatory Visit: Payer: Self-pay

## 2018-03-24 DIAGNOSIS — Z841 Family history of disorders of kidney and ureter: Secondary | ICD-10-CM

## 2018-03-24 DIAGNOSIS — E876 Hypokalemia: Secondary | ICD-10-CM | POA: Diagnosis not present

## 2018-03-24 DIAGNOSIS — N6310 Unspecified lump in the right breast, unspecified quadrant: Secondary | ICD-10-CM | POA: Diagnosis present

## 2018-03-24 DIAGNOSIS — J90 Pleural effusion, not elsewhere classified: Secondary | ICD-10-CM | POA: Diagnosis present

## 2018-03-24 DIAGNOSIS — G40901 Epilepsy, unspecified, not intractable, with status epilepticus: Secondary | ICD-10-CM | POA: Diagnosis not present

## 2018-03-24 DIAGNOSIS — E860 Dehydration: Secondary | ICD-10-CM | POA: Diagnosis not present

## 2018-03-24 DIAGNOSIS — R4701 Aphasia: Secondary | ICD-10-CM | POA: Diagnosis present

## 2018-03-24 DIAGNOSIS — G8191 Hemiplegia, unspecified affecting right dominant side: Secondary | ICD-10-CM | POA: Diagnosis present

## 2018-03-24 DIAGNOSIS — R063 Periodic breathing: Secondary | ICD-10-CM

## 2018-03-24 DIAGNOSIS — I674 Hypertensive encephalopathy: Secondary | ICD-10-CM | POA: Diagnosis not present

## 2018-03-24 DIAGNOSIS — C787 Secondary malignant neoplasm of liver and intrahepatic bile duct: Secondary | ICD-10-CM | POA: Diagnosis not present

## 2018-03-24 DIAGNOSIS — G9340 Encephalopathy, unspecified: Secondary | ICD-10-CM

## 2018-03-24 DIAGNOSIS — C7931 Secondary malignant neoplasm of brain: Secondary | ICD-10-CM | POA: Diagnosis present

## 2018-03-24 DIAGNOSIS — D638 Anemia in other chronic diseases classified elsewhere: Secondary | ICD-10-CM | POA: Diagnosis not present

## 2018-03-24 DIAGNOSIS — C801 Malignant (primary) neoplasm, unspecified: Secondary | ICD-10-CM | POA: Diagnosis not present

## 2018-03-24 DIAGNOSIS — J9690 Respiratory failure, unspecified, unspecified whether with hypoxia or hypercapnia: Secondary | ICD-10-CM

## 2018-03-24 DIAGNOSIS — G9341 Metabolic encephalopathy: Secondary | ICD-10-CM | POA: Diagnosis not present

## 2018-03-24 DIAGNOSIS — Z9221 Personal history of antineoplastic chemotherapy: Secondary | ICD-10-CM | POA: Diagnosis not present

## 2018-03-24 DIAGNOSIS — E873 Alkalosis: Secondary | ICD-10-CM | POA: Diagnosis present

## 2018-03-24 DIAGNOSIS — C729 Malignant neoplasm of central nervous system, unspecified: Secondary | ICD-10-CM | POA: Diagnosis not present

## 2018-03-24 DIAGNOSIS — R5081 Fever presenting with conditions classified elsewhere: Secondary | ICD-10-CM | POA: Diagnosis not present

## 2018-03-24 DIAGNOSIS — R6510 Systemic inflammatory response syndrome (SIRS) of non-infectious origin without acute organ dysfunction: Secondary | ICD-10-CM | POA: Diagnosis not present

## 2018-03-24 DIAGNOSIS — R4182 Altered mental status, unspecified: Secondary | ICD-10-CM | POA: Diagnosis not present

## 2018-03-24 DIAGNOSIS — E871 Hypo-osmolality and hyponatremia: Secondary | ICD-10-CM | POA: Diagnosis present

## 2018-03-24 DIAGNOSIS — D72829 Elevated white blood cell count, unspecified: Secondary | ICD-10-CM | POA: Diagnosis not present

## 2018-03-24 DIAGNOSIS — R509 Fever, unspecified: Secondary | ICD-10-CM

## 2018-03-24 DIAGNOSIS — J9600 Acute respiratory failure, unspecified whether with hypoxia or hypercapnia: Secondary | ICD-10-CM | POA: Diagnosis not present

## 2018-03-24 DIAGNOSIS — D899 Disorder involving the immune mechanism, unspecified: Secondary | ICD-10-CM | POA: Diagnosis not present

## 2018-03-24 DIAGNOSIS — Z8249 Family history of ischemic heart disease and other diseases of the circulatory system: Secondary | ICD-10-CM

## 2018-03-24 DIAGNOSIS — Z79899 Other long term (current) drug therapy: Secondary | ICD-10-CM | POA: Diagnosis not present

## 2018-03-24 DIAGNOSIS — C50919 Malignant neoplasm of unspecified site of unspecified female breast: Secondary | ICD-10-CM | POA: Diagnosis present

## 2018-03-24 DIAGNOSIS — R531 Weakness: Secondary | ICD-10-CM | POA: Diagnosis not present

## 2018-03-24 DIAGNOSIS — Z9911 Dependence on respirator [ventilator] status: Secondary | ICD-10-CM | POA: Diagnosis not present

## 2018-03-24 DIAGNOSIS — D696 Thrombocytopenia, unspecified: Secondary | ICD-10-CM | POA: Diagnosis not present

## 2018-03-24 DIAGNOSIS — A419 Sepsis, unspecified organism: Secondary | ICD-10-CM | POA: Diagnosis present

## 2018-03-24 DIAGNOSIS — G40109 Localization-related (focal) (partial) symptomatic epilepsy and epileptic syndromes with simple partial seizures, not intractable, without status epilepticus: Secondary | ICD-10-CM | POA: Diagnosis present

## 2018-03-24 DIAGNOSIS — R569 Unspecified convulsions: Secondary | ICD-10-CM | POA: Diagnosis not present

## 2018-03-24 DIAGNOSIS — I63512 Cerebral infarction due to unspecified occlusion or stenosis of left middle cerebral artery: Secondary | ICD-10-CM | POA: Diagnosis present

## 2018-03-24 DIAGNOSIS — J984 Other disorders of lung: Secondary | ICD-10-CM | POA: Diagnosis not present

## 2018-03-24 DIAGNOSIS — N170 Acute kidney failure with tubular necrosis: Secondary | ICD-10-CM | POA: Diagnosis not present

## 2018-03-24 DIAGNOSIS — D649 Anemia, unspecified: Secondary | ICD-10-CM | POA: Diagnosis not present

## 2018-03-24 DIAGNOSIS — C7951 Secondary malignant neoplasm of bone: Secondary | ICD-10-CM | POA: Diagnosis present

## 2018-03-24 DIAGNOSIS — R0682 Tachypnea, not elsewhere classified: Secondary | ICD-10-CM

## 2018-03-24 DIAGNOSIS — I160 Hypertensive urgency: Secondary | ICD-10-CM | POA: Diagnosis not present

## 2018-03-24 DIAGNOSIS — I639 Cerebral infarction, unspecified: Secondary | ICD-10-CM

## 2018-03-24 DIAGNOSIS — I10 Essential (primary) hypertension: Secondary | ICD-10-CM | POA: Diagnosis not present

## 2018-03-24 DIAGNOSIS — A86 Unspecified viral encephalitis: Secondary | ICD-10-CM | POA: Diagnosis not present

## 2018-03-24 DIAGNOSIS — I6389 Other cerebral infarction: Secondary | ICD-10-CM | POA: Diagnosis not present

## 2018-03-24 DIAGNOSIS — R000 Tachycardia, unspecified: Secondary | ICD-10-CM | POA: Diagnosis not present

## 2018-03-24 DIAGNOSIS — Z66 Do not resuscitate: Secondary | ICD-10-CM | POA: Diagnosis not present

## 2018-03-24 DIAGNOSIS — Z171 Estrogen receptor negative status [ER-]: Secondary | ICD-10-CM | POA: Diagnosis not present

## 2018-03-24 DIAGNOSIS — D849 Immunodeficiency, unspecified: Secondary | ICD-10-CM | POA: Diagnosis not present

## 2018-03-24 DIAGNOSIS — Z95828 Presence of other vascular implants and grafts: Secondary | ICD-10-CM | POA: Diagnosis not present

## 2018-03-24 DIAGNOSIS — G038 Meningitis due to other specified causes: Secondary | ICD-10-CM | POA: Diagnosis not present

## 2018-03-24 DIAGNOSIS — Z515 Encounter for palliative care: Secondary | ICD-10-CM | POA: Diagnosis not present

## 2018-03-24 DIAGNOSIS — J9601 Acute respiratory failure with hypoxia: Secondary | ICD-10-CM | POA: Diagnosis not present

## 2018-03-24 DIAGNOSIS — C7949 Secondary malignant neoplasm of other parts of nervous system: Secondary | ICD-10-CM | POA: Diagnosis not present

## 2018-03-24 DIAGNOSIS — Z01818 Encounter for other preprocedural examination: Secondary | ICD-10-CM

## 2018-03-24 DIAGNOSIS — R410 Disorientation, unspecified: Secondary | ICD-10-CM | POA: Diagnosis not present

## 2018-03-24 DIAGNOSIS — Z833 Family history of diabetes mellitus: Secondary | ICD-10-CM

## 2018-03-24 DIAGNOSIS — C50911 Malignant neoplasm of unspecified site of right female breast: Secondary | ICD-10-CM | POA: Diagnosis not present

## 2018-03-24 DIAGNOSIS — I82409 Acute embolism and thrombosis of unspecified deep veins of unspecified lower extremity: Secondary | ICD-10-CM | POA: Diagnosis not present

## 2018-03-24 DIAGNOSIS — Z4682 Encounter for fitting and adjustment of non-vascular catheter: Secondary | ICD-10-CM | POA: Diagnosis not present

## 2018-03-24 DIAGNOSIS — R0602 Shortness of breath: Secondary | ICD-10-CM

## 2018-03-24 DIAGNOSIS — G40909 Epilepsy, unspecified, not intractable, without status epilepticus: Secondary | ICD-10-CM | POA: Diagnosis not present

## 2018-03-24 HISTORY — DX: Malignant (primary) neoplasm, unspecified: C80.1

## 2018-03-24 LAB — COMPREHENSIVE METABOLIC PANEL
ALT: 36 U/L (ref 0–44)
AST: 33 U/L (ref 15–41)
Albumin: 3.2 g/dL — ABNORMAL LOW (ref 3.5–5.0)
Alkaline Phosphatase: 195 U/L — ABNORMAL HIGH (ref 38–126)
Anion gap: 18 — ABNORMAL HIGH (ref 5–15)
BUN: 15 mg/dL (ref 6–20)
CO2: 27 mmol/L (ref 22–32)
CREATININE: 0.71 mg/dL (ref 0.44–1.00)
Calcium: 10.6 mg/dL — ABNORMAL HIGH (ref 8.9–10.3)
Chloride: 89 mmol/L — ABNORMAL LOW (ref 98–111)
GFR calc Af Amer: >60 mL/min (ref >60)
Glucose, Bld: 108 mg/dL — ABNORMAL HIGH (ref 70–99)
Potassium: 3.2 mmol/L — ABNORMAL LOW (ref 3.5–5.1)
Sodium: 134 mmol/L — ABNORMAL LOW (ref 135–145)
Total Bilirubin: 1.2 mg/dL (ref 0.3–1.2)
Total Protein: 8.1 g/dL (ref 6.5–8.1)

## 2018-03-24 LAB — CBC WITH DIFFERENTIAL/PLATELET
Abs Immature Granulocytes: 0.26 10*3/uL — ABNORMAL HIGH (ref 0.00–0.07)
Basophils Absolute: 0 10*3/uL (ref 0.0–0.1)
Basophils Relative: 0 %
EOS PCT: 0 %
Eosinophils Absolute: 0 10*3/uL (ref 0.0–0.5)
HEMATOCRIT: 31.1 % — AB (ref 36.0–46.0)
Hemoglobin: 10.2 g/dL — ABNORMAL LOW (ref 12.0–15.0)
Immature Granulocytes: 2 %
LYMPHS ABS: 1.4 10*3/uL (ref 0.7–4.0)
Lymphocytes Relative: 10 %
MCH: 30.3 pg (ref 26.0–34.0)
MCHC: 32.8 g/dL (ref 30.0–36.0)
MCV: 92.3 fL (ref 80.0–100.0)
MONO ABS: 0.9 10*3/uL (ref 0.1–1.0)
Monocytes Relative: 6 %
Neutro Abs: 11.7 10*3/uL — ABNORMAL HIGH (ref 1.7–7.7)
Neutrophils Relative %: 82 %
Platelets: 156 10*3/uL (ref 150–400)
RBC: 3.37 MIL/uL — AB (ref 3.87–5.11)
RDW: 18.3 % — ABNORMAL HIGH (ref 11.5–15.5)
WBC: 14.3 10*3/uL — AB (ref 4.0–10.5)
nRBC: 0 % (ref 0.0–0.2)

## 2018-03-24 LAB — MAGNESIUM: Magnesium: 1.7 mg/dL (ref 1.7–2.4)

## 2018-03-24 LAB — URINALYSIS, ROUTINE W REFLEX MICROSCOPIC
Bilirubin Urine: NEGATIVE
GLUCOSE, UA: NEGATIVE mg/dL
Hgb urine dipstick: NEGATIVE
Ketones, ur: 20 mg/dL — AB
Nitrite: NEGATIVE
PROTEIN: 30 mg/dL — AB
Specific Gravity, Urine: 1.023 (ref 1.005–1.030)
pH: 6 (ref 5.0–8.0)

## 2018-03-24 LAB — PROTIME-INR
INR: 1.31
Prothrombin Time: 16.2 seconds — ABNORMAL HIGH (ref 11.4–15.2)

## 2018-03-24 LAB — CG4 I-STAT (LACTIC ACID)
Lactic Acid, Venous: 1.51 mmol/L (ref 0.5–1.9)
Lactic Acid, Venous: 1.24 mmol/L (ref 0.5–1.9)

## 2018-03-24 LAB — TSH: TSH: 1.537 u[IU]/mL (ref 0.350–4.500)

## 2018-03-24 MED ORDER — LABETALOL HCL 200 MG PO TABS
300.0000 mg | ORAL_TABLET | Freq: Two times a day (BID) | ORAL | Status: DC
  Administered 2018-03-25 (×3): 300 mg via ORAL
  Filled 2018-03-24 (×5): qty 1

## 2018-03-24 MED ORDER — SODIUM CHLORIDE 0.9 % IV SOLN
1.0000 g | Freq: Three times a day (TID) | INTRAVENOUS | Status: DC
  Administered 2018-03-24 – 2018-03-29 (×14): 1 g via INTRAVENOUS
  Filled 2018-03-24 (×17): qty 1

## 2018-03-24 MED ORDER — SENNOSIDES-DOCUSATE SODIUM 8.6-50 MG PO TABS
1.0000 | ORAL_TABLET | Freq: Every evening | ORAL | Status: DC | PRN
  Administered 2018-03-24: 1 via ORAL
  Filled 2018-03-24: qty 1

## 2018-03-24 MED ORDER — LIDOCAINE-PRILOCAINE 2.5-2.5 % EX CREA: TOPICAL_CREAM | Freq: Two times a day (BID) | CUTANEOUS | Status: DC | PRN

## 2018-03-24 MED ORDER — SODIUM CHLORIDE 0.9 % IV SOLN
INTRAVENOUS | Status: DC | PRN
  Administered 2018-03-24 – 2018-03-25 (×2): via INTRAVENOUS

## 2018-03-24 MED ORDER — PROCHLORPERAZINE MALEATE 10 MG PO TABS: 10.0000 mg | ORAL_TABLET | Freq: Four times a day (QID) | ORAL | Status: DC | PRN

## 2018-03-24 MED ORDER — SODIUM CHLORIDE 0.9% FLUSH
10.0000 mL | INTRAVENOUS | Status: DC | PRN
  Administered 2018-03-24 – 2018-03-25 (×3): 10 mL
  Filled 2018-03-24 (×3): qty 40

## 2018-03-24 MED ORDER — POTASSIUM CHLORIDE 20 MEQ PO PACK
40.0000 meq | PACK | ORAL | Status: AC
  Administered 2018-03-25 (×2): 40 meq via ORAL
  Filled 2018-03-24 (×4): qty 2

## 2018-03-24 MED ORDER — SODIUM CHLORIDE 0.9 % IV BOLUS
1000.0000 mL | Freq: Once | INTRAVENOUS | Status: AC
  Administered 2018-03-24: 1000 mL via INTRAVENOUS

## 2018-03-24 MED ORDER — SODIUM CHLORIDE 0.9 % IV SOLN
2.0000 g | Freq: Once | INTRAVENOUS | Status: AC
  Administered 2018-03-24: 2 g via INTRAVENOUS
  Filled 2018-03-24: qty 2

## 2018-03-24 MED ORDER — ENOXAPARIN SODIUM 40 MG/0.4ML ~~LOC~~ SOLN
40.0000 mg | SUBCUTANEOUS | Status: DC
  Administered 2018-03-24 – 2018-03-25 (×2): 40 mg via SUBCUTANEOUS
  Filled 2018-03-24 (×2): qty 0.4

## 2018-03-24 MED ORDER — ACETAMINOPHEN 325 MG PO TABS
650.0000 mg | ORAL_TABLET | Freq: Once | ORAL | Status: AC
  Administered 2018-03-24: 650 mg via ORAL
  Filled 2018-03-24: qty 2

## 2018-03-24 MED ORDER — ACETAMINOPHEN 325 MG PO TABS
650.0000 mg | ORAL_TABLET | Freq: Four times a day (QID) | ORAL | Status: DC | PRN
  Administered 2018-03-24 – 2018-03-25 (×2): 650 mg via ORAL
  Filled 2018-03-24 (×2): qty 2

## 2018-03-24 MED ORDER — LABETALOL HCL 300 MG PO TABS
300.0000 mg | ORAL_TABLET | Freq: Once | ORAL | Status: AC
  Administered 2018-03-24: 300 mg via ORAL
  Filled 2018-03-24: qty 1

## 2018-03-24 MED ORDER — SODIUM CHLORIDE 0.9 % IV SOLN
INTRAVENOUS | Status: DC | PRN
  Administered 2018-03-25: 1000 mL via INTRAVENOUS

## 2018-03-24 MED ORDER — TRAMADOL HCL 50 MG PO TABS
50.0000 mg | ORAL_TABLET | Freq: Four times a day (QID) | ORAL | Status: DC | PRN
  Filled 2018-03-24 (×2): qty 1

## 2018-03-24 MED ORDER — LORATADINE 10 MG PO TABS
10.0000 mg | ORAL_TABLET | Freq: Every day | ORAL | Status: DC
  Administered 2018-03-25: 10 mg via ORAL
  Filled 2018-03-24 (×2): qty 1

## 2018-03-24 MED ORDER — METRONIDAZOLE IN NACL 5-0.79 MG/ML-% IV SOLN
500.0000 mg | Freq: Three times a day (TID) | INTRAVENOUS | Status: DC
  Administered 2018-03-24: 500 mg via INTRAVENOUS
  Filled 2018-03-24: qty 100

## 2018-03-24 MED ORDER — MENTHOL 3 MG MT LOZG
1.0000 | LOZENGE | OROMUCOSAL | Status: DC | PRN
  Filled 2018-03-24: qty 9

## 2018-03-24 MED ORDER — FLUTICASONE PROPIONATE 50 MCG/ACT NA SUSP
2.0000 | Freq: Every day | NASAL | Status: DC
  Administered 2018-03-25 – 2018-03-27 (×3): 2 via NASAL
  Filled 2018-03-24 (×4): qty 16

## 2018-03-24 MED ORDER — VANCOMYCIN HCL IN DEXTROSE 1-5 GM/200ML-% IV SOLN
1000.0000 mg | Freq: Once | INTRAVENOUS | Status: AC
  Administered 2018-03-24: 1000 mg via INTRAVENOUS
  Filled 2018-03-24: qty 200

## 2018-03-24 MED ORDER — VANCOMYCIN HCL 10 G IV SOLR
1250.0000 mg | INTRAVENOUS | Status: DC
  Administered 2018-03-26 – 2018-03-28 (×3): 1250 mg via INTRAVENOUS
  Filled 2018-03-24 (×6): qty 1250

## 2018-03-24 NOTE — ED Notes (Signed)
REPORT given, pt ready for transport.

## 2018-03-24 NOTE — Telephone Encounter (Signed)
Pt.'s daughter,Olivia Patel, reports that Monday pt. Started becoming weaker and having some confusion. Was discharged from the hospital this past Saturday. Recommended pt. Go to ED for evaluation. Daughter verbalizes understanding.  Reason for Disposition . Patient sounds very sick or weak to the triager  Answer Assessment - Initial Assessment Questions 1. DESCRIPTION: "Describe how you are feeling."     Weakness 2. SEVERITY: "How bad is it?"  "Can you stand and walk?"   - MILD - Feels weak or tired, but does not interfere with work, school or normal activities   - [place] to stand and walk; weakness interferes with work, school, or normal activities   - SEVERE - Unable to stand or walk     Moderate 3. ONSET:  "When did the weakness begin?"     Started this Monday 4. CAUSE: "What do you think is causing the weakness?"     Maybe her electrolytes are off 5. MEDICINES: "Have you recently started a new medicine or had a change in the amount of a medicine?"     No 6. OTHER SYMPTOMS: "Do you have any other symptoms?" (e.g., chest pain, fever, cough, SOB, vomiting, diarrhea, bleeding, other areas of pain)     Some confusion 7. PREGNANCY: "Is there any chance you are pregnant?" "When was your last menstrual period?"     No  Protocols used: WEAKNESS (GENERALIZED) AND FATIGUE-A-AH

## 2018-03-24 NOTE — ED Notes (Addendum)
ED TO INPATIENT HANDOFF REPORT  Name/Age/Gender Olivia Patel 57 y.o. female  Code Status Code Status History    Date Active Date Inactive Code Status Order ID Comments User Context   03/13/2018 2053 03/18/2018 1552 Full Code [id number]  Vianne Bulls, MD ED   09/17/2017 1410 09/26/2017 1721 Full Code [id number]  Elease Hashimoto ED      Home/SNF/Other Home  Chief Complaint Confusion; No appetite  Level of Care/Admitting Diagnosis ED Disposition    ED Disposition Condition [facility]: [place] [[id number]]  Level of Care: Telemetry [5]  Admit to tele based on following criteria: Other see comments  Comments: sepsis  Diagnosis: Sepsis [facility]) [[id number]]  Admitting Physician: Desiree Hane [[id number]]  Attending Physician: Desiree Hane [phone]  Estimated length of stay: past midnight tomorrow  Certification:: I certify this patient will need inpatient services for at least 2 midnights  PT Class (Do Not Modify): Inpatient [101]  PT Acc Code (Do Not Modify): Private [1]       Medical History Past Medical History:  Diagnosis Date  . Anemia   . Arthritis   . Cancer ([place])   . Hypertension     Allergies No Known Allergies  IV Location/Drains/Wounds Patient Lines/Drains/Airways Status   Active Line/Drains/Airways    Name:   Placement date:   Placement time:   Site:   Days:   Implanted Port 10/25/17   10/25/17    1358    -   150          Labs/Imaging Results for orders placed or performed during the hospital encounter of 02/26/2018 (from the past 48 hour(s))  Comprehensive metabolic panel     Status: Abnormal   Collection Time: 03/16/2018  2:00 PM  Result Value Ref Range   Sodium 134 (L) 135 - 145 mmol/L   Potassium 3.2 (L) 3.5 - 5.1 mmol/L   Chloride 89 (L) 98 - 111 mmol/L   CO2 27 22 - 32 mmol/L   Glucose, Bld 108 (H) 70 - 99 mg/dL   BUN 15 6 - 20 mg/dL   Creatinine, Ser 0.71 0.44 - 1.00 mg/dL   Calcium 10.6  (H) 8.9 - 10.3 mg/dL   Total Protein 8.1 6.5 - 8.1 g/dL   Albumin 3.2 (L) 3.5 - 5.0 g/dL   AST 33 15 - 41 U/L   ALT 36 0 - 44 U/L   Alkaline Phosphatase 195 (H) 38 - 126 U/L   Total Bilirubin 1.2 0.3 - 1.2 mg/dL   GFR calc non Af Amer >60 >60 mL/min   GFR calc Af Amer >60 >60 mL/min   Anion gap 18 (H) 5 - 15    Comment: Performed at [facility], [place] [address], [place], [place] [address]  CBC with Differential     Status: Abnormal   Collection Time: 03/19/2018  2:00 PM  Result Value Ref Range   WBC 14.3 (H) 4.0 - 10.5 K/uL   RBC 3.37 (L) 3.87 - 5.11 MIL/uL   Hemoglobin 10.2 (L) 12.0 - 15.0 g/dL   HCT 31.1 (L) 36.0 - 46.0 %   MCV 92.3 80.0 - 100.0 fL   MCH 30.3 26.0 - 34.0 pg   MCHC 32.8 30.0 - 36.0 g/dL   RDW 18.3 (H) 11.5 - 15.5 %   Platelets 156 150 - 400 K/uL   nRBC 0.0 0.0 - 0.2 %   Neutrophils Relative % 82 %  Neutro Abs 11.7 (H) 1.7 - 7.7 K/uL   Lymphocytes Relative 10 %   Lymphs Abs 1.4 0.7 - 4.0 K/uL   Monocytes Relative 6 %   Monocytes Absolute 0.9 0.1 - 1.0 K/uL   Eosinophils Relative 0 %   Eosinophils Absolute 0.0 0.0 - 0.5 K/uL   Basophils Relative 0 %   Basophils Absolute 0.0 0.0 - 0.1 K/uL   Immature Granulocytes 2 %   Abs Immature Granulocytes 0.26 (H) 0.00 - 0.07 K/uL    Comment: Performed at [facility], [place] [address] [place], [place] [address]  Protime-INR     Status: Abnormal   Collection Time: 03/10/2018  2:00 PM  Result Value Ref Range   Prothrombin Time 16.2 (H) 11.4 - 15.2 seconds   INR 1.31     Comment: Performed at [facility], [place] [address] [place], [place] [address]  CG4 I-STAT (Lactic acid)     Status: None   Collection Time: 03/15/2018  2:07 PM  Result Value Ref Range   Lactic Acid, Venous 1.51 0.5 - 1.9 mmol/L  CG4 I-STAT (Lactic acid)     Status: None   Collection Time: 03/17/2018  3:39 PM  Result Value Ref Range   Lactic Acid, Venous 1.24 0.5 - 1.9 mmol/L   Dg Chest 2  View  Result Date: 03/08/2018 CLINICAL DATA:  Weakness and confusion. EXAM: CHEST - 2 VIEW COMPARISON:  03/13/2018.  09/24/2017.  CT 12/22/2017. FINDINGS: PowerPort catheter with tip over superior vena cava. Heart size normal. Radiopacities noted over the ribs bilaterally are again noted and most consistent with healing fractures as noted on prior chest x-ray of 03/13/2018 and CT 12/22/2017. Small bilateral pleural effusions. No pneumothorax. IMPRESSION: 1.  PowerPort catheter stable position. 2. Radiopacities previously noted over the ribs bilaterally are again noted and again are most consistent with healing fractures as noted on prior chest x-ray of 03/13/2018 and chest CT of 12/22/2017. Small bilateral pleural effusions. No pneumothorax. Electronically Signed   By: Marcello Moores  Register   On: 02/25/2018 13:59   None  Pending Labs Unresulted Labs (From admission, onward)    Start     Ordered   03/25/2018 1459  Urine culture  ONCE - STAT,   STAT     02/25/2018 1458   03/14/2018 1329  Culture, blood (Routine x 2)  BLOOD CULTURE X 2,   STAT     03/05/2018 1328   03/20/2018 1329  Urinalysis, Routine w reflex microscopic  ONCE - STAT,   STAT     03/17/2018 1328          Vitals/Pain Today's Vitals   02/26/2018 1622 02/24/2018 1630 02/25/2018 1700 03/23/2018 1730  BP:  (!) 154/95 (!) 153/91 139/81  Pulse: 98 98 93 97  Resp:  (!) 28 (!) 25 (!) 34  Temp: 98.8 F (37.1 C)     TempSrc: Oral     SpO2:  96% 96% 96%  Weight:      Height:      PainSc:        Isolation Precautions No active isolations  Medications Medications  metroNIDAZOLE (FLAGYL) IVPB 500 mg (0 mg Intravenous Stopped 03/03/2018 1800)  acetaminophen (TYLENOL) tablet 650 mg (650 mg Oral Given 03/16/2018 1453)  sodium chloride 0.9 % bolus 1,000 mL (1,000 mLs Intravenous New Bag/Given (Non-Interop) 02/26/2018 1452)  ceFEPIme (MAXIPIME) 2 g in sodium chloride 0.9 % 100 mL IVPB (0 g Intravenous Stopped 03/05/2018 1528)  vancomycin (VANCOCIN) IVPB  1000  mg/200 mL premix (0 mg Intravenous Stopped 03/20/2018 1642)  labetalol (NORMODYNE) tablet 300 mg (300 mg Oral Given 03/13/2018 1531)    Mobility Wheelchair

## 2018-03-24 NOTE — ED Notes (Signed)
Place pt on bedpan but she was unable to urinate

## 2018-03-24 NOTE — ED Notes (Signed)
Discussed the DNR bracelet with pt, pt states she wishes to be full code until she can discuss with her family and children. Dr Teryl Lucy notified and will change to full code.

## 2018-03-24 NOTE — ED Provider Notes (Addendum)
[facility] DEPT Provider Note   CSN: [id number] Arrival date & time: 03/25/2018  1307     History   Chief Complaint Chief Complaint  Patient presents with  . Altered Mental Status    HPI Olivia Patel is a 57 y.o. female presenting for evaluation of confusion.  Patient states her daughter is concerned that she has been more confused in the past several days.  Patient states she feels intermittently she has had difficulty finding her words, but she is not confused currently.  She does feel she has been a little bit more tired and weak the past 2 days than normal.  She denies fevers or chills at home.  She denies chest pain, shortness of breath, cough, nausea, vomiting, abdominal pain, urinary symptoms, normal bowel movements.  Patient has a history of breast cancer, for which she is not currently receiving treatments.  She is scheduled to start chemotherapy early next month.  Patient states she has a history of high blood pressure for which she takes labetalol twice a day, she has not had her medication today.  She has no other medical problems.  Patient states she was recently admitted to the hospital last week for febrile illness, although there is no known source at that time.  HPI  Past Medical History:  Diagnosis Date  . Anemia   . Arthritis   . Cancer ([place])   . Hypertension     Patient Active Problem List   Diagnosis Date Noted  . SIRS (systemic inflammatory response syndrome) ([place]) 03/13/2018  . Hyponatremia 03/13/2018  . Acute bacterial sinusitis 03/13/2018  . Port-A-Cath in place 10/28/2017  . Breast cancer ([place]) 09/27/2017  . Goals of care, counseling/discussion 09/27/2017  . Anemia 09/17/2017  . Hypercalcemia 09/17/2017  . Hypokalemia 09/17/2017  . Rotator cuff tendinitis 08/11/2016  . Essential hypertension 09/17/2015  . Arthritis 10/09/2012    Past Surgical History:  Procedure Laterality Date  . IR IMAGING GUIDED PORT  INSERTION  10/25/2017  . TUBAL LIGATION       OB History    Gravida  6   Para  6   Term  6   Preterm      AB      Living  6     SAB      TAB      Ectopic      Multiple      Live Births  6            Home Medications    Prior to Admission medications   Medication Sig Start Date End Date Taking? Authorizing Provider  acetaminophen (TYLENOL) 325 MG tablet Take 2 tablets (650 mg total) by mouth every 6 (six) hours as needed for mild pain (or Fever >/= 101). 03/18/18   Regalado, Belkys A, MD  fluticasone (FLONASE) 50 MCG/ACT nasal spray Place 2 sprays into both nostrils daily. 09/26/17   Bonnielee Haff, MD  ibuprofen (ADVIL,MOTRIN) 200 MG tablet Take 400 mg by mouth every 6 (six) hours as needed.    [provider]  labetalol (NORMODYNE) 300 MG tablet Take 1 tablet (300 mg total) by mouth 2 (two) times daily. 02/17/18   Owens Shark, NP  lidocaine-prilocaine (EMLA) cream Apply to portacath site 1 hour prior to use 10/28/17   Owens Shark, NP  loratadine (CLARITIN) 10 MG tablet Take 1 tablet (10 mg total) by mouth daily. 03/19/18   Regalado, Cassie Freer, MD  Menthol (COUGH DROPS) 10  MG LOZG Use as directed 1 lozenge in the mouth or throat every 2 (two) hours as needed (cough).    [provider]  Menthol, Topical Analgesic, (ICY HOT BACK EX) Apply 1 application topically daily as needed (arthritis pain).    [provider]  prochlorperazine (COMPAZINE) 10 MG tablet Take 1 tablet (10 mg total) by mouth every 6 (six) hours as needed for nausea or vomiting. 09/30/17   Tanner, Lyndon Code., PA-C  senna-docusate (SENOKOT-S) 8.6-50 MG tablet Take 1 tablet by mouth at bedtime as needed for mild constipation. 09/26/17   Bonnielee Haff, MD    Family History Family History  Problem Relation Age of Onset  . Hypertension Mother   . Diabetes Mother   . Diabetes Sister   . Hypertension Father   . Kidney failure Father     Social History Social History    Tobacco Use  . Smoking status: Never Smoker  . Smokeless tobacco: Never Used  Substance Use Topics  . Alcohol use: No    Alcohol/week: 0.0 standard drinks  . Drug use: No     Allergies   Patient has no known allergies.   Review of Systems Review of Systems  Constitutional: Positive for fever.  Neurological: Positive for weakness.       Confusion  All other systems reviewed and are negative.    Physical Exam Updated Vital Signs BP (!) 173/99   Pulse (!) 115   Temp (!) 100.6 F (38.1 C) (Oral)   Resp (!) 29   Ht 5\' 3"  (1.6 m)   Wt 74.4 kg   LMP 05/29/2015 Comment: ligation  SpO2 97%   BMI 29.05 kg/m   Physical Exam  Constitutional: She is oriented to person, place, and time. No distress.  Appears older than stated age, in NAD  HENT:  Head: Normocephalic and atraumatic.  Eyes: Pupils are equal, round, and reactive to light. Conjunctivae and EOM are normal.  Neck: Normal range of motion. Neck supple.  Cardiovascular: Regular rhythm and intact distal pulses.  Tachycardic around 130  Pulmonary/Chest: Effort normal and breath sounds normal. No respiratory distress. She has no wheezes.  No increased WOB. Speaking in full sentences. Clear lung sounds  Abdominal: Soft. She exhibits no distension and no mass. There is no tenderness. There is no guarding.  Musculoskeletal: Normal range of motion.  Neurological: She is alert and oriented to person, place, and time.  Skin: Skin is warm and dry. Capillary refill takes less than 2 seconds.  Psychiatric: She has a normal mood and affect.  Nursing note and vitals reviewed.    ED Treatments / Results  Labs (all labs ordered are listed, but only abnormal results are displayed) Labs Reviewed  COMPREHENSIVE METABOLIC PANEL - Abnormal; Notable for the following components:      Result Value   Sodium 134 (*)    Potassium 3.2 (*)    Chloride 89 (*)    Glucose, Bld 108 (*)    Calcium 10.6 (*)    Albumin 3.2 (*)     Alkaline Phosphatase 195 (*)    Anion gap 18 (*)    All other components within normal limits  CBC WITH DIFFERENTIAL/PLATELET - Abnormal; Notable for the following components:   WBC 14.3 (*)    RBC 3.37 (*)    Hemoglobin 10.2 (*)    HCT 31.1 (*)    RDW 18.3 (*)    Neutro Abs 11.7 (*)    Abs Immature Granulocytes 0.26 (*)  All other components within normal limits  PROTIME-INR - Abnormal; Notable for the following components:   Prothrombin Time 16.2 (*)    All other components within normal limits  CULTURE, BLOOD (ROUTINE X 2)  CULTURE, BLOOD (ROUTINE X 2)  URINE CULTURE  URINALYSIS, ROUTINE W REFLEX MICROSCOPIC  I-STAT CG4 LACTIC ACID, ED  CG4 I-STAT (LACTIC ACID)  I-STAT CG4 LACTIC ACID, ED  CG4 I-STAT (LACTIC ACID)    EKG None  Radiology Dg Chest 2 View  Result Date: 03/10/2018 CLINICAL DATA:  Weakness and confusion. EXAM: CHEST - 2 VIEW COMPARISON:  03/13/2018.  09/24/2017.  CT 12/22/2017. FINDINGS: PowerPort catheter with tip over superior vena cava. Heart size normal. Radiopacities noted over the ribs bilaterally are again noted and most consistent with healing fractures as noted on prior chest x-ray of 03/13/2018 and CT 12/22/2017. Small bilateral pleural effusions. No pneumothorax. IMPRESSION: 1.  PowerPort catheter stable position. 2. Radiopacities previously noted over the ribs bilaterally are again noted and again are most consistent with healing fractures as noted on prior chest x-ray of 03/13/2018 and chest CT of 12/22/2017. Small bilateral pleural effusions. No pneumothorax. Electronically Signed   By: Marcello Moores  Register   On: 03/03/2018 13:59    Procedures Procedures (including critical care time)  Medications Ordered in ED Medications  metroNIDAZOLE (FLAGYL) IVPB 500 mg (has no administration in time range)  vancomycin (VANCOCIN) IVPB 1000 mg/200 mL premix (1,000 mg Intravenous New Bag/Given 03/07/2018 1530)  acetaminophen (TYLENOL) tablet 650 mg (650 mg Oral  Given 03/18/2018 1453)  sodium chloride 0.9 % bolus 1,000 mL (1,000 mLs Intravenous New Bag/Given (Non-Interop) 03/11/2018 1452)  ceFEPIme (MAXIPIME) 2 g in sodium chloride 0.9 % 100 mL IVPB (0 g Intravenous Stopped 02/28/2018 1528)  labetalol (NORMODYNE) tablet 300 mg (300 mg Oral Given 02/28/2018 1531)     Initial Impression / Assessment and Plan / ED Course  I have reviewed the triage vital signs and the nursing notes.  Pertinent labs & imaging results that were available during my care of the patient were reviewed by me and considered in my medical decision making (see chart for details).     Pt presenting for evaluation of confusion.  Physical exam concerning, patient is febrile and tachycardic to 130.  However, patient reporting no infectious symptoms at this time.  Patient does have breast cancer, but not currently receiving chemo or radiation, this is planned for next week.  Patient was recently admitted for febrile illness last week, no source found.  She had improved for several days, but worsened yesterday.  Sepsis protocol started with fluids and antibiotics, but no code sepsis called, as there was no obvious source initially.  Labs with elevated white count at 14.3, especially when compared to last week.  However, lactic reassuring.  Labs otherwise at patient's baseline.  Chest x-ray viewed and interpreted by me, no new pneumonia.  Does show small bilateral pleural effusions, but I do believe this is the source of patient symptoms.  UA pending.  Heart rate improving with fluids and patient's home labetalol.  Patient is becoming more tachypneic, but on reevaluation appears in no distress.  Will consult with oncology for further management.  Discussed with on-call oncology, who recommends admission due to patient's vital signs, leukocytosis, and concerning mental status changes per daughter.  Case discussed with attending, Dr. Roderic Palau evaluated the pt. Will call for admission.  Discussed with  Dr[facility] Lonny Prude [facility], patient to be admitted.   Final Clinical Impressions(s) /  ED Diagnoses   Final diagnoses:  Febrile illness  Leukocytosis, unspecified type    ED Discharge Orders    None       Franchot Heidelberg, PA-C 03/01/2018 1759    Milton Ferguson, MD 03/25/18 0840    Franchot Heidelberg, PA-C 04/03/18 7867    Milton Ferguson, MD 04/03/18 1527

## 2018-03-24 NOTE — H&P (Signed)
[facility] History and Physical  Latash Nouri VPX:[id number] DOB: 29-Apr-1960 DOA: 02/25/2018  Referring physician: Franchot Heidelberg PCP: Lance Sell, NP   Chief Complaint: feeling weak  HPI: Loy Little is a 57 y.o. female with medical history significant for stage IV breast cancer with osseous mets,  ( no chemotherapy)* who presents on 03/02/2018 with few days of general malaise, fatigue and subjective fevers and chills found to have Sirs with no clear source of infection..    Recently discharged from 11/18-11/23 for SIRS with no clear source of infection on work up. Empirically treated with vancomycin and cefepime. Culture was negative and discharged on augmentin. She completed that course over the course of three days.  Since then  she reports feeling weak and tired for several days.  Her daughter reports she has been having slurred speech.  Her brother corroborates history as well and states that she has not had much time to rest since being home.  She still endorses diminished appetite, some subjective fevers and chills.  She has a chronic cough but no acute change in status.  No dysuria, no chest pain, no diarrhea.  Breast cancer was diagnosed in May. Started chemotherapy in July    ED Course:  Tmax 100.6, respiratory rate 25-40, SPO2 99%, heart rate 96-1 15, blood pressure 160/97. UA unremarkable, Lactic acid 1.5, calcium 10.6, potassium 3.2, chloride 89, glucose 108. WBC 14.3, hemoglobin 10.2, Chest x-ray showed stable PowerPort catheter, and small bilateral pleural effusions  Patient was given IV vancomycin, cefepime, labetalol (oral). [facility] called for further management.  Review of Systems:  Constitutional:  No weight loss, night sweats, Fevers, chills, fatigue.  HEENT:  No headaches, Difficulty swallowing,Tooth/dental problems,Sore throat,  No sneezing, itching, ear ache, nasal congestion, post nasal drip,  Cardio-vascular:  No chest  pain, Orthopnea, PND, swelling in lower extremities, anasarca, dizziness, palpitations  GI:  No heartburn, indigestion, abdominal pain, nausea, vomiting, diarrhea, change in bowel habits, loss of appetite  Resp:  No shortness of breath with exertion or at rest. No excess mucus, no productive cough, No non-productive cough, No coughing up of blood.No change in color of mucus.No wheezing.No chest wall deformity  Skin:  no rash or lesions.  GU:  no dysuria, change in color of urine, no urgency or frequency. No flank pain.  Musculoskeletal:  No joint pain or swelling. No decreased range of motion. No back pain.  Psych:  No change in mood or affect. No depression or anxiety. No memory loss.   Past Medical History:  Diagnosis Date  . Anemia   . Arthritis   . Cancer ([place])   . Hypertension    Past Surgical History:  Procedure Laterality Date  . IR IMAGING GUIDED PORT INSERTION  10/25/2017  . TUBAL LIGATION     Social History:  reports that she has never smoked. She has never used smokeless tobacco. She reports that she does not drink alcohol or use drugs.  No Known Allergies  Family History  Problem Relation Age of Onset  . Hypertension Mother   . Diabetes Mother   . Diabetes Sister   . Hypertension Father   . Kidney failure Father       Prior to Admission medications   Medication Sig Start Date End Date Taking? Authorizing Provider  acetaminophen (TYLENOL) 325 MG tablet Take 2 tablets (650 mg total) by mouth every 6 (six) hours as needed for mild pain (or Fever >/= 101). 03/18/18   Regalado, Cassie Freer, MD  fluticasone (FLONASE) 50 MCG/ACT nasal spray Place 2 sprays into both nostrils daily. 09/26/17   Bonnielee Haff, MD  ibuprofen (ADVIL,MOTRIN) 200 MG tablet Take 400 mg by mouth every 6 (six) hours as needed.    [provider]  labetalol (NORMODYNE) 300 MG tablet Take 1 tablet (300 mg total) by mouth 2 (two) times daily. 02/17/18   Owens Shark, NP  lidocaine-prilocaine  (EMLA) cream Apply to portacath site 1 hour prior to use 10/28/17   Owens Shark, NP  loratadine (CLARITIN) 10 MG tablet Take 1 tablet (10 mg total) by mouth daily. 03/19/18   Regalado, Belkys A, MD  Menthol (COUGH DROPS) 10 MG LOZG Use as directed 1 lozenge in the mouth or throat every 2 (two) hours as needed (cough).    [provider]  Menthol, Topical Analgesic, (ICY HOT BACK EX) Apply 1 application topically daily as needed (arthritis pain).    [provider]  prochlorperazine (COMPAZINE) 10 MG tablet Take 1 tablet (10 mg total) by mouth every 6 (six) hours as needed for nausea or vomiting. 09/30/17   Tanner, Lyndon Code., PA-C  senna-docusate (SENOKOT-S) 8.6-50 MG tablet Take 1 tablet by mouth at bedtime as needed for mild constipation. 09/26/17   Bonnielee Haff, MD   Physical Exam: Vitals:   03/15/2018 1700 03/09/2018 1730 02/26/2018 1800 03/25/2018 1928  BP: (!) 153/91 139/81 (!) 155/89 (!) 147/81  Pulse: 93 97 96 99  Resp: (!) 25 (!) 34 (!) 25 16  Temp:    98.7 F (37.1 C)  TempSrc:    Oral  SpO2: 96% 96% 99% 99%  Weight:      Height:        Wt Readings from Last 3 Encounters:  03/05/2018 74.4 kg  03/13/18 73.6 kg  03/05/18 74.7 kg    Constitutional alert female, no distress Eyes: EOMI, anicteric, normal conjunctivae ENMT: Dry oral mucosa Neck: FROM,  Cardiovascular: Tachycardic no MRGs, with no peripheral edema Respiratory: Normal respiratory effort on room air, clear breath sounds  Abdomen: Soft,non-tender, normal bowel sounds Musculoskeletal: no clubbing / cyanosis. No joint deformity upper and lower extremities. Good ROM, no contractures. Normal muscle tone. Skin: No rash ulcers, or lesions. Without skin tenting  Neurologic: Grossly no focal neuro deficit.  Following commands well Psychiatric:Appropriate affect, and mood. Mental status AAOx3          Labs on Admission:  Basic Metabolic Panel: Recent Labs  Lab 02/26/2018 1400  NA 134*  K 3.2*  CL 89*  CO2 27   GLUCOSE 108*  BUN 15  CREATININE 0.71  CALCIUM 10.6*  MG 1.7   Liver Function Tests: Recent Labs  Lab 03/06/2018 1400  AST 33  ALT 36  ALKPHOS 195*  BILITOT 1.2  PROT 8.1  ALBUMIN 3.2*   No results for input(s): LIPASE, AMYLASE in the last 168 hours. No results for input(s): AMMONIA in the last 168 hours. CBC: Recent Labs  Lab 03/17/2018 1400  WBC 14.3*  NEUTROABS 11.7*  HGB 10.2*  HCT 31.1*  MCV 92.3  PLT 156   Cardiac Enzymes: No results for input(s): CKTOTAL, CKMB, CKMBINDEX, TROPONINI in the last 168 hours.  BNP (last 3 results) No results for input(s): BNP in the last 8760 hours.  ProBNP (last 3 results) No results for input(s): PROBNP in the last 8760 hours.  CBG: No results for input(s): GLUCAP in the last 168 hours.  Radiological Exams on Admission: Dg Chest 2 View  Result Date: 03/20/2018  CLINICAL DATA:  Weakness and confusion. EXAM: CHEST - 2 VIEW COMPARISON:  03/13/2018.  09/24/2017.  CT 12/22/2017. FINDINGS: PowerPort catheter with tip over superior vena cava. Heart size normal. Radiopacities noted over the ribs bilaterally are again noted and most consistent with healing fractures as noted on prior chest x-ray of 03/13/2018 and CT 12/22/2017. Small bilateral pleural effusions. No pneumothorax. IMPRESSION: 1.  PowerPort catheter stable position. 2. Radiopacities previously noted over the ribs bilaterally are again noted and again are most consistent with healing fractures as noted on prior chest x-ray of 03/13/2018 and chest CT of 12/22/2017. Small bilateral pleural effusions. No pneumothorax. Electronically Signed   By: Marcello Moores  Register   On: 02/25/2018 13:59    EKG: Independently reviewed. sinus tachycardia.  Assessment/Plan Active Problems:   Essential hypertension   Anemia   Hypercalcemia   Breast cancer ([place])   Port-A-Cath in place   SIRS (systemic inflammatory response syndrome) ([place])   Sepsis ([place])   1. Sirs criteria, unclear source of  infection.  Temp of 100.6, leukocytosis, tachycardia and tachypnea with no clear source as UA and CXR is unremarkable.  Continue empiric vancomycin (patient does have port in place) and cefepime.  Follow blood cultures.Rule out viral etiology with RVP  2. Hypercalcemia.  Likely explains generalized fatigue and weakness.  Only slightly elevated at 10.6.  Aggressively hydrate with fluids, monitor calcium and mental status  3. Hypertension.  SBP is in the 160s.Result of not taking medications before coming to the ED, and SIRS.  Resume home labetalol, monitor on telemetry.  4. Anemia chronic disease, stable.  Hemoglobin 10.2, baseline 7-9.  Monitor CBC  5. Breast cancer with metastasis to bone, diagnosed May 2019 follows with Dr. Benay Spice, last infusion on 03/03/2018.   Consultants: None Code Status: Full code DVT Prophylaxis: Lovenox Family Communication: Family updated at bedside Disposition Plan: Admit as inpatient, anticipate greater than 2 midnight stay.  Time spent: Less than 25 minutes  Desiree Hane MD [facility]  Pager [phone]  If 7PM-7AM, please contact night-coverage [email or web address] Password [facility]  02/26/2018, 10:35 PM

## 2018-03-24 NOTE — Progress Notes (Signed)
A consult was received from an ED physician for vancomycin and cefepime per pharmacy dosing.  The patient's profile has been reviewed for ht/wt/allergies/indication/available labs.   A one time order has been placed for cefepime 2gm and vancomycin 1gm.    Further antibiotics/pharmacy consults should be ordered by admitting physician if indicated.                       Thank you, Dolly Rias RPh 03/04/2018, 2:38 PM Pager [phone]

## 2018-03-24 NOTE — Progress Notes (Signed)
Pharmacy Antibiotic Note  Olivia Patel is a 57 y.o. female admitted on 03/09/2018 with sepsis.  Pharmacy has been consulted for Vancomycin & Cefepime dosing, Flagyl per MD  Plan: Cefepime 2gm x1, then 1gm q8 Vanc 1gm  > 1250mg  q24 (AUC 477,IBW/ABW, Cr 0.71) Flagyl 500mg  q8  Height: 5\' 3"  (160 cm) Weight: 164 lb (74.4 kg) IBW/kg (Calculated) : 52.4  Temp (24hrs), Avg:99.7 F (37.6 C), Min:98.8 F (37.1 C), Max:100.6 F (38.1 C)  Recent Labs  Lab 03/15/2018 1400 02/28/2018 1407 03/07/2018 1539  WBC 14.3*  --   --   CREATININE 0.71  --   --   LATICACIDVEN  --  1.51 1.24    Estimated Creatinine Clearance: 76.8 mL/min (by C-G formula based on SCr of 0.71 mg/dL).    No Known Allergies  Antimicrobials this admission: 11/29 Cefepime >>  11/29 Vanc >>  11/29 Flagyl >>  Dose adjustments this admission:  Microbiology results: 11/29 BCx: sent 11/29 UCx: sent   Prev Cx: 11/20 BCx: ng-final 11/20 Resp PCR: neg 11/29 UCx: < 10K colonies 11/28 BCx: ng-final   Thank you for allowing pharmacy to be a part of this patient's care.  Minda Ditto PharmD Pager [phone] 03/02/2018, 7:31 PM

## 2018-03-24 NOTE — ED Triage Notes (Signed)
Patient was recently discharged from the hospital  (6 days ago). Patient's daughter reports that the patient has had worsening confusion  X 4 days. Patient has a history of breast cancer.

## 2018-03-24 NOTE — ED Notes (Signed)
Put pt on bedpan for urine sample.

## 2018-03-25 ENCOUNTER — Encounter (HOSPITAL_COMMUNITY): Payer: Self-pay | Admitting: *Deleted

## 2018-03-25 ENCOUNTER — Inpatient Hospital Stay (HOSPITAL_COMMUNITY): Payer: Medicare Other

## 2018-03-25 DIAGNOSIS — C50911 Malignant neoplasm of unspecified site of right female breast: Secondary | ICD-10-CM

## 2018-03-25 DIAGNOSIS — C7951 Secondary malignant neoplasm of bone: Secondary | ICD-10-CM

## 2018-03-25 DIAGNOSIS — Z9221 Personal history of antineoplastic chemotherapy: Secondary | ICD-10-CM

## 2018-03-25 DIAGNOSIS — R6510 Systemic inflammatory response syndrome (SIRS) of non-infectious origin without acute organ dysfunction: Secondary | ICD-10-CM

## 2018-03-25 DIAGNOSIS — R509 Fever, unspecified: Secondary | ICD-10-CM

## 2018-03-25 DIAGNOSIS — G9340 Encephalopathy, unspecified: Secondary | ICD-10-CM

## 2018-03-25 DIAGNOSIS — E871 Hypo-osmolality and hyponatremia: Secondary | ICD-10-CM

## 2018-03-25 DIAGNOSIS — Z95828 Presence of other vascular implants and grafts: Secondary | ICD-10-CM

## 2018-03-25 LAB — RESPIRATORY PANEL BY PCR
Adenovirus: NOT DETECTED
BORDETELLA PERTUSSIS-RVPCR: NOT DETECTED
Chlamydophila pneumoniae: NOT DETECTED
Coronavirus 229E: NOT DETECTED
Coronavirus HKU1: NOT DETECTED
Coronavirus NL63: NOT DETECTED
Coronavirus OC43: NOT DETECTED
Influenza A: NOT DETECTED
Influenza B: NOT DETECTED
METAPNEUMOVIRUS-RVPPCR: NOT DETECTED
Mycoplasma pneumoniae: NOT DETECTED
PARAINFLUENZA VIRUS 2-RVPPCR: NOT DETECTED
Parainfluenza Virus 1: NOT DETECTED
Parainfluenza Virus 3: NOT DETECTED
Parainfluenza Virus 4: NOT DETECTED
Respiratory Syncytial Virus: NOT DETECTED
Rhinovirus / Enterovirus: NOT DETECTED

## 2018-03-25 LAB — COMPREHENSIVE METABOLIC PANEL
ALT: 26 U/L (ref 0–44)
AST: 26 U/L (ref 15–41)
Albumin: 2.7 g/dL — ABNORMAL LOW (ref 3.5–5.0)
Alkaline Phosphatase: 168 U/L — ABNORMAL HIGH (ref 38–126)
Anion gap: 10 (ref 5–15)
BUN: 11 mg/dL (ref 6–20)
CO2: 29 mmol/L (ref 22–32)
Calcium: 9.9 mg/dL (ref 8.9–10.3)
Chloride: 94 mmol/L — ABNORMAL LOW (ref 98–111)
Creatinine, Ser: 0.49 mg/dL (ref 0.44–1.00)
GFR calc non Af Amer: >60 mL/min (ref >60)
Glucose, Bld: 126 mg/dL — ABNORMAL HIGH (ref 70–99)
Potassium: 4 mmol/L (ref 3.5–5.1)
Sodium: 133 mmol/L — ABNORMAL LOW (ref 135–145)
Total Bilirubin: 1.1 mg/dL (ref 0.3–1.2)
Total Protein: 6.9 g/dL (ref 6.5–8.1)

## 2018-03-25 LAB — CBC
HCT: 27.5 % — ABNORMAL LOW (ref 36.0–46.0)
Hemoglobin: 8.6 g/dL — ABNORMAL LOW (ref 12.0–15.0)
MCH: 29.1 pg (ref 26.0–34.0)
MCHC: 31.3 g/dL (ref 30.0–36.0)
MCV: 92.9 fL (ref 80.0–100.0)
Platelets: 144 10*3/uL — ABNORMAL LOW (ref 150–400)
RBC: 2.96 MIL/uL — ABNORMAL LOW (ref 3.87–5.11)
RDW: 18.2 % — ABNORMAL HIGH (ref 11.5–15.5)
WBC: 11.7 10*3/uL — ABNORMAL HIGH (ref 4.0–10.5)
nRBC: 0 % (ref 0.0–0.2)

## 2018-03-25 MED ORDER — SODIUM CHLORIDE (PF) 0.9 % IJ SOLN
INTRAMUSCULAR | Status: AC
  Filled 2018-03-25: qty 50

## 2018-03-25 MED ORDER — LORAZEPAM 2 MG/ML IJ SOLN
1.0000 mg | Freq: Once | INTRAMUSCULAR | Status: AC
  Administered 2018-03-25: 1 mg via INTRAVENOUS
  Filled 2018-03-25: qty 1

## 2018-03-25 MED ORDER — IOHEXOL 300 MG/ML  SOLN
30.0000 mL | Freq: Once | INTRAMUSCULAR | Status: AC | PRN
  Administered 2018-03-25: 30 mL via ORAL

## 2018-03-25 MED ORDER — IOHEXOL 300 MG/ML  SOLN
100.0000 mL | Freq: Once | INTRAMUSCULAR | Status: AC | PRN
  Administered 2018-03-25: 100 mL via INTRAVENOUS

## 2018-03-25 MED ORDER — SODIUM CHLORIDE 0.9 % IV SOLN: INTRAVENOUS | Status: DC

## 2018-03-25 MED ORDER — IOPAMIDOL (ISOVUE-300) INJECTION 61%
INTRAVENOUS | Status: AC
  Filled 2018-03-25: qty 100

## 2018-03-25 NOTE — Consult Note (Signed)
Date of Admission:  03/06/2018          Reason for Consult: SIRS, FUO     Referring Provider: Dr. Algis Liming   Assessment:  1. FUO, SIRS 2. Metastatic breast cancer on chemotherapy (Taxol/caboplatin) 3. Port in place  4. Encephalopathy  Plan:  1. CT chest abdomen and pelvis  2. I will order other routine FUO labs 3. Continue to carefully examine the patient 4. Hopefully she becomes more alert so I can get a better history from her 5. Consider MRI brain w GAD 6. If NO SOURCE of FUO found I would DC PORT and culture site  Active Problems:   Essential hypertension   Anemia   Hypercalcemia   Breast cancer ([place])   Port-A-Cath in place   SIRS (systemic inflammatory response syndrome) ([facility])   Sepsis ([facility])   Scheduled Meds: . enoxaparin (LOVENOX) injection  40 mg Subcutaneous Q24H  . fluticasone  2 spray Each Nare Daily  . labetalol  300 mg Oral BID  . loratadine  10 mg Oral Daily  . sodium chloride (PF)       Continuous Infusions: . sodium chloride    . sodium chloride 75 mL/hr at 03/25/18 1602  . ceFEPime (MAXIPIME) IV Stopped (03/25/18 1626)  . vancomycin 166.7 mL/hr at 03/25/18 1201   PRN Meds:.sodium chloride, acetaminophen, lidocaine-prilocaine, menthol-cetylpyridinium, prochlorperazine, senna-docusate, sodium chloride flush, traMADol  HPI: Olivia Patel is a 57 y.o. female 57 year old female with PMH of metastatic breast cancer with bone mets, completed cycle 6 weekly Taxol/carboplatin on 03/03/2018, no evidence of disease progression as per last oncology office visit 03/03/2018, plans to refer to general surgery to consider partial or full right breast mastectomy for remaining mass. she had developed hypercalcemia, , anemia secondary to metastatic breast cancer and chemotherapy, hospitalized for fever 08/2017 without clear cause. She had  recent hospitalization 03/13/2018-03/18/2018 when she presented with fever, sinus congestion and right eye pain, was managed  for SIRS, extensive work-up including chest x-ray, flu panel & RSV panel PCR, blood cultures were negative, she was treated initially with IV vancomycin and cefepime, transitioned to oral Augmentin at discharge. She returned to  to [facility] ED on 03/18/2018 with complaints of chills and fevers (up to 101.5 F), generalized malaise, fatigue, body aches consistent with SIRS without clear source of infection.  Reported sick contacts (adult daughter with URI symptoms and 4-year-old grandchild with recent diarrhea). Admitted for SIRS. She is on vancomycin, cefepime and metronidazole. Blood cxs NG x 24 hrs, Resp panel negative. I do NOT see that she suffered from any neurtropenia during this or past hospitalization for SIRS.    Review of Systems: Review of Systems  Unable to perform ROS: Mental status change    Past Medical History:  Diagnosis Date  . Anemia   . Arthritis   . Cancer ([place])   . Hypertension     Social History   Tobacco Use  . Smoking status: Never Smoker  . Smokeless tobacco: Never Used  Substance Use Topics  . Alcohol use: No    Alcohol/week: 0.0 standard drinks  . Drug use: No    Family History  Problem Relation Age of Onset  . Hypertension Mother   . Diabetes Mother   . Diabetes Sister   . Hypertension Father   . Kidney failure Father    No Known Allergies  OBJECTIVE: Blood pressure (!) 144/75, pulse 97, temperature 99 F (37.2 C), temperature source Oral, resp. rate 16, height  5\' 3"  (1.6 m), weight 74.8 kg, last menstrual period 05/29/2015, SpO2 97 %.  Physical Exam  Constitutional: She appears well-developed and well-nourished. She appears lethargic.  HENT:  Head: Normocephalic.  Eyes: Conjunctivae and EOM are normal. Right eye exhibits no discharge. Left eye exhibits no discharge.  Neck: Normal range of motion. No JVD present.  Cardiovascular: Normal rate and regular rhythm.  Pulmonary/Chest: Effort normal. No respiratory distress. She has no wheezes.   Abdominal: Soft. Bowel sounds are normal. She exhibits distension. She exhibits no mass. There is no tenderness. There is no rebound and no guarding.  Musculoskeletal: Normal range of motion.  Neurological: She has normal strength. She appears lethargic.  She is disoriented and does not know where she is or apparently who her oncologist is or that she has breast cancer  Skin: Skin is warm. No rash noted. No erythema.  Psychiatric: Her speech is delayed. Cognition and memory are impaired. She exhibits abnormal recent memory and abnormal remote memory.   Port Is clean  NO ulcers seen though I did not take off her socks this evening  Lab Results Lab Results  Component Value Date   WBC 11.7 (H) 03/25/2018   HGB 8.6 (L) 03/25/2018   HCT 27.5 (L) 03/25/2018   MCV 92.9 03/25/2018   PLT 144 (L) 03/25/2018    Lab Results  Component Value Date   CREATININE 0.49 03/25/2018   BUN 11 03/25/2018   NA 133 (L) 03/25/2018   K 4.0 03/25/2018   CL 94 (L) 03/25/2018   CO2 29 03/25/2018    Lab Results  Component Value Date   ALT 26 03/25/2018   AST 26 03/25/2018   ALKPHOS 168 (H) 03/25/2018   BILITOT 1.1 03/25/2018     Microbiology: Recent Results (from the past 240 hour(s))  Culture, blood (Routine x 2)     Status: None (Preliminary result)   Collection Time: 03/07/2018  2:01 PM  Result Value Ref Range Status   Specimen Description   Final    BLOOD LEFT ANTECUBITAL Performed at [facility], [place] [address] [place], [place] [address]    Special Requests   Final    BOTTLES DRAWN AEROBIC AND ANAEROBIC Blood Culture adequate volume Performed at [facility] [address] [place], [place] [address]    Culture   Final    NO GROWTH < 24 HOURS Performed at [facility] [address], [place], [place] [address]    Report Status PENDING  Incomplete  Culture, blood (Routine x 2)     Status: None (Preliminary result)   Collection Time:  03/22/2018  2:52 PM  Result Value Ref Range Status   Specimen Description   Final    BLOOD LEFT CHEST Performed at [facility] [address], [place], [place] [address]    Special Requests   Final    BOTTLES DRAWN AEROBIC AND ANAEROBIC Blood Culture adequate volume Performed at [facility] [address] [place], [place] [address]    Culture   Final    NO GROWTH < 24 HOURS Performed at [facility] [address] [place], [place] [address]    Report Status PENDING  Incomplete  Respiratory Panel by PCR     Status: None   Collection Time: 03/25/18 12:34 AM  Result Value Ref Range Status   Adenovirus NOT DETECTED NOT DETECTED Final   Coronavirus 229E NOT DETECTED NOT DETECTED Final  Coronavirus HKU1 NOT DETECTED NOT DETECTED Final   Coronavirus NL63 NOT DETECTED NOT DETECTED Final   Coronavirus OC43 NOT DETECTED NOT DETECTED Final   Metapneumovirus NOT DETECTED NOT DETECTED Final   Rhinovirus / Enterovirus NOT DETECTED NOT DETECTED Final   Influenza A NOT DETECTED NOT DETECTED Final   Influenza B NOT DETECTED NOT DETECTED Final   Parainfluenza Virus 1 NOT DETECTED NOT DETECTED Final   Parainfluenza Virus 2 NOT DETECTED NOT DETECTED Final   Parainfluenza Virus 3 NOT DETECTED NOT DETECTED Final   Parainfluenza Virus 4 NOT DETECTED NOT DETECTED Final   Respiratory Syncytial Virus NOT DETECTED NOT DETECTED Final   Bordetella pertussis NOT DETECTED NOT DETECTED Final   Chlamydophila pneumoniae NOT DETECTED NOT DETECTED Final   Mycoplasma pneumoniae NOT DETECTED NOT DETECTED Final    Comment: Performed at [facility], [place] [address] [place],  [address]    Alcide Evener, [place] [facility] [phone] pager  03/25/2018, 5:54 PM

## 2018-03-25 NOTE — Plan of Care (Signed)
  Problem: Pain Managment: Goal: General experience of comfort will improve Outcome: Progressing   Problem: Safety: Goal: Ability to remain free from injury will improve Outcome: Progressing   Problem: Elimination: Goal: Will not experience complications related to bowel motility Outcome: Progressing   

## 2018-03-25 NOTE — Progress Notes (Signed)
PROGRESS NOTE   Olivia Patel  VEL:[id number]    DOB: 1960-07-18    DOA: 02/25/2018  PCP: Lance Sell, NP   I have briefly reviewed patients previous medical records in [facility].  Brief Narrative:  57 year old female with PMH of metastatic breast cancer with bone mets, completed cycle 6 weekly Taxol/carboplatin on 03/03/2018, no evidence of disease progression as per last oncology office visit 03/03/2018, plans to refer to general surgery to consider partial or full right breast mastectomy for remaining mass, hypercalcemia, HTN, anemia secondary to metastatic breast cancer and chemotherapy, hospitalized for culture-negative fever 08/2017, recent hospitalization 03/13/2018-03/18/2018 when she presented with fever, sinus congestion and right eye pain, was managed for SIRS, extensive work-up including chest x-ray, flu panel & RSV panel PCR, blood cultures were negative, she was treated initially with IV vancomycin and cefepime, transitioned to oral Augmentin at discharge, presented again to [facility] long ED on 04/23/2018 with complaints of chills and fevers (up to 101.5 F), generalized malaise, fatigue, body aches consistent with SIRS without clear source of infection.  Reports sick contacts (adult daughter with URI symptoms and 42-year-old grandchild with recent diarrhea). Admitted for SIRS. ID consulted and recommend CT C/A/P with contrast to further evaluate.   Assessment & Plan:   Active Problems:   Essential hypertension   Anemia   Hypercalcemia   Breast cancer ([place])   Port-A-Cath in place   SIRS (systemic inflammatory response syndrome) (HCC)   Sepsis (HCC)   Fever of unknown origin/SIRS, recurrent: She presented with fever/100.6, leukocytosis, tachycardia and tachypnea.  Unclear etiology.  Flu panel PCR x2 since 03/05/2018: Negative, RSV panel x2 since 03/15/2018: Negative, HIV screen 09/17/2017: Negative urine microscopy 11/29: Not indicative of UTI, blood cultures x2 from  11/20: Negative.  Blood cultures x2 from 11/29: Pending.  Urine culture 11/29: Pending.  Chest x-ray 03/01/2018 without acute findings.  Empirically started on IV cefepime and vancomycin.  I consulted and discussed with Dr. Tommy Medal, ID who recommended obtaining CT chest, abdomen and pelvis with IV and p.o. contrast to further evaluate for possible source of fever.  If negative then may have to consider removal of Port-A-Cath.  Hypercalcemia: Suspect due to dehydration.  Was 10.6 on admission, down to 9.9/uncorrected today.  Continue gentle IV fluids.  Essential hypertension: Mildly uncontrolled.  Continue prior home dose of labetalol.  Consider adding as needed hydralazine if needed.  Anemia of chronic disease: Related to metastatic breast cancer to bone and cancer chemotherapy.  Hemoglobin is dropped from 10.2 on admission to 8.6, some of it is possibly hemodilution.  Follow-up CBC in a.m.  Transfuse if hemoglobin 7 g or less.  Thrombocytopenia: Unclear etiology.  Mild.  Follow CBC in a.m.  Dehydration with hyponatremia: Sodium stable.  Continue gentle IV saline hydration.  Hypokalemia: Replaced.  Magnesium 1.7.  Metastatic breast cancer with bone metastasis: Will alert primary oncologist by adding name to care team.  Outpatient follow-up with oncology and consider discussing with them on 12/2.   DVT prophylaxis: Lovenox Code Status: Full Family Communication: None at bedside Disposition: DC home pending clinical improvement.   Consultants:  ID  Procedures:  None  Antimicrobials:  IV cefepime and vancomycin 11/29 >   Subjective: Overall feels slightly better.  Fever has subsided.  Still feels generally weak, generalized achiness.  Mild intermittent headaches.  No earache, sore throat, nasal drip, cough, nausea, vomiting, diarrhea, urinary symptoms.  No history of insect bites.  No joint pains or swellings.  Sick  contacts as noted above.  ROS: As above, otherwise  negative.  Objective:  Vitals:   03/01/2018 1800 03/09/2018 1928 03/25/18 0501 03/25/18 1219  BP: (!) 155/89 (!) 147/81 (!) 159/95 (!) 167/95  Pulse: 96 99 (!) 102 (!) 110  Resp: (!) 25 16 20 16   Temp:  98.7 F (37.1 C) 99.3 F (37.4 C)   TempSrc:  Oral Oral   SpO2: 99% 99% 97% 98%  Weight:   74.8 kg   Height:        Examination:  General exam: Pleasant middle-aged female, moderately built and nourished lying comfortably propped up in bed.  Oral mucosa with borderline hydration.  Does not look septic or toxic. Respiratory system: Clear to auscultation. Respiratory effort normal.  Left PowerPort, site without acute findings. Cardiovascular system: S1 & S2 heard, RRR. No JVD, murmurs, rubs, gallops or clicks. No pedal edema.  Telemetry personally reviewed: SR- occasional ST in the low 100s. Gastrointestinal system: Abdomen is nondistended, soft and nontender. No organomegaly or masses felt. Normal bowel sounds heard. Central nervous system: Alert and oriented. No focal neurological deficits. Extremities: Symmetric 5 x 5 power.  No asymmetric lower extremity swelling. Skin: No rashes, lesions or ulcers Psychiatry: Judgement and insight appear normal. Mood & affect flat.     Data Reviewed: I have personally reviewed following labs and imaging studies  CBC: Recent Labs  Lab 03/23/2018 1400 03/25/18 0425  WBC 14.3* 11.7*  NEUTROABS 11.7*  --   HGB 10.2* 8.6*  HCT 31.1* 27.5*  MCV 92.3 92.9  PLT 156 376*   Basic Metabolic Panel: Recent Labs  Lab 02/27/2018 1400 03/25/18 0425  NA 134* 133*  K 3.2* 4.0  CL 89* 94*  CO2 27 29  GLUCOSE 108* 126*  BUN 15 11  CREATININE 0.71 0.49  CALCIUM 10.6* 9.9  MG 1.7  --    Liver Function Tests: Recent Labs  Lab 03/04/2018 1400 03/25/18 0425  AST 33 26  ALT 36 26  ALKPHOS 195* 168*  BILITOT 1.2 1.1  PROT 8.1 6.9  ALBUMIN 3.2* 2.7*   Coagulation Profile: Recent Labs  Lab 02/27/2018 1400  INR 1.31     Recent Results (from  the past 240 hour(s))  Culture, blood (routine x 2)     Status: None   Collection Time: 03/15/18  5:13 PM  Result Value Ref Range Status   Specimen Description   Final    BLOOD RIGHT ARM Performed at [facility] [address] [place], [place] [address]    Special Requests   Final    BOTTLES DRAWN AEROBIC AND ANAEROBIC Blood Culture adequate volume Performed at [facility] [address] [place], [place] [address]    Culture   Final    NO GROWTH 5 DAYS Performed at [facility], [place] [address] [place], [place] [address]    Report Status 03/20/2018 FINAL  Final  Culture, blood (routine x 2)     Status: None   Collection Time: 03/15/18  5:13 PM  Result Value Ref Range Status   Specimen Description BLOOD BLOOD RIGHT FOREARM  Final   Special Requests   Final    BOTTLES DRAWN AEROBIC AND ANAEROBIC Blood Culture adequate volume Performed at [facility] [address] [place], [place] [address]    Culture   Final    NO GROWTH 5 DAYS Performed at [facility], [place] [address] [place], [place] [address]  Report Status 03/20/2018 FINAL  Final  Culture, blood (Routine x 2)     Status: None (Preliminary result)   Collection Time: 03/03/2018  2:01 PM  Result Value Ref Range Status   Specimen Description   Final    BLOOD LEFT ANTECUBITAL Performed at [facility] [address], [place], [place] [address]    Special Requests   Final    BOTTLES DRAWN AEROBIC AND ANAEROBIC Blood Culture adequate volume Performed at [facility] [address], [place], [place] [address]    Culture   Final    NO GROWTH < 24 HOURS Performed at [facility] [address], [place], [place] [address]    Report Status PENDING  Incomplete  Culture, blood (Routine x 2)     Status: None (Preliminary result)   Collection Time: 02/25/2018  2:52 PM  Result Value Ref Range Status   Specimen  Description   Final    BLOOD LEFT CHEST Performed at [facility] [address], [place], [place] [address]    Special Requests   Final    BOTTLES DRAWN AEROBIC AND ANAEROBIC Blood Culture adequate volume Performed at [facility] [address] [place], [place] [address]    Culture   Final    NO GROWTH < 24 HOURS Performed at [facility] [address], [place], [place] [address]    Report Status PENDING  Incomplete  Respiratory Panel by PCR     Status: None   Collection Time: 03/25/18 12:34 AM  Result Value Ref Range Status   Adenovirus NOT DETECTED NOT DETECTED Final   Coronavirus 229E NOT DETECTED NOT DETECTED Final   Coronavirus HKU1 NOT DETECTED NOT DETECTED Final   Coronavirus NL63 NOT DETECTED NOT DETECTED Final   Coronavirus OC43 NOT DETECTED NOT DETECTED Final   Metapneumovirus NOT DETECTED NOT DETECTED Final   Rhinovirus / Enterovirus NOT DETECTED NOT DETECTED Final   Influenza A NOT DETECTED NOT DETECTED Final   Influenza B NOT DETECTED NOT DETECTED Final   Parainfluenza Virus 1 NOT DETECTED NOT DETECTED Final   Parainfluenza Virus 2 NOT DETECTED NOT DETECTED Final   Parainfluenza Virus 3 NOT DETECTED NOT DETECTED Final   Parainfluenza Virus 4 NOT DETECTED NOT DETECTED Final   Respiratory Syncytial Virus NOT DETECTED NOT DETECTED Final   Bordetella pertussis NOT DETECTED NOT DETECTED Final   Chlamydophila pneumoniae NOT DETECTED NOT DETECTED Final   Mycoplasma pneumoniae NOT DETECTED NOT DETECTED Final    Comment: Performed at [facility], [place] [address], [place],  [address]         Radiology Studies: Dg Chest 2 View  Result Date: 03/14/2018 CLINICAL DATA:  Weakness and confusion. EXAM: CHEST - 2 VIEW COMPARISON:  03/13/2018.  09/24/2017.  CT 12/22/2017. FINDINGS: PowerPort catheter with tip over superior vena cava. Heart size normal. Radiopacities noted over the ribs bilaterally are again noted  and most consistent with healing fractures as noted on prior chest x-ray of 03/13/2018 and CT 12/22/2017. Small bilateral pleural effusions. No pneumothorax. IMPRESSION: 1.  PowerPort catheter stable position. 2. Radiopacities previously noted over the ribs bilaterally are again noted and again are most consistent with healing fractures as noted on prior chest x-ray of 03/13/2018 and chest CT of 12/22/2017. Small bilateral pleural effusions. No pneumothorax. Electronically Signed   By: [place]   On: 03/17/2018 13:59        Scheduled Meds: .  enoxaparin (LOVENOX) injection  40 mg Subcutaneous Q24H  . fluticasone  2 spray Each Nare Daily  . labetalol  300 mg Oral BID  . loratadine  10 mg Oral Daily   Continuous Infusions: . sodium chloride 100 mL/hr at 03/25/18 0653  . sodium chloride    . ceFEPime (MAXIPIME) IV 1 g (03/25/18 0513)  . vancomycin       LOS: 1 day     Vernell Leep, MD, FACP, [facility]. [facility] Pager [phone]  If 7PM-7AM, please contact night-coverage [email or web address] Password [email or web address] 03/25/2018, 1:44 PM

## 2018-03-26 ENCOUNTER — Inpatient Hospital Stay (HOSPITAL_COMMUNITY)
Admit: 2018-03-26 | Discharge: 2018-03-26 | Disposition: A | Discharge status: Home / Self Care | Payer: Medicare Other | Attending: Neurology | Admitting: Neurology

## 2018-03-26 ENCOUNTER — Other Ambulatory Visit: Payer: Self-pay | Admitting: Oncology

## 2018-03-26 ENCOUNTER — Inpatient Hospital Stay (HOSPITAL_COMMUNITY): Payer: Medicare Other

## 2018-03-26 ENCOUNTER — Encounter (HOSPITAL_COMMUNITY): Payer: Self-pay | Admitting: Radiology

## 2018-03-26 DIAGNOSIS — Z171 Estrogen receptor negative status [ER-]: Secondary | ICD-10-CM

## 2018-03-26 DIAGNOSIS — I10 Essential (primary) hypertension: Secondary | ICD-10-CM

## 2018-03-26 DIAGNOSIS — J9600 Acute respiratory failure, unspecified whether with hypoxia or hypercapnia: Secondary | ICD-10-CM

## 2018-03-26 DIAGNOSIS — I160 Hypertensive urgency: Secondary | ICD-10-CM

## 2018-03-26 DIAGNOSIS — J9601 Acute respiratory failure with hypoxia: Secondary | ICD-10-CM

## 2018-03-26 LAB — CBC WITH DIFFERENTIAL/PLATELET
Abs Immature Granulocytes: 0.24 10*3/uL — ABNORMAL HIGH (ref 0.00–0.07)
Basophils Absolute: 0 10*3/uL (ref 0.0–0.1)
Basophils Relative: 0 %
Eosinophils Absolute: 0 10*3/uL (ref 0.0–0.5)
Eosinophils Relative: 0 %
HCT: 31.2 % — ABNORMAL LOW (ref 36.0–46.0)
HEMOGLOBIN: 10 g/dL — AB (ref 12.0–15.0)
Immature Granulocytes: 2 %
LYMPHS PCT: 11 %
Lymphs Abs: 1.3 10*3/uL (ref 0.7–4.0)
MCH: 29 pg (ref 26.0–34.0)
MCHC: 32.1 g/dL (ref 30.0–36.0)
MCV: 90.4 fL (ref 80.0–100.0)
Monocytes Absolute: 0.7 10*3/uL (ref 0.1–1.0)
Monocytes Relative: 6 %
NEUTROS ABS: 10 10*3/uL — AB (ref 1.7–7.7)
Neutrophils Relative %: 81 %
Platelets: 161 10*3/uL (ref 150–400)
RBC: 3.45 MIL/uL — ABNORMAL LOW (ref 3.87–5.11)
RDW: 18.2 % — ABNORMAL HIGH (ref 11.5–15.5)
WBC: 12.2 10*3/uL — ABNORMAL HIGH (ref 4.0–10.5)
nRBC: 0.2 % (ref 0.0–0.2)

## 2018-03-26 LAB — URINE CULTURE: Culture: NO GROWTH

## 2018-03-26 LAB — COMPREHENSIVE METABOLIC PANEL
ALT: 24 U/L (ref 0–44)
AST: 25 U/L (ref 15–41)
Albumin: 3.1 g/dL — ABNORMAL LOW (ref 3.5–5.0)
Alkaline Phosphatase: 176 U/L — ABNORMAL HIGH (ref 38–126)
Anion gap: 15 (ref 5–15)
BILIRUBIN TOTAL: 1 mg/dL (ref 0.3–1.2)
BUN: 11 mg/dL (ref 6–20)
CHLORIDE: 81 mmol/L — AB (ref 98–111)
CO2: 31 mmol/L (ref 22–32)
Calcium: 10.5 mg/dL — ABNORMAL HIGH (ref 8.9–10.3)
Creatinine, Ser: 0.66 mg/dL (ref 0.44–1.00)
GFR calc Af Amer: >60 mL/min (ref >60)
GFR calc non Af Amer: >60 mL/min (ref >60)
Glucose, Bld: 112 mg/dL — ABNORMAL HIGH (ref 70–99)
POTASSIUM: 3.3 mmol/L — AB (ref 3.5–5.1)
Sodium: 127 mmol/L — ABNORMAL LOW (ref 135–145)
Total Protein: 8.1 g/dL (ref 6.5–8.1)

## 2018-03-26 LAB — LACTATE DEHYDROGENASE: LDH: 607 U/L — ABNORMAL HIGH (ref 98–192)

## 2018-03-26 LAB — BLOOD GAS, ARTERIAL
ACID-BASE EXCESS: 7.4 mmol/L — AB (ref 0.0–2.0)
Acid-Base Excess: 6.5 mmol/L — ABNORMAL HIGH (ref 0.0–2.0)
Bicarbonate: 29.2 mmol/L — ABNORMAL HIGH (ref 20.0–28.0)
Bicarbonate: 31.1 mmol/L — ABNORMAL HIGH (ref 20.0–28.0)
Drawn by: 51425
Drawn by: 25788
FIO2: 21
O2 Content: 2 L/min
O2 Saturation: 92.7 %
O2 Saturation: 97.3 %
PCO2 ART: 36 mmHg (ref 32.0–48.0)
PH ART: 7.496 — AB (ref 7.350–7.450)
Patient temperature: 99.3
Patient temperature: 98.6
pCO2 arterial: 40.6 mmHg (ref 32.0–48.0)
pH, Arterial: 7.521 — ABNORMAL HIGH (ref 7.350–7.450)
pO2, Arterial: 67 mmHg — ABNORMAL LOW (ref 83.0–108.0)
pO2, Arterial: 92.7 mmHg (ref 83.0–108.0)

## 2018-03-26 LAB — HIV ANTIBODY (ROUTINE TESTING W REFLEX): HIV Screen 4th Generation wRfx: NONREACTIVE

## 2018-03-26 LAB — LACTIC ACID, PLASMA: Lactic Acid, Venous: 1.6 mmol/L (ref 0.5–1.9)

## 2018-03-26 LAB — AMMONIA: Ammonia: 15 umol/L (ref 9–35)

## 2018-03-26 LAB — GLUCOSE, CAPILLARY: Glucose-Capillary: 127 mg/dL — ABNORMAL HIGH (ref 70–99)

## 2018-03-26 LAB — CK: Total CK: 28 U/L — ABNORMAL LOW (ref 38–234)

## 2018-03-26 LAB — C-REACTIVE PROTEIN: CRP: 30.7 mg/dL — ABNORMAL HIGH (ref <1.0)

## 2018-03-26 MED ORDER — POTASSIUM CHLORIDE 10 MEQ/100ML IV SOLN
10.0000 meq | INTRAVENOUS | Status: AC
  Administered 2018-03-26 (×4): 10 meq via INTRAVENOUS
  Filled 2018-03-26 (×4): qty 100

## 2018-03-26 MED ORDER — NICARDIPINE HCL IN NACL 20-0.86 MG/200ML-% IV SOLN
3.0000 mg/h | INTRAVENOUS | Status: DC
  Filled 2018-03-26: qty 200

## 2018-03-26 MED ORDER — SODIUM CHLORIDE 0.9 % IV SOLN
2.0000 g | INTRAVENOUS | Status: DC
  Administered 2018-03-26 – 2018-03-29 (×19): 2 g via INTRAVENOUS
  Filled 2018-03-26 (×2): qty 2000
  Filled 2018-03-26 (×2): qty 2
  Filled 2018-03-26: qty 2000
  Filled 2018-03-26 (×3): qty 2
  Filled 2018-03-26 (×3): qty 2000
  Filled 2018-03-26: qty 2
  Filled 2018-03-26: qty 2000
  Filled 2018-03-26: qty 2
  Filled 2018-03-26 (×3): qty 2000
  Filled 2018-03-26: qty 2
  Filled 2018-03-26 (×3): qty 2000
  Filled 2018-03-26: qty 2

## 2018-03-26 MED ORDER — SODIUM CHLORIDE (PF) 0.9 % IJ SOLN
INTRAMUSCULAR | Status: AC
  Filled 2018-03-26: qty 50

## 2018-03-26 MED ORDER — LABETALOL HCL 5 MG/ML IV SOLN
5.0000 mg | INTRAVENOUS | Status: DC | PRN
  Administered 2018-03-26 (×2): 5 mg via INTRAVENOUS
  Filled 2018-03-26 (×3): qty 4

## 2018-03-26 MED ORDER — ACETAMINOPHEN 650 MG RE SUPP: 650.0000 mg | Freq: Four times a day (QID) | RECTAL | Status: DC | PRN

## 2018-03-26 MED ORDER — SODIUM CHLORIDE 0.9 % IV SOLN
Freq: Once | INTRAVENOUS | Status: AC
  Administered 2018-03-26: 16:00:00 via INTRAVENOUS

## 2018-03-26 MED ORDER — LABETALOL HCL 5 MG/ML IV SOLN
10.0000 mg | INTRAVENOUS | Status: DC | PRN
  Administered 2018-03-26: 10 mg via INTRAVENOUS
  Filled 2018-03-26 (×2): qty 4

## 2018-03-26 MED ORDER — FUROSEMIDE 10 MG/ML IJ SOLN
INTRAMUSCULAR | Status: AC
  Filled 2018-03-26: qty 2

## 2018-03-26 MED ORDER — SODIUM CHLORIDE 0.9 % IV SOLN: INTRAVENOUS | Status: AC

## 2018-03-26 MED ORDER — ENOXAPARIN SODIUM 40 MG/0.4ML ~~LOC~~ SOLN
40.0000 mg | SUBCUTANEOUS | Status: DC
  Administered 2018-03-27 – 2018-03-28 (×2): 40 mg via SUBCUTANEOUS
  Filled 2018-03-26 (×2): qty 0.4

## 2018-03-26 MED ORDER — SODIUM CHLORIDE 0.9 % IV SOLN: Freq: Once | INTRAVENOUS | Status: DC

## 2018-03-26 MED ORDER — ASPIRIN EC 325 MG PO TBEC: 325.0000 mg | DELAYED_RELEASE_TABLET | Freq: Every day | ORAL | Status: DC

## 2018-03-26 MED ORDER — STROKE: EARLY STAGES OF RECOVERY BOOK
Freq: Once | Status: AC
  Administered 2018-03-26: 20:00:00
  Filled 2018-03-26: qty 1

## 2018-03-26 MED ORDER — FUROSEMIDE 10 MG/ML IJ SOLN
40.0000 mg | Freq: Once | INTRAMUSCULAR | Status: AC
  Administered 2018-03-26: 40 mg via INTRAVENOUS
  Filled 2018-03-26: qty 4

## 2018-03-26 MED ORDER — METOPROLOL TARTRATE 5 MG/5ML IV SOLN
INTRAVENOUS | Status: AC
  Filled 2018-03-26: qty 5

## 2018-03-26 MED ORDER — LABETALOL HCL 5 MG/ML IV SOLN
5.0000 mg | Freq: Once | INTRAVENOUS | Status: AC
  Administered 2018-03-26: 1 mg via INTRAVENOUS
  Filled 2018-03-26: qty 4

## 2018-03-26 MED ORDER — IOPAMIDOL (ISOVUE-370) INJECTION 76%
INTRAVENOUS | Status: AC
  Administered 2018-03-26: 100 mL
  Filled 2018-03-26: qty 100

## 2018-03-26 MED ORDER — LABETALOL HCL 5 MG/ML IV SOLN
20.0000 mg | INTRAVENOUS | Status: DC | PRN
  Administered 2018-03-26: 20 mg via INTRAVENOUS

## 2018-03-26 MED ORDER — LABETALOL HCL 5 MG/ML IV SOLN
20.0000 mg | INTRAVENOUS | Status: DC | PRN
  Administered 2018-03-27 (×2): 20 mg via INTRAVENOUS
  Filled 2018-03-26 (×2): qty 4

## 2018-03-26 NOTE — Progress Notes (Signed)
Subjective:  Obtunded  Antibiotics:  Anti-infectives (From admission, onward)   Start     Dose/Rate Route Frequency Ordered Stop   03/26/18 0930  ampicillin (OMNIPEN) 2 g in sodium chloride 0.9 % 100 mL IVPB    Note to Pharmacy:  May adjust dose if needed   2 g 300 mL/hr over 20 Minutes Intravenous Every 4 hours 03/26/18 0905     03/25/18 1200  vancomycin (VANCOCIN) 1,250 mg in sodium chloride 0.9 % 250 mL IVPB     1,250 mg 166.7 mL/hr over 90 Minutes Intravenous Every 24 hours 02/24/2018 1931     03/05/2018 2200  ceFEPIme (MAXIPIME) 1 g in sodium chloride 0.9 % 100 mL IVPB     1 g 200 mL/hr over 30 Minutes Intravenous Every 8 hours 03/21/2018 1839     03/15/2018 1445  ceFEPIme (MAXIPIME) 2 g in sodium chloride 0.9 % 100 mL IVPB     2 g 200 mL/hr over 30 Minutes Intravenous  Once 03/17/2018 1433 03/21/2018 1528   03/09/2018 1445  metroNIDAZOLE (FLAGYL) IVPB 500 mg  Status:  Discontinued     500 mg 100 mL/hr over 60 Minutes Intravenous Every 8 hours 03/02/2018 1433 03/17/2018 2003   03/14/2018 1445  vancomycin (VANCOCIN) IVPB 1000 mg/200 mL premix     1,000 mg 200 mL/hr over 60 Minutes Intravenous  Once 03/23/2018 1433 03/12/2018 1642      Medications: Scheduled Meds: . enoxaparin (LOVENOX) injection  40 mg Subcutaneous Q24H  . fluticasone  2 spray Each Nare Daily  . labetalol  300 mg Oral BID  . metoprolol tartrate       Continuous Infusions: . sodium chloride 1,000 mL (03/25/18 2302)  . sodium chloride 50 mL/hr at 03/26/18 1031  . sodium chloride    . ampicillin (OMNIPEN) IV Stopped (03/26/18 1251)  . ceFEPime (MAXIPIME) IV 1 g (03/26/18 3762)  . niCARDipine Stopped (03/26/18 1251)  . vancomycin 166.7 mL/hr at 03/25/18 1201   PRN Meds:.sodium chloride, acetaminophen, acetaminophen, labetalol, lidocaine-prilocaine, menthol-cetylpyridinium, senna-docusate, sodium chloride flush    Objective: Weight change:   Intake/Output Summary (Last 24 hours) at 03/26/2018 1343 Last data  filed at 03/25/2018 1700 Gross per 24 hour  Intake 620.83 ml  Output -  Net 620.83 ml   Blood pressure (!) 174/112, pulse (!) 101, temperature 98.4 F (36.9 C), temperature source Oral, resp. rate (!) 39, height 5\' 3"  (1.6 m), weight 74.8 kg, last menstrual period 05/29/2015, SpO2 99 %. Temp:  [98.2 F (36.8 C)-99 F (37.2 C)] 98.4 F (36.9 C) (12/01 0454) Pulse Rate:  [90-111] 101 (12/01 1100) Resp:  [19-52] 39 (12/01 1100) BP: (144-174)/(75-112) 174/112 (12/01 1100) SpO2:  [91 %-100 %] 99 % (12/01 1100)  Physical Exam: General: Obtunded being cleaned off by nursing staff HEENT: anicteric sclera, EOMI CVS tachycardic rate, normal  Chest: , no wheezing, no respiratory distress Abdomen: soft non-distended,  Extremities: no edema or deformity noted bilaterally Skin: Port not clearly overtly infected Neuro: nonfocal  CBC:    BMET Recent Labs    03/25/18 0425 03/26/18 1025  NA 133* 127*  K 4.0 3.3*  CL 94* 81*  CO2 29 31  GLUCOSE 126* 112*  BUN 11 11  CREATININE 0.49 0.66  CALCIUM 9.9 10.5*     Liver Panel  Recent Labs    03/25/18 0425 03/26/18 1025  PROT 6.9 8.1  ALBUMIN 2.7* 3.1*  AST 26 25  ALT 26 24  ALKPHOS  168* 176*  BILITOT 1.1 1.0       Sedimentation Rate No results for input(s): ESRSEDRATE in the last 72 hours. C-Reactive Protein Recent Labs    03/26/18 0336  CRP 30.7*    Micro Results: Recent Results (from the past 720 hour(s))  Culture, blood (Routine x 2)     Status: None   Collection Time: 03/13/18  6:02 PM  Result Value Ref Range Status   Specimen Description   Final    BLOOD BLOOD LEFT HAND Performed at [facility] [address], [place], [place] [address]    Special Requests   Final    BOTTLES DRAWN AEROBIC AND ANAEROBIC Blood Culture results may not be optimal due to an inadequate volume of blood received in culture bottles Performed at [facility] [address],  [place], [place] [address]    Culture   Final    NO GROWTH 5 DAYS Performed at [facility], [place] [address] [place], [place] [address]    Report Status 03/18/2018 FINAL  Final  Culture, blood (Routine x 2)     Status: None   Collection Time: 03/13/18  6:02 PM  Result Value Ref Range Status   Specimen Description   Final    BLOOD PORTA CATH Performed at [facility] [address] [place], [place] [address]    Special Requests   Final    BOTTLES DRAWN AEROBIC AND ANAEROBIC Blood Culture adequate volume Performed at [facility] [address], [place], [place] [address]    Culture   Final    NO GROWTH 5 DAYS Performed at [facility], [place] [address] [place], [place] [address]    Report Status 03/18/2018 FINAL  Final  Urine culture     Status: Abnormal   Collection Time: 03/14/18  4:51 AM  Result Value Ref Range Status   Specimen Description   Final    URINE, CLEAN CATCH Performed at [facility], [place] [address], [place], [place] [address]    Special Requests   Final    NONE Performed at [facility], [place] [address] [place],  [address]    Culture (A)  Final    <10,000 COLONIES/mL INSIGNIFICANT GROWTH Performed at [facility] [address], [place],  [address]    Report Status 03/15/2018 FINAL  Final  Respiratory Panel by PCR     Status: None   Collection Time: 03/15/18 10:19 AM  Result Value Ref Range Status   Adenovirus NOT DETECTED NOT DETECTED Final   Coronavirus 229E NOT DETECTED NOT DETECTED Final   Coronavirus HKU1 NOT DETECTED NOT DETECTED Final   Coronavirus NL63 NOT DETECTED NOT DETECTED Final   Coronavirus OC43 NOT DETECTED NOT DETECTED Final   Metapneumovirus NOT DETECTED NOT DETECTED Final   Rhinovirus / Enterovirus NOT DETECTED NOT DETECTED Final   Influenza A NOT DETECTED NOT DETECTED Final   Influenza B NOT DETECTED NOT DETECTED Final    Parainfluenza Virus 1 NOT DETECTED NOT DETECTED Final   Parainfluenza Virus 2 NOT DETECTED NOT DETECTED Final   Parainfluenza Virus 3 NOT DETECTED NOT DETECTED Final   Parainfluenza Virus 4 NOT DETECTED NOT DETECTED Final   Respiratory Syncytial Virus NOT DETECTED NOT DETECTED Final   Bordetella pertussis NOT DETECTED NOT DETECTED Final   Chlamydophila pneumoniae NOT DETECTED NOT DETECTED Final   Mycoplasma pneumoniae NOT DETECTED NOT DETECTED Final  Comment: Performed at [facility], [place] [address] [place], [place] [address]  Culture, blood (routine x 2)     Status: None   Collection Time: 03/15/18  5:13 PM  Result Value Ref Range Status   Specimen Description   Final    BLOOD RIGHT ARM Performed at [facility] [address] [place], [place] [address]    Special Requests   Final    BOTTLES DRAWN AEROBIC AND ANAEROBIC Blood Culture adequate volume Performed at [facility] [address] [place], [place] [address]    Culture   Final    NO GROWTH 5 DAYS Performed at [facility], [place] [address] [place], [place] [address]    Report Status 03/20/2018 FINAL  Final  Culture, blood (routine x 2)     Status: None   Collection Time: 03/15/18  5:13 PM  Result Value Ref Range Status   Specimen Description BLOOD BLOOD RIGHT FOREARM  Final   Special Requests   Final    BOTTLES DRAWN AEROBIC AND ANAEROBIC Blood Culture adequate volume Performed at [facility] [address] [place], [place] [address]    Culture   Final    NO GROWTH 5 DAYS Performed at [facility], [place] [address] [place], [place] [address]    Report Status 03/20/2018 FINAL  Final  Culture, blood (Routine x 2)     Status: None (Preliminary result)   Collection Time: 02/28/2018  2:01 PM  Result Value Ref Range Status   Specimen Description   Final    BLOOD LEFT ANTECUBITAL Performed at [facility] [address], [place], [place] [address]    Special Requests   Final    BOTTLES DRAWN AEROBIC AND ANAEROBIC Blood Culture adequate volume Performed at [facility] [address] [place], [place] [address]    Culture   Final    NO GROWTH 2 DAYS Performed at [facility] [address] [place], [place] [address]    Report Status PENDING  Incomplete  Culture, blood (Routine x 2)     Status: None (Preliminary result)   Collection Time: 03/13/2018  2:52 PM  Result Value Ref Range Status   Specimen Description   Final    BLOOD LEFT CHEST Performed at [facility] [address] [place], [place] [address]    Special Requests   Final    BOTTLES DRAWN AEROBIC AND ANAEROBIC Blood Culture adequate volume Performed at [facility] [address] [place], [place] [address]    Culture   Final    NO GROWTH 2 DAYS Performed at [facility] [address], [place], [place] [address]    Report Status PENDING  Incomplete  Urine culture     Status: None   Collection Time: 03/03/2018  7:16 PM  Result Value Ref Range Status   Specimen Description   Final    URINE, CLEAN CATCH Performed at [facility], [place] [address] [place], [place] [address]    Special Requests   Final    NONE Performed at [facility], [place] [address], [place], [place] [address]    Culture   Final    NO GROWTH Performed at [facility], [place] [address], [place], [place] [address]    Report Status 03/26/2018 FINAL  Final  Respiratory Panel by PCR  Status: None   Collection Time: 03/25/18 12:34 AM  Result Value Ref Range Status   Adenovirus NOT DETECTED NOT DETECTED Final   Coronavirus 229E NOT DETECTED NOT DETECTED Final   Coronavirus HKU1 NOT DETECTED NOT DETECTED Final   Coronavirus NL63 NOT DETECTED NOT DETECTED Final   Coronavirus OC43 NOT DETECTED NOT DETECTED Final   Metapneumovirus NOT DETECTED NOT DETECTED  Final   Rhinovirus / Enterovirus NOT DETECTED NOT DETECTED Final   Influenza A NOT DETECTED NOT DETECTED Final   Influenza B NOT DETECTED NOT DETECTED Final   Parainfluenza Virus 1 NOT DETECTED NOT DETECTED Final   Parainfluenza Virus 2 NOT DETECTED NOT DETECTED Final   Parainfluenza Virus 3 NOT DETECTED NOT DETECTED Final   Parainfluenza Virus 4 NOT DETECTED NOT DETECTED Final   Respiratory Syncytial Virus NOT DETECTED NOT DETECTED Final   Bordetella pertussis NOT DETECTED NOT DETECTED Final   Chlamydophila pneumoniae NOT DETECTED NOT DETECTED Final   Mycoplasma pneumoniae NOT DETECTED NOT DETECTED Final    Comment: Performed at [facility], [place] [address], [place], [place] [address]    Studies/Results: Dg Chest 2 View  Result Date: 03/21/2018 CLINICAL DATA:  Weakness and confusion. EXAM: CHEST - 2 VIEW COMPARISON:  03/13/2018.  09/24/2017.  CT 12/22/2017. FINDINGS: PowerPort catheter with tip over superior vena cava. Heart size normal. Radiopacities noted over the ribs bilaterally are again noted and most consistent with healing fractures as noted on prior chest x-ray of 03/13/2018 and CT 12/22/2017. Small bilateral pleural effusions. No pneumothorax. IMPRESSION: 1.  PowerPort catheter stable position. 2. Radiopacities previously noted over the ribs bilaterally are again noted and again are most consistent with healing fractures as noted on prior chest x-ray of 03/13/2018 and chest CT of 12/22/2017. Small bilateral pleural effusions. No pneumothorax. Electronically Signed   By: Marcello Moores  Register   On: 02/28/2018 13:59   Ct Head Wo Contrast  Result Date: 03/26/2018 CLINICAL DATA:  Metastatic breast cancer, fever, rapid response, altered mental status EXAM: CT HEAD WITHOUT CONTRAST TECHNIQUE: Contiguous axial images were obtained from the base of the skull through the vertex without intravenous contrast. COMPARISON:  09/17/2017 FINDINGS: Brain: Diffuse patchy white matter hypoattenuation  bilaterally throughout both cerebral hemispheres compatible with chronic white matter microvascular changes, slight progression compared to the prior study. No acute intracranial hemorrhage, midline shift, large mass lesion, definite new infarction, or extra-axial fluid collection. No focal mass effect or edema. Cisterns are patent. No gross cerebellar abnormality. Exam is limited with some motion artifact. Vascular: No hyperdense vessel or unexpected calcification. Skull: Previously described tiny lucent areas throughout the skull are less apparent. Patient does have a history of osseous metastatic disease. No displaced or depressed fracture. Sinuses/Orbits: Orbits are symmetric. Minor right maxillary sinus mucosal thickening. Otherwise sinuses and mastoids are clear. Other: None. IMPRESSION: Limited with motion artifact and without IV contrast. This limits evaluation for intracranial metastases. Slight progression of white matter microvascular changes. No acute intracranial hemorrhage or mass effect. Electronically Signed   By: Jerilynn Mages.  Shick M.D.   On: 03/26/2018 08:51   Ct Chest W Contrast  Result Date: 03/25/2018 CLINICAL DATA:  Fever of unknown origin.  Stage 4 breast cancer. EXAM: CT CHEST, ABDOMEN, AND PELVIS WITH CONTRAST TECHNIQUE: Multidetector CT imaging of the chest, abdomen and pelvis was performed following the standard protocol during bolus administration of intravenous contrast. CONTRAST:  170mL OMNIPAQUE IOHEXOL 300 MG/ML SOLN, 48mL OMNIPAQUE IOHEXOL 300 MG/ML SOLN COMPARISON:  CT chest 12/22/2017 and  CT AP 09/19/2017. FINDINGS: CT CHEST FINDINGS Cardiovascular: The heart size appears normal. No pericardial effusion. Mediastinum/Nodes: Right axillary lymph node measures 1.2 cm, image 21/3. Previously 1.1 cm. No supraclavicular adenopathy. No enlarged left axillary lymph nodes. Normal appearance of the thyroid gland. The trachea appears patent and is midline. Small hiatal hernia noted. No  mediastinal or hilar adenopathy. Lungs/Pleura: Bilateral pleural effusions identified, increased from previous exam. There is no airspace consolidation identified. Left upper lobe lung nodule measures 6 mm and is unchanged, image 61/5. No additional pulmonary nodules noted. Musculoskeletal: There is diffuse skin thickening overlying the right breast. Right breast mass is again identified containing clip measuring 2.1 cm, image 28/3. Previously 2.6 cm. Mixed lytic and sclerotic bone metastases are identified throughout the visualized axial and appendicular skeleton. This appears progressive when compared with the previous exam. For example, within the proximal body of sternum there is near complete loss of the anterior cortex, image 82/7. New from previous study. Lucent lesion within the T10 vertebral body measures 1.1 cm, image 84/7. Previously 0.5 cm. Lucent lesion involving the T9 vertebra with pathologic fracture measures 1.2 cm, image 90/7. Previously 0.4 cm. T3 lucent lesion measures 1.7 cm, image 84/7. New from previous exam. CT ABDOMEN PELVIS FINDINGS Hepatobiliary: There are at least 5 ill-defined lucent lesions within the liver which are suspicious for metastatic disease. The largest is in segment 7 measuring 2.2 cm. Not previously characterized. Gallbladder normal. No biliary dilatation. Pancreas: Unremarkable. No pancreatic ductal dilatation or surrounding inflammatory changes. Spleen: Normal in size without focal abnormality. Adrenals/Urinary Tract: Adrenal glands are unremarkable. Kidneys are normal, without renal calculi, focal lesion, or hydronephrosis. Bladder is unremarkable. Stomach/Bowel: Small hiatal hernia. The small bowel loops have a normal course and caliber. No obstruction. No pathologic dilatation of the colon. Similar appearance of the appendix which contains gas within its lumen measuring 8 mm in diameter. Mild periappendiceal haziness noted, image 71/6. No pathologic dilatation of the  colon. Vascular/Lymphatic: Normal appearance of the abdominal aorta. No enlarged abdominopelvic lymph nodes. Reproductive: Partially calcified and enlarged fibroid uterus noted. Other: Small amount of free fluid noted within the pelvis. Musculoskeletal: Extensive lytic bone metastases are identified. This has increased when compared with the previous exam. New pathologic fracture involves the L4 vertebra. IMPRESSION: 1. No highly specific findings identified to indicate patient's fever of unknown origin. The appendix is visualized and mildly increased in caliber measuring 8 mm. There is mild periappendiceal haziness is well. No significant periappendiceal free fluid identified. Correlation for any clinical signs or symptoms of appendicitis advised. 2. There has been interval progression of diffuse lytic bone metastases with new pathologic fractures involving T9 and L4 vertebra. 3. Multiple low-attenuation lesions within the liver, worrisome for metastatic disease. Not previously characterized. Electronically Signed   By: Kerby Moors M.D.   On: 03/25/2018 18:17   Ct Abdomen Pelvis W Contrast  Result Date: 03/25/2018 CLINICAL DATA:  Fever of unknown origin.  Stage 4 breast cancer. EXAM: CT CHEST, ABDOMEN, AND PELVIS WITH CONTRAST TECHNIQUE: Multidetector CT imaging of the chest, abdomen and pelvis was performed following the standard protocol during bolus administration of intravenous contrast. CONTRAST:  156mL OMNIPAQUE IOHEXOL 300 MG/ML SOLN, 76mL OMNIPAQUE IOHEXOL 300 MG/ML SOLN COMPARISON:  CT chest 12/22/2017 and CT AP 09/19/2017. FINDINGS: CT CHEST FINDINGS Cardiovascular: The heart size appears normal. No pericardial effusion. Mediastinum/Nodes: Right axillary lymph node measures 1.2 cm, image 21/3. Previously 1.1 cm. No supraclavicular adenopathy. No enlarged left axillary lymph nodes. Normal appearance  of the thyroid gland. The trachea appears patent and is midline. Small hiatal hernia noted. No  mediastinal or hilar adenopathy. Lungs/Pleura: Bilateral pleural effusions identified, increased from previous exam. There is no airspace consolidation identified. Left upper lobe lung nodule measures 6 mm and is unchanged, image 61/5. No additional pulmonary nodules noted. Musculoskeletal: There is diffuse skin thickening overlying the right breast. Right breast mass is again identified containing clip measuring 2.1 cm, image 28/3. Previously 2.6 cm. Mixed lytic and sclerotic bone metastases are identified throughout the visualized axial and appendicular skeleton. This appears progressive when compared with the previous exam. For example, within the proximal body of sternum there is near complete loss of the anterior cortex, image 82/7. New from previous study. Lucent lesion within the T10 vertebral body measures 1.1 cm, image 84/7. Previously 0.5 cm. Lucent lesion involving the T9 vertebra with pathologic fracture measures 1.2 cm, image 90/7. Previously 0.4 cm. T3 lucent lesion measures 1.7 cm, image 84/7. New from previous exam. CT ABDOMEN PELVIS FINDINGS Hepatobiliary: There are at least 5 ill-defined lucent lesions within the liver which are suspicious for metastatic disease. The largest is in segment 7 measuring 2.2 cm. Not previously characterized. Gallbladder normal. No biliary dilatation. Pancreas: Unremarkable. No pancreatic ductal dilatation or surrounding inflammatory changes. Spleen: Normal in size without focal abnormality. Adrenals/Urinary Tract: Adrenal glands are unremarkable. Kidneys are normal, without renal calculi, focal lesion, or hydronephrosis. Bladder is unremarkable. Stomach/Bowel: Small hiatal hernia. The small bowel loops have a normal course and caliber. No obstruction. No pathologic dilatation of the colon. Similar appearance of the appendix which contains gas within its lumen measuring 8 mm in diameter. Mild periappendiceal haziness noted, image 71/6. No pathologic dilatation of the  colon. Vascular/Lymphatic: Normal appearance of the abdominal aorta. No enlarged abdominopelvic lymph nodes. Reproductive: Partially calcified and enlarged fibroid uterus noted. Other: Small amount of free fluid noted within the pelvis. Musculoskeletal: Extensive lytic bone metastases are identified. This has increased when compared with the previous exam. New pathologic fracture involves the L4 vertebra. IMPRESSION: 1. No highly specific findings identified to indicate patient's fever of unknown origin. The appendix is visualized and mildly increased in caliber measuring 8 mm. There is mild periappendiceal haziness is well. No significant periappendiceal free fluid identified. Correlation for any clinical signs or symptoms of appendicitis advised. 2. There has been interval progression of diffuse lytic bone metastases with new pathologic fractures involving T9 and L4 vertebra. 3. Multiple low-attenuation lesions within the liver, worrisome for metastatic disease. Not previously characterized. Electronically Signed   By: Kerby Moors M.D.   On: 03/25/2018 18:17   Dg Chest Port 1 View  Result Date: 03/26/2018 CLINICAL DATA:  Increased rate of breathing. EXAM: PORTABLE CHEST 1 VIEW COMPARISON:  CT yesterday. Radiograph 03/17/2018 FINDINGS: Left chest port tip in the SVC. Bilateral pleural effusions and adjacent atelectasis, more apparent on the left and progressed from prior radiograph. Normal heart size and mediastinal contours. Mild vascular congestion without overt pulmonary edema. No pneumothorax. Again seen bilateral rib fractures, some with have callus formation. IMPRESSION: Vascular congestion. Bilateral pleural effusions and adjacent atelectasis, more apparent on the left, likely increased from radiograph 2 days ago. Electronically Signed   By: Keith Rake M.D.   On: 03/26/2018 06:19      Assessment/Plan:  INTERVAL HISTORY: Patient with progressive neurological decline   Active Problems:    Essential hypertension   Anemia   Hypercalcemia   Breast cancer ([place])   Port-A-Cath in place  SIRS (systemic inflammatory response syndrome) ([place])   Sepsis ([place])    Olivia Patel is a 57 y.o. female with  PMH of metastatic breast cancer with bone mets, completed cycle 6 weekly Taxol/carboplatin on 03/03/2018, no evidence of disease progression as per last oncology office visit 03/03/2018, plans to refer to general surgery to consider partial or full right breast mastectomy for remaining mass. she had developed hypercalcemia, , anemia secondary to metastatic breast cancer and chemotherapy, hospitalized for fever 08/2017 without clear cause. She had  recent hospitalization 03/13/2018-03/18/2018 when she presented with fever, sinus congestion and right eye pain, was managed forSIRS, extensive work-up including chest x-ray, flu panel & RSV panel PCR, blood cultures were negative, she was treated initially with IV vancomycin and cefepime, transitioned to oral Augmentin at discharge. She returned to  to [facility] ED on 03/06/2018 with complaints of chills and fevers (up to 101.5 F), generalized malaise, fatigue, body aches consistent withSIRSwithout clear source of infection.   We were consulted to assist with further work-up and in the interim she had declined when I examined her yesterday she was not able to answer many of my questions I did not seem to know who her oncologist was or that she had breast cancer.  She is declined further overnight and transferred to the intensive care unit.  I think covering for bacterial meningitis is reasonable and adding ampicillin to her current regimen would be the only antimicrobial that I think needs to be added.  Agree with lumbar puncture with CSF out sent for cell count and differential protein glucose and cultures.  I also agree with an MRI with gadolinium  Not have a high suspicion for herpes meningoencephalitis.  I do remain concerned that her  port may be the nidus of her infection and that this is been seeding her blood with bacteria and perhaps her CNS though she could also now have metastases to the CNS.   Dr. Baxter Flattery will take over the service tomorrow.   LOS: 2 days   Alcide Evener 03/26/2018, 1:43 PM

## 2018-03-26 NOTE — Progress Notes (Signed)
Called to eval pt. With respirations in the 45-50 range and is not responding to verbal stimuli.  Requested rapid response be called to eval pt. And requested ABG to be ordered.

## 2018-03-26 NOTE — Procedures (Signed)
History: 57 year old female being evaluated for aphasia  Sedation: None  Technique: This is a 21 channel routine scalp EEG performed at the bedside with bipolar and monopolar montages arranged in accordance to the international 10/20 system of electrode placement. One channel was dedicated to EKG recording.    Background: There is a posterior dominant rhythm of 9 Hz, but the background is poorly organized and consisting primarily of frontally predominant generalized irregular delta and theta activities.  No sleep was recorded.  Photic stimulation: Physiologic driving is not performed  EEG Abnormalities: 1) generalized irregular delta and theta activities  Clinical Interpretation: This EEG is consistent with a generalized nonspecific cerebral dysfunction (encephalopathy). There was no seizure or seizure predisposition recorded on this study. Please note that lack of epileptiform activity on EEG does not preclude the possibility of epilepsy.   Roland Rack, MD [facility] [phone]  If 7pm- 7am, please page neurology on call as listed in [place].

## 2018-03-26 NOTE — Progress Notes (Signed)
PROGRESS NOTE   Olivia Patel  SWF:[id number]    DOB: 1960/06/09    DOA: 03/17/2018  PCP: Lance Sell, NP   I have briefly reviewed patients previous medical records in [facility].  Brief Narrative:  57 year old female with PMH of metastatic breast cancer with bone mets, completed cycle 6 weekly Taxol/carboplatin on 03/03/2018, no evidence of disease progression as per last oncology office visit 03/03/2018, plans to refer to general surgery to consider partial or full right breast mastectomy for remaining mass, hypercalcemia, HTN, anemia secondary to metastatic breast cancer and chemotherapy, hospitalized for culture-negative fever 08/2017, recent hospitalization 03/13/2018-03/18/2018 when she presented with fever, sinus congestion and right eye pain, was managed for SIRS, extensive work-up including chest x-ray, flu panel & RSV panel PCR, blood cultures were negative, she was treated initially with IV vancomycin and cefepime, transitioned to oral Augmentin at discharge, presented again to [facility] long ED on 04/23/2018 with complaints of chills and fevers (up to 101.5 F), generalized malaise, fatigue, body aches consistent with SIRS without clear source of infection.  Reports sick contacts (adult daughter with URI symptoms and 58-year-old grandchild with recent diarrhea). Admitted for SIRS. ID consulted, CT C/A/P with contrast without clear source for sepsis.  Patient declined overnight 11/30, worsening mental status changes, tachypnea.  Transferred to ICU 12/1 for close monitoring and management.  Neurology, CCM and oncology consulted.   Assessment & Plan:   Active Problems:   Essential hypertension   Anemia   Hypercalcemia   Breast cancer ([place])   Port-A-Cath in place   SIRS (systemic inflammatory response syndrome) (HCC)   Sepsis ([place])  Acute encephalopathy: Worsening mental status changes since 11/30 evening.  Unclear etiology.  Multiple DD's: FUO, meningoencephalitis-  infectious versus carcinomatous, brain mets, seizure disorder, hypertensive encephalopathy, medications versus other etiology.  Transferred to ICU for close monitoring.  Patient CT head without contrast: No acute findings.  Follow EEG results.  Minimized sedative medications/opioids.  Delirium precautions.  Neurology consulted for consultation and LP (culture and cytology). NPO until safe to feed.  Broadened antibiotic coverage for meningitis.  Ampicillin added to vancomycin and cefepime.  Will need MRI of brain with contrast when patient able to cooperate for study.  Monitor closely.  Ammonia and lactate normal  Tachypnea/acute respiratory failure with hypoxia:?  Central/neurogenic.  ABG: pH 7.521, PCO2 36, PO2 67, bicarbonate 29.2.  Although chest x-ray suggests pulmonary congestion, no clinical CHF.  Results of CT chest with contrast 11/30 noted.  Fever of unknown origin/SIRS, recurrent: She presented with fever/100.6, leukocytosis, tachycardia and tachypnea.  Unclear etiology.  Flu panel PCR x2 since 03/05/2018: Negative, RSV panel x2 since 03/15/2018: Negative, HIV screen 09/17/2017: Negative urine microscopy 11/29: Not indicative of UTI, blood cultures x2 from 11/20: Negative.  Blood cultures x2 from 11/29: Negative to date.  Urine culture 11/29: Negative.  Chest x-ray 03/11/2018 without acute findings.  Empirically started on IV cefepime and vancomycin. CT chest, abdomen and pelvis with IV and p.o. contrast without obvious source for fever. If negative then may have to consider removal of Port-A-Cath.  I discussed with ID, ampicillin added 12/1.  Hypercalcemia: Suspect due to dehydration.  Was 10.6 on admission, and about the same today/uncorrected.  Continue gentle IV fluids.  Received a dose of IV Lasix overnight although patient does not appear volume overloaded.  This degree of hypercalcemia less likely to cause mental status changes by itself but could contribute along with other etiology.?   Bisphosphonates.  Hypertensive urgency: Patient  unable to take p.o. safely.  Started IV Cardene drip.  When able to take orally safely, transition back to oral labetalol and adjust medications as needed.  Anemia of chronic disease: Related to metastatic breast cancer to bone and cancer chemotherapy.  Hemoglobin is dropped from 10.2 on admission to 8.6, some of it is possibly hemodilution.  Follow-up CBC in a.m.  Transfuse if hemoglobin 7 g or less.  Hemoglobin stable.  Thrombocytopenia: Unclear etiology.  Seems to have resolved.  Dehydration with hyponatremia: Sodium dropped from 133-127.  Received a dose of IV Lasix overnight.  Continue gentle IV saline hydration.  Follow BMP in a.m.  Hypokalemia: Replace and follow.  Metastatic breast cancer with bone metastasis: Consulted oncology on call.  Also need input regarding removal of Port-A-Cath as per ID recommendations.  DVT prophylaxis: Lovenox Code Status: Full Family Communication: Discussed in detail with patient's daughter, updated care and answered questions.  Advised her of critical nature of patient's illness and transferred to ICU 12/1. Disposition: DC home pending clinical improvement.   Consultants:  ID Medical oncology Neurology CCM  Procedures:  None  Antimicrobials:  IV cefepime and vancomycin 11/29 > IV ampicillin 12/1 >   Subjective: Overnight events noted.  I was paged at [phone] this morning with rapid response team at bedside.  I discussed in detail with rapid response RN, night RN, [facility] night coverage.  I subsequently discussed with patient daughter as well.  From what I was able to make out by chart review and speaking to medical staff and family, patient apparently was slightly confused when she came to the ED on 11/29.  She was coherent during my visit yesterday morning and when family visited her on 11/30 afternoon between 1-4 PM, she was not confused.  Subsequently during ID visit, patient noted to be confused  and unable to answer questions appropriately.  As per night RN report, progressive confusion and restlessness overnight, received a dose of IV Ativan.  Early this morning noted to be hypertensive, tachypneic but not hypoxic and rapid response activated.  On my evaluation at bedside, patient lethargic, intermittently opens eyes, nonverbal, restless, mildly tachypneic, moving all limbs symmetrically.  On one occasion noted disconjugate left eye gaze- laterally but no other seizure-like activity.  ROS: As above, otherwise negative.  Objective:  Vitals:   03/26/18 0728 03/26/18 1000 03/26/18 1032 03/26/18 1100  BP: (!) 161/99  (!) 173/108 (!) 174/112  Pulse:  (!) 111 (!) 111 (!) 101  Resp: 19 (!) 34 (!) 21 (!) 39  Temp:      TempSrc:      SpO2:   98% 99%  Weight:      Height:        Examination:  General exam: Pleasant middle-aged female, moderately built and nourished lying propped up in bed, looks worse than she did yesterday, slightly restless and fidgeting in bed but in no obvious pain.  Oral mucosa dry. Respiratory system: Clear to auscultation without wheezing, rhonchi or crackles.  Left PowerPort, site without acute findings.  Mildly tachypneic. Cardiovascular system: S1 & S2 heard, RRR. No JVD, murmurs, rubs, gallops or clicks. No pedal edema.  Telemetry personally reviewed: Sinus rhythm. Gastrointestinal system: Abdomen is nondistended, soft and nontender. No organomegaly or masses felt. Normal bowel sounds heard.  Stable. Central nervous system: Mental status as noted above. No focal neurological deficits.  An episode of transient disconjugate left eye lateral gaze. Extremities: Symmetric 5 x 5 power.  No asymmetric lower extremity swelling.  Skin: No rashes, lesions or ulcers Psychiatry: Judgement and insight impaired. Mood & affect unable to assess.     Data Reviewed: I have personally reviewed following labs and imaging studies  CBC: Recent Labs  Lab 03/21/2018 1400  03/25/18 0425 03/26/18 1025  WBC 14.3* 11.7* 12.2*  NEUTROABS 11.7*  --  10.0*  HGB 10.2* 8.6* 10.0*  HCT 31.1* 27.5* 31.2*  MCV 92.3 92.9 90.4  PLT 156 144* 660   Basic Metabolic Panel: Recent Labs  Lab 02/26/2018 1400 03/25/18 0425 03/26/18 1025  NA 134* 133* 127*  K 3.2* 4.0 3.3*  CL 89* 94* 81*  CO2 27 29 31   GLUCOSE 108* 126* 112*  BUN 15 11 11   CREATININE 0.71 0.49 0.66  CALCIUM 10.6* 9.9 10.5*  MG 1.7  --   --    Liver Function Tests: Recent Labs  Lab 03/15/2018 1400 03/25/18 0425 03/26/18 1025  AST 33 26 25  ALT 36 26 24  ALKPHOS 195* 168* 176*  BILITOT 1.2 1.1 1.0  PROT 8.1 6.9 8.1  ALBUMIN 3.2* 2.7* 3.1*   Coagulation Profile: Recent Labs  Lab 03/17/2018 1400  INR 1.31     Recent Results (from the past 240 hour(s))  Culture, blood (Routine x 2)     Status: None (Preliminary result)   Collection Time: 03/13/2018  2:01 PM  Result Value Ref Range Status   Specimen Description   Final    BLOOD LEFT ANTECUBITAL Performed at [facility] [address], [place], [place] [address]    Special Requests   Final    BOTTLES DRAWN AEROBIC AND ANAEROBIC Blood Culture adequate volume Performed at [facility] [address] [place], [place] [address]    Culture   Final    NO GROWTH 2 DAYS Performed at [facility] [address], [place], [place] [address]    Report Status PENDING  Incomplete  Culture, blood (Routine x 2)     Status: None (Preliminary result)   Collection Time: 03/06/2018  2:52 PM  Result Value Ref Range Status   Specimen Description   Final    BLOOD LEFT CHEST Performed at [facility] [address] [place], [place] [address]    Special Requests   Final    BOTTLES DRAWN AEROBIC AND ANAEROBIC Blood Culture adequate volume Performed at [facility] [address] [place], [place] [address]    Culture   Final    NO GROWTH 2 DAYS Performed at [facility] [address], [place], [place] [address]    Report Status PENDING  Incomplete  Urine culture     Status: None   Collection Time: 02/27/2018  7:16 PM  Result Value Ref Range Status   Specimen Description   Final    URINE, CLEAN CATCH Performed at [facility], [place] [address], [place], [place] [address]    Special Requests   Final    NONE Performed at [facility], [place] [address] [place], [place] [address]    Culture   Final    NO GROWTH Performed at [facility], [place] [address] [place],  [address]    Report Status 03/26/2018 FINAL  Final  Respiratory Panel by PCR     Status: None   Collection Time: 03/25/18 12:34 AM  Result Value Ref Range Status   Adenovirus NOT DETECTED NOT DETECTED Final   Coronavirus 229E NOT  DETECTED NOT DETECTED Final   Coronavirus HKU1 NOT DETECTED NOT DETECTED Final   Coronavirus NL63 NOT DETECTED NOT DETECTED Final   Coronavirus OC43 NOT DETECTED NOT DETECTED Final   Metapneumovirus NOT DETECTED NOT DETECTED Final   Rhinovirus / Enterovirus NOT DETECTED NOT DETECTED Final   Influenza A NOT DETECTED NOT DETECTED Final   Influenza B NOT DETECTED NOT DETECTED Final   Parainfluenza Virus 1 NOT DETECTED NOT DETECTED Final   Parainfluenza Virus 2 NOT DETECTED NOT DETECTED Final   Parainfluenza Virus 3 NOT DETECTED NOT DETECTED Final   Parainfluenza Virus 4 NOT DETECTED NOT DETECTED Final   Respiratory Syncytial Virus NOT DETECTED NOT DETECTED Final   Bordetella pertussis NOT DETECTED NOT DETECTED Final   Chlamydophila pneumoniae NOT DETECTED NOT DETECTED Final   Mycoplasma pneumoniae NOT DETECTED NOT DETECTED Final    Comment: Performed at [facility], [place] [address] [place], [place] [address]         Radiology Studies: Dg Chest 2 View  Result Date: 02/28/2018 CLINICAL DATA:  Weakness and confusion. EXAM: CHEST - 2 VIEW COMPARISON:  03/13/2018.  09/24/2017.  CT 12/22/2017.  FINDINGS: PowerPort catheter with tip over superior vena cava. Heart size normal. Radiopacities noted over the ribs bilaterally are again noted and most consistent with healing fractures as noted on prior chest x-ray of 03/13/2018 and CT 12/22/2017. Small bilateral pleural effusions. No pneumothorax. IMPRESSION: 1.  PowerPort catheter stable position. 2. Radiopacities previously noted over the ribs bilaterally are again noted and again are most consistent with healing fractures as noted on prior chest x-ray of 03/13/2018 and chest CT of 12/22/2017. Small bilateral pleural effusions. No pneumothorax. Electronically Signed   By: Marcello Moores  Register   On: 03/25/2018 13:59   Ct Head Wo Contrast  Result Date: 03/26/2018 CLINICAL DATA:  Metastatic breast cancer, fever, rapid response, altered mental status EXAM: CT HEAD WITHOUT CONTRAST TECHNIQUE: Contiguous axial images were obtained from the base of the skull through the vertex without intravenous contrast. COMPARISON:  09/17/2017 FINDINGS: Brain: Diffuse patchy white matter hypoattenuation bilaterally throughout both cerebral hemispheres compatible with chronic white matter microvascular changes, slight progression compared to the prior study. No acute intracranial hemorrhage, midline shift, large mass lesion, definite new infarction, or extra-axial fluid collection. No focal mass effect or edema. Cisterns are patent. No gross cerebellar abnormality. Exam is limited with some motion artifact. Vascular: No hyperdense vessel or unexpected calcification. Skull: Previously described tiny lucent areas throughout the skull are less apparent. Patient does have a history of osseous metastatic disease. No displaced or depressed fracture. Sinuses/Orbits: Orbits are symmetric. Minor right maxillary sinus mucosal thickening. Otherwise sinuses and mastoids are clear. Other: None. IMPRESSION: Limited with motion artifact and without IV contrast. This limits evaluation for  intracranial metastases. Slight progression of white matter microvascular changes. No acute intracranial hemorrhage or mass effect. Electronically Signed   By: Jerilynn Mages.  Shick M.D.   On: 03/26/2018 08:51   Ct Chest W Contrast  Result Date: 03/25/2018 CLINICAL DATA:  Fever of unknown origin.  Stage 4 breast cancer. EXAM: CT CHEST, ABDOMEN, AND PELVIS WITH CONTRAST TECHNIQUE: Multidetector CT imaging of the chest, abdomen and pelvis was performed following the standard protocol during bolus administration of intravenous contrast. CONTRAST:  134mL OMNIPAQUE IOHEXOL 300 MG/ML SOLN, 73mL OMNIPAQUE IOHEXOL 300 MG/ML SOLN COMPARISON:  CT chest 12/22/2017 and CT AP 09/19/2017. FINDINGS: CT CHEST FINDINGS Cardiovascular: The heart size appears normal. No pericardial effusion. Mediastinum/Nodes: Right axillary lymph node measures  1.2 cm, image 21/3. Previously 1.1 cm. No supraclavicular adenopathy. No enlarged left axillary lymph nodes. Normal appearance of the thyroid gland. The trachea appears patent and is midline. Small hiatal hernia noted. No mediastinal or hilar adenopathy. Lungs/Pleura: Bilateral pleural effusions identified, increased from previous exam. There is no airspace consolidation identified. Left upper lobe lung nodule measures 6 mm and is unchanged, image 61/5. No additional pulmonary nodules noted. Musculoskeletal: There is diffuse skin thickening overlying the right breast. Right breast mass is again identified containing clip measuring 2.1 cm, image 28/3. Previously 2.6 cm. Mixed lytic and sclerotic bone metastases are identified throughout the visualized axial and appendicular skeleton. This appears progressive when compared with the previous exam. For example, within the proximal body of sternum there is near complete loss of the anterior cortex, image 82/7. New from previous study. Lucent lesion within the T10 vertebral body measures 1.1 cm, image 84/7. Previously 0.5 cm. Lucent lesion involving the T9  vertebra with pathologic fracture measures 1.2 cm, image 90/7. Previously 0.4 cm. T3 lucent lesion measures 1.7 cm, image 84/7. New from previous exam. CT ABDOMEN PELVIS FINDINGS Hepatobiliary: There are at least 5 ill-defined lucent lesions within the liver which are suspicious for metastatic disease. The largest is in segment 7 measuring 2.2 cm. Not previously characterized. Gallbladder normal. No biliary dilatation. Pancreas: Unremarkable. No pancreatic ductal dilatation or surrounding inflammatory changes. Spleen: Normal in size without focal abnormality. Adrenals/Urinary Tract: Adrenal glands are unremarkable. Kidneys are normal, without renal calculi, focal lesion, or hydronephrosis. Bladder is unremarkable. Stomach/Bowel: Small hiatal hernia. The small bowel loops have a normal course and caliber. No obstruction. No pathologic dilatation of the colon. Similar appearance of the appendix which contains gas within its lumen measuring 8 mm in diameter. Mild periappendiceal haziness noted, image 71/6. No pathologic dilatation of the colon. Vascular/Lymphatic: Normal appearance of the abdominal aorta. No enlarged abdominopelvic lymph nodes. Reproductive: Partially calcified and enlarged fibroid uterus noted. Other: Small amount of free fluid noted within the pelvis. Musculoskeletal: Extensive lytic bone metastases are identified. This has increased when compared with the previous exam. New pathologic fracture involves the L4 vertebra. IMPRESSION: 1. No highly specific findings identified to indicate patient's fever of unknown origin. The appendix is visualized and mildly increased in caliber measuring 8 mm. There is mild periappendiceal haziness is well. No significant periappendiceal free fluid identified. Correlation for any clinical signs or symptoms of appendicitis advised. 2. There has been interval progression of diffuse lytic bone metastases with new pathologic fractures involving T9 and L4 vertebra. 3.  Multiple low-attenuation lesions within the liver, worrisome for metastatic disease. Not previously characterized. Electronically Signed   By: Kerby Moors M.D.   On: 03/25/2018 18:17   Ct Abdomen Pelvis W Contrast  Result Date: 03/25/2018 CLINICAL DATA:  Fever of unknown origin.  Stage 4 breast cancer. EXAM: CT CHEST, ABDOMEN, AND PELVIS WITH CONTRAST TECHNIQUE: Multidetector CT imaging of the chest, abdomen and pelvis was performed following the standard protocol during bolus administration of intravenous contrast. CONTRAST:  157mL OMNIPAQUE IOHEXOL 300 MG/ML SOLN, 30mL OMNIPAQUE IOHEXOL 300 MG/ML SOLN COMPARISON:  CT chest 12/22/2017 and CT AP 09/19/2017. FINDINGS: CT CHEST FINDINGS Cardiovascular: The heart size appears normal. No pericardial effusion. Mediastinum/Nodes: Right axillary lymph node measures 1.2 cm, image 21/3. Previously 1.1 cm. No supraclavicular adenopathy. No enlarged left axillary lymph nodes. Normal appearance of the thyroid gland. The trachea appears patent and is midline. Small hiatal hernia noted. No mediastinal or hilar adenopathy. Lungs/Pleura: Bilateral  pleural effusions identified, increased from previous exam. There is no airspace consolidation identified. Left upper lobe lung nodule measures 6 mm and is unchanged, image 61/5. No additional pulmonary nodules noted. Musculoskeletal: There is diffuse skin thickening overlying the right breast. Right breast mass is again identified containing clip measuring 2.1 cm, image 28/3. Previously 2.6 cm. Mixed lytic and sclerotic bone metastases are identified throughout the visualized axial and appendicular skeleton. This appears progressive when compared with the previous exam. For example, within the proximal body of sternum there is near complete loss of the anterior cortex, image 82/7. New from previous study. Lucent lesion within the T10 vertebral body measures 1.1 cm, image 84/7. Previously 0.5 cm. Lucent lesion involving the T9  vertebra with pathologic fracture measures 1.2 cm, image 90/7. Previously 0.4 cm. T3 lucent lesion measures 1.7 cm, image 84/7. New from previous exam. CT ABDOMEN PELVIS FINDINGS Hepatobiliary: There are at least 5 ill-defined lucent lesions within the liver which are suspicious for metastatic disease. The largest is in segment 7 measuring 2.2 cm. Not previously characterized. Gallbladder normal. No biliary dilatation. Pancreas: Unremarkable. No pancreatic ductal dilatation or surrounding inflammatory changes. Spleen: Normal in size without focal abnormality. Adrenals/Urinary Tract: Adrenal glands are unremarkable. Kidneys are normal, without renal calculi, focal lesion, or hydronephrosis. Bladder is unremarkable. Stomach/Bowel: Small hiatal hernia. The small bowel loops have a normal course and caliber. No obstruction. No pathologic dilatation of the colon. Similar appearance of the appendix which contains gas within its lumen measuring 8 mm in diameter. Mild periappendiceal haziness noted, image 71/6. No pathologic dilatation of the colon. Vascular/Lymphatic: Normal appearance of the abdominal aorta. No enlarged abdominopelvic lymph nodes. Reproductive: Partially calcified and enlarged fibroid uterus noted. Other: Small amount of free fluid noted within the pelvis. Musculoskeletal: Extensive lytic bone metastases are identified. This has increased when compared with the previous exam. New pathologic fracture involves the L4 vertebra. IMPRESSION: 1. No highly specific findings identified to indicate patient's fever of unknown origin. The appendix is visualized and mildly increased in caliber measuring 8 mm. There is mild periappendiceal haziness is well. No significant periappendiceal free fluid identified. Correlation for any clinical signs or symptoms of appendicitis advised. 2. There has been interval progression of diffuse lytic bone metastases with new pathologic fractures involving T9 and L4 vertebra. 3.  Multiple low-attenuation lesions within the liver, worrisome for metastatic disease. Not previously characterized. Electronically Signed   By: Kerby Moors M.D.   On: 03/25/2018 18:17   Dg Chest Port 1 View  Result Date: 03/26/2018 CLINICAL DATA:  Increased rate of breathing. EXAM: PORTABLE CHEST 1 VIEW COMPARISON:  CT yesterday. Radiograph 03/02/2018 FINDINGS: Left chest port tip in the SVC. Bilateral pleural effusions and adjacent atelectasis, more apparent on the left and progressed from prior radiograph. Normal heart size and mediastinal contours. Mild vascular congestion without overt pulmonary edema. No pneumothorax. Again seen bilateral rib fractures, some with have callus formation. IMPRESSION: Vascular congestion. Bilateral pleural effusions and adjacent atelectasis, more apparent on the left, likely increased from radiograph 2 days ago. Electronically Signed   By: Keith Rake M.D.   On: 03/26/2018 06:19        Scheduled Meds: . enoxaparin (LOVENOX) injection  40 mg Subcutaneous Q24H  . fluticasone  2 spray Each Nare Daily  . labetalol  300 mg Oral BID  . metoprolol tartrate       Continuous Infusions: . sodium chloride 1,000 mL (03/25/18 2302)  . sodium chloride 50 mL/hr at 03/26/18  1031  . sodium chloride    . ampicillin (OMNIPEN) IV Stopped (03/26/18 1251)  . ceFEPime (MAXIPIME) IV 1 g (03/26/18 8984)  . niCARDipine Stopped (03/26/18 1251)  . vancomycin 166.7 mL/hr at 03/25/18 1201     LOS: 2 days     Olivia Leep, MD, FACP, [facility]. [facility] Pager [phone]  If 7PM-7AM, please contact night-coverage [email or web address] Password [email or web address] 03/26/2018, 1:49 PM

## 2018-03-26 NOTE — Consult Note (Addendum)
NAME:  Olivia Patel, MRN:  [id number], DOB:  1961-04-05, LOS: 2 ADMISSION DATE:  03/15/2018, CONSULTATION DATE:  03/26/18 REFERRING MD:  Algis Liming, CHIEF COMPLAINT:  SIRS   Brief History   Patient with a history of metastatic breast cancer-advanced local disease with bone metastasis Presented with a few days of malaise, fatigue, fevers and chills  She was recently hospitalized 11/ 18-11/ 23-SIRS-treated with Augmentin Report of slurred speech prior to presentation  Has had similar episodes and evaluation for SIRS  Metastatic breast cancer completed cycle 6 of Taxol/carboplatin on 02/24/2018   History of present illness   Progressive symptoms led to presentation to the hospital According to family members, mental status is not altered at baseline Was not complaining of any other symptoms apart from what they noticed as decreased interaction and alteration in mental status  Past Medical History  Stage IV breast cancer with metastasis to the bone Hypertension Anemia  Significant Hospital Events   Encephalopathy  Consults:  Infectious disease Neurology  Procedures:   Significant Diagnostic Tests:  MRI pending CT scan of the chest  Micro Data:  Blood culture 12 /1-we will follow Blood culture 11/29-negative Urine culture 11/29-negative Urinalysis 11/29-unremarkable Respiratory viral panel negative  All cultures negative to date Antimicrobials:  Ampicillin 12 /1>> Cefepime11/29>> Metronidazole 11/29 Vancomycin 11/29  Interim history/subjective:  Does not follow commands Does not interact purposefully moving both upper extremities  Objective   Blood pressure (!) 174/112, pulse (!) 101, temperature 98.4 F (36.9 C), temperature source Oral, resp. rate (!) 39, height 5\' 3"  (1.6 m), weight 74.8 kg, last menstrual period 05/29/2015, SpO2 99 %.        Intake/Output Summary (Last 24 hours) at 03/26/2018 1212 Last data filed at 03/25/2018 1700 Gross per 24 hour    Intake 620.83 ml  Output -  Net 620.83 ml   Filed Weights   03/04/2018 1323 03/25/18 0501  Weight: 74.4 kg 74.8 kg    Examination: General: Elderly lady, does not appear to be in distress, tachypneic HENT: Dry oral mucosa Lungs: Decreased air movement bilaterally Cardiovascular: S1-S2 appreciated Abdomen: Soft, bowel sounds appreciated Extremities: No edema Neuro: Altered mental status   Resolved Hospital Problem list     Assessment & Plan:  Encephalopathy -Patient is medically altered -EEG was just performed-results pending -Not following commands -?meningitis ? Encephalitis  Tachypnea -Multifactorial -Rapid shallow breaths -May be related to Sirs/fever -We will follow arterial blood gas  Metastatic breast cancer -Advanced disease with bone metastases -Recently completed course of chemotherapy  Bilateral pleural effusions -Pleural effusions are small -Not contributing to current tachypnea -Thoracentesis if there is persistent fevers  Respiratory failure -Tachypnea -Secondary to underlying infection -Source of infection is unclear at the present time - Contraction alkalosis -Will give 250 cc of saline bolus -May be contracted with fevers and not taking orally -Fluid deficit  Best practice:  Diet: N.p.o. DVT prophylaxis: Lovenox Mobility: Bedrest Code Status: Full code-previous DNR status discussed with family Family Communication: Family at bedside Disposition:   Labs   CBC: Recent Labs  Lab 03/13/2018 1400 03/25/18 0425 03/26/18 1025  WBC 14.3* 11.7* 12.2*  NEUTROABS 11.7*  --  10.0*  HGB 10.2* 8.6* 10.0*  HCT 31.1* 27.5* 31.2*  MCV 92.3 92.9 90.4  PLT 156 144* 127    Basic Metabolic Panel: Recent Labs  Lab 03/15/2018 1400 03/25/18 0425 03/26/18 1025  NA 134* 133* 127*  K 3.2* 4.0 3.3*  CL 89* 94* 81*  CO2 27 29 31  GLUCOSE 108* 126* 112*  BUN 15 11 11   CREATININE 0.71 0.49 0.66  CALCIUM 10.6* 9.9 10.5*  MG 1.7  --   --     GFR: Estimated Creatinine Clearance: 77 mL/min (by C-G formula based on SCr of 0.66 mg/dL). Recent Labs  Lab 03/07/2018 1400 02/25/2018 1407 03/11/2018 1539 03/25/18 0425 03/26/18 1025  WBC 14.3*  --   --  11.7* 12.2*  LATICACIDVEN  --  1.51 1.24  --  1.6    Liver Function Tests: Recent Labs  Lab 02/25/2018 1400 03/25/18 0425 03/26/18 1025  AST 33 26 25  ALT 36 26 24  ALKPHOS 195* 168* 176*  BILITOT 1.2 1.1 1.0  PROT 8.1 6.9 8.1  ALBUMIN 3.2* 2.7* 3.1*   No results for input(s): LIPASE, AMYLASE in the last 168 hours. Recent Labs  Lab 03/26/18 1025  AMMONIA 15    ABG    Component Value Date/Time   PHART 7.521 (H) 03/26/2018 0507   PCO2ART 36.0 03/26/2018 0507   PO2ART 67.0 (L) 03/26/2018 0507   HCO3 29.2 (H) 03/26/2018 0507   O2SAT 92.7 03/26/2018 0507     Coagulation Profile: Recent Labs  Lab 02/24/2018 1400  INR 1.31    Cardiac Enzymes: Recent Labs  Lab 03/26/18 0336  CKTOTAL 28*    HbA1C: No results found for: HGBA1C  CBG: Recent Labs  Lab 03/26/18 0451  GLUCAP 127*    Review of Systems:   Review of Systems  Unable to perform ROS: Mental acuity    Past Medical History  She,  has a past medical history of Anemia, Arthritis, Cancer ([place]), and Hypertension.   Surgical History    Past Surgical History:  Procedure Laterality Date  . IR IMAGING GUIDED PORT INSERTION  10/25/2017  . TUBAL LIGATION       Social History   reports that she has never smoked. She has never used smokeless tobacco. She reports that she does not drink alcohol or use drugs.   Family History   Her family history includes Diabetes in her mother and sister; Hypertension in her father and mother; Kidney failure in her father.   Allergies No Known Allergies     Critical care time: 35 min

## 2018-03-26 NOTE — Progress Notes (Signed)
Addendum  Dr. Katherine Roan, Neurology called to discuss patient's case.  He advises that based on MRI brain and subsequent CTA head and neck, there is concern that patient may have had left MCA territory acute infarct with associated aphasia.  However infectious etiology i.e. meningoencephalitis not ruled out.  He will put in focus TIA orders.  He recommended stopping Cardene drip and treating BP only if >220/110.  He also recommends proceeding with LP.  Unfortunately after discussing with multiple MDs, unable to obtain LP until a.m. (IR to perform).  As per Dr. Leonel Ramsay, this still does not explain her tachypnea.  Discontinued Cardene drip, continue PRN IV labetalol for BP >220/110.  Discussed with RN, mental status somewhat better compared to this morning, smiling.  Vernell Leep, MD, FACP, [facility]. [facility] Pager [phone]  If 7PM-7AM, please contact night-coverage [email or web address] Password [email or web address] 03/26/2018, 5:17 PM '

## 2018-03-26 NOTE — Progress Notes (Signed)
STAT EEG completed; results pending, Dr Leonel Ramsay notified.

## 2018-03-26 NOTE — Consult Note (Addendum)
NEURO HOSPITALIST CONSULT NOTE   Requestig physician: Dr. Algis Liming   Reason for Consult:AMS   History obtained from:  Chart/family  HPI:                                                                                                                                          Olivia Patel is an 57 y.o. female HTN,  Stage IV breast cancer (metastasis to bones) who presented to [facility] for fever, chills, general malaise and found to have SIRS.   Per family she was normal yesterday. Following commands able to speak. Sometime since they left she has stopped following commands. She has brief periods where she seems to be herself, but still will not follow commands. 11/18-11/23 was hospitalized for SIRS with no clear source of infection. Treated with Vancomycin and cefepime. Was discharged and completed Augmentin course.  She reported that she has been feeling tired. Daughter states she has been having slurred speech.  Hospital course:  tmax- 100.6 Chest xray: small bilateral pleural effusions 12/1 respirations 45-50, rapid response called CTH: no hemorrhage or mass effect.  Past Medical History:  Diagnosis Date  . Anemia   . Arthritis   . Cancer ([place])   . Hypertension     Past Surgical History:  Procedure Laterality Date  . IR IMAGING GUIDED PORT INSERTION  10/25/2017  . TUBAL LIGATION      Family History  Problem Relation Age of Onset  . Hypertension Mother   . Diabetes Mother   . Diabetes Sister   . Hypertension Father   . Kidney failure Father    Social History:  reports that she has never smoked. She has never used smokeless tobacco. She reports that she does not drink alcohol or use drugs.  No Known Allergies  MEDICATIONS:                                                                                                                     Scheduled: . enoxaparin (LOVENOX) injection  40 mg Subcutaneous Q24H  . fluticasone  2 spray Each Nare Daily  .  furosemide      . labetalol  300 mg Oral BID  . metoprolol tartrate       Continuous: . sodium chloride 1,000 mL (03/25/18  2302)  . sodium chloride    . ampicillin (OMNIPEN) IV    . ceFEPime (MAXIPIME) IV 1 g (03/26/18 6195)  . vancomycin 166.7 mL/hr at 03/25/18 1201   KDT:OIZTIW chloride, acetaminophen, acetaminophen, labetalol, lidocaine-prilocaine, menthol-cetylpyridinium, senna-docusate, sodium chloride flush   ROS:                                                                                                                                        unobtainable from patient due to mental status Blood pressure (!) 161/99, pulse (!) 101, temperature 98.4 F (36.9 C), temperature source Oral, resp. rate 19, height 5\' 3"  (1.6 m), weight 74.8 kg, last menstrual period 05/29/2015, SpO2 91 %.   General Examination:                                                                                                       Physical Exam  HEENT-  Normocephalic, no lesions, without obvious abnormality.  Normal external eye and conjunctiva.   Cardiovascular- S1-S2 audible, pulses palpable throughout   Lungs-no rhonchi or wheezing noted, no excessive working breathing.  Saturations within normal limits on 2 L [place] Abdomen- All 4 quadrants palpated and nontender Extremities- Warm, dry and intact Musculoskeletal-no joint tenderness, deformity or swelling Skin-warm and dry, no hyperpigmentation, vitiligo, or suspicious lesions  Neurological Examination Mental Status: Drowsy, arouses to light touch, name calling. Does not follow commands. Will sometimes mumble "uh huh". bilaterally mitten restraints and agitated. Cranial Nerves: PERRL, gaze preference to the left, but eyes will cross midline to right not on command. No nystagmus noted. Will blink to threat from the left but not clearly from the right. Face appears symmetric Motor/Sensory: Moves all 4 extremities spontaneously with good strength  5/5. Tone and bulk:normal tone throughout; no atrophy noted Deep Tendon Reflexes: 2+ and symmetric throughout Plantars: Right: downgoing   Left: downgoing Cerebellar: UTA patient not following commands Gait: deferred   Lab Results: Basic Metabolic Panel: Recent Labs  Lab 03/16/2018 1400 03/25/18 0425  NA 134* 133*  K 3.2* 4.0  CL 89* 94*  CO2 27 29  GLUCOSE 108* 126*  BUN 15 11  CREATININE 0.71 0.49  CALCIUM 10.6* 9.9  MG 1.7  --     CBC: Recent Labs  Lab 03/23/2018 1400 03/25/18 0425  WBC 14.3* 11.7*  NEUTROABS 11.7*  --   HGB 10.2* 8.6*  HCT 31.1* 27.5*  MCV 92.3 92.9  PLT 156 144*    Cardiac Enzymes:  Recent Labs  Lab 03/26/18 0336  CKTOTAL 28*    Lipid Panel: No results for input(s): CHOL, TRIG, HDL, CHOLHDL, VLDL, LDLCALC in the last 168 hours.  Imaging: Dg Chest 2 View  Result Date: 03/03/2018 CLINICAL DATA:  Weakness and confusion. EXAM: CHEST - 2 VIEW COMPARISON:  03/13/2018.  09/24/2017.  CT 12/22/2017. FINDINGS: PowerPort catheter with tip over superior vena cava. Heart size normal. Radiopacities noted over the ribs bilaterally are again noted and most consistent with healing fractures as noted on prior chest x-ray of 03/13/2018 and CT 12/22/2017. Small bilateral pleural effusions. No pneumothorax. IMPRESSION: 1.  PowerPort catheter stable position. 2. Radiopacities previously noted over the ribs bilaterally are again noted and again are most consistent with healing fractures as noted on prior chest x-ray of 03/13/2018 and chest CT of 12/22/2017. Small bilateral pleural effusions. No pneumothorax. Electronically Signed   By: Marcello Moores  Register   On: 02/24/2018 13:59   Ct Head Wo Contrast  Result Date: 03/26/2018 CLINICAL DATA:  Metastatic breast cancer, fever, rapid response, altered mental status EXAM: CT HEAD WITHOUT CONTRAST TECHNIQUE: Contiguous axial images were obtained from the base of the skull through the vertex without intravenous contrast.  COMPARISON:  09/17/2017 FINDINGS: Brain: Diffuse patchy white matter hypoattenuation bilaterally throughout both cerebral hemispheres compatible with chronic white matter microvascular changes, slight progression compared to the prior study. No acute intracranial hemorrhage, midline shift, large mass lesion, definite new infarction, or extra-axial fluid collection. No focal mass effect or edema. Cisterns are patent. No gross cerebellar abnormality. Exam is limited with some motion artifact. Vascular: No hyperdense vessel or unexpected calcification. Skull: Previously described tiny lucent areas throughout the skull are less apparent. Patient does have a history of osseous metastatic disease. No displaced or depressed fracture. Sinuses/Orbits: Orbits are symmetric. Minor right maxillary sinus mucosal thickening. Otherwise sinuses and mastoids are clear. Other: None. IMPRESSION: Limited with motion artifact and without IV contrast. This limits evaluation for intracranial metastases. Slight progression of white matter microvascular changes. No acute intracranial hemorrhage or mass effect. Electronically Signed   By: Jerilynn Mages.  Shick M.D.   On: 03/26/2018 08:51   Ct Chest W Contrast  Result Date: 03/25/2018 CLINICAL DATA:  Fever of unknown origin.  Stage 4 breast cancer. EXAM: CT CHEST, ABDOMEN, AND PELVIS WITH CONTRAST TECHNIQUE: Multidetector CT imaging of the chest, abdomen and pelvis was performed following the standard protocol during bolus administration of intravenous contrast. CONTRAST:  172mL OMNIPAQUE IOHEXOL 300 MG/ML SOLN, 102mL OMNIPAQUE IOHEXOL 300 MG/ML SOLN COMPARISON:  CT chest 12/22/2017 and CT AP 09/19/2017. FINDINGS: CT CHEST FINDINGS Cardiovascular: The heart size appears normal. No pericardial effusion. Mediastinum/Nodes: Right axillary lymph node measures 1.2 cm, image 21/3. Previously 1.1 cm. No supraclavicular adenopathy. No enlarged left axillary lymph nodes. Normal appearance of the thyroid  gland. The trachea appears patent and is midline. Small hiatal hernia noted. No mediastinal or hilar adenopathy. Lungs/Pleura: Bilateral pleural effusions identified, increased from previous exam. There is no airspace consolidation identified. Left upper lobe lung nodule measures 6 mm and is unchanged, image 61/5. No additional pulmonary nodules noted. Musculoskeletal: There is diffuse skin thickening overlying the right breast. Right breast mass is again identified containing clip measuring 2.1 cm, image 28/3. Previously 2.6 cm. Mixed lytic and sclerotic bone metastases are identified throughout the visualized axial and appendicular skeleton. This appears progressive when compared with the previous exam. For example, within the proximal body of sternum there is near complete loss of the anterior cortex, image 82/7.  New from previous study. Lucent lesion within the T10 vertebral body measures 1.1 cm, image 84/7. Previously 0.5 cm. Lucent lesion involving the T9 vertebra with pathologic fracture measures 1.2 cm, image 90/7. Previously 0.4 cm. T3 lucent lesion measures 1.7 cm, image 84/7. New from previous exam. CT ABDOMEN PELVIS FINDINGS Hepatobiliary: There are at least 5 ill-defined lucent lesions within the liver which are suspicious for metastatic disease. The largest is in segment 7 measuring 2.2 cm. Not previously characterized. Gallbladder normal. No biliary dilatation. Pancreas: Unremarkable. No pancreatic ductal dilatation or surrounding inflammatory changes. Spleen: Normal in size without focal abnormality. Adrenals/Urinary Tract: Adrenal glands are unremarkable. Kidneys are normal, without renal calculi, focal lesion, or hydronephrosis. Bladder is unremarkable. Stomach/Bowel: Small hiatal hernia. The small bowel loops have a normal course and caliber. No obstruction. No pathologic dilatation of the colon. Similar appearance of the appendix which contains gas within its lumen measuring 8 mm in diameter.  Mild periappendiceal haziness noted, image 71/6. No pathologic dilatation of the colon. Vascular/Lymphatic: Normal appearance of the abdominal aorta. No enlarged abdominopelvic lymph nodes. Reproductive: Partially calcified and enlarged fibroid uterus noted. Other: Small amount of free fluid noted within the pelvis. Musculoskeletal: Extensive lytic bone metastases are identified. This has increased when compared with the previous exam. New pathologic fracture involves the L4 vertebra. IMPRESSION: 1. No highly specific findings identified to indicate patient's fever of unknown origin. The appendix is visualized and mildly increased in caliber measuring 8 mm. There is mild periappendiceal haziness is well. No significant periappendiceal free fluid identified. Correlation for any clinical signs or symptoms of appendicitis advised. 2. There has been interval progression of diffuse lytic bone metastases with new pathologic fractures involving T9 and L4 vertebra. 3. Multiple low-attenuation lesions within the liver, worrisome for metastatic disease. Not previously characterized. Electronically Signed   By: Kerby Moors M.D.   On: 03/25/2018 18:17   Ct Abdomen Pelvis W Contrast  Result Date: 03/25/2018 CLINICAL DATA:  Fever of unknown origin.  Stage 4 breast cancer. EXAM: CT CHEST, ABDOMEN, AND PELVIS WITH CONTRAST TECHNIQUE: Multidetector CT imaging of the chest, abdomen and pelvis was performed following the standard protocol during bolus administration of intravenous contrast. CONTRAST:  18mL OMNIPAQUE IOHEXOL 300 MG/ML SOLN, 69mL OMNIPAQUE IOHEXOL 300 MG/ML SOLN COMPARISON:  CT chest 12/22/2017 and CT AP 09/19/2017. FINDINGS: CT CHEST FINDINGS Cardiovascular: The heart size appears normal. No pericardial effusion. Mediastinum/Nodes: Right axillary lymph node measures 1.2 cm, image 21/3. Previously 1.1 cm. No supraclavicular adenopathy. No enlarged left axillary lymph nodes. Normal appearance of the thyroid  gland. The trachea appears patent and is midline. Small hiatal hernia noted. No mediastinal or hilar adenopathy. Lungs/Pleura: Bilateral pleural effusions identified, increased from previous exam. There is no airspace consolidation identified. Left upper lobe lung nodule measures 6 mm and is unchanged, image 61/5. No additional pulmonary nodules noted. Musculoskeletal: There is diffuse skin thickening overlying the right breast. Right breast mass is again identified containing clip measuring 2.1 cm, image 28/3. Previously 2.6 cm. Mixed lytic and sclerotic bone metastases are identified throughout the visualized axial and appendicular skeleton. This appears progressive when compared with the previous exam. For example, within the proximal body of sternum there is near complete loss of the anterior cortex, image 82/7. New from previous study. Lucent lesion within the T10 vertebral body measures 1.1 cm, image 84/7. Previously 0.5 cm. Lucent lesion involving the T9 vertebra with pathologic fracture measures 1.2 cm, image 90/7. Previously 0.4 cm. T3 lucent lesion measures  1.7 cm, image 84/7. New from previous exam. CT ABDOMEN PELVIS FINDINGS Hepatobiliary: There are at least 5 ill-defined lucent lesions within the liver which are suspicious for metastatic disease. The largest is in segment 7 measuring 2.2 cm. Not previously characterized. Gallbladder normal. No biliary dilatation. Pancreas: Unremarkable. No pancreatic ductal dilatation or surrounding inflammatory changes. Spleen: Normal in size without focal abnormality. Adrenals/Urinary Tract: Adrenal glands are unremarkable. Kidneys are normal, without renal calculi, focal lesion, or hydronephrosis. Bladder is unremarkable. Stomach/Bowel: Small hiatal hernia. The small bowel loops have a normal course and caliber. No obstruction. No pathologic dilatation of the colon. Similar appearance of the appendix which contains gas within its lumen measuring 8 mm in diameter.  Mild periappendiceal haziness noted, image 71/6. No pathologic dilatation of the colon. Vascular/Lymphatic: Normal appearance of the abdominal aorta. No enlarged abdominopelvic lymph nodes. Reproductive: Partially calcified and enlarged fibroid uterus noted. Other: Small amount of free fluid noted within the pelvis. Musculoskeletal: Extensive lytic bone metastases are identified. This has increased when compared with the previous exam. New pathologic fracture involves the L4 vertebra. IMPRESSION: 1. No highly specific findings identified to indicate patient's fever of unknown origin. The appendix is visualized and mildly increased in caliber measuring 8 mm. There is mild periappendiceal haziness is well. No significant periappendiceal free fluid identified. Correlation for any clinical signs or symptoms of appendicitis advised. 2. There has been interval progression of diffuse lytic bone metastases with new pathologic fractures involving T9 and L4 vertebra. 3. Multiple low-attenuation lesions within the liver, worrisome for metastatic disease. Not previously characterized. Electronically Signed   By: Kerby Moors M.D.   On: 03/25/2018 18:17   Dg Chest Port 1 View  Result Date: 03/26/2018 CLINICAL DATA:  Increased rate of breathing. EXAM: PORTABLE CHEST 1 VIEW COMPARISON:  CT yesterday. Radiograph 03/14/2018 FINDINGS: Left chest port tip in the SVC. Bilateral pleural effusions and adjacent atelectasis, more apparent on the left and progressed from prior radiograph. Normal heart size and mediastinal contours. Mild vascular congestion without overt pulmonary edema. No pneumothorax. Again seen bilateral rib fractures, some with have callus formation. IMPRESSION: Vascular congestion. Bilateral pleural effusions and adjacent atelectasis, more apparent on the left, likely increased from radiograph 2 days ago. Electronically Signed   By: Keith Rake M.D.   On: 03/26/2018 06:19    Assessment: HTN,  Stage IV  breast cancer (metastasis to bones) who presented to [facility] for fever, chills, general malaise and found to have SIRS. Patient not following commands. EEG to r/o seizure. MRI w/wo contrast to eval for mets to brain.  Recommendations: -ammonia, TSH, NA, BUN, UA -MRI Brain w/ wo contrast -EEG STAT  Laurey Morale, MSN, NP-C [facility] [phone]  Attending neurologist's note to follow   03/26/2018, 9:34 AM   I have seen the patient and reviewed the above note.  She developed confusion sometime yesterday evening, but was talking earlier in the day yesterday.  On my exam, she appears to have a right hemianopia and aphasia.  The MRI does not demonstrate a clear infarct, but there is decreased distal branches in the left MCA distribution.  Unfortunately, there is no vessel that would be amenable to intervention.  She is not a candidate for IV TPA because of being outside the window.  The MRI did not demonstrate definite infarct, but there is significant motion artifact I do think there is suggestion of a hyperintensity in the left temporal lobe.  I think that ischemic infarct is the most  likely etiology of her symptoms at this time, but given her infection and likely some degree of immune suppression from chemotherapy I think that a lumbar puncture is indicated at this time.   EEG does not demonstrate any seizure activity.  1) lumbar puncture for cells, glucose, protein, culture, cytology 2) I suspect that she will need sedation for her MRI given the lack of tolerance today. 3) if lumbar puncture is going to be delayed then empiric coverage for CNS infection 4) neurology will continue to follow  Roland Rack, MD [facility] [phone]  If 7pm- 7am, please page neurology on call as listed in [place].

## 2018-03-26 NOTE — Progress Notes (Signed)
Pt.was very high  Respiration 30-40,BP was high169/101 MEWS score was red and rapid respond was called.

## 2018-03-26 NOTE — Progress Notes (Signed)
Pt.was agitated and confused.Night hospitalist was contacted.

## 2018-03-26 NOTE — Significant Event (Signed)
Rapid Response Event Note  Overview: Time Called: 0451 Arrival Time: 0500 Event Type: Other (Comment)   Respiration 30-40,BP 169/101 MEWS score was red and rapid respond was called.  Initial Focused Assessment: pt placed on monitor (Temp. 99.3 ax, HR 102, sats 96% RA, B/P 175/94 and RR 35). Pt not f/c immediately but later denied pain and followed some simple commands.     Interventions: ABG and PCXR completed per protocol.  [facility], NP made aware and new order received and initiated see MAR.    Plan of Care (if not transferred): Pt taken to CT and transferred to ICU/SD d/t multiple attempts unable to stabile B/P nor RR.   Olivia Patel

## 2018-03-26 NOTE — Progress Notes (Addendum)
Pharmacy Antibiotic Note  Olivia Patel is a 57 y.o. female admitted on 03/16/2018 with sepsis. Recent hospitalization 11/18-11/23 and received IV vanc/cefepime which was transitioned to Augmentin at d/c. Pharmacy originally consulted for Vancomycin & Cefepime dosing. Pharmacy now consulted to add Ampicillin for possible meningitis.  Noted ID also following patient. Currently no clear source of infection.  Plan: Continue Cefepime 1gm q8 Continue 1250mg  q24 (AUC 477,IBW/ABW, Cr 0.71) Ampicillin 2gm IV q4h  Height: 5\' 3"  (160 cm) Weight: 164 lb 14.5 oz (74.8 kg) IBW/kg (Calculated) : 52.4  Temp (24hrs), Avg:98.5 F (36.9 C), Min:98.2 F (36.8 C), Max:99 F (37.2 C)  Recent Labs  Lab 03/16/2018 1400 03/10/2018 1407 03/04/2018 1539 03/25/18 0425  WBC 14.3*  --   --  11.7*  CREATININE 0.71  --   --  0.49  LATICACIDVEN  --  1.51 1.24  --     Estimated Creatinine Clearance: 77 mL/min (by C-G formula based on SCr of 0.49 mg/dL).    No Known Allergies  Antimicrobials this admission: 11/29 Cefepime >>  11/29 Vanc >>  11/29 Flagyl >> 12/1 Ampicillin>>  Dose adjustments this admission:  Microbiology results: 11/29 BCx: ngtd 11/29 UCx: sent 11/29 RSV panel: negative  Prev Cx: 11/20 BCx: ng-final 11/20 Resp PCR: neg 11/29 UCx: < 10K colonies 11/28 BCx: ng-final   Thank you for allowing pharmacy to be a part of this patient's care.  Sherlon Handing, PharmD, BCPS Clinical pharmacist 03/26/2018, 9:09 AM

## 2018-03-26 DEATH — deceased

## 2018-03-27 ENCOUNTER — Inpatient Hospital Stay (HOSPITAL_COMMUNITY): Payer: Medicare Other

## 2018-03-27 ENCOUNTER — Other Ambulatory Visit (HOSPITAL_COMMUNITY): Payer: Medicare Other

## 2018-03-27 ENCOUNTER — Inpatient Hospital Stay: Payer: Medicare Other | Admitting: Nurse Practitioner

## 2018-03-27 DIAGNOSIS — C787 Secondary malignant neoplasm of liver and intrahepatic bile duct: Secondary | ICD-10-CM

## 2018-03-27 DIAGNOSIS — I639 Cerebral infarction, unspecified: Secondary | ICD-10-CM

## 2018-03-27 DIAGNOSIS — C50919 Malignant neoplasm of unspecified site of unspecified female breast: Secondary | ICD-10-CM

## 2018-03-27 DIAGNOSIS — R509 Fever, unspecified: Secondary | ICD-10-CM

## 2018-03-27 DIAGNOSIS — R4701 Aphasia: Secondary | ICD-10-CM

## 2018-03-27 DIAGNOSIS — D849 Immunodeficiency, unspecified: Secondary | ICD-10-CM

## 2018-03-27 DIAGNOSIS — R569 Unspecified convulsions: Secondary | ICD-10-CM

## 2018-03-27 DIAGNOSIS — I82409 Acute embolism and thrombosis of unspecified deep veins of unspecified lower extremity: Secondary | ICD-10-CM

## 2018-03-27 DIAGNOSIS — R5081 Fever presenting with conditions classified elsewhere: Secondary | ICD-10-CM

## 2018-03-27 DIAGNOSIS — R063 Periodic breathing: Secondary | ICD-10-CM

## 2018-03-27 DIAGNOSIS — I6389 Other cerebral infarction: Secondary | ICD-10-CM

## 2018-03-27 DIAGNOSIS — G9340 Encephalopathy, unspecified: Secondary | ICD-10-CM

## 2018-03-27 LAB — POCT I-STAT 3, ART BLOOD GAS (G3+)
Acid-Base Excess: 10 mmol/L — ABNORMAL HIGH (ref 0.0–2.0)
Bicarbonate: 32.8 mmol/L — ABNORMAL HIGH (ref 20.0–28.0)
O2 Saturation: 99 %
PCO2 ART: 36.7 mmHg (ref 32.0–48.0)
TCO2: 34 mmol/L — ABNORMAL HIGH (ref 22–32)
pH, Arterial: 7.56 — ABNORMAL HIGH (ref 7.350–7.450)
pO2, Arterial: 131 mmHg — ABNORMAL HIGH (ref 83.0–108.0)

## 2018-03-27 LAB — CSF CELL COUNT WITH DIFFERENTIAL
RBC Count, CSF: 1 /mm3 — ABNORMAL HIGH
Tube #: 4
WBC, CSF: 21 /mm3 (ref 0–5)

## 2018-03-27 LAB — CBC
HCT: 27.2 % — ABNORMAL LOW (ref 36.0–46.0)
Hemoglobin: 8.6 g/dL — ABNORMAL LOW (ref 12.0–15.0)
MCH: 28.4 pg (ref 26.0–34.0)
MCHC: 31.6 g/dL (ref 30.0–36.0)
MCV: 89.8 fL (ref 80.0–100.0)
NRBC: 0 % (ref 0.0–0.2)
Platelets: 138 10*3/uL — ABNORMAL LOW (ref 150–400)
RBC: 3.03 MIL/uL — ABNORMAL LOW (ref 3.87–5.11)
RDW: 18 % — ABNORMAL HIGH (ref 11.5–15.5)
WBC: 12.2 10*3/uL — ABNORMAL HIGH (ref 4.0–10.5)

## 2018-03-27 LAB — BASIC METABOLIC PANEL
ANION GAP: 13 (ref 5–15)
BUN: 16 mg/dL (ref 6–20)
CALCIUM: 9.4 mg/dL (ref 8.9–10.3)
CO2: 30 mmol/L (ref 22–32)
Chloride: 85 mmol/L — ABNORMAL LOW (ref 98–111)
Creatinine, Ser: 0.57 mg/dL (ref 0.44–1.00)
GFR calc Af Amer: >60 mL/min (ref >60)
GFR calc non Af Amer: >60 mL/min (ref >60)
Glucose, Bld: 103 mg/dL — ABNORMAL HIGH (ref 70–99)
Potassium: 3.6 mmol/L (ref 3.5–5.1)
Sodium: 128 mmol/L — ABNORMAL LOW (ref 135–145)

## 2018-03-27 LAB — HEPATITIS PANEL, ACUTE
HCV Ab: 0.2 s/co ratio (ref 0.0–0.9)
Hep A IgM: NEGATIVE
Hep B C IgM: NEGATIVE
Hepatitis B Surface Ag: NEGATIVE

## 2018-03-27 LAB — EPSTEIN-BARR VIRUS VCA ANTIBODY PANEL
EBV Early Antigen Ab, IgG: <9 U/mL (ref 0.0–8.9)
EBV NA IgG: >600 U/mL — ABNORMAL HIGH (ref 0.0–17.9)
EBV VCA IgG: 484 U/mL — ABNORMAL HIGH (ref 0.0–17.9)
EBV VCA IgM: <36 U/mL (ref 0.0–35.9)

## 2018-03-27 LAB — HEMOGLOBIN A1C
Hgb A1c MFr Bld: 5.2 % (ref 4.8–5.6)
Mean Plasma Glucose: 102.54 mg/dL

## 2018-03-27 LAB — LIPID PANEL
CHOLESTEROL: 148 mg/dL (ref 0–200)
HDL: 32 mg/dL — ABNORMAL LOW (ref >40)
LDL Cholesterol: 98 mg/dL (ref 0–99)
Total CHOL/HDL Ratio: 4.6 RATIO
Triglycerides: 90 mg/dL (ref <150)
VLDL: 18 mg/dL (ref 0–40)

## 2018-03-27 LAB — RHEUMATOID FACTOR: Rheumatoid fact SerPl-aCnc: 21.8 IU/mL — ABNORMAL HIGH (ref 0.0–13.9)

## 2018-03-27 LAB — MRSA PCR SCREENING: MRSA by PCR: NEGATIVE

## 2018-03-27 LAB — CMV IGM: CMV IgM: <30 AU/mL (ref 0.0–29.9)

## 2018-03-27 LAB — ANTI-DNA ANTIBODY, DOUBLE-STRANDED: ds DNA Ab: <1 IU/mL (ref 0–9)

## 2018-03-27 LAB — ECHOCARDIOGRAM COMPLETE
Height: 63 in
Weight: 2578.5 oz

## 2018-03-27 LAB — CMV ANTIBODY, IGG (EIA): CMV Ab - IgG: 1.9 U/mL — ABNORMAL HIGH (ref 0.00–0.59)

## 2018-03-27 LAB — ANTINUCLEAR ANTIBODIES, IFA: ANA Ab, IFA: NEGATIVE

## 2018-03-27 LAB — GLUCOSE, CSF: Glucose, CSF: <20 mg/dL — CL (ref 40–70)

## 2018-03-27 LAB — PROTEIN, CSF: Total  Protein, CSF: 304 mg/dL — ABNORMAL HIGH (ref 15–45)

## 2018-03-27 MED ORDER — LORAZEPAM 2 MG/ML IJ SOLN
1.0000 mg | INTRAMUSCULAR | Status: DC | PRN
  Administered 2018-03-27: 2 mg via INTRAVENOUS
  Filled 2018-03-27: qty 1

## 2018-03-27 MED ORDER — FENTANYL CITRATE (PF) 100 MCG/2ML IJ SOLN
INTRAMUSCULAR | Status: AC
  Filled 2018-03-27: qty 2

## 2018-03-27 MED ORDER — LIDOCAINE HCL 1 % IJ SOLN
INTRAMUSCULAR | Status: AC
  Filled 2018-03-27: qty 20

## 2018-03-27 MED ORDER — PROPOFOL 1000 MG/100ML IV EMUL
0.0000 ug/kg/min | INTRAVENOUS | Status: DC
  Administered 2018-03-27: 10 ug/kg/min via INTRAVENOUS
  Administered 2018-03-28: 15 ug/kg/min via INTRAVENOUS
  Administered 2018-03-29: 25 ug/kg/min via INTRAVENOUS
  Administered 2018-03-29: 20 ug/kg/min via INTRAVENOUS
  Administered 2018-03-29 – 2018-03-30 (×2): 25 ug/kg/min via INTRAVENOUS
  Filled 2018-03-27 (×5): qty 100

## 2018-03-27 MED ORDER — MIDAZOLAM HCL 2 MG/2ML IJ SOLN
INTRAMUSCULAR | Status: AC
  Filled 2018-03-27: qty 2

## 2018-03-27 MED ORDER — ROCURONIUM BROMIDE 50 MG/5ML IV SOLN
40.0000 mg | Freq: Once | INTRAVENOUS | Status: AC
  Administered 2018-03-27: 40 mg via INTRAVENOUS
  Filled 2018-03-27: qty 4

## 2018-03-27 MED ORDER — ETOMIDATE 2 MG/ML IV SOLN
20.0000 mg | Freq: Once | INTRAVENOUS | Status: AC
  Administered 2018-03-27: 20 mg via INTRAVENOUS

## 2018-03-27 MED ORDER — MIDAZOLAM HCL 2 MG/2ML IJ SOLN: 2.0000 mg | INTRAMUSCULAR | Status: DC | PRN

## 2018-03-27 MED ORDER — FENTANYL CITRATE (PF) 100 MCG/2ML IJ SOLN
100.0000 ug | INTRAMUSCULAR | Status: DC | PRN
  Administered 2018-03-29 – 2018-03-30 (×3): 100 ug via INTRAVENOUS
  Filled 2018-03-27: qty 2

## 2018-03-27 MED ORDER — FENTANYL CITRATE (PF) 100 MCG/2ML IJ SOLN
100.0000 ug | Freq: Once | INTRAMUSCULAR | Status: AC
  Administered 2018-03-27: 100 ug via INTRAVENOUS

## 2018-03-27 MED ORDER — SODIUM CHLORIDE 0.9 % IV SOLN
1400.0000 mg | Freq: Once | INTRAVENOUS | Status: AC
  Administered 2018-03-27: 1400 mg via INTRAVENOUS
  Filled 2018-03-27: qty 28

## 2018-03-27 MED ORDER — PROPOFOL 1000 MG/100ML IV EMUL
INTRAVENOUS | Status: AC
  Filled 2018-03-27: qty 100

## 2018-03-27 MED ORDER — DEXTROSE 5 % IV SOLN
600.0000 mg | Freq: Three times a day (TID) | INTRAVENOUS | Status: DC
  Administered 2018-03-28: 600 mg via INTRAVENOUS
  Filled 2018-03-27 (×2): qty 12

## 2018-03-27 MED ORDER — FENTANYL CITRATE (PF) 100 MCG/2ML IJ SOLN
100.0000 ug | INTRAMUSCULAR | Status: DC | PRN
  Filled 2018-03-27 (×2): qty 2

## 2018-03-27 MED ORDER — MIDAZOLAM HCL 2 MG/2ML IJ SOLN
2.0000 mg | Freq: Once | INTRAMUSCULAR | Status: AC
  Administered 2018-03-27: 2 mg via INTRAVENOUS

## 2018-03-27 MED ORDER — SODIUM CHLORIDE 0.9 % IV SOLN
INTRAVENOUS | Status: AC
  Administered 2018-03-27 (×2): via INTRAVENOUS

## 2018-03-27 MED ORDER — ASPIRIN 300 MG RE SUPP
300.0000 mg | Freq: Every day | RECTAL | Status: DC
  Administered 2018-03-27 – 2018-03-28 (×2): 300 mg via RECTAL
  Filled 2018-03-27 (×2): qty 1

## 2018-03-27 MED ORDER — MIDAZOLAM HCL 2 MG/2ML IJ SOLN
2.0000 mg | INTRAMUSCULAR | Status: DC | PRN
  Administered 2018-03-29: 2 mg via INTRAVENOUS
  Filled 2018-03-27: qty 2

## 2018-03-27 MED ORDER — SODIUM CHLORIDE 0.9 % IV SOLN
1250.0000 mg | INTRAVENOUS | Status: AC
  Administered 2018-03-27: 1250 mg via INTRAVENOUS
  Filled 2018-03-27: qty 12.5

## 2018-03-27 MED ORDER — PROPOFOL 1000 MG/100ML IV EMUL: 5.0000 ug/kg/min | INTRAVENOUS | Status: DC

## 2018-03-27 MED ORDER — DEXTROSE 5 % IV SOLN
600.0000 mg | Freq: Three times a day (TID) | INTRAVENOUS | Status: AC
  Administered 2018-03-27 – 2018-03-28 (×2): 600 mg via INTRAVENOUS
  Filled 2018-03-27: qty 12
  Filled 2018-03-27: qty 10

## 2018-03-27 NOTE — Evaluation (Addendum)
Physical Therapy Evaluation Patient Details Name: Olivia Patel MRN: [id number] DOB: November 27, 1960 Today's Date: 03/27/2018   History of Present Illness  Olivia Patel is a 57 y.o. female with medical history significant for hypertension, anemia, and cancer of the right breast with osseous metastases, now presenting 11/26 with confusion, difficulty speaking, weakness of   right side weakness CT angio indicates left MCA territory acute infarct Recently DC'd from hospital for SIRS.  Clinical Impression  The patient presents with decreased right U/LE active movement, control. At times will reach  wnd attempt use of right hand. Patient has a right  Gaze preference and will attend to right with max cues. Patient does not attempt speaking except  An ocassional yes. Pt admitted with above diagnosis. Pt currently with functional limitations due to the deficits listed below (see PT Problem List). Pt will benefit from skilled PT to increase their independence and safety with mobility to allow discharge to the venue listed below.    patient has been ambulatory with a rollator PTA.      Follow Up Recommendations CIR    Equipment Recommendations  None recommended by PT    Recommendations for Other Services       Precautions / Restrictions Precautions Precautions: Fall Precaution Comments: incontinence      Mobility  Bed Mobility Overal bed mobility: Needs Assistance Bed Mobility: Rolling Rolling: +2 for safety/equipment;Max assist;Min assist         General bed mobility comments: min  and tactile cues to roll to right, max to roll to left, performed x 2 each side for bed change, requires multimodal cues to bring right leg forward. Placed bed in chair position,  attempted to lean forward with trunk, patient required max assist.   Transfers                 General transfer comment: NT  Ambulation/Gait                Stairs            Wheelchair Mobility     Modified Rankin (Stroke Patients Only)       Balance               Standing balance comment: unable to fully test.                             Pertinent Vitals/Pain Pain Assessment: No/denies pain Faces Pain Scale: Hurts a little bit Pain Location: right leg with PROM Pain Descriptors / Indicators: Discomfort;Grimacing Pain Intervention(s): Monitored during session    Home Living Family/patient expects to be discharged to:: Private residence Living Arrangements: Children Available Help at Discharge: Family;Available 24 hours/day Type of Home: Apartment Home Access: Level entry     Home Layout: One level Home Equipment: Walker - 4 wheels      Prior Function Level of Independence: Needs assistance      ADL's / Homemaking Assistance Needed: , from previous encounter  Comments: using rollator at home     Hand Dominance        Extremity/Trunk Assessment   Upper Extremity Assessment Upper Extremity Assessment: Defer to OT evaluation    Lower Extremity Assessment Lower Extremity Assessment: RLE deficits/detail;LLE deficits/detail RLE Deficits / Details: grossly 1/5 hip flexion , ankle and toe 1+/5, unable to asess sensation LLE Deficits / Details: appears WNL    Cervical / Trunk Assessment Cervical / Trunk Assessment: Other exceptions Cervical /  Trunk Exceptions: did not fully test  Communication   Communication: Expressive difficulties(did speak yes and nods when family communicating)  Cognition Arousal/Alertness: Awake/alert Behavior During Therapy: WFL for tasks assessed/performed;Flat affect Overall Cognitive Status: Impaired/Different from baseline Area of Impairment: Following commands;Attention;Awareness                       Following Commands: Follows one step commands inconsistently   Awareness: Intellectual   General Comments: patient has right gaze preference,  does attend to right at times. does not follow  directions without tactile cues. then      General Comments      Exercises     Assessment/Plan    PT Assessment Patient needs continued PT services  PT Problem List Decreased strength;Decreased cognition;Decreased activity tolerance;Decreased safety awareness;Decreased balance;Decreased knowledge of precautions;Decreased mobility;Impaired tone       PT Treatment Interventions Functional mobility training;Therapeutic activities;Therapeutic exercise;Wheelchair mobility training;Patient/family education;Cognitive remediation;Neuromuscular re-education    PT Goals (Current goals can be found in the Care Plan section)  Acute Rehab PT Goals Patient Stated Goal: agreed to mobility PT Goal Formulation: With patient/family Time For Goal Achievement: 04/10/18    Frequency Min 3X/week   Barriers to discharge        Co-evaluation               AM-PAC PT "6 Clicks" Mobility  Outcome Measure Help needed turning from your back to your side while in a flat bed without using bedrails?: Total Help needed moving from lying on your back to sitting on the side of a flat bed without using bedrails?: Total Help needed moving to and from a bed to a chair (including a wheelchair)?: Total Help needed standing up from a chair using your arms (e.g., wheelchair or bedside chair)?: Total Help needed to walk in hospital room?: Total Help needed climbing 3-5 steps with a railing? : Total 6 Click Score: 6    End of Session   Activity Tolerance: Patient tolerated treatment well Patient left: in bed;with call bell/phone within reach;with bed alarm set;with family/visitor present Nurse Communication: Mobility status PT Visit Diagnosis: Hemiplegia and hemiparesis Hemiplegia - Right/Left: Right Hemiplegia - dominant/non-dominant: Dominant Hemiplegia - caused by: Cerebral infarction    Time: 4259-5638 PT Time Calculation (min) (ACUTE ONLY): 47 min   Charges:   PT Evaluation $PT Eval Low  Complexity: 1 Low PT Treatments $Therapeutic Activity: 23-37 mins        [place] Pager [phone] Office [phone]   Claretha Cooper 03/27/2018, 10:40 AM

## 2018-03-27 NOTE — Progress Notes (Signed)
Dr. Lynetta Mare paged and made aware of patient's "seizure like" activity that has happened four times within the last 4min. Patient becomes apneic during these episodes with oxygen saturations dropping  60s-80s.   ABG obtained. Awaiting results.   Dr. Lynetta Mare request the family be brought back to the room to discuss plan of care. Currently waiting on POA and physician to arrive.

## 2018-03-27 NOTE — Progress Notes (Signed)
Stat LTM EEG initiated.  Staff educated regarding pt event button.  Dr Aroor notified.

## 2018-03-27 NOTE — Progress Notes (Addendum)
Pharmacy Antibiotic Note  Olivia Patel is a 57 y.o. female admitted on 03/05/2018 with possible HSV encephalitis.  Pharmacy has been consulted for acyclovir dosing. Successful LP, but CSF analysis and culture still pending. Of note, patient had seizure-like activity following LP, not clear if this was d/t procedure itself, or CNS pathology.  Plan:  Discussed with ID; will order 6 doses (48 hrs coverage)  F/u CSF analysis and ID recommendations for continued coverage  Height: 5\' 3"  (160 cm) Weight: 161 lb 2.5 oz (73.1 kg) IBW/kg (Calculated) : 52.4  Temp (24hrs), Avg:99.7 F (37.6 C), Min:98.8 F (37.1 C), Max:100.3 F (37.9 C)  Recent Labs  Lab 03/08/2018 1400 03/09/2018 1407 03/19/2018 1539 03/25/18 0425 03/26/18 1025 03/27/18 0500  WBC 14.3*  --   --  11.7* 12.2* 12.2*  CREATININE 0.71  --   --  0.49 0.66 0.57  LATICACIDVEN  --  1.51 1.24  --  1.6  --     Estimated Creatinine Clearance: 76.1 mL/min (by C-G formula based on SCr of 0.57 mg/dL).    No Known Allergies  Antimicrobials this admission: 11/29 Cefepime >>  11/29 Vanc >>  11/29 Flagyl >>11/29 12/1 Ampicillin >> 12/2 Acyclovir IV >>  Dose adjustments this admission:  Microbiology results: 11/29 BCx: NGTD 11/29 UCx: sent  11/30: Resp PCR: negative  Prev Cx: 11/20 BCx: ng-final 11/20 Resp PCR: neg 11/29 UCx: < 10K colonies 11/28 BCx: ng-final  Thank you for allowing pharmacy to be a part of this patient's care.  Ruffin Lada A 03/27/2018 4:28 PM

## 2018-03-27 NOTE — Progress Notes (Signed)
Dr. Rory Percy paged and made aware patient has arrive to the unit. EEG tech will be called by neurology.

## 2018-03-27 NOTE — Progress Notes (Signed)
Patient's BP 230/85. Lebatalol parameters read to give PRN when BP reaches 220/110. Paged [facility] NP, Bodenheimer and ordered to give med now.

## 2018-03-27 NOTE — Progress Notes (Signed)
CRITICAL VALUE ALERT  Critical Value:  CSF WBC 21 and Glucose <20  Date & Time Notied:  03/27/18 1725  Provider Notified: Dr.Hongalgi  Orders Received/Actions taken: Aware

## 2018-03-27 NOTE — Progress Notes (Signed)
Bilateral lower extremity venous duplex has been completed. Negative for DVT.  03/27/18 10:23 AM Carlos Levering RVT

## 2018-03-27 NOTE — Progress Notes (Signed)
   NAME:  Olivia Patel, MRN:  [id number], DOB:  09-19-1960, LOS: 3 ADMISSION DATE:  03/10/2018, CONSULTATION DATE:  03/26/18 REFERRING MD:  Algis Liming, CHIEF COMPLAINT:  SIRS   Brief History   Patient with a history of metastatic breast cancer-advanced local disease with bone metastasis Presented with a few days of malaise, fatigue, fevers and chills -Asked see for tachypnea in setting of new MCA stroke and encephalopathy    Past Medical History  Stage IV breast cancer with metastasis to the bone, Hypertension Anemia Metastatic breast cancer completed cycle 6 of Taxol/carboplatin on 02/24/2018    [facility] Events   11/29 admitted for fever and weakness  12/1 acute encephalopathy, new stroke involving MCA on left.  Pulmonary asked to see due to tachypnea  Consults:  Infectious disease Neurology  Procedures:   Significant Diagnostic Tests:  MRI left MCA  stroke  Micro Data:  Blood culture 12 /1-we will follow Blood culture 11/29-negative Urine culture 11/29-negative Urinalysis 11/29-unremarkable Respiratory viral panel negative  All cultures negative to date Antimicrobials:  Ampicillin 12 /1>> Cefepime11/29>> Metronidazole 11/29 Vancomycin 11/29  Interim history/subjective:  Interactive, exhibiting expressive and receptive aphasia  Objective   Blood pressure (Abnormal) 215/92, pulse 98, temperature 100.3 F (37.9 C), temperature source Oral, resp. rate (Abnormal) 38, height _0  (1.6 m), weight 73.1 kg, last menstrual period 05/29/2015, SpO2 97 %.        Intake/Output Summary (Last 24 hours) at 03/27/2018 1105 Last data filed at 03/27/2018 1000 Gross per 24 hour  Intake 2689.59 ml  Output 600 ml  Net 2089.59 ml   Filed Weights   03/12/2018 1323 03/25/18 0501 03/27/18 0500  Weight: 74.4 kg 74.8 kg 73.1 kg    Examination: General 57 year old female currently resting in bed HEENT normocephalic atraumatic facial droop is appreciated Neuro:  Right-sided weakness and neglect speech is dysarthric Pulmonary: Tachypneic, shallow breathing.  Occasional rhonchus with weak cough mechanics, episodic apnea Cardiac: Regular rate and rhythm Abdomen soft nontender no organomegaly GU: Clear yellow  Resolved Hospital Problem list     Assessment & Plan:  Acute metabolic encephalopathy further c/b left MCA stroke.  -we have still yet to r/o underlying CNS infection vs malignancy  Plan Cont supportive care For LP later today Further recs per ID and neuro  Fever/SIRS.   -etiology unclear. ? CNS infection vs oncological in origin  Plan Cont empiric abx recs per ID  On-going tachypnea w/ respiratory alk -do not think that this is pulmonary in origin favor central either driven by stroke or encephalopathy  Plan Repeat lactic acid Supportive care LE dopplers   Metastatic breast cancer -Advanced disease with bone metastases -Recently completed course of chemotherapy Plan F/u per Onc  Bilateral pleural effusions -Pleural effusions are small Plan  Nothing to do  Contraction alkalosis Plan Cont current IVFs.   Best practice:  Diet: N.p.o. DVT prophylaxis: Lovenox Mobility: Bedrest Code Status: Full code-previous DNR status discussed with family Family Communication: Family at bedside Disposition: SDU PCCM will sign off. Do not think her tachypnea is due to a primary pulmonary issue.   Erick Colace ACNP-BC [place] Pager # [phone] OR # [phone] if no answer

## 2018-03-27 NOTE — Progress Notes (Addendum)
PROGRESS NOTE   Olivia Patel  AUQ:[id number]    DOB: August 19, 1960    DOA: 03/03/2018  PCP: Lance Sell, NP   I have briefly reviewed patients previous medical records in [facility].  Brief Narrative:  57 year old female with PMH of metastatic breast cancer with bone mets, completed cycle 6 weekly Taxol/carboplatin on 03/03/2018, no evidence of disease progression as per last oncology office visit 03/03/2018, plans to refer to general surgery to consider partial or full right breast mastectomy for remaining mass, hypercalcemia, HTN, anemia secondary to metastatic breast cancer and chemotherapy, hospitalized for culture-negative fever 08/2017, recent hospitalization 03/13/2018-03/18/2018 when she presented with fever, sinus congestion and right eye pain, was managed for SIRS, extensive work-up including chest x-ray, flu panel & RSV panel PCR, blood cultures were negative, she was treated initially with IV vancomycin and cefepime, transitioned to oral Augmentin at discharge, presented again to [facility] long ED on 04/23/2018 with complaints of chills and fevers (up to 101.5 F), generalized malaise, fatigue, body aches consistent with SIRS without clear source of infection.  Reports sick contacts (adult daughter with URI symptoms and 44-year-old grandchild with recent diarrhea). Admitted for SIRS. ID consulted, CT C/A/P with contrast without clear source for sepsis.  Patient declined overnight 11/30, worsening mental status changes, tachypnea.  Transferred to ICU 12/1 for close monitoring and management.  Neurology, CCM and oncology consulted.  MRI brain, CTA head and neck suggestive of left MCA territory acute infarct with associated aphasia.  Ruling out infectious meningoencephalitis.  LP by IR pending.   Assessment & Plan:   Active Problems:   Essential hypertension   Anemia   Hypercalcemia   Breast cancer ([place])   Port-A-Cath in place   SIRS (systemic inflammatory response syndrome)  (HCC)   Sepsis ([place])  Acute encephalopathy: Worsening mental status changes since 11/30 evening. Multiple DD's: FUO, meningoencephalitis- infectious versus carcinomatous, brain mets, seizure disorder, hypertensive encephalopathy, medications versus other etiology.  Transferred to ICU on 12/1 for close monitoring.  Patient CT head without contrast: No acute findings.   Minimized sedative medications/opioids.  Delirium precautions.  Neurology consulted for consultation and LP (culture and cytology). NPO until safe to feed.  Broadened antibiotic coverage for meningitis.  Ampicillin added to vancomycin and cefepime. Ammonia and lactate normal.  MRI brain, CTA head and neck suggestive of left MCA stroke, evaluation per neurology as below.  MRI brain did not show brain mets.  EEG: Encephalopathic picture, no seizure activity.  LP by IR pending for today.  Patient appears more alert today but aphasic and unable to follow commands.  Suspected left MCA stroke: Appears a phasic.  Also right upper extremity weakness.  Neurology input appreciated.  Complete stroke work-up per neurology.  LDL 98 and A1c 5.2.  PT recommends CIR when stable.  Rectal aspirin 300 mg daily until ST eval for swallowing.  Tachypnea/acute respiratory failure with hypoxia:?  Central/neurogenic.  ABG: pH 7.521, PCO2 36, PO2 67, bicarbonate 29.2.  Breathing seems slightly better.  Repeat chest x-ray 12/2 appreciated: Mild improvement in aeration at left base.  Small pleural effusions.  Repeat ABG 12/1: pH 7.496, PCO2 41, PO2 93, oxygen saturation 97.3.  As per CCM follow-up and sign off 12/2, suspect tachypnea to be neurogenic rather than pulmonary in origin.  Fever of unknown origin/SIRS, recurrent: She presented with fever/100.6, leukocytosis, tachycardia and tachypnea.  Unclear etiology.  Flu panel PCR x2 since 03/05/2018: Negative, RSV panel x2 since 03/15/2018: Negative, HIV screen 09/17/2017: Negative urine microscopy  11/29: Not indicative of  UTI, blood cultures x2 from 11/20: Negative.  Blood cultures x2 from 11/29 and from 12/1: Negative to date.  Urine culture 11/29: Negative.  Chest x-ray 02/26/2018 without acute findings.  Empirically started on IV cefepime and vancomycin. CT chest, abdomen and pelvis with IV and p.o. contrast without obvious source for fever. If negative then may have to consider removal of Port-A-Cath.  Ampicillin added 12/1.  HIV screen nonreactive.  I discussed with IR on 12/1 with plans for LP today.  LEV Dopplers negative.  Hypercalcemia: Suspect due to dehydration.  Was 10.6 on admission, and down to 9.4 today.  This degree of hypercalcemia less likely to cause mental status changes by itself but could contribute along with other etiology.?  Hypertensive urgency: Patient unable to take p.o. safely.  Started IV Cardene drip.  Due to suspected stroke and to allow for permissive hypertension, discontinued Cardene drip and treat BP if >220/110 for 48 hours post stroke.  Anemia of chronic disease: Related to metastatic breast cancer to bone and cancer chemotherapy.  Hemoglobin is dropped from 10.2 on admission to 8.6, some of it is possibly hemodilution.  Hemoglobin fluctuating but stable.  Transfuse if hemoglobin 7 g or less.    Thrombocytopenia: Unclear etiology.  Fluctuating platelet count.  Dehydration with hyponatremia: Sodium dropped from 133-127.  Received a dose of IV Lasix overnight 11/30.  Continue gentle IV saline hydration.  Sodium slightly better/stable.  Hypokalemia: Replaced  Metastatic breast cancer with bone metastasis: Oncology expected to follow-up today.  Also need input regarding removal of Port-A-Cath as per ID recommendations.  DVT prophylaxis: Lovenox Code Status: Full Family Communication: Discussed in detail with patient's daughter and patient's brother at bedside.  Updated care and answer questions.  Advised them that she is slightly better compared to yesterday. Disposition: Patient  not medically ready for discharge.  CIR when medically improved.   Consultants:  ID Medical oncology Neurology CCM IR for LP.  Procedures:  None  Antimicrobials:  IV cefepime and vancomycin 11/29 > IV ampicillin 12/1 >   Subjective: Patient much more alert today.  However nonverbal and does not follow instructions.  As per RN, no acute issues noted.  Noted right upper extremity weakness compared to left.  ROS: As above, otherwise negative.  Objective:  Vitals:   03/27/18 0700 03/27/18 0800 03/27/18 0900 03/27/18 1000  BP: (!) 206/77 (!) 196/95 (!) 204/76 (!) 215/92  Pulse: 94 89 95 98  Resp: (!) 30 (!) 31 (!) 38 (!) 38  Temp:  100.3 F (37.9 C)    TempSrc:  Oral    SpO2: 99% 98% 99% 97%  Weight:      Height:        Examination:  General exam: Pleasant middle-aged female, moderately built and nourished lying propped up in bed, looks improved compared to yesterday, more alert, smiling and less tachypneic. Respiratory system: Mildly tachypneic.  Slightly diminished breath sounds in the bases without wheezing, rhonchi or crackles. Cardiovascular system: S1 & S2 heard, RRR. No JVD, murmurs, rubs, gallops or clicks. No pedal edema.  Telemetry personally reviewed: SR- ST in the 100s. Gastrointestinal system: Abdomen is nondistended, soft and nontender. No organomegaly or masses felt. Normal bowel sounds heard.  Stable. Central nervous system: Mental status as noted above.  Appears a phasic.  No facial asymmetry. Extremities: No asymmetric lower extremity swelling.  Moves all extremities well except grade 2 x 5 power in right upper extremity Skin: No rashes, lesions  or ulcers Psychiatry: Judgement and insight impaired. Mood & affect pleasant and smiling..     Data Reviewed: I have personally reviewed following labs and imaging studies  CBC: Recent Labs  Lab 03/16/2018 1400 03/25/18 0425 03/26/18 1025 03/27/18 0500  WBC 14.3* 11.7* 12.2* 12.2*  NEUTROABS 11.7*  --   10.0*  --   HGB 10.2* 8.6* 10.0* 8.6*  HCT 31.1* 27.5* 31.2* 27.2*  MCV 92.3 92.9 90.4 89.8  PLT 156 144* 161 353*   Basic Metabolic Panel: Recent Labs  Lab 03/25/2018 1400 03/25/18 0425 03/26/18 1025 03/27/18 0500  NA 134* 133* 127* 128*  K 3.2* 4.0 3.3* 3.6  CL 89* 94* 81* 85*  CO2 27 29 31 30   GLUCOSE 108* 126* 112* 103*  BUN 15 11 11 16   CREATININE 0.71 0.49 0.66 0.57  CALCIUM 10.6* 9.9 10.5* 9.4  MG 1.7  --   --   --    Liver Function Tests: Recent Labs  Lab 03/14/2018 1400 03/25/18 0425 03/26/18 1025  AST 33 26 25  ALT 36 26 24  ALKPHOS 195* 168* 176*  BILITOT 1.2 1.1 1.0  PROT 8.1 6.9 8.1  ALBUMIN 3.2* 2.7* 3.1*   Coagulation Profile: Recent Labs  Lab 02/25/2018 1400  INR 1.31     Recent Results (from the past 240 hour(s))  Culture, blood (Routine x 2)     Status: None (Preliminary result)   Collection Time: 03/13/2018  2:01 PM  Result Value Ref Range Status   Specimen Description   Final    BLOOD LEFT ANTECUBITAL Performed at [facility] [address] [place], [place] [address]    Special Requests   Final    BOTTLES DRAWN AEROBIC AND ANAEROBIC Blood Culture adequate volume Performed at [facility] [address], [place], [place] [address]    Culture   Final    NO GROWTH 3 DAYS Performed at [facility], [place] [address] [place], [place] [address]    Report Status PENDING  Incomplete  Culture, blood (Routine x 2)     Status: None (Preliminary result)   Collection Time: 03/21/2018  2:52 PM  Result Value Ref Range Status   Specimen Description   Final    BLOOD LEFT CHEST Performed at [facility] [address], [place], [place] [address]    Special Requests   Final    BOTTLES DRAWN AEROBIC AND ANAEROBIC Blood Culture adequate volume Performed at [facility] [address], [place], [place] [address]    Culture   Final    NO GROWTH 3 DAYS Performed at [facility], [place] [address], [place], [place] [address]    Report Status PENDING  Incomplete  Urine culture     Status: None   Collection Time: 03/08/2018  7:16 PM  Result Value Ref Range Status   Specimen Description   Final    URINE, CLEAN CATCH Performed at [facility], [place] [address] [place], [place] [address]    Special Requests   Final    NONE Performed at [facility], [place] [address], [place], [place] [address]    Culture   Final    NO GROWTH Performed at [facility], [place] [address] [place],  [address]    Report Status 03/26/2018 FINAL  Final  Respiratory Panel by PCR     Status: None   Collection Time: 03/25/18 12:34 AM  Result Value Ref Range Status   Adenovirus NOT DETECTED NOT DETECTED Final   Coronavirus 229E NOT DETECTED NOT DETECTED Final   Coronavirus HKU1 NOT DETECTED NOT DETECTED Final   Coronavirus NL63 NOT DETECTED NOT DETECTED Final   Coronavirus OC43 NOT DETECTED NOT DETECTED Final   Metapneumovirus NOT DETECTED NOT DETECTED Final   Rhinovirus / Enterovirus NOT DETECTED NOT DETECTED Final   Influenza A NOT DETECTED NOT DETECTED Final   Influenza B NOT DETECTED NOT DETECTED Final   Parainfluenza Virus 1 NOT DETECTED NOT DETECTED Final   Parainfluenza Virus 2 NOT DETECTED NOT DETECTED Final   Parainfluenza Virus 3 NOT DETECTED NOT DETECTED Final   Parainfluenza Virus 4 NOT DETECTED NOT DETECTED Final   Respiratory Syncytial Virus NOT DETECTED NOT DETECTED Final   Bordetella pertussis NOT DETECTED NOT DETECTED Final   Chlamydophila pneumoniae NOT DETECTED NOT DETECTED Final   Mycoplasma pneumoniae NOT DETECTED NOT DETECTED Final    Comment: Performed at [facility], [place] [address] [place], [place] [address]  Culture, blood (Routine X 2) w Reflex to ID Panel     Status: None (Preliminary result)   Collection Time: 03/26/18 10:25 AM  Result Value Ref Range Status   Specimen Description   Final     BLOOD LEFT HAND Performed at [facility] [address] [place], [place] [address]    Special Requests   Final    BOTTLES DRAWN AEROBIC ONLY Blood Culture adequate volume Performed at [facility] [address] [place], [place] [address]    Culture   Final    NO GROWTH < 24 HOURS Performed at [facility] [address], [place], [place] [address]    Report Status PENDING  Incomplete  Culture, blood (Routine X 2) w Reflex to ID Panel     Status: None (Preliminary result)   Collection Time: 03/26/18 10:26 AM  Result Value Ref Range Status   Specimen Description   Final    BLOOD RIGHT ARM Performed at [facility], [place] [address] [place], [place] [address]    Special Requests   Final    BOTTLES DRAWN AEROBIC ONLY Blood Culture adequate volume Performed at [facility] [address] [place], [place] [address]    Culture   Final    NO GROWTH < 24 HOURS Performed at [facility] [address] [place],  [address]    Report Status PENDING  Incomplete         Radiology Studies: Ct Angio Head W Or Wo Contrast  Result Date: 03/26/2018 CLINICAL DATA:  Aphasia. EXAM: CT ANGIOGRAPHY HEAD AND NECK TECHNIQUE: Multidetector CT imaging of the head and neck was performed using the standard protocol during bolus administration of intravenous contrast. Multiplanar CT image reconstructions and MIPs were obtained to evaluate the vascular anatomy. Carotid stenosis measurements (when applicable) are obtained utilizing NASCET criteria, using the distal internal carotid diameter as the denominator. CONTRAST:  176mL ISOVUE-370 IOPAMIDOL (ISOVUE-370) INJECTION 76% COMPARISON:  Neck CT 09/19/2017. FINDINGS: The study is mildly to moderately motion degraded. CTA NECK FINDINGS Aortic arch: Normal variant aortic arch branching pattern with common origin of the brachiocephalic and left common carotid arteries. Widely  patent arch vessel origins. Right carotid system: Patent without evidence of stenosis or dissection. Left carotid system: Patent without evidence of stenosis or dissection. Vertebral arteries: Patent without stenosis or dissection identified with V2 assessment limited by motion  artifact. Slightly dominant right vertebral artery. Skeleton: Known osseous metastatic disease including a 2 cm right frontal skull lesion. Extensive metastatic disease in the spine including a C5 lesion with chronic pathologic fracture. Other neck: No mass or enlarged lymph nodes identified. Upper chest: Bilateral pleural effusions. Review of the MIP images confirms the above findings CTA HEAD FINDINGS Anterior circulation: Internal carotid arteries are patent from skull base to carotid termini with minimal nonstenotic plaque. A 2 mm outpouching is noted from the left supraclinoid ICA in the posterior communicating region without a vessel clearly seen arising from it. The M1 segments and MCA bifurcations are widely patent bilaterally. There is MCA branch vessel irregularity and attenuation bilaterally, more notable on the left where there are multiple missing distal M2/M3 and more distal branch vessels, particularly in the inferior division supplying the temporoparietal brain. There is no proximal M2 occlusion. ACAs are patent with moderate branch vessel irregularity but no significant proximal stenosis. Posterior circulation: The intracranial vertebral arteries are patent to the basilar. Patent PICA and SCA origins are identified bilaterally. The basilar artery is widely patent. Posterior communicating arteries are diminutive or absent. PCAs are patent with branch vessel irregularity and attenuation but no significant proximal stenosis. No aneurysm is identified. Venous sinuses: As permitted by contrast timing, patent. Anatomic variants: None. Delayed phase: Possible early loss of cortical gray-white differentiation in the posterior left  temporal lobe with assessment limited by motion artifact. No gross enhancing mass lesion. Review of the MIP images confirms the above findings IMPRESSION: 1. Motion degraded examination without large vessel occlusion. 2. Multiple missing distal left MCA branch vessels with possible early changes of acute posterior left temporal lobe infarct. 3. Widely patent carotid arteries. 4. 2 mm left supraclinoid ICA aneurysm versus infundibulum. 5. Widespread osseous metastatic disease. The study was reviewed in person with Dr. Leonel Ramsay on 03/26/2018 at 3:05 p.m. Electronically Signed   By: Trang Bores M.D.   On: 03/26/2018 15:23   Ct Head Wo Contrast  Result Date: 03/26/2018 CLINICAL DATA:  Metastatic breast cancer, fever, rapid response, altered mental status EXAM: CT HEAD WITHOUT CONTRAST TECHNIQUE: Contiguous axial images were obtained from the base of the skull through the vertex without intravenous contrast. COMPARISON:  09/17/2017 FINDINGS: Brain: Diffuse patchy white matter hypoattenuation bilaterally throughout both cerebral hemispheres compatible with chronic white matter microvascular changes, slight progression compared to the prior study. No acute intracranial hemorrhage, midline shift, large mass lesion, definite new infarction, or extra-axial fluid collection. No focal mass effect or edema. Cisterns are patent. No gross cerebellar abnormality. Exam is limited with some motion artifact. Vascular: No hyperdense vessel or unexpected calcification. Skull: Previously described tiny lucent areas throughout the skull are less apparent. Patient does have a history of osseous metastatic disease. No displaced or depressed fracture. Sinuses/Orbits: Orbits are symmetric. Minor right maxillary sinus mucosal thickening. Otherwise sinuses and mastoids are clear. Other: None. IMPRESSION: Limited with motion artifact and without IV contrast. This limits evaluation for intracranial metastases. Slight progression of white  matter microvascular changes. No acute intracranial hemorrhage or mass effect. Electronically Signed   By: Jerilynn Mages.  Shick M.D.   On: 03/26/2018 08:51   Ct Angio Neck W Or Wo Contrast  Result Date: 03/26/2018 CLINICAL DATA:  Aphasia. EXAM: CT ANGIOGRAPHY HEAD AND NECK TECHNIQUE: Multidetector CT imaging of the head and neck was performed using the standard protocol during bolus administration of intravenous contrast. Multiplanar CT image reconstructions and MIPs were obtained to evaluate the vascular anatomy. Carotid stenosis  measurements (when applicable) are obtained utilizing NASCET criteria, using the distal internal carotid diameter as the denominator. CONTRAST:  158mL ISOVUE-370 IOPAMIDOL (ISOVUE-370) INJECTION 76% COMPARISON:  Neck CT 09/19/2017. FINDINGS: The study is mildly to moderately motion degraded. CTA NECK FINDINGS Aortic arch: Normal variant aortic arch branching pattern with common origin of the brachiocephalic and left common carotid arteries. Widely patent arch vessel origins. Right carotid system: Patent without evidence of stenosis or dissection. Left carotid system: Patent without evidence of stenosis or dissection. Vertebral arteries: Patent without stenosis or dissection identified with V2 assessment limited by motion artifact. Slightly dominant right vertebral artery. Skeleton: Known osseous metastatic disease including a 2 cm right frontal skull lesion. Extensive metastatic disease in the spine including a C5 lesion with chronic pathologic fracture. Other neck: No mass or enlarged lymph nodes identified. Upper chest: Bilateral pleural effusions. Review of the MIP images confirms the above findings CTA HEAD FINDINGS Anterior circulation: Internal carotid arteries are patent from skull base to carotid termini with minimal nonstenotic plaque. A 2 mm outpouching is noted from the left supraclinoid ICA in the posterior communicating region without a vessel clearly seen arising from it. The M1  segments and MCA bifurcations are widely patent bilaterally. There is MCA branch vessel irregularity and attenuation bilaterally, more notable on the left where there are multiple missing distal M2/M3 and more distal branch vessels, particularly in the inferior division supplying the temporoparietal brain. There is no proximal M2 occlusion. ACAs are patent with moderate branch vessel irregularity but no significant proximal stenosis. Posterior circulation: The intracranial vertebral arteries are patent to the basilar. Patent PICA and SCA origins are identified bilaterally. The basilar artery is widely patent. Posterior communicating arteries are diminutive or absent. PCAs are patent with branch vessel irregularity and attenuation but no significant proximal stenosis. No aneurysm is identified. Venous sinuses: As permitted by contrast timing, patent. Anatomic variants: None. Delayed phase: Possible early loss of cortical gray-white differentiation in the posterior left temporal lobe with assessment limited by motion artifact. No gross enhancing mass lesion. Review of the MIP images confirms the above findings IMPRESSION: 1. Motion degraded examination without large vessel occlusion. 2. Multiple missing distal left MCA branch vessels with possible early changes of acute posterior left temporal lobe infarct. 3. Widely patent carotid arteries. 4. 2 mm left supraclinoid ICA aneurysm versus infundibulum. 5. Widespread osseous metastatic disease. The study was reviewed in person with Dr. Leonel Ramsay on 03/26/2018 at 3:05 p.m. Electronically Signed   By: Cara Bores M.D.   On: 03/26/2018 15:23   Ct Chest W Contrast  Result Date: 03/25/2018 CLINICAL DATA:  Fever of unknown origin.  Stage 4 breast cancer. EXAM: CT CHEST, ABDOMEN, AND PELVIS WITH CONTRAST TECHNIQUE: Multidetector CT imaging of the chest, abdomen and pelvis was performed following the standard protocol during bolus administration of intravenous contrast.  CONTRAST:  154mL OMNIPAQUE IOHEXOL 300 MG/ML SOLN, 63mL OMNIPAQUE IOHEXOL 300 MG/ML SOLN COMPARISON:  CT chest 12/22/2017 and CT AP 09/19/2017. FINDINGS: CT CHEST FINDINGS Cardiovascular: The heart size appears normal. No pericardial effusion. Mediastinum/Nodes: Right axillary lymph node measures 1.2 cm, image 21/3. Previously 1.1 cm. No supraclavicular adenopathy. No enlarged left axillary lymph nodes. Normal appearance of the thyroid gland. The trachea appears patent and is midline. Small hiatal hernia noted. No mediastinal or hilar adenopathy. Lungs/Pleura: Bilateral pleural effusions identified, increased from previous exam. There is no airspace consolidation identified. Left upper lobe lung nodule measures 6 mm and is unchanged, image 61/5. No additional pulmonary  nodules noted. Musculoskeletal: There is diffuse skin thickening overlying the right breast. Right breast mass is again identified containing clip measuring 2.1 cm, image 28/3. Previously 2.6 cm. Mixed lytic and sclerotic bone metastases are identified throughout the visualized axial and appendicular skeleton. This appears progressive when compared with the previous exam. For example, within the proximal body of sternum there is near complete loss of the anterior cortex, image 82/7. New from previous study. Lucent lesion within the T10 vertebral body measures 1.1 cm, image 84/7. Previously 0.5 cm. Lucent lesion involving the T9 vertebra with pathologic fracture measures 1.2 cm, image 90/7. Previously 0.4 cm. T3 lucent lesion measures 1.7 cm, image 84/7. New from previous exam. CT ABDOMEN PELVIS FINDINGS Hepatobiliary: There are at least 5 ill-defined lucent lesions within the liver which are suspicious for metastatic disease. The largest is in segment 7 measuring 2.2 cm. Not previously characterized. Gallbladder normal. No biliary dilatation. Pancreas: Unremarkable. No pancreatic ductal dilatation or surrounding inflammatory changes. Spleen: Normal in  size without focal abnormality. Adrenals/Urinary Tract: Adrenal glands are unremarkable. Kidneys are normal, without renal calculi, focal lesion, or hydronephrosis. Bladder is unremarkable. Stomach/Bowel: Small hiatal hernia. The small bowel loops have a normal course and caliber. No obstruction. No pathologic dilatation of the colon. Similar appearance of the appendix which contains gas within its lumen measuring 8 mm in diameter. Mild periappendiceal haziness noted, image 71/6. No pathologic dilatation of the colon. Vascular/Lymphatic: Normal appearance of the abdominal aorta. No enlarged abdominopelvic lymph nodes. Reproductive: Partially calcified and enlarged fibroid uterus noted. Other: Small amount of free fluid noted within the pelvis. Musculoskeletal: Extensive lytic bone metastases are identified. This has increased when compared with the previous exam. New pathologic fracture involves the L4 vertebra. IMPRESSION: 1. No highly specific findings identified to indicate patient's fever of unknown origin. The appendix is visualized and mildly increased in caliber measuring 8 mm. There is mild periappendiceal haziness is well. No significant periappendiceal free fluid identified. Correlation for any clinical signs or symptoms of appendicitis advised. 2. There has been interval progression of diffuse lytic bone metastases with new pathologic fractures involving T9 and L4 vertebra. 3. Multiple low-attenuation lesions within the liver, worrisome for metastatic disease. Not previously characterized. Electronically Signed   By: Kerby Moors M.D.   On: 03/25/2018 18:17   Mr Brain Wo Contrast  Addendum Date: 03/26/2018   ADDENDUM REPORT: 03/26/2018 15:37 ADDENDUM: Taking into account the findings on the subsequent CTA, some of the asymmetric diffusion signal in the left MCA territory originally attributed to artifact may in fact reflect early changes of acute infarct, specifically in the lateral left temporal  lobe, left insula, and possibly operculum. This MRI was reviewed again with Dr. Leonel Ramsay in person at the time of CTA interpretation. Electronically Signed   By: Springston Bores M.D.   On: 03/26/2018 15:37   Result Date: 03/26/2018 CLINICAL DATA:  Aphasia.  History of breast cancer. EXAM: MRI HEAD WITHOUT CONTRAST TECHNIQUE: Multiplanar, multiecho pulse sequences of the brain and surrounding structures were obtained without intravenous contrast. COMPARISON:  Head CT 03/26/2018 and MRI 09/19/2017 FINDINGS: The examination was aborted due to patient agitation. Only a moderately motion degraded axial diffusion sequence was obtained. There is a 6 mm focus of restricted diffusion in the left paramedian pons consistent with acute infarct. Supratentorial signal is mildly asymmetric between the left and right cerebral hemispheres which is felt to be due to artifact without a convincing sizable acute infarct identified. A chronic right frontal skull  lesion is incompletely evaluated. IMPRESSION: Motion degraded diffusion only examination demonstrating a 6 mm acute infarct (less likely metastasis) in the left pons. No convincing acute supratentorial infarct. The study was reviewed in person with Dr. Leonel Ramsay on 03/26/2018 at 21:58 p.m. Electronically Signed: By: Lenig Bores M.D. On: 03/26/2018 14:18   Ct Abdomen Pelvis W Contrast  Result Date: 03/25/2018 CLINICAL DATA:  Fever of unknown origin.  Stage 4 breast cancer. EXAM: CT CHEST, ABDOMEN, AND PELVIS WITH CONTRAST TECHNIQUE: Multidetector CT imaging of the chest, abdomen and pelvis was performed following the standard protocol during bolus administration of intravenous contrast. CONTRAST:  18mL OMNIPAQUE IOHEXOL 300 MG/ML SOLN, 39mL OMNIPAQUE IOHEXOL 300 MG/ML SOLN COMPARISON:  CT chest 12/22/2017 and CT AP 09/19/2017. FINDINGS: CT CHEST FINDINGS Cardiovascular: The heart size appears normal. No pericardial effusion. Mediastinum/Nodes: Right axillary lymph node  measures 1.2 cm, image 21/3. Previously 1.1 cm. No supraclavicular adenopathy. No enlarged left axillary lymph nodes. Normal appearance of the thyroid gland. The trachea appears patent and is midline. Small hiatal hernia noted. No mediastinal or hilar adenopathy. Lungs/Pleura: Bilateral pleural effusions identified, increased from previous exam. There is no airspace consolidation identified. Left upper lobe lung nodule measures 6 mm and is unchanged, image 61/5. No additional pulmonary nodules noted. Musculoskeletal: There is diffuse skin thickening overlying the right breast. Right breast mass is again identified containing clip measuring 2.1 cm, image 28/3. Previously 2.6 cm. Mixed lytic and sclerotic bone metastases are identified throughout the visualized axial and appendicular skeleton. This appears progressive when compared with the previous exam. For example, within the proximal body of sternum there is near complete loss of the anterior cortex, image 82/7. New from previous study. Lucent lesion within the T10 vertebral body measures 1.1 cm, image 84/7. Previously 0.5 cm. Lucent lesion involving the T9 vertebra with pathologic fracture measures 1.2 cm, image 90/7. Previously 0.4 cm. T3 lucent lesion measures 1.7 cm, image 84/7. New from previous exam. CT ABDOMEN PELVIS FINDINGS Hepatobiliary: There are at least 5 ill-defined lucent lesions within the liver which are suspicious for metastatic disease. The largest is in segment 7 measuring 2.2 cm. Not previously characterized. Gallbladder normal. No biliary dilatation. Pancreas: Unremarkable. No pancreatic ductal dilatation or surrounding inflammatory changes. Spleen: Normal in size without focal abnormality. Adrenals/Urinary Tract: Adrenal glands are unremarkable. Kidneys are normal, without renal calculi, focal lesion, or hydronephrosis. Bladder is unremarkable. Stomach/Bowel: Small hiatal hernia. The small bowel loops have a normal course and caliber. No  obstruction. No pathologic dilatation of the colon. Similar appearance of the appendix which contains gas within its lumen measuring 8 mm in diameter. Mild periappendiceal haziness noted, image 71/6. No pathologic dilatation of the colon. Vascular/Lymphatic: Normal appearance of the abdominal aorta. No enlarged abdominopelvic lymph nodes. Reproductive: Partially calcified and enlarged fibroid uterus noted. Other: Small amount of free fluid noted within the pelvis. Musculoskeletal: Extensive lytic bone metastases are identified. This has increased when compared with the previous exam. New pathologic fracture involves the L4 vertebra. IMPRESSION: 1. No highly specific findings identified to indicate patient's fever of unknown origin. The appendix is visualized and mildly increased in caliber measuring 8 mm. There is mild periappendiceal haziness is well. No significant periappendiceal free fluid identified. Correlation for any clinical signs or symptoms of appendicitis advised. 2. There has been interval progression of diffuse lytic bone metastases with new pathologic fractures involving T9 and L4 vertebra. 3. Multiple low-attenuation lesions within the liver, worrisome for metastatic disease. Not previously characterized. Electronically Signed  By: Kerby Moors M.D.   On: 03/25/2018 18:17   Dg Chest Port 1 View  Result Date: 03/27/2018 CLINICAL DATA:  Fever and shortness of breath EXAM: PORTABLE CHEST 1 VIEW COMPARISON:  Yesterday FINDINGS: Porta catheter on the left with tip at the upper cavoatrial junction. Low volumes with interstitial coarsening and hazy density at the left base. There is improved aeration at the left base with better visualized diaphragm. Healing right-sided rib fractures. Known osseous metastatic disease. IMPRESSION: 1. Mild improvement in aeration at the left base. 2. Small pleural effusions known by recent chest CT. There is mildly improved aeration at the left base. Electronically  Signed   By: Monte Fantasia M.D.   On: 03/27/2018 09:56   Dg Chest Port 1 View  Result Date: 03/26/2018 CLINICAL DATA:  Increased rate of breathing. EXAM: PORTABLE CHEST 1 VIEW COMPARISON:  CT yesterday. Radiograph 03/03/2018 FINDINGS: Left chest port tip in the SVC. Bilateral pleural effusions and adjacent atelectasis, more apparent on the left and progressed from prior radiograph. Normal heart size and mediastinal contours. Mild vascular congestion without overt pulmonary edema. No pneumothorax. Again seen bilateral rib fractures, some with have callus formation. IMPRESSION: Vascular congestion. Bilateral pleural effusions and adjacent atelectasis, more apparent on the left, likely increased from radiograph 2 days ago. Electronically Signed   By: Keith Rake M.D.   On: 03/26/2018 06:19        Scheduled Meds: . aspirin EC  325 mg Oral Daily  . enoxaparin (LOVENOX) injection  40 mg Subcutaneous Q24H  . fluticasone  2 spray Each Nare Daily   Continuous Infusions: . sodium chloride 10 mL/hr at 03/27/18 0349  . ampicillin (OMNIPEN) IV Stopped (03/27/18 0836)  . ceFEPime (MAXIPIME) IV Stopped (03/27/18 0555)  . vancomycin Stopped (03/26/18 1752)     LOS: 3 days     Vernell Leep, MD, FACP, [facility]. [facility] Pager [phone]  If 7PM-7AM, please contact night-coverage [email or web address] Password [email or web address] 03/27/2018, 11:08 AM

## 2018-03-27 NOTE — Progress Notes (Signed)
Interval progress note   Stat EEG showed multiple electrographic seizures. Loadd with fosphenytoin and patient intubated for airway protection. Will start Propofol at rate of  32mcg/kg/min. Will continue to monitor EEG and titrate as needed.

## 2018-03-27 NOTE — Progress Notes (Signed)
[facility] transported patient to [facility] [id number]. Report given to Julieanne Cotton, RN on unit 4N. Patient's family to meet patient over there.

## 2018-03-27 NOTE — Procedures (Signed)
Uncomplicated LP at M7/5.  12cc was collected.  Due to pt condition, decubitus OP measurement was not feasible.    JWatts MD

## 2018-03-27 NOTE — Procedures (Signed)
Intubation Procedure Note Olivia Patel [id number] 1960-12-28  Procedure: Intubation Indications: Airway protection and maintenance  Procedure Details Consent: Risks of procedure as well as the alternatives and risks of each were explained to the (patient/caregiver).  Consent for procedure obtained. Time Out: Verified patient identification, verified procedure, site/side was marked, verified correct patient position, special equipment/implants available, medications/allergies/relevent history reviewed, required imaging and test results available.  Performed  Maximum clean technique was used including gloves, hand hygiene and mask.  MAC and 4 Glidescope  Medications: - Fenatnyl 112mg - Versed 255m- Etomidate 2054m Rocuronium 93m38mrade IV airway view. Very anterior airway. More anterior than glidescope stylet curvature.    Evaluation Hemodynamic Status: BP stable throughout; O2 sats: stable throughout Patient's Current Condition: stable Complications: No apparent complications Patient did tolerate procedure well. Chest X-ray ordered to verify placement.  CXR: pending.  PaulGeorgann HousekeeperACNP-BC [facility] [phone] 4218/05/2017 10:21 PM

## 2018-03-27 NOTE — Progress Notes (Signed)
  Echocardiogram 2D Echocardiogram has been performed.  Olivia Patel M 03/27/2018, 11:49 AM

## 2018-03-27 NOTE — Progress Notes (Signed)
Oncology short note  Patient chart reviewed FUO in patient with metastatic breast cancer. Case discussed with Dr Learta Codding - will see patient today. MRI pontine CVA -can cause significant hyperthermia. Cannot r/o leptomeningeal disease in setting of AMS Appreciate hospital medicine, ID, neurology input.  Sullivan Lone MD MS

## 2018-03-27 NOTE — Progress Notes (Signed)
PCCM INTERVAL PROGRESS NOTE  57 year old female with history of metastatic breast Ca and new AMS. Seems like this has been progressive throughout the day. RN reports patient is unresponsive and having sonorous cheyne-stoke respirations. Upon my evaluation this is confirmed. She is poorly protecting her airway and has pooled secretions in her posterior oropharynx and is a significant aspiration risk. The decision has been made to proceed with intubation. This has been discussed with family who agree with the outlined plan.  Plan: STAT intubation Will place PRN sedation orders for now, and discuss with neurology.  CXR ABG  Georgann Housekeeper, AGACNP-BC [place] Pager [phone] or [phone]  03/27/2018 10:27 PM

## 2018-03-27 NOTE — Progress Notes (Signed)
IP PROGRESS NOTE  Subjective:   Olivia Patel is well-known to me with a history of metastatic breast cancer, currently being treated with weekly Taxol/carboplatin chemotherapy.  She was last treated with chemotherapy on 03/03/2018. She was admitted with sinus symptoms and fever on 03/13/2018.  Cultures were negative.  She was discharged to home on 03/18/2018 to complete a course of Augmentin.  She was admitted 02/27/2018 with confusion.  She has a low-grade fever.  An EEG revealed evidence of encephalopathy.  Brain MRI revealed dense of an acute left paramedian pons infarct.  There is also evidence of early acute infarct changes in the left MCA distribution.  She remains confused.  Her family is at the bedside. Objective: Vital signs in last 24 hours: Blood pressure (!) 206/90, pulse 93, temperature 100.3 F (37.9 C), temperature source Oral, resp. rate (!) 34, height _0  (1.6 m), weight 161 lb 2.5 oz (73.1 kg), last menstrual period 05/29/2015, SpO2 99 %.  Intake/Output from previous day: 12/01 0701 - 12/02 0700 In: 2389.8 [I.V.:934; IV Piggyback:1455.9] Out: 600 [Urine:600]  Physical Exam:  HEENT: Neck without mass Lungs: Clear anteriorly, increased respiratory rate Cardiac: Regular rate and rhythm Abdomen: No hepatomegaly, nontender Extremities: No leg edema Breasts: Large mass occupying the majority of the right lateral breast. Nodes: No cervical, supraclavicular, or left axillary nodes.  Firm 1-2 cm node in the right axilla Neurologic: Lethargic, arousable with a sternal rub, not following commands, not speaking in sentences, decreased movement of the right arm and leg compared to the left side  Portacath/PICC-without erythema  Lab Results: Recent Labs    03/26/18 1025 03/27/18 0500  WBC 12.2* 12.2*  HGB 10.0* 8.6*  HCT 31.2* 27.2*  PLT 161 138*    BMET Recent Labs    03/26/18 1025 03/27/18 0500  NA 127* 128*  K 3.3* 3.6  CL 81* 85*  CO2 31 30  GLUCOSE 112*  103*  BUN 11 16  CREATININE 0.66 0.57  CALCIUM 10.5* 9.4    No results found for: CEA1  Studies/Results: Ct Angio Head W Or Wo Contrast  Result Date: 03/26/2018 CLINICAL DATA:  Aphasia. EXAM: CT ANGIOGRAPHY HEAD AND NECK TECHNIQUE: Multidetector CT imaging of the head and neck was performed using the standard protocol during bolus administration of intravenous contrast. Multiplanar CT image reconstructions and MIPs were obtained to evaluate the vascular anatomy. Carotid stenosis measurements (when applicable) are obtained utilizing NASCET criteria, using the distal internal carotid diameter as the denominator. CONTRAST:  142m ISOVUE-370 IOPAMIDOL (ISOVUE-370) INJECTION 76% COMPARISON:  Neck CT 09/19/2017. FINDINGS: The study is mildly to moderately motion degraded. CTA NECK FINDINGS Aortic arch: Normal variant aortic arch branching pattern with common origin of the brachiocephalic and left common carotid arteries. Widely patent arch vessel origins. Right carotid system: Patent without evidence of stenosis or dissection. Left carotid system: Patent without evidence of stenosis or dissection. Vertebral arteries: Patent without stenosis or dissection identified with V2 assessment limited by motion artifact. Slightly dominant right vertebral artery. Skeleton: Known osseous metastatic disease including a 2 cm right frontal skull lesion. Extensive metastatic disease in the spine including a C5 lesion with chronic pathologic fracture. Other neck: No mass or enlarged lymph nodes identified. Upper chest: Bilateral pleural effusions. Review of the MIP images confirms the above findings CTA HEAD FINDINGS Anterior circulation: Internal carotid arteries are patent from skull base to carotid termini with minimal nonstenotic plaque. A 2 mm outpouching is noted from the left supraclinoid ICA in the posterior  communicating region without a vessel clearly seen arising from it. The M1 segments and MCA bifurcations are  widely patent bilaterally. There is MCA branch vessel irregularity and attenuation bilaterally, more notable on the left where there are multiple missing distal M2/M3 and more distal branch vessels, particularly in the inferior division supplying the temporoparietal brain. There is no proximal M2 occlusion. ACAs are patent with moderate branch vessel irregularity but no significant proximal stenosis. Posterior circulation: The intracranial vertebral arteries are patent to the basilar. Patent PICA and SCA origins are identified bilaterally. The basilar artery is widely patent. Posterior communicating arteries are diminutive or absent. PCAs are patent with branch vessel irregularity and attenuation but no significant proximal stenosis. No aneurysm is identified. Venous sinuses: As permitted by contrast timing, patent. Anatomic variants: None. Delayed phase: Possible early loss of cortical gray-white differentiation in the posterior left temporal lobe with assessment limited by motion artifact. No gross enhancing mass lesion. Review of the MIP images confirms the above findings IMPRESSION: 1. Motion degraded examination without large vessel occlusion. 2. Multiple missing distal left MCA branch vessels with possible early changes of acute posterior left temporal lobe infarct. 3. Widely patent carotid arteries. 4. 2 mm left supraclinoid ICA aneurysm versus infundibulum. 5. Widespread osseous metastatic disease. The study was reviewed in person with Dr. Leonel Ramsay on 03/26/2018 at 3:05 p.m. Electronically Signed   By: Lassen Bores M.D.   On: 03/26/2018 15:23   Ct Head Wo Contrast  Result Date: 03/26/2018 CLINICAL DATA:  Metastatic breast cancer, fever, rapid response, altered mental status EXAM: CT HEAD WITHOUT CONTRAST TECHNIQUE: Contiguous axial images were obtained from the base of the skull through the vertex without intravenous contrast. COMPARISON:  09/17/2017 FINDINGS: Brain: Diffuse patchy white matter  hypoattenuation bilaterally throughout both cerebral hemispheres compatible with chronic white matter microvascular changes, slight progression compared to the prior study. No acute intracranial hemorrhage, midline shift, large mass lesion, definite new infarction, or extra-axial fluid collection. No focal mass effect or edema. Cisterns are patent. No gross cerebellar abnormality. Exam is limited with some motion artifact. Vascular: No hyperdense vessel or unexpected calcification. Skull: Previously described tiny lucent areas throughout the skull are less apparent. Patient does have a history of osseous metastatic disease. No displaced or depressed fracture. Sinuses/Orbits: Orbits are symmetric. Minor right maxillary sinus mucosal thickening. Otherwise sinuses and mastoids are clear. Other: None. IMPRESSION: Limited with motion artifact and without IV contrast. This limits evaluation for intracranial metastases. Slight progression of white matter microvascular changes. No acute intracranial hemorrhage or mass effect. Electronically Signed   By: Jerilynn Mages.  Shick M.D.   On: 03/26/2018 08:51   Ct Angio Neck W Or Wo Contrast  Result Date: 03/26/2018 CLINICAL DATA:  Aphasia. EXAM: CT ANGIOGRAPHY HEAD AND NECK TECHNIQUE: Multidetector CT imaging of the head and neck was performed using the standard protocol during bolus administration of intravenous contrast. Multiplanar CT image reconstructions and MIPs were obtained to evaluate the vascular anatomy. Carotid stenosis measurements (when applicable) are obtained utilizing NASCET criteria, using the distal internal carotid diameter as the denominator. CONTRAST:  166m ISOVUE-370 IOPAMIDOL (ISOVUE-370) INJECTION 76% COMPARISON:  Neck CT 09/19/2017. FINDINGS: The study is mildly to moderately motion degraded. CTA NECK FINDINGS Aortic arch: Normal variant aortic arch branching pattern with common origin of the brachiocephalic and left common carotid arteries. Widely patent arch  vessel origins. Right carotid system: Patent without evidence of stenosis or dissection. Left carotid system: Patent without evidence of stenosis or dissection. Vertebral arteries: Patent  without stenosis or dissection identified with V2 assessment limited by motion artifact. Slightly dominant right vertebral artery. Skeleton: Known osseous metastatic disease including a 2 cm right frontal skull lesion. Extensive metastatic disease in the spine including a C5 lesion with chronic pathologic fracture. Other neck: No mass or enlarged lymph nodes identified. Upper chest: Bilateral pleural effusions. Review of the MIP images confirms the above findings CTA HEAD FINDINGS Anterior circulation: Internal carotid arteries are patent from skull base to carotid termini with minimal nonstenotic plaque. A 2 mm outpouching is noted from the left supraclinoid ICA in the posterior communicating region without a vessel clearly seen arising from it. The M1 segments and MCA bifurcations are widely patent bilaterally. There is MCA branch vessel irregularity and attenuation bilaterally, more notable on the left where there are multiple missing distal M2/M3 and more distal branch vessels, particularly in the inferior division supplying the temporoparietal brain. There is no proximal M2 occlusion. ACAs are patent with moderate branch vessel irregularity but no significant proximal stenosis. Posterior circulation: The intracranial vertebral arteries are patent to the basilar. Patent PICA and SCA origins are identified bilaterally. The basilar artery is widely patent. Posterior communicating arteries are diminutive or absent. PCAs are patent with branch vessel irregularity and attenuation but no significant proximal stenosis. No aneurysm is identified. Venous sinuses: As permitted by contrast timing, patent. Anatomic variants: None. Delayed phase: Possible early loss of cortical gray-white differentiation in the posterior left temporal lobe  with assessment limited by motion artifact. No gross enhancing mass lesion. Review of the MIP images confirms the above findings IMPRESSION: 1. Motion degraded examination without large vessel occlusion. 2. Multiple missing distal left MCA branch vessels with possible early changes of acute posterior left temporal lobe infarct. 3. Widely patent carotid arteries. 4. 2 mm left supraclinoid ICA aneurysm versus infundibulum. 5. Widespread osseous metastatic disease. The study was reviewed in person with Dr. Leonel Ramsay on 03/26/2018 at 3:05 p.m. Electronically Signed   By: Lawhead Bores M.D.   On: 03/26/2018 15:23   Ct Chest W Contrast  Result Date: 03/25/2018 CLINICAL DATA:  Fever of unknown origin.  Stage 4 breast cancer. EXAM: CT CHEST, ABDOMEN, AND PELVIS WITH CONTRAST TECHNIQUE: Multidetector CT imaging of the chest, abdomen and pelvis was performed following the standard protocol during bolus administration of intravenous contrast. CONTRAST:  1109m OMNIPAQUE IOHEXOL 300 MG/ML SOLN, 359mOMNIPAQUE IOHEXOL 300 MG/ML SOLN COMPARISON:  CT chest 12/22/2017 and CT AP 09/19/2017. FINDINGS: CT CHEST FINDINGS Cardiovascular: The heart size appears normal. No pericardial effusion. Mediastinum/Nodes: Right axillary lymph node measures 1.2 cm, image 21/3. Previously 1.1 cm. No supraclavicular adenopathy. No enlarged left axillary lymph nodes. Normal appearance of the thyroid gland. The trachea appears patent and is midline. Small hiatal hernia noted. No mediastinal or hilar adenopathy. Lungs/Pleura: Bilateral pleural effusions identified, increased from previous exam. There is no airspace consolidation identified. Left upper lobe lung nodule measures 6 mm and is unchanged, image 61/5. No additional pulmonary nodules noted. Musculoskeletal: There is diffuse skin thickening overlying the right breast. Right breast mass is again identified containing clip measuring 2.1 cm, image 28/3. Previously 2.6 cm. Mixed lytic and  sclerotic bone metastases are identified throughout the visualized axial and appendicular skeleton. This appears progressive when compared with the previous exam. For example, within the proximal body of sternum there is near complete loss of the anterior cortex, image 82/7. New from previous study. Lucent lesion within the T10 vertebral body measures 1.1 cm, image 84/7. Previously 0.5  cm. Lucent lesion involving the T9 vertebra with pathologic fracture measures 1.2 cm, image 90/7. Previously 0.4 cm. T3 lucent lesion measures 1.7 cm, image 84/7. New from previous exam. CT ABDOMEN PELVIS FINDINGS Hepatobiliary: There are at least 5 ill-defined lucent lesions within the liver which are suspicious for metastatic disease. The largest is in segment 7 measuring 2.2 cm. Not previously characterized. Gallbladder normal. No biliary dilatation. Pancreas: Unremarkable. No pancreatic ductal dilatation or surrounding inflammatory changes. Spleen: Normal in size without focal abnormality. Adrenals/Urinary Tract: Adrenal glands are unremarkable. Kidneys are normal, without renal calculi, focal lesion, or hydronephrosis. Bladder is unremarkable. Stomach/Bowel: Small hiatal hernia. The small bowel loops have a normal course and caliber. No obstruction. No pathologic dilatation of the colon. Similar appearance of the appendix which contains gas within its lumen measuring 8 mm in diameter. Mild periappendiceal haziness noted, image 71/6. No pathologic dilatation of the colon. Vascular/Lymphatic: Normal appearance of the abdominal aorta. No enlarged abdominopelvic lymph nodes. Reproductive: Partially calcified and enlarged fibroid uterus noted. Other: Small amount of free fluid noted within the pelvis. Musculoskeletal: Extensive lytic bone metastases are identified. This has increased when compared with the previous exam. New pathologic fracture involves the L4 vertebra. IMPRESSION: 1. No highly specific findings identified to indicate  patient's fever of unknown origin. The appendix is visualized and mildly increased in caliber measuring 8 mm. There is mild periappendiceal haziness is well. No significant periappendiceal free fluid identified. Correlation for any clinical signs or symptoms of appendicitis advised. 2. There has been interval progression of diffuse lytic bone metastases with new pathologic fractures involving T9 and L4 vertebra. 3. Multiple low-attenuation lesions within the liver, worrisome for metastatic disease. Not previously characterized. Electronically Signed   By: Kerby Moors M.D.   On: 03/25/2018 18:17   Mr Brain Wo Contrast  Addendum Date: 03/26/2018   ADDENDUM REPORT: 03/26/2018 15:37 ADDENDUM: Taking into account the findings on the subsequent CTA, some of the asymmetric diffusion signal in the left MCA territory originally attributed to artifact may in fact reflect early changes of acute infarct, specifically in the lateral left temporal lobe, left insula, and possibly operculum. This MRI was reviewed again with Dr. Leonel Ramsay in person at the time of CTA interpretation. Electronically Signed   By: Kite Bores M.D.   On: 03/26/2018 15:37   Result Date: 03/26/2018 CLINICAL DATA:  Aphasia.  History of breast cancer. EXAM: MRI HEAD WITHOUT CONTRAST TECHNIQUE: Multiplanar, multiecho pulse sequences of the brain and surrounding structures were obtained without intravenous contrast. COMPARISON:  Head CT 03/26/2018 and MRI 09/19/2017 FINDINGS: The examination was aborted due to patient agitation. Only a moderately motion degraded axial diffusion sequence was obtained. There is a 6 mm focus of restricted diffusion in the left paramedian pons consistent with acute infarct. Supratentorial signal is mildly asymmetric between the left and right cerebral hemispheres which is felt to be due to artifact without a convincing sizable acute infarct identified. A chronic right frontal skull lesion is incompletely evaluated.  IMPRESSION: Motion degraded diffusion only examination demonstrating a 6 mm acute infarct (less likely metastasis) in the left pons. No convincing acute supratentorial infarct. The study was reviewed in person with Dr. Leonel Ramsay on 03/26/2018 at 21:58 p.m. Electronically Signed: By: Pressley Bores M.D. On: 03/26/2018 14:18   Ct Abdomen Pelvis W Contrast  Result Date: 03/25/2018 CLINICAL DATA:  Fever of unknown origin.  Stage 4 breast cancer. EXAM: CT CHEST, ABDOMEN, AND PELVIS WITH CONTRAST TECHNIQUE: Multidetector CT imaging of the  chest, abdomen and pelvis was performed following the standard protocol during bolus administration of intravenous contrast. CONTRAST:  18m OMNIPAQUE IOHEXOL 300 MG/ML SOLN, 316mOMNIPAQUE IOHEXOL 300 MG/ML SOLN COMPARISON:  CT chest 12/22/2017 and CT AP 09/19/2017. FINDINGS: CT CHEST FINDINGS Cardiovascular: The heart size appears normal. No pericardial effusion. Mediastinum/Nodes: Right axillary lymph node measures 1.2 cm, image 21/3. Previously 1.1 cm. No supraclavicular adenopathy. No enlarged left axillary lymph nodes. Normal appearance of the thyroid gland. The trachea appears patent and is midline. Small hiatal hernia noted. No mediastinal or hilar adenopathy. Lungs/Pleura: Bilateral pleural effusions identified, increased from previous exam. There is no airspace consolidation identified. Left upper lobe lung nodule measures 6 mm and is unchanged, image 61/5. No additional pulmonary nodules noted. Musculoskeletal: There is diffuse skin thickening overlying the right breast. Right breast mass is again identified containing clip measuring 2.1 cm, image 28/3. Previously 2.6 cm. Mixed lytic and sclerotic bone metastases are identified throughout the visualized axial and appendicular skeleton. This appears progressive when compared with the previous exam. For example, within the proximal body of sternum there is near complete loss of the anterior cortex, image 82/7. New from  previous study. Lucent lesion within the T10 vertebral body measures 1.1 cm, image 84/7. Previously 0.5 cm. Lucent lesion involving the T9 vertebra with pathologic fracture measures 1.2 cm, image 90/7. Previously 0.4 cm. T3 lucent lesion measures 1.7 cm, image 84/7. New from previous exam. CT ABDOMEN PELVIS FINDINGS Hepatobiliary: There are at least 5 ill-defined lucent lesions within the liver which are suspicious for metastatic disease. The largest is in segment 7 measuring 2.2 cm. Not previously characterized. Gallbladder normal. No biliary dilatation. Pancreas: Unremarkable. No pancreatic ductal dilatation or surrounding inflammatory changes. Spleen: Normal in size without focal abnormality. Adrenals/Urinary Tract: Adrenal glands are unremarkable. Kidneys are normal, without renal calculi, focal lesion, or hydronephrosis. Bladder is unremarkable. Stomach/Bowel: Small hiatal hernia. The small bowel loops have a normal course and caliber. No obstruction. No pathologic dilatation of the colon. Similar appearance of the appendix which contains gas within its lumen measuring 8 mm in diameter. Mild periappendiceal haziness noted, image 71/6. No pathologic dilatation of the colon. Vascular/Lymphatic: Normal appearance of the abdominal aorta. No enlarged abdominopelvic lymph nodes. Reproductive: Partially calcified and enlarged fibroid uterus noted. Other: Small amount of free fluid noted within the pelvis. Musculoskeletal: Extensive lytic bone metastases are identified. This has increased when compared with the previous exam. New pathologic fracture involves the L4 vertebra. IMPRESSION: 1. No highly specific findings identified to indicate patient's fever of unknown origin. The appendix is visualized and mildly increased in caliber measuring 8 mm. There is mild periappendiceal haziness is well. No significant periappendiceal free fluid identified. Correlation for any clinical signs or symptoms of appendicitis advised.  2. There has been interval progression of diffuse lytic bone metastases with new pathologic fractures involving T9 and L4 vertebra. 3. Multiple low-attenuation lesions within the liver, worrisome for metastatic disease. Not previously characterized. Electronically Signed   By: TaKerby Moors.D.   On: 03/25/2018 18:17   Dg Chest Port 1 View  Result Date: 03/27/2018 CLINICAL DATA:  Fever and shortness of breath EXAM: PORTABLE CHEST 1 VIEW COMPARISON:  Yesterday FINDINGS: Porta catheter on the left with tip at the upper cavoatrial junction. Low volumes with interstitial coarsening and hazy density at the left base. There is improved aeration at the left base with better visualized diaphragm. Healing right-sided rib fractures. Known osseous metastatic disease. IMPRESSION: 1. Mild improvement in  aeration at the left base. 2. Small pleural effusions known by recent chest CT. There is mildly improved aeration at the left base. Electronically Signed   By: Monte Fantasia M.D.   On: 03/27/2018 09:56   Dg Chest Port 1 View  Result Date: 03/26/2018 CLINICAL DATA:  Increased rate of breathing. EXAM: PORTABLE CHEST 1 VIEW COMPARISON:  CT yesterday. Radiograph 02/25/2018 FINDINGS: Left chest port tip in the SVC. Bilateral pleural effusions and adjacent atelectasis, more apparent on the left and progressed from prior radiograph. Normal heart size and mediastinal contours. Mild vascular congestion without overt pulmonary edema. No pneumothorax. Again seen bilateral rib fractures, some with have callus formation. IMPRESSION: Vascular congestion. Bilateral pleural effusions and adjacent atelectasis, more apparent on the left, likely increased from radiograph 2 days ago. Electronically Signed   By: Keith Rake M.D.   On: 03/26/2018 06:19    Medications: I have reviewed the patient's current medications.  Assessment/Plan: 1. Metastatic breast cancer  Large right breast mass with diffuse skin thickening  CTs  09/19/2017 with mediastinal/hilar adenopathy, subcarinal adenopathy, widespread bone lesions, right breast mass with skin thickening  MRI of the brain 09/19/2017-negative for brain metastases  Ultrasound-guided biopsy of the right breast mass on 09/21/2017, invasive mammary carcinoma, HER-2 negative, ER negative, PR negative,Ki-67 90%  Weekly Taxol initiated 09/30/2017  Week 2 Taxol 10/07/2017  Week 3 Taxol/carboplatin added 10/14/2017  Cycle 2 weekly Taxol/carboplatin beginning 10/28/2017  Cycle 3 weekly Taxol/carboplatin beginning 11/25/2017  CT chest 12/22/2017-decrease in right breast mass and right axillary lymphadenopathy, unchanged bone metastases, 2 subcentimeter low-attenuation liver lesions- no direct IV contrast CT available for comparison  Cycle 4 weekly Taxol/carboplatin beginning 12/23/2017  Cycle 5 weekly Taxol/carboplatin beginning 01/20/2018  Cycle 6 weeklyTaxol/carboplatin beginning 02/17/2018 2.Hypercalcemia, status post pamidronate 09/17/2017, calcitonin initiated 09/18/2017, Zometa 09/24/2017,Zometa 10/07/2017; 3.Dehydration secondary to hypercalcemia, resolved 4.Lytic bone lesions, rib fractures secondary to #1 5.Hypertension 6. Renal failure-? Secondary to ATN,resolved 7. [facility] hospital admission 09/17/2017-cultures negative, no apparent source for infection, tumor fever? 8. Anemia secondary to metastatic breast cancer and phlebotomy- 1 unit of packed red blood cells 09/30/2017;improved 10/07/2017.Progressive anemia 11/04/2017. Transfused 1 unit of packed red blood cells. 9.  Admission 03/13/2018 with a fever and sinus symptoms, status post treatment with antibiotics, cultures negative 10.  Admission 03/18/2018 with altered mental status- MRI brain with evidence of  left MCA distribution CVAs  Olivia Patel is admitted with fever and altered mental status.  The neurologic evaluation to date is consistent with acute left brain CVAs.  This could explain her  altered mental status, but I remain concerned she could have CNS tumor progression.  Carcinomatous meningitis? The calcium is mildly elevated.  She has a history of hypercalcemia malignancy.  I will check an ionized calcium and give biphosphonate therapy if elevated.  The right breast mass has responded to chemotherapy, but she was noted to have liver metastases on an abdominal CT 03/25/2018.  The fever may be in part to the liver metastases and "tumor "fever.  Recommendations: 1.  Continue evaluation of the altered mental status per neurology, consider lumbar puncture 2.   Check ionized calcium 3.   Chemotherapy for breast cancer will be placed on hold  LOS: 3 days   Betsy Coder, MD   03/27/2018, 2:10 PM

## 2018-03-27 NOTE — Evaluation (Signed)
Clinical/Bedside Swallow Evaluation Patient Details  Name: Olivia Patel MRN: [id number] Date of Birth: [birth year]  Today's Date: 03/27/2018 Time: SLP Start Time (ACUTE ONLY): 34 SLP Stop Time (ACUTE ONLY): 1335 SLP Time Calculation (min) (ACUTE ONLY): 26 min  Past Medical History:  Past Medical History:  Diagnosis Date  . Anemia   . Arthritis   . Cancer ([place])   . Hypertension    Past Surgical History:  Past Surgical History:  Procedure Laterality Date  . IR IMAGING GUIDED PORT INSERTION  10/25/2017  . TUBAL LIGATION     HPI:  57 yo female adm to [facility] with AMS = diagnosed with SIRS.  Pt is stage IV breast cancer, ongoing chemotherapy, recently admited with fever 11/18-11/23.  Family states she was congested breathing.  CXR showed vascular congested with ATX left more than right.  Pt with fever 100.1.  Pt imaging studies showed possible liver mets, small hiatal hernia, pathological fractures T9+L4.  Pt imaging studies probable/possible early temporal cva, osseous metastatic dx widespread.  Per critical care service, pt with cheynne stokes breathing pattern. Swallow and cognitive evaluation ordered.     Assessment / Plan / Recommendation Clinical Impression  Pt did not follow directions for oral motor exam and it was difficult to get her to open her mouth.  Secretions retained in right sulci upon walking into room.  SLP provided oral care to pt using toohette and oral suction with pt holding suction hand over hand. She is resistant to SlP providing oral care.  Also noted Cheynne Stokes respirations with RR up to 50s and down to 24 during session concerning for brain stem involvement.   SlP provided pt with only single tiny icechip, trace bit of water and moisture.  After SLP was finally able to get boluses into oral cavity, pt with delayed swallow when it was elicited.  Recommend NPO at this time due to concern for dysphagia/aspiration.  Oral moisture after oral care advised.  Educated  family and pt to finding/recommendations.  Concern present for pt's swallow prognosis given report from family of several weeks of congestion/gurgly respirations.   SLP Visit Diagnosis: Dysphagia, oropharyngeal phase (R13.12)    Aspiration Risk  Severe aspiration risk;Risk for inadequate nutrition/hydration    Diet Recommendation NPO   Liquid Administration via: (oral moisture) Medication Administration: Via alternative means    Other  Recommendations     Follow up Recommendations    tbd    Frequency and Duration min 1 x/week  1 week       Prognosis Prognosis for Safe Diet Advancement: Guarded Barriers to Reach Goals: Other (Comment)(medical diagnosis)      Swallow Study   General Date of Onset: 03/27/18 HPI: 57 yo female adm to [facility] with AMS = diagnosed with SIRS.  Pt is stage IV breast cancer, ongoing chemotherapy, recently admited with fever 11/18-11/23.  Family states she was congested breathing.  CXR showed vascular congested with ATX left more than right.  Pt with fever 100.1.  Pt imaging studies showed possible liver mets, small hiatal hernia, pathological fractures T9+L4.  Pt imaging studies probable/possible early temporal cva, osseous metastatic dx widespread.  Per critical care service, pt with cheynne stokes breathing pattern. Swallow and cognitive evaluation ordered.   Type of Study: Bedside Swallow Evaluation Diet Prior to this Study: NPO Temperature Spikes Noted: Yes Respiratory Status: Nasal cannula History of Recent Intubation: No Behavior/Cognition: Lethargic/Drowsy;Distractible;Doesn't follow directions Oral Care Completed by SLP: Yes Oral Cavity - Dentition: Poor condition Vision: (left  gaze preference) Self-Feeding Abilities: Total assist Patient Positioning: Upright in bed Baseline Vocal Quality: Other (comment)(voice heard x1 only, weak) Volitional Cough: Cognitively unable to elicit Volitional Swallow: Unable to elicit    Oral/Motor/Sensory Function  Overall Oral Motor/Sensory Function: (pt did not follows directions= ? right facial droop)   Ice Chips Ice chips: Impaired Oral Phase Impairments: Reduced lingual movement/coordination;Poor awareness of bolus;Reduced labial seal Oral Phase Functional Implications: Prolonged oral transit;Oral holding Pharyngeal Phase Impairments: Suspected delayed Swallow   Thin Liquid Thin Liquid: Impaired Presentation: Spoon Oral Phase Impairments: Reduced lingual movement/coordination;Poor awareness of bolus Oral Phase Functional Implications: Prolonged oral transit Pharyngeal  Phase Impairments: Suspected delayed Swallow    Nectar Thick Nectar Thick Liquid: Not tested   Honey Thick Honey Thick Liquid: Not tested   Puree Puree: Not tested   Solid     Solid: Not tested      Macario Golds 03/27/2018,2:03 PM  Luanna Salk, MS [facility] SLP Acute Rehab Services Pager [phone] Office [phone]

## 2018-03-27 NOTE — Progress Notes (Signed)
Addendum  Paged by RN to bedside due to new onset of seizure-like activity upon returning from LP.  Arrived at bedside.  No current seizures.  Patient awake and alert, tracking activities around her with her eyes.  As per RN report, patient had an gaze to the right with twitching of the right side of her face.  I discussed with Dr. Malen Gauze at bedside, concern for seizure disorder, rule out status- PRN IV Ativan for seizures, seizure precautions, loading with Keppra and starting maintenance Keppra.  Also concern for viral encephalitis, starting acyclovir, checking HSV PCR in CSF, will need MRI brain under sedation in which case may need airway management prior.  He also recommends transferring patient to neuro ICU at [facility] for close evaluation, further management.  I discussed with Marni Griffon, CCM, updated care and regarding high risk for need for intubation and mechanical ventilation.  He discussed with his team and agreeable to transfer to [facility] under their PCCM service/Dr. Kipp Brood as sttending.  Oncology input appreciated.  Discussed in detail with patient's sister and niece at bedside.  Also discussed care with nursing.  Transfer orders placed.  Vernell Leep, MD, FACP, [facility]. [facility] Pager [phone]  If 7PM-7AM, please contact night-coverage [email or web address] Password [facility] 03/27/2018, 5:28 PM

## 2018-03-27 NOTE — Progress Notes (Signed)
[facility]    Date of Admission:  03/11/2018   Total days of antibiotics 4        Day 4 cefepime/vancomycin        Day 2 amp        Day 1 acyclovir   ID: Olivia Patel is a 57 y.o. female with  history of metastatic breast cancer, currently being treated with weekly Taxol/carboplatin chemotherapy.  She was last treated with chemotherapy on 03/03/2018.admitted for SIRS, obtunded, with seizure like events. Active Problems:   Essential hypertension   Anemia   Hypercalcemia   Breast cancer ([place])   Port-A-Cath in place   SIRS (systemic inflammatory response syndrome) ([facility])   Sepsis ([place])   Acute ischemic stroke ([place])   Cheyne-Stokes respiration    Subjective: Remains febrile at 100.3, aphasic, having seizure like episodes per neurology. She did undergo LP, thus far glu <20 and WBC 21. Gram stain pending.she also underwent mri that showed signs of early acute infarct changes in the left MCA distribution  Medications:  . aspirin  300 mg Rectal Daily  . enoxaparin (LOVENOX) injection  40 mg Subcutaneous Q24H  . fluticasone  2 spray Each Nare Daily    Objective: Vital signs in last 24 hours: Temp:  [98.8 F (37.1 C)-100.3 F (37.9 C)] 99.9 F (37.7 C) (12/02 1638) Pulse Rate:  [75-107] 98 (12/02 1730) Resp:  [0-53] 38 (12/02 1730) BP: (159-239)/(76-179) 192/96 (12/02 1730) SpO2:  [68 %-100 %] 93 % (12/02 1730) Weight:  [73.1 kg] 73.1 kg (12/02 0500)  Physical Exam  Constitutional:   Appears chronically ill and under-nourished.   HENT: [place]/AT, eyes open, left gaze preference no scleral icterus Mouth/Throat: Oropharynx is clear and moist. No oropharyngeal exudate.  Cardiovascular: Normal rate, regular rhythm and normal heart sounds. Exam reveals no gallop and no friction rub.  No murmur heard.  Pulmonary/Chest: Effort normal and breath sounds normal. No respiratory distress.  has no wheezes.  Neck = supple, no nuchal rigidity Abdominal: Soft.  Bowel sounds are normal.  exhibits no distension. There is no tenderness.  Lymphadenopathy: no cervical adenopathy. right axillary adenopathy + Neurological: does not follow commands. Left gaze preference Skin: Skin is warm and dry. No rash noted. No erythema.     Lab Results Recent Labs    03/26/18 1025 03/27/18 0500  WBC 12.2* 12.2*  HGB 10.0* 8.6*  HCT 31.2* 27.2*  NA 127* 128*  K 3.3* 3.6  CL 81* 85*  CO2 31 30  BUN 11 16  CREATININE 0.66 0.57   Liver Panel Recent Labs    03/25/18 0425 03/26/18 1025  PROT 6.9 8.1  ALBUMIN 2.7* 3.1*  AST 26 25  ALT 26 24  ALKPHOS 168* 176*  BILITOT 1.1 1.0   Sedimentation Rate No results for input(s): ESRSEDRATE in the last 72 hours. C-Reactive Protein Recent Labs    03/26/18 0336  CRP 30.7*    Microbiology: Blood cx: 12/1 ngtd 11/29 ngtd 11/20 ngtd Studies/Results: Ct Angio Head W Or Wo Contrast  Result Date: 03/26/2018 CLINICAL DATA:  Aphasia. EXAM: CT ANGIOGRAPHY HEAD AND NECK TECHNIQUE: Multidetector CT imaging of the head and neck was performed using the standard protocol during bolus administration of intravenous contrast. Multiplanar CT image reconstructions and MIPs were obtained to evaluate the vascular anatomy. Carotid stenosis measurements (when applicable) are obtained utilizing NASCET criteria, using the distal internal carotid diameter as the denominator. CONTRAST:  11mL ISOVUE-370 IOPAMIDOL (ISOVUE-370) INJECTION 76% COMPARISON:  Neck CT 09/19/2017. FINDINGS: The study is mildly to moderately motion degraded. CTA NECK FINDINGS Aortic arch: Normal variant aortic arch branching pattern with common origin of the brachiocephalic and left common carotid arteries. Widely patent arch vessel origins. Right carotid system: Patent without evidence of stenosis or dissection. Left carotid system: Patent without evidence of stenosis or dissection. Vertebral arteries: Patent without stenosis or dissection identified with V2  assessment limited by motion artifact. Slightly dominant right vertebral artery. Skeleton: Known osseous metastatic disease including a 2 cm right frontal skull lesion. Extensive metastatic disease in the spine including a C5 lesion with chronic pathologic fracture. Other neck: No mass or enlarged lymph nodes identified. Upper chest: Bilateral pleural effusions. Review of the MIP images confirms the above findings CTA HEAD FINDINGS Anterior circulation: Internal carotid arteries are patent from skull base to carotid termini with minimal nonstenotic plaque. A 2 mm outpouching is noted from the left supraclinoid ICA in the posterior communicating region without a vessel clearly seen arising from it. The M1 segments and MCA bifurcations are widely patent bilaterally. There is MCA branch vessel irregularity and attenuation bilaterally, more notable on the left where there are multiple missing distal M2/M3 and more distal branch vessels, particularly in the inferior division supplying the temporoparietal brain. There is no proximal M2 occlusion. ACAs are patent with moderate branch vessel irregularity but no significant proximal stenosis. Posterior circulation: The intracranial vertebral arteries are patent to the basilar. Patent PICA and SCA origins are identified bilaterally. The basilar artery is widely patent. Posterior communicating arteries are diminutive or absent. PCAs are patent with branch vessel irregularity and attenuation but no significant proximal stenosis. No aneurysm is identified. Venous sinuses: As permitted by contrast timing, patent. Anatomic variants: None. Delayed phase: Possible early loss of cortical gray-white differentiation in the posterior left temporal lobe with assessment limited by motion artifact. No gross enhancing mass lesion. Review of the MIP images confirms the above findings IMPRESSION: 1. Motion degraded examination without large vessel occlusion. 2. Multiple missing distal left  MCA branch vessels with possible early changes of acute posterior left temporal lobe infarct. 3. Widely patent carotid arteries. 4. 2 mm left supraclinoid ICA aneurysm versus infundibulum. 5. Widespread osseous metastatic disease. The study was reviewed in person with Dr. Leonel Ramsay on 03/26/2018 at 3:05 p.m. Electronically Signed   By: Castiglia Bores M.D.   On: 03/26/2018 15:23   Ct Head Wo Contrast  Result Date: 03/26/2018 CLINICAL DATA:  Metastatic breast cancer, fever, rapid response, altered mental status EXAM: CT HEAD WITHOUT CONTRAST TECHNIQUE: Contiguous axial images were obtained from the base of the skull through the vertex without intravenous contrast. COMPARISON:  09/17/2017 FINDINGS: Brain: Diffuse patchy white matter hypoattenuation bilaterally throughout both cerebral hemispheres compatible with chronic white matter microvascular changes, slight progression compared to the prior study. No acute intracranial hemorrhage, midline shift, large mass lesion, definite new infarction, or extra-axial fluid collection. No focal mass effect or edema. Cisterns are patent. No gross cerebellar abnormality. Exam is limited with some motion artifact. Vascular: No hyperdense vessel or unexpected calcification. Skull: Previously described tiny lucent areas throughout the skull are less apparent. Patient does have a history of osseous metastatic disease. No displaced or depressed fracture. Sinuses/Orbits: Orbits are symmetric. Minor right maxillary sinus mucosal thickening. Otherwise sinuses and mastoids are clear. Other: None. IMPRESSION: Limited with motion artifact and without IV contrast. This limits evaluation for intracranial metastases. Slight progression of white matter microvascular changes. No acute intracranial hemorrhage or mass effect.  Electronically Signed   By: Jerilynn Mages.  Shick M.D.   On: 03/26/2018 08:51   Ct Angio Neck W Or Wo Contrast  Result Date: 03/26/2018 CLINICAL DATA:  Aphasia. EXAM: CT  ANGIOGRAPHY HEAD AND NECK TECHNIQUE: Multidetector CT imaging of the head and neck was performed using the standard protocol during bolus administration of intravenous contrast. Multiplanar CT image reconstructions and MIPs were obtained to evaluate the vascular anatomy. Carotid stenosis measurements (when applicable) are obtained utilizing NASCET criteria, using the distal internal carotid diameter as the denominator. CONTRAST:  114mL ISOVUE-370 IOPAMIDOL (ISOVUE-370) INJECTION 76% COMPARISON:  Neck CT 09/19/2017. FINDINGS: The study is mildly to moderately motion degraded. CTA NECK FINDINGS Aortic arch: Normal variant aortic arch branching pattern with common origin of the brachiocephalic and left common carotid arteries. Widely patent arch vessel origins. Right carotid system: Patent without evidence of stenosis or dissection. Left carotid system: Patent without evidence of stenosis or dissection. Vertebral arteries: Patent without stenosis or dissection identified with V2 assessment limited by motion artifact. Slightly dominant right vertebral artery. Skeleton: Known osseous metastatic disease including a 2 cm right frontal skull lesion. Extensive metastatic disease in the spine including a C5 lesion with chronic pathologic fracture. Other neck: No mass or enlarged lymph nodes identified. Upper chest: Bilateral pleural effusions. Review of the MIP images confirms the above findings CTA HEAD FINDINGS Anterior circulation: Internal carotid arteries are patent from skull base to carotid termini with minimal nonstenotic plaque. A 2 mm outpouching is noted from the left supraclinoid ICA in the posterior communicating region without a vessel clearly seen arising from it. The M1 segments and MCA bifurcations are widely patent bilaterally. There is MCA branch vessel irregularity and attenuation bilaterally, more notable on the left where there are multiple missing distal M2/M3 and more distal branch vessels,  particularly in the inferior division supplying the temporoparietal brain. There is no proximal M2 occlusion. ACAs are patent with moderate branch vessel irregularity but no significant proximal stenosis. Posterior circulation: The intracranial vertebral arteries are patent to the basilar. Patent PICA and SCA origins are identified bilaterally. The basilar artery is widely patent. Posterior communicating arteries are diminutive or absent. PCAs are patent with branch vessel irregularity and attenuation but no significant proximal stenosis. No aneurysm is identified. Venous sinuses: As permitted by contrast timing, patent. Anatomic variants: None. Delayed phase: Possible early loss of cortical gray-white differentiation in the posterior left temporal lobe with assessment limited by motion artifact. No gross enhancing mass lesion. Review of the MIP images confirms the above findings IMPRESSION: 1. Motion degraded examination without large vessel occlusion. 2. Multiple missing distal left MCA branch vessels with possible early changes of acute posterior left temporal lobe infarct. 3. Widely patent carotid arteries. 4. 2 mm left supraclinoid ICA aneurysm versus infundibulum. 5. Widespread osseous metastatic disease. The study was reviewed in person with Dr. Leonel Ramsay on 03/26/2018 at 3:05 p.m. Electronically Signed   By: Clewis Bores M.D.   On: 03/26/2018 15:23   Mr Brain Wo Contrast  Addendum Date: 03/26/2018   ADDENDUM REPORT: 03/26/2018 15:37 ADDENDUM: Taking into account the findings on the subsequent CTA, some of the asymmetric diffusion signal in the left MCA territory originally attributed to artifact may in fact reflect early changes of acute infarct, specifically in the lateral left temporal lobe, left insula, and possibly operculum. This MRI was reviewed again with Dr. Leonel Ramsay in person at the time of CTA interpretation. Electronically Signed   By: Sharman Bores M.D.   On:  03/26/2018 15:37   Result  Date: 03/26/2018 CLINICAL DATA:  Aphasia.  History of breast cancer. EXAM: MRI HEAD WITHOUT CONTRAST TECHNIQUE: Multiplanar, multiecho pulse sequences of the brain and surrounding structures were obtained without intravenous contrast. COMPARISON:  Head CT 03/26/2018 and MRI 09/19/2017 FINDINGS: The examination was aborted due to patient agitation. Only a moderately motion degraded axial diffusion sequence was obtained. There is a 6 mm focus of restricted diffusion in the left paramedian pons consistent with acute infarct. Supratentorial signal is mildly asymmetric between the left and right cerebral hemispheres which is felt to be due to artifact without a convincing sizable acute infarct identified. A chronic right frontal skull lesion is incompletely evaluated. IMPRESSION: Motion degraded diffusion only examination demonstrating a 6 mm acute infarct (less likely metastasis) in the left pons. No convincing acute supratentorial infarct. The study was reviewed in person with Dr. Leonel Ramsay on 03/26/2018 at 21:58 p.m. Electronically Signed: By: Medlock Bores M.D. On: 03/26/2018 14:18   Dg Chest Port 1 View  Result Date: 03/27/2018 CLINICAL DATA:  Fever and shortness of breath EXAM: PORTABLE CHEST 1 VIEW COMPARISON:  Yesterday FINDINGS: Porta catheter on the left with tip at the upper cavoatrial junction. Low volumes with interstitial coarsening and hazy density at the left base. There is improved aeration at the left base with better visualized diaphragm. Healing right-sided rib fractures. Known osseous metastatic disease. IMPRESSION: 1. Mild improvement in aeration at the left base. 2. Small pleural effusions known by recent chest CT. There is mildly improved aeration at the left base. Electronically Signed   By: Monte Fantasia M.D.   On: 03/27/2018 09:56   Dg Chest Port 1 View  Result Date: 03/26/2018 CLINICAL DATA:  Increased rate of breathing. EXAM: PORTABLE CHEST 1 VIEW COMPARISON:  CT yesterday.  Radiograph 03/02/2018 FINDINGS: Left chest port tip in the SVC. Bilateral pleural effusions and adjacent atelectasis, more apparent on the left and progressed from prior radiograph. Normal heart size and mediastinal contours. Mild vascular congestion without overt pulmonary edema. No pneumothorax. Again seen bilateral rib fractures, some with have callus formation. IMPRESSION: Vascular congestion. Bilateral pleural effusions and adjacent atelectasis, more apparent on the left, likely increased from radiograph 2 days ago. Electronically Signed   By: Keith Rake M.D.   On: 03/26/2018 06:19   Vas [place] Lower Extremity Venous (dvt)  Result Date: 03/27/2018  Lower Venous Study Indications: FUO.  Limitations: Body habitus, poor ultrasound/tissue interface and patient immobility, patient movement. Performing Technologist: Oliver Hum RVT  Examination Guidelines: A complete evaluation includes B-mode imaging, spectral Doppler, color Doppler, and power Doppler as needed of all accessible portions of each vessel. Bilateral testing is considered an integral part of a complete examination. Limited examinations for reoccurring indications may be performed as noted.  Right Venous Findings: +---------+---------------+---------+-----------+----------+-------+          CompressibilityPhasicitySpontaneityPropertiesSummary +---------+---------------+---------+-----------+----------+-------+ CFV      Full           Yes      Yes                          +---------+---------------+---------+-----------+----------+-------+ SFJ      Full                                                 +---------+---------------+---------+-----------+----------+-------+ FV Prox  Full                                                 +---------+---------------+---------+-----------+----------+-------+ FV Mid   Full                                                  +---------+---------------+---------+-----------+----------+-------+ FV DistalFull                                                 +---------+---------------+---------+-----------+----------+-------+ PFV      Full                                                 +---------+---------------+---------+-----------+----------+-------+ POP      Full           Yes      Yes                          +---------+---------------+---------+-----------+----------+-------+ PTV      Full                                                 +---------+---------------+---------+-----------+----------+-------+ PERO     Full                                                 +---------+---------------+---------+-----------+----------+-------+  Left Venous Findings: +---------+---------------+---------+-----------+----------+-------+          CompressibilityPhasicitySpontaneityPropertiesSummary +---------+---------------+---------+-----------+----------+-------+ CFV      Full           Yes      Yes                          +---------+---------------+---------+-----------+----------+-------+ SFJ      Full                                                 +---------+---------------+---------+-----------+----------+-------+ FV Prox  Full                                                 +---------+---------------+---------+-----------+----------+-------+ FV Mid   Full                                                 +---------+---------------+---------+-----------+----------+-------+  FV DistalFull                                                 +---------+---------------+---------+-----------+----------+-------+ PFV      Full                                                 +---------+---------------+---------+-----------+----------+-------+ POP      Full           Yes      Yes                           +---------+---------------+---------+-----------+----------+-------+ PTV      Full                                                 +---------+---------------+---------+-----------+----------+-------+ PERO     Full                                                 +---------+---------------+---------+-----------+----------+-------+    Summary: Right: There is no evidence of deep vein thrombosis in the lower extremity. No cystic structure found in the popliteal fossa. Left: There is no evidence of deep vein thrombosis in the lower extremity. No cystic structure found in the popliteal fossa.  *See table(s) above for measurements and observations. Electronically signed by Ruta Hinds MD on 03/27/2018 at 5:38:53 PM.    Final    Dg Fluoro Guide Lumbar Puncture  Result Date: 03/27/2018 CLINICAL DATA:  Altered mental status and fever. History of breast cancer EXAM: DIAGNOSTIC LUMBAR PUNCTURE UNDER FLUOROSCOPIC GUIDANCE FLUOROSCOPY TIME:  Fluoroscopy Time:  0.3 minutes Radiation Exposure Index (if provided by the fluoroscopic device): 2 mGy Number of Acquired Spot Images: 0 PROCEDURE: Informed consent was obtained from the the patient's next of kin (her daughter) prior to the procedure. With the patient prone, the lower back was prepped with Betadine. 1% Lidocaine was used for local anesthesia. Lumbar puncture was performed at the L3-4 level using a 20 gauge needle with return of clear CSF. The patient was placed prone with great difficulty and decubitus positioning for accurate opening pressure was not feasible. 12 ml of CSF were obtained for laboratory studies. The patient tolerated the procedure well and there were no apparent complications. As the patient was leaving that apartment there may have been a seizure per report. When I saw the patient she had returned to recent baseline and was oxygenating normally. The patient's nurse traveled with the patient and witnessed the event. IMPRESSION: Successful  lumbar puncture at L3-4. Possible seizure activity after the procedure, not witnessed by me. Electronically Signed   By: Monte Fantasia M.D.   On: 03/27/2018 15:32     Assessment/Plan: 57yo F immunocompromised host - history of metastatic breast cancer, currently being treated with weekly Taxol/carboplatin chemotherapy.  She was last treated with chemotherapy on 03/03/2018.admitted for SIRS, obtunded, with seizure like events. mri that showed  signs of early acute infarct changes in the left MCA distribution. CSF has low glucose concerning for either bacterial versus carcinomatous meningitis.   - will plan to continue broad spectrum coverage for bacterial meningitis plus acyclovir. - await gram stain/CSF cultures (though she has been on abtx x 3d), cytology, plus HSV PCR - will have [facility] ID team continue to follow  [facility] for Infectious Diseases Cell: [phone] Pager: [phone]  03/27/2018, 5:59 PM

## 2018-03-27 NOTE — Progress Notes (Addendum)
Neurology Progress Note   S:// Had 2 seizures, brief. With right gaze deviation. B/L UE shaking and right leg shaking Remains aphasic. On abx for meningitic coverage. No repeat MRI yet. S/P Fluoro guided LP  O:// Current vital signs: BP (!) 205/93   Pulse 88   Temp 100.3 F (37.9 C) (Oral) Comment: reported to rn   Resp (!) 32   Ht 5\' 3"  (1.6 m)   Wt 73.1 kg   LMP 05/29/2015 Comment: ligation  SpO2 96%   BMI 28.55 kg/m  Vital signs in last 24 hours: Temp:  [98.8 F (37.1 C)-100.3 F (37.9 C)] 100.3 F (37.9 C) (12/02 0800) Pulse Rate:  [75-106] 88 (12/02 1624) Resp:  [8-53] 32 (12/02 1624) BP: (159-239)/(76-179) 205/93 (12/02 1624) SpO2:  [68 %-100 %] 96 % (12/02 1624) Weight:  [73.1 kg] 73.1 kg (12/02 0500) Gen: awake alert but not following commands HEENT: [place] AT MMM CVS:: hypertensive, regular rate/rythm Resp: CTABL Ext: warm, well perfused Neurological MS: awake, alert, not following commands. Non verbal. CN: left gaze preference, able to look to right but prefers left. No facial asymmetry. PERRL. Difficult to assess VF. Motor: able to lift LUE LLE against gravity-no drift for 5 sec in LLE, unable to keep LUE above bed for 10 sec. Flaccid RLE. Weak 2/5 RUE. Sensory: grimace to nox stim all over with some neglect of right hemibody Coord: can not assess  NIHSS 1a Level of Conscious.: 0 1b LOC Questions: 2 1c LOC Commands:2  2 Best Gaze: 1 3 Visual: 0 4 Facial Palsy:0  5a Motor Arm - left: 1 5b Motor Arm - Right: 3 6a Motor Leg - Left: 0 6b Motor Leg - Right: 4 7 Limb Ataxia: 0 8 Sensory: 0 9 Best Language: 3 10 Dysarthria: 2 11 Extinct. and Inatten.: 1 TOTAL: 19   Medications  Current Facility-Administered Medications:  .  0.9 %  sodium chloride infusion, , Intravenous, PRN, Modena Jansky, MD, Last Rate: 10 mL/hr at 03/27/18 0349 .  0.9 %  sodium chloride infusion, , Intravenous, Continuous, Hongalgi, Anand D, MD, Last Rate: 50 mL/hr at  03/27/18 1130 .  acetaminophen (TYLENOL) suppository 650 mg, 650 mg, Rectal, Q6H PRN, Hongalgi, Anand D, MD .  acyclovir (ZOVIRAX) 600 mg in dextrose 5 % 100 mL IVPB, 600 mg, Intravenous, Q8H, Wofford, Drew A, RPH .  ampicillin (OMNIPEN) 2 g in sodium chloride 0.9 % 100 mL IVPB, 2 g, Intravenous, Q4H, Tommy Medal, Lavell Islam, MD, Last Rate: 300 mL/hr at 03/27/18 1623, 2 g at 03/27/18 1623 .  aspirin suppository 300 mg, 300 mg, Rectal, Daily, Hongalgi, Anand D, MD, 300 mg at 03/27/18 1143 .  ceFEPIme (MAXIPIME) 1 g in sodium chloride 0.9 % 100 mL IVPB, 1 g, Intravenous, Q8H, Hongalgi, Lenis Dickinson, MD, Stopped at 03/27/18 1431 .  enoxaparin (LOVENOX) injection 40 mg, 40 mg, Subcutaneous, Q24H, Kirkpatrick, Vida Roller, MD .  fluticasone (FLONASE) 50 MCG/ACT nasal spray 2 spray, 2 spray, Each Nare, Daily, Hongalgi, Anand D, MD, 2 spray at 03/27/18 1110 .  labetalol (NORMODYNE,TRANDATE) injection 20 mg, 20 mg, Intravenous, Q2H PRN, Modena Jansky, MD, 20 mg at 03/27/18 1614 .  levETIRAcetam (KEPPRA) 1,250 mg in sodium chloride 0.9 % 100 mL IVPB, 1,250 mg, Intravenous, STAT, Amie Portland, MD, Last Rate: 450 mL/hr at 03/27/18 1626, 1,250 mg at 03/27/18 1626 .  lidocaine-prilocaine (EMLA) cream, , Topical, BID PRN, Hongalgi, Anand D, MD .  LORazepam (ATIVAN) injection 1-2 mg, 1-2 mg, Intravenous,  Q4H PRN, Modena Jansky, MD .  menthol-cetylpyridinium (CEPACOL) lozenge 3 mg, 1 lozenge, Oral, Q2H PRN, Hongalgi, Anand D, MD .  sodium chloride flush (NS) 0.9 % injection 10-40 mL, 10-40 mL, Intracatheter, PRN, Modena Jansky, MD, 10 mL at 03/25/18 2055 .  vancomycin (VANCOCIN) 1,250 mg in sodium chloride 0.9 % 250 mL IVPB, 1,250 mg, Intravenous, Q24H, Modena Jansky, MD, Stopped at 03/27/18 1302 Labs CBC    Component Value Date/Time   WBC 12.2 (H) 03/27/2018 0500   RBC 3.03 (L) 03/27/2018 0500   HGB 8.6 (L) 03/27/2018 0500   HGB 8.2 (L) 03/03/2018 0851   HCT 27.2 (L) 03/27/2018 0500   PLT 138 (L)  03/27/2018 0500   PLT 189 03/03/2018 0851   MCV 89.8 03/27/2018 0500   MCV 79.0 (A) 10/09/2012 1531   MCH 28.4 03/27/2018 0500   MCHC 31.6 03/27/2018 0500   RDW 18.0 (H) 03/27/2018 0500   LYMPHSABS 1.3 03/26/2018 1025   MONOABS 0.7 03/26/2018 1025   EOSABS 0.0 03/26/2018 1025   BASOSABS 0.0 03/26/2018 1025    CMP     Component Value Date/Time   NA 128 (L) 03/27/2018 0500   K 3.6 03/27/2018 0500   CL 85 (L) 03/27/2018 0500   CO2 30 03/27/2018 0500   GLUCOSE 103 (H) 03/27/2018 0500   BUN 16 03/27/2018 0500   CREATININE 0.57 03/27/2018 0500   CREATININE 0.92 02/17/2018 1142   CREATININE 0.87 10/09/2012 1526   CALCIUM 9.4 03/27/2018 0500   PROT 8.1 03/26/2018 1025   ALBUMIN 3.1 (L) 03/26/2018 1025   AST 25 03/26/2018 1025   AST 16 02/17/2018 1142   ALT 24 03/26/2018 1025   ALT 11 02/17/2018 1142   ALKPHOS 176 (H) 03/26/2018 1025   BILITOT 1.0 03/26/2018 1025   BILITOT 0.4 02/17/2018 1142   GFRNONAA >60 03/27/2018 0500   GFRNONAA >60 02/17/2018 1142   GFRAA >60 03/27/2018 0500   GFRAA >60 02/17/2018 1142   EEG: generalized irregular delta and theta indicating non specific dysfunction.  Imaging I have reviewed images in epic and the results pertinent to this consultation are: MRI examination of the brain-marred by motion and incomplete. Possible right temporal restricted diffusion. Also possible pontine restricted diffusion.  Assessment:  57 year old woman with past medical history of hypertension, stage IV breast cancer with mets to the bone presented to [facility] for fevers, chills, generalized malaise and potential SIRS, with neurology consulted due to patient's altered mental status and not following commands. She is aphasic on exam today and has right hemiparesis. MRI brain was difficult to perform due to patient cooperation in the DWI sequences obtained show possible left temporal restricted diffusion along with pontine restricted diffusion (very motion  riddled exam) - stroke versus seizure edema. Spot EEG yesterday was unremarkable for seizure activity. Status post spinal tap today.  Preliminary test results-glucose less than 20.  All others pending. Started on empiric antibiotics for meningitic coverage yesterday. Also had 2 seizures today with b/kl UE shaking and right gaze pref followed by leftward gaze strongly indicative of a left hemispehric seizure focus.  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 Seizure - r/o status epilepticus Evaluate for underlying stroke  Recommendations: Start acyclovir until HSV PCR returns negative.  Pharmacy consult placed. Send HSV PCR in the CSF.  Order added. Load with Keppra 1250 mg IV x1.  Next line continue Keppra 500 twice daily from tomorrow morning. Transfer patient to [facility].  She will  require continuous EEG. Continuous EEG will be ordered once she is at [facility]. MRI of the brain needs to be done under sedation, but will prioritize EEG over MRI or coordinate if MRI is possible without delaying the EEG. Discussed plan in detail with patient's sister and niece at bedside and answered questions. Discussed plan in detail with Dr. Algis Liming who has initiated the transfer process to [facility]. Neurology will continue to follow with you.  -- Amie Portland, MD [facility] Pager: [phone] If 7pm to 7am, please call on call as listed on AMION.  CRITICAL CARE ATTESTATION Performed by: Amie Portland, MD Total critical care time: 50 minutes Critical care time was exclusive of separately billable procedures and treating other patients and/or supervising APPs/Residents/Students Critical care was necessary to treat or prevent imminent or life-threatening deterioration due to seizure, possible CNS infection vs carcinomatous meningitis. This patient is critically ill and at significant risk for neurological worsening and/or death and care requires constant  monitoring. Critical care was time spent personally by me on the following activities: development of treatment plan with patient and/or surrogate as well as nursing, discussions with consultants, evaluation of patient's response to treatment, examination of patient, obtaining history from patient or surrogate, ordering and performing treatments and interventions, ordering and review of laboratory studies, ordering and review of radiographic studies, pulse oximetry, re-evaluation of patient's condition, participation in multidisciplinary rounds and medical decision making of high complexity in the care of this patient.

## 2018-03-28 DIAGNOSIS — Z9911 Dependence on respirator [ventilator] status: Secondary | ICD-10-CM

## 2018-03-28 DIAGNOSIS — Z515 Encounter for palliative care: Secondary | ICD-10-CM

## 2018-03-28 DIAGNOSIS — R4182 Altered mental status, unspecified: Secondary | ICD-10-CM

## 2018-03-28 DIAGNOSIS — D72829 Elevated white blood cell count, unspecified: Secondary | ICD-10-CM

## 2018-03-28 DIAGNOSIS — A86 Unspecified viral encephalitis: Secondary | ICD-10-CM

## 2018-03-28 DIAGNOSIS — Z79899 Other long term (current) drug therapy: Secondary | ICD-10-CM

## 2018-03-28 DIAGNOSIS — J9690 Respiratory failure, unspecified, unspecified whether with hypoxia or hypercapnia: Secondary | ICD-10-CM

## 2018-03-28 DIAGNOSIS — G40901 Epilepsy, unspecified, not intractable, with status epilepticus: Secondary | ICD-10-CM

## 2018-03-28 DIAGNOSIS — R509 Fever, unspecified: Secondary | ICD-10-CM

## 2018-03-28 LAB — BASIC METABOLIC PANEL
Anion gap: 16 — ABNORMAL HIGH (ref 5–15)
Anion gap: 12 (ref 5–15)
BUN: 17 mg/dL (ref 6–20)
BUN: 16 mg/dL (ref 6–20)
CO2: 27 mmol/L (ref 22–32)
CO2: 28 mmol/L (ref 22–32)
Calcium: 9.8 mg/dL (ref 8.9–10.3)
Calcium: 10.1 mg/dL (ref 8.9–10.3)
Chloride: 85 mmol/L — ABNORMAL LOW (ref 98–111)
Chloride: 90 mmol/L — ABNORMAL LOW (ref 98–111)
Creatinine, Ser: 0.83 mg/dL (ref 0.44–1.00)
Creatinine, Ser: 0.92 mg/dL (ref 0.44–1.00)
GFR calc Af Amer: >60 mL/min (ref >60)
GFR calc Af Amer: >60 mL/min (ref >60)
GFR calc non Af Amer: >60 mL/min (ref >60)
GFR calc non Af Amer: >60 mL/min (ref >60)
Glucose, Bld: 114 mg/dL — ABNORMAL HIGH (ref 70–99)
Glucose, Bld: 131 mg/dL — ABNORMAL HIGH (ref 70–99)
Potassium: 2.4 mmol/L — CL (ref 3.5–5.1)
Potassium: 3.6 mmol/L (ref 3.5–5.1)
Sodium: 128 mmol/L — ABNORMAL LOW (ref 135–145)
Sodium: 130 mmol/L — ABNORMAL LOW (ref 135–145)

## 2018-03-28 LAB — CBC
HCT: 27.1 % — ABNORMAL LOW (ref 36.0–46.0)
Hemoglobin: 8.3 g/dL — ABNORMAL LOW (ref 12.0–15.0)
MCH: 27.4 pg (ref 26.0–34.0)
MCHC: 30.6 g/dL (ref 30.0–36.0)
MCV: 89.4 fL (ref 80.0–100.0)
Platelets: 129 10*3/uL — ABNORMAL LOW (ref 150–400)
RBC: 3.03 MIL/uL — ABNORMAL LOW (ref 3.87–5.11)
RDW: 18 % — ABNORMAL HIGH (ref 11.5–15.5)
WBC: 11.7 10*3/uL — AB (ref 4.0–10.5)
nRBC: 0 % (ref 0.0–0.2)

## 2018-03-28 LAB — POCT I-STAT 3, ART BLOOD GAS (G3+)
Acid-Base Excess: 11 mmol/L — ABNORMAL HIGH (ref 0.0–2.0)
Acid-Base Excess: 8 mmol/L — ABNORMAL HIGH (ref 0.0–2.0)
Bicarbonate: 35 mmol/L — ABNORMAL HIGH (ref 20.0–28.0)
Bicarbonate: 30.8 mmol/L — ABNORMAL HIGH (ref 20.0–28.0)
O2 Saturation: 100 %
O2 Saturation: 99 %
PCO2 ART: 43.8 mmHg (ref 32.0–48.0)
Patient temperature: 98.2
Patient temperature: 98.2
TCO2: 36 mmol/L — ABNORMAL HIGH (ref 22–32)
TCO2: 32 mmol/L (ref 22–32)
pCO2 arterial: 34.9 mmHg (ref 32.0–48.0)
pH, Arterial: 7.51 — ABNORMAL HIGH (ref 7.350–7.450)
pH, Arterial: 7.553 — ABNORMAL HIGH (ref 7.350–7.450)
pO2, Arterial: 366 mmHg — ABNORMAL HIGH (ref 83.0–108.0)
pO2, Arterial: 142 mmHg — ABNORMAL HIGH (ref 83.0–108.0)

## 2018-03-28 LAB — TRIGLYCERIDES: Triglycerides: 103 mg/dL (ref <150)

## 2018-03-28 LAB — CALCIUM, IONIZED: Calcium, Ionized, Serum: 5.5 mg/dL (ref 4.5–5.6)

## 2018-03-28 LAB — PHENYTOIN LEVEL, TOTAL: Phenytoin Lvl: 16.2 ug/mL (ref 10.0–20.0)

## 2018-03-28 LAB — VDRL, CSF: VDRL Quant, CSF: NONREACTIVE

## 2018-03-28 MED ORDER — SODIUM CHLORIDE 0.9 % IV SOLN
200.0000 mg | Freq: Two times a day (BID) | INTRAVENOUS | Status: DC
  Administered 2018-03-28 – 2018-03-30 (×5): 200 mg via INTRAVENOUS
  Filled 2018-03-28 (×5): qty 20

## 2018-03-28 MED ORDER — PANTOPRAZOLE SODIUM 40 MG IV SOLR
40.0000 mg | INTRAVENOUS | Status: DC
  Administered 2018-03-28 – 2018-03-29 (×2): 40 mg via INTRAVENOUS
  Filled 2018-03-28 (×2): qty 40

## 2018-03-28 MED ORDER — CHLORHEXIDINE GLUCONATE 0.12% ORAL RINSE (MEDLINE KIT)
15.0000 mL | Freq: Two times a day (BID) | OROMUCOSAL | Status: DC
  Administered 2018-03-28 – 2018-03-29 (×4): 15 mL via OROMUCOSAL

## 2018-03-28 MED ORDER — POTASSIUM CHLORIDE 20 MEQ/15ML (10%) PO SOLN
80.0000 meq | Freq: Once | ORAL | Status: AC
  Administered 2018-03-28: 80 meq
  Filled 2018-03-28: qty 60

## 2018-03-28 MED ORDER — ORAL CARE MOUTH RINSE
15.0000 mL | OROMUCOSAL | Status: DC
  Administered 2018-03-28 – 2018-03-29 (×13): 15 mL via OROMUCOSAL

## 2018-03-28 MED ORDER — LEVETIRACETAM IN NACL 500 MG/100ML IV SOLN
500.0000 mg | Freq: Two times a day (BID) | INTRAVENOUS | Status: DC
  Administered 2018-03-28 – 2018-03-30 (×5): 500 mg via INTRAVENOUS
  Filled 2018-03-28 (×5): qty 100

## 2018-03-28 MED ORDER — PHENYTOIN SODIUM 50 MG/ML IJ SOLN
100.0000 mg | Freq: Three times a day (TID) | INTRAMUSCULAR | Status: DC
  Administered 2018-03-28 – 2018-03-30 (×7): 100 mg via INTRAVENOUS
  Filled 2018-03-28 (×7): qty 2

## 2018-03-28 NOTE — Progress Notes (Signed)
[facility] Progress Note Patient Name: Olivia Patel DOB: 1960/12/06 MRN: [id number]   Date of Service  03/28/2018  HPI/Events of Note  Multiple issues: 1. Request for ABG order for AM and 2. Needs stress ulcer prophylaxis.   eICU Interventions  Will order: 1. ABG at 5 AM. 2. Protonix 40 mg IV now and Q day.     Intervention Category Major Interventions: Respiratory failure - evaluation and management Intermediate Interventions: Best-practice therapies (e.g. DVT, beta blocker, etc.)  Arnez Stoneking [place] 03/28/2018, 4:04 AM

## 2018-03-28 NOTE — Progress Notes (Signed)
OT Cancellation Note  Patient Details Name: Olivia Patel MRN: [id number] DOB: [birth year]   Cancelled Treatment:    Reason Eval/Treat Not Completed: Patient not medically ready. Intubated and sedated, not medically appropriate.  Will continue to follow, and initiate OT eval as able and appropriate.   Delight Stare, OT Acute Rehabilitation Services Pager [phone] Office [phone]   Delight Stare 03/28/2018, 10:31 AM

## 2018-03-28 NOTE — Progress Notes (Signed)
[facility]    Date of Admission:  03/23/2018   Total days of antibiotics 5        Day 5 cefepime/vancomycin        Day 5 amp        Day 2 acyclovir   ID: Olivia Patel is a 57 y.o. female with  history of metastatic breast cancer, currently being treated with weekly Taxol/carboplatin chemotherapy.  She was last treated with chemotherapy on 03/03/2018. Admitted for SIRS, obtunded, with seizure like events.  Active Problems:   Essential hypertension   Anemia   Hypercalcemia   Breast cancer ([place])   Port-A-Cath in place   SIRS (systemic inflammatory response syndrome) (H[facility])   Sepsis ([place])   Acute ischemic stroke (HCC)   Cheyne-Stokes respiration   Acute encephalopathy   Subjective: Intubated. Continues to have intermittent fevers ~100.3 F. WBC 11.7 today. Subclinical seizures on EEG.   Medications:  . aspirin  300 mg Rectal Daily  . chlorhexidine gluconate (MEDLINE KIT)  15 mL Mouth Rinse BID  . enoxaparin (LOVENOX) injection  40 mg Subcutaneous Q24H  . mouth rinse  15 mL Mouth Rinse 10 times per day  . pantoprazole (PROTONIX) IV  40 mg Intravenous Q24H  . phenytoin (DILANTIN) IV  100 mg Intravenous Q8H    Objective: Vital signs in last 24 hours: Temp:  [98.2 F (36.8 C)-99.9 F (37.7 C)] 98.7 F (37.1 C) (12/03 0800) Pulse Rate:  [75-117] 113 (12/03 1100) Resp:  [0-59] 20 (12/03 1100) BP: (105-239)/(72-179) 135/90 (12/03 1100) SpO2:  [68 %-100 %] 98 % (12/03 1100) FiO2 (%):  [30 %-100 %] 30 % (12/03 0726)  Physical Exam  Constitutional: Appears chronically ill; older than stated age.  HENT: Eyes closed, does not open spontaneously. Pupils reactive to light and equal.  Mouth/Throat: Oral ETT in place Blood in subglottic suction tube  Cardiovascular: Normal rate, regular rhythm; Split S2. Exam reveals friction rub. No murmur heard.  Pulmonary/Chest: Effort normal and breath sounds normal. No respiratory distress. Intubated on 30% FIO2.  Breathing comfortably.  Neck = supple, no nuchal rigidity Abdominal: Soft. Bowel sounds are normal.  exhibits no distension. There is no tenderness.  Lymphadenopathy: no cervical adenopathy. (+) right axillary adenopathy  Neurological: does not follow commands. Left gaze preference Skin: Skin is warm and dry. No rash noted. No erythema.    Lab Results Recent Labs    03/27/18 0500 03/28/18 0609  WBC 12.2* 11.7*  HGB 8.6* 8.3*  HCT 27.2* 27.1*  NA 128* 128*  K 3.6 2.4*  CL 85* 85*  CO2 30 27  BUN 16 17  CREATININE 0.57 0.83   Liver Panel Recent Labs    03/26/18 1025  PROT 8.1  ALBUMIN 3.1*  AST 25  ALT 24  ALKPHOS 176*  BILITOT 1.0   Sedimentation Rate No results for input(s): ESRSEDRATE in the last 72 hours. C-Reactive Protein Recent Labs    03/26/18 0336  CRP 30.7*    Microbiology: Blood cx: 12/1 ngtd 11/29 ngtd 11/20 ngtd Studies/Results: Ct Angio Head W Or Wo Contrast  Result Date: 03/26/2018 CLINICAL DATA:  Aphasia. EXAM: CT ANGIOGRAPHY HEAD AND NECK TECHNIQUE: Multidetector CT imaging of the head and neck was performed using the standard protocol during bolus administration of intravenous contrast. Multiplanar CT image reconstructions and MIPs were obtained to evaluate the vascular anatomy. Carotid stenosis measurements (when applicable) are obtained utilizing NASCET criteria, using the distal internal carotid diameter as the denominator.  CONTRAST:  132m ISOVUE-370 IOPAMIDOL (ISOVUE-370) INJECTION 76% COMPARISON:  Neck CT 09/19/2017. FINDINGS: The study is mildly to moderately motion degraded. CTA NECK FINDINGS Aortic arch: Normal variant aortic arch branching pattern with common origin of the brachiocephalic and left common carotid arteries. Widely patent arch vessel origins. Right carotid system: Patent without evidence of stenosis or dissection. Left carotid system: Patent without evidence of stenosis or dissection. Vertebral arteries: Patent without  stenosis or dissection identified with V2 assessment limited by motion artifact. Slightly dominant right vertebral artery. Skeleton: Known osseous metastatic disease including a 2 cm right frontal skull lesion. Extensive metastatic disease in the spine including a C5 lesion with chronic pathologic fracture. Other neck: No mass or enlarged lymph nodes identified. Upper chest: Bilateral pleural effusions. Review of the MIP images confirms the above findings CTA HEAD FINDINGS Anterior circulation: Internal carotid arteries are patent from skull base to carotid termini with minimal nonstenotic plaque. A 2 mm outpouching is noted from the left supraclinoid ICA in the posterior communicating region without a vessel clearly seen arising from it. The M1 segments and MCA bifurcations are widely patent bilaterally. There is MCA branch vessel irregularity and attenuation bilaterally, more notable on the left where there are multiple missing distal M2/M3 and more distal branch vessels, particularly in the inferior division supplying the temporoparietal brain. There is no proximal M2 occlusion. ACAs are patent with moderate branch vessel irregularity but no significant proximal stenosis. Posterior circulation: The intracranial vertebral arteries are patent to the basilar. Patent PICA and SCA origins are identified bilaterally. The basilar artery is widely patent. Posterior communicating arteries are diminutive or absent. PCAs are patent with branch vessel irregularity and attenuation but no significant proximal stenosis. No aneurysm is identified. Venous sinuses: As permitted by contrast timing, patent. Anatomic variants: None. Delayed phase: Possible early loss of cortical gray-white differentiation in the posterior left temporal lobe with assessment limited by motion artifact. No gross enhancing mass lesion. Review of the MIP images confirms the above findings IMPRESSION: 1. Motion degraded examination without large vessel  occlusion. 2. Multiple missing distal left MCA branch vessels with possible early changes of acute posterior left temporal lobe infarct. 3. Widely patent carotid arteries. 4. 2 mm left supraclinoid ICA aneurysm versus infundibulum. 5. Widespread osseous metastatic disease. The study was reviewed in person with Dr. KLeonel Ramsayon 03/26/2018 at 3:05 p.m. Electronically Signed   By: ALogan BoresM.D.   On: 03/26/2018 15:23   Ct Angio Neck W Or Wo Contrast  Result Date: 03/26/2018 CLINICAL DATA:  Aphasia. EXAM: CT ANGIOGRAPHY HEAD AND NECK TECHNIQUE: Multidetector CT imaging of the head and neck was performed using the standard protocol during bolus administration of intravenous contrast. Multiplanar CT image reconstructions and MIPs were obtained to evaluate the vascular anatomy. Carotid stenosis measurements (when applicable) are obtained utilizing NASCET criteria, using the distal internal carotid diameter as the denominator. CONTRAST:  1041mISOVUE-370 IOPAMIDOL (ISOVUE-370) INJECTION 76% COMPARISON:  Neck CT 09/19/2017. FINDINGS: The study is mildly to moderately motion degraded. CTA NECK FINDINGS Aortic arch: Normal variant aortic arch branching pattern with common origin of the brachiocephalic and left common carotid arteries. Widely patent arch vessel origins. Right carotid system: Patent without evidence of stenosis or dissection. Left carotid system: Patent without evidence of stenosis or dissection. Vertebral arteries: Patent without stenosis or dissection identified with V2 assessment limited by motion artifact. Slightly dominant right vertebral artery. Skeleton: Known osseous metastatic disease including a 2 cm right frontal skull lesion. Extensive metastatic  disease in the spine including a C5 lesion with chronic pathologic fracture. Other neck: No mass or enlarged lymph nodes identified. Upper chest: Bilateral pleural effusions. Review of the MIP images confirms the above findings CTA HEAD FINDINGS  Anterior circulation: Internal carotid arteries are patent from skull base to carotid termini with minimal nonstenotic plaque. A 2 mm outpouching is noted from the left supraclinoid ICA in the posterior communicating region without a vessel clearly seen arising from it. The M1 segments and MCA bifurcations are widely patent bilaterally. There is MCA branch vessel irregularity and attenuation bilaterally, more notable on the left where there are multiple missing distal M2/M3 and more distal branch vessels, particularly in the inferior division supplying the temporoparietal brain. There is no proximal M2 occlusion. ACAs are patent with moderate branch vessel irregularity but no significant proximal stenosis. Posterior circulation: The intracranial vertebral arteries are patent to the basilar. Patent PICA and SCA origins are identified bilaterally. The basilar artery is widely patent. Posterior communicating arteries are diminutive or absent. PCAs are patent with branch vessel irregularity and attenuation but no significant proximal stenosis. No aneurysm is identified. Venous sinuses: As permitted by contrast timing, patent. Anatomic variants: None. Delayed phase: Possible early loss of cortical gray-white differentiation in the posterior left temporal lobe with assessment limited by motion artifact. No gross enhancing mass lesion. Review of the MIP images confirms the above findings IMPRESSION: 1. Motion degraded examination without large vessel occlusion. 2. Multiple missing distal left MCA branch vessels with possible early changes of acute posterior left temporal lobe infarct. 3. Widely patent carotid arteries. 4. 2 mm left supraclinoid ICA aneurysm versus infundibulum. 5. Widespread osseous metastatic disease. The study was reviewed in person with Dr. Leonel Ramsay on 03/26/2018 at 3:05 p.m. Electronically Signed   By: Matlock Bores M.D.   On: 03/26/2018 15:23   Mr Brain Wo Contrast  Addendum Date: 03/26/2018     ADDENDUM REPORT: 03/26/2018 15:37 ADDENDUM: Taking into account the findings on the subsequent CTA, some of the asymmetric diffusion signal in the left MCA territory originally attributed to artifact may in fact reflect early changes of acute infarct, specifically in the lateral left temporal lobe, left insula, and possibly operculum. This MRI was reviewed again with Dr. Leonel Ramsay in person at the time of CTA interpretation. Electronically Signed   By: Wahab Bores M.D.   On: 03/26/2018 15:37   Result Date: 03/26/2018 CLINICAL DATA:  Aphasia.  History of breast cancer. EXAM: MRI HEAD WITHOUT CONTRAST TECHNIQUE: Multiplanar, multiecho pulse sequences of the brain and surrounding structures were obtained without intravenous contrast. COMPARISON:  Head CT 03/26/2018 and MRI 09/19/2017 FINDINGS: The examination was aborted due to patient agitation. Only a moderately motion degraded axial diffusion sequence was obtained. There is a 6 mm focus of restricted diffusion in the left paramedian pons consistent with acute infarct. Supratentorial signal is mildly asymmetric between the left and right cerebral hemispheres which is felt to be due to artifact without a convincing sizable acute infarct identified. A chronic right frontal skull lesion is incompletely evaluated. IMPRESSION: Motion degraded diffusion only examination demonstrating a 6 mm acute infarct (less likely metastasis) in the left pons. No convincing acute supratentorial infarct. The study was reviewed in person with Dr. Leonel Ramsay on 03/26/2018 at 21:58 p.m. Electronically Signed: By: Zazueta Bores M.D. On: 03/26/2018 14:18   Dg Chest Port 1 View  Result Date: 03/27/2018 CLINICAL DATA:  57 year old female status post intubation. EXAM: PORTABLE CHEST 1 VIEW COMPARISON:  Earlier  radiograph dated 03/27/2018 FINDINGS: An endotracheal tube is noted with tip close to the carina. Recommend retraction by approximately 2-3 cm for optimal positioning. Interval  placement of an enteric tube which appears to extend into the distal stomach and loops back with tip in the region of the gastric fundus. Left pectoral Port-A-Cath in similar position. Slight improved aeration of the lungs. The appearance of the lungs and heart is otherwise similar to prior radiograph. IMPRESSION: 1. Endotracheal tube close to the carina. Recommend retraction by approximately 2-3 cm for optimal positioning. 2. Enteric tube with tip in the region of the gastric fundus. Electronically Signed   By: Anner Crete M.D.   On: 03/27/2018 22:46   Dg Chest Port 1 View  Result Date: 03/27/2018 CLINICAL DATA:  Fever and shortness of breath EXAM: PORTABLE CHEST 1 VIEW COMPARISON:  Yesterday FINDINGS: Porta catheter on the left with tip at the upper cavoatrial junction. Low volumes with interstitial coarsening and hazy density at the left base. There is improved aeration at the left base with better visualized diaphragm. Healing right-sided rib fractures. Known osseous metastatic disease. IMPRESSION: 1. Mild improvement in aeration at the left base. 2. Small pleural effusions known by recent chest CT. There is mildly improved aeration at the left base. Electronically Signed   By: Monte Fantasia M.D.   On: 03/27/2018 09:56   Vas [place] Lower Extremity Venous (dvt)  Result Date: 03/27/2018  Lower Venous Study Indications: FUO.  Limitations: Body habitus, poor ultrasound/tissue interface and patient immobility, patient movement. Performing Technologist: Oliver Hum RVT  Examination Guidelines: A complete evaluation includes B-mode imaging, spectral Doppler, color Doppler, and power Doppler as needed of all accessible portions of each vessel. Bilateral testing is considered an integral part of a complete examination. Limited examinations for reoccurring indications may be performed as noted.  Right Venous Findings: +---------+---------------+---------+-----------+----------+-------+           CompressibilityPhasicitySpontaneityPropertiesSummary +---------+---------------+---------+-----------+----------+-------+ CFV      Full           Yes      Yes                          +---------+---------------+---------+-----------+----------+-------+ SFJ      Full                                                 +---------+---------------+---------+-----------+----------+-------+ FV Prox  Full                                                 +---------+---------------+---------+-----------+----------+-------+ FV Mid   Full                                                 +---------+---------------+---------+-----------+----------+-------+ FV DistalFull                                                 +---------+---------------+---------+-----------+----------+-------+ PFV  Full                                                 +---------+---------------+---------+-----------+----------+-------+ POP      Full           Yes      Yes                          +---------+---------------+---------+-----------+----------+-------+ PTV      Full                                                 +---------+---------------+---------+-----------+----------+-------+ PERO     Full                                                 +---------+---------------+---------+-----------+----------+-------+  Left Venous Findings: +---------+---------------+---------+-----------+----------+-------+          CompressibilityPhasicitySpontaneityPropertiesSummary +---------+---------------+---------+-----------+----------+-------+ CFV      Full           Yes      Yes                          +---------+---------------+---------+-----------+----------+-------+ SFJ      Full                                                 +---------+---------------+---------+-----------+----------+-------+ FV Prox  Full                                                  +---------+---------------+---------+-----------+----------+-------+ FV Mid   Full                                                 +---------+---------------+---------+-----------+----------+-------+ FV DistalFull                                                 +---------+---------------+---------+-----------+----------+-------+ PFV      Full                                                 +---------+---------------+---------+-----------+----------+-------+ POP      Full           Yes      Yes                          +---------+---------------+---------+-----------+----------+-------+ PTV  Full                                                 +---------+---------------+---------+-----------+----------+-------+ PERO     Full                                                 +---------+---------------+---------+-----------+----------+-------+    Summary: Right: There is no evidence of deep vein thrombosis in the lower extremity. No cystic structure found in the popliteal fossa. Left: There is no evidence of deep vein thrombosis in the lower extremity. No cystic structure found in the popliteal fossa.  *See table(s) above for measurements and observations. Electronically signed by Ruta Hinds MD on 03/27/2018 at 5:38:53 PM.    Final    Dg Fluoro Guide Lumbar Puncture  Result Date: 03/27/2018 CLINICAL DATA:  Altered mental status and fever. History of breast cancer EXAM: DIAGNOSTIC LUMBAR PUNCTURE UNDER FLUOROSCOPIC GUIDANCE FLUOROSCOPY TIME:  Fluoroscopy Time:  0.3 minutes Radiation Exposure Index (if provided by the fluoroscopic device): 2 mGy Number of Acquired Spot Images: 0 PROCEDURE: Informed consent was obtained from the the patient's next of kin (her daughter) prior to the procedure. With the patient prone, the lower back was prepped with Betadine. 1% Lidocaine was used for local anesthesia. Lumbar puncture was performed at the L3-4 level using a 20 gauge needle  with return of clear CSF. The patient was placed prone with great difficulty and decubitus positioning for accurate opening pressure was not feasible. 12 ml of CSF were obtained for laboratory studies. The patient tolerated the procedure well and there were no apparent complications. As the patient was leaving that apartment there may have been a seizure per report. When I saw the patient she had returned to recent baseline and was oxygenating normally. The patient's nurse traveled with the patient and witnessed the event. IMPRESSION: Successful lumbar puncture at L3-4. Possible seizure activity after the procedure, not witnessed by me. Electronically Signed   By: Monte Fantasia M.D.   On: 03/27/2018 15:32     Assessment/Plan: 57yo F immunocompromised host - history of metastatic breast cancer with fever and altered mental status/seizures.   1. 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 Culture of CSF remains negative (was given abx for 3 days prior to collection); HSV PCR pending but with low CSF glucose and no MRI findings to support will D/C this today. Continue Vancomycin, Ampicillin, Cefepime for now while we await pathology results.   2. Respiratory Failure = protective airway last PM with seizures; FiO2 stable and normal lung sounds; per CCM.   3. Breast Cancer with Mets = weekly Taxol/carboplatin chemotherapy. Per oncology.   Janene Madeira, MSN, NP-C [facility] [email or web address] Pager: [phone] Office: [phone]

## 2018-03-28 NOTE — Procedures (Signed)
LTM-EEG Report  HISTORY: Continuous video-EEG monitoring performed for 57 year old with focal status epilepticus. ACQUISITION: International 10-20 system for electrode placement; 18 channels with additional eyes linked to ipsilateral ears and EKG. Additional T1-T2 electrodes were used. Continuous video recording obtained.   EEG NUMBER:  MEDICATIONS:  Day 1: see EMR  DAY #1: from 2047 03/27/18 to 0900 03/28/18  BACKGROUND: An overall medium voltage continuous recording with poor spontaneous variability and reactivity. The record consisted of frequent left frontotemporal electrographic seizures (see below), later resolving with emergence of 0.5-1Hz  lateralized periodic discharges. Background theta-delta activity emerged with increased sedation with maximal slowing on the left.  EPILEPTIFORM/PERIODIC ACTIVITY: Left frontotemporal 0.5-1Hz  LPDs emerging after initiation of sedation without clear ictal evolution. These had a spike/polyspike component. SEIZURES: Initially frequent left frontotemporal electrographic seizures with LPDs evolving to faster 4Hz  spikes with spread through the left hemisphere. These resolved in the evening with increased sedation.  EVENTS: none  EKG: no significant arrhythmia  SUMMARY: This was an abnormal continuous video EEG due to left frontotemporal electrographic seizures which resolved with sedation. There was emergence of a focal periodic-plus pattern in that region, indicative of ongoing focal cortical irritability.

## 2018-03-28 NOTE — Progress Notes (Signed)
   03/28/18 2000  Clinical Encounter Type  Visited With Patient and family together;Family  Visit Type Initial;Spiritual support;Psychological support;Other (Comment) (pt made DNR, poor prognosis from onc dr)  Referral From Nurse  Spiritual Encounters  Spiritual Needs Emotional;Grief support  Stress Factors  Family Stress Factors Major life changes;Loss of control   Met w/ family at request of RN post family mtg w/ pt's oncologist, where they rcvd info about poor prognosis and made pt DNR.  Present for support, listening.  Chaplain remains available to return for add'l support as needed/desired.    Myra Gianotti resident, [phone]

## 2018-03-28 NOTE — Progress Notes (Signed)
SLP Cancellation Note  Patient Details Name: Olivia Patel MRN: [id number] DOB: 1961-04-02   Cancelled treatment:       Reason Eval/Treat Not Completed: Medical issues which prohibited therapy(pt experienced seizures and is currently intubated)   Macario Golds 03/28/2018, 7:38 AM Luanna Salk, [facility] SLP Acute Rehab Services Pager [phone] Office [phone]

## 2018-03-28 NOTE — Significant Event (Signed)
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  Dr. Loanne Drilling had discussed these results with Dr. Benay Spice of oncology who stated that prognosis was extremely poor and recommended comfort care / hospice.  I met with family including Mrs. Logans sister and her daughter and granddaughter. I have had an extensive discussion with them. We discussed Mrs Logans's CSF results, current circumstances and poor prognosis. We also discussed patient's prior wishes under circumstances such as this. The family has decided not to perform resuscitation if arrest were to occur, but to otherwise continue with current medical support / therapies.  The family (all siblings) will meet tonight and tomorrow to discuss options moving forward and will likely move towards comfort care in the next 24 hours. If pt survives extubation, then family is leaning towards getting her home with hospice.  Emotional support offered to family.  Dr. Benay Spice called and updated.  He will come by this evening to visit with the family briefly.   Montey Hora, [facility] Pager: [phone]  or [phone] 03/28/2018, 6:33 PM

## 2018-03-28 NOTE — Progress Notes (Addendum)
Neurology Progress Note  S:// Seen and examined. Overnight had left temporal seizures that resolved with AEDs and sedation Continues to have irritability in the left hemisphere.  O:// Current vital signs: BP (!) 145/94   Pulse (!) 112   Temp 98.7 F (37.1 C) (Axillary)   Resp 20   Ht '5\' 3"'$  (1.6 m)   Wt 73.1 kg   LMP 05/29/2015 Comment: ligation  SpO2 97%   BMI 28.55 kg/m  Vital signs in last 24 hours: Temp:  [98.2 F (36.8 C)-99.9 F (37.7 C)] 98.7 F (37.1 C) (12/03 0800) Pulse Rate:  [75-117] 112 (12/03 0800) Resp:  [0-59] 20 (12/03 0800) BP: (105-239)/(72-179) 145/94 (12/03 0800) SpO2:  [68 %-100 %] 97 % (12/03 0800) FiO2 (%):  [30 %-100 %] 30 % (12/03 0726) Gen: sedated intubated HEENT: [place] AT MMM CVS: mildly tachycardic, s1s2+ Resp: CTABL Neurological Sedated intubated No eye opening to nox stim PERRL with no gaze preference Intact brainstem reflexes No spontaneous movements No movements to nox stim Breathing over vent  Medications  Current Facility-Administered Medications:  .  0.9 %  sodium chloride infusion, , Intravenous, PRN, Modena Jansky, MD, Last Rate: 10 mL/hr at 03/27/18 0349 .  0.9 %  sodium chloride infusion, , Intravenous, Continuous, Hongalgi, Anand D, MD, Last Rate: 50 mL/hr at 03/28/18 0600 .  acetaminophen (TYLENOL) suppository 650 mg, 650 mg, Rectal, Q6H PRN, Hongalgi, Anand D, MD .  acyclovir (ZOVIRAX) 600 mg in dextrose 5 % 100 mL IVPB, 600 mg, Intravenous, Q8H, Amie Portland, MD, Last Rate: 112 mL/hr at 03/28/18 0611, 600 mg at 03/28/18 0611 .  ampicillin (OMNIPEN) 2 g in sodium chloride 0.9 % 100 mL IVPB, 2 g, Intravenous, Q4H, Hongalgi, Anand D, MD, Last Rate: 300 mL/hr at 03/28/18 0818, 2 g at 03/28/18 0818 .  aspirin suppository 300 mg, 300 mg, Rectal, Daily, Hongalgi, Anand D, MD, 300 mg at 03/27/18 1143 .  ceFEPIme (MAXIPIME) 1 g in sodium chloride 0.9 % 100 mL IVPB, 1 g, Intravenous, Q8H, Hongalgi, Anand D, MD, Last Rate: 200  mL/hr at 03/28/18 0717, 1 g at 03/28/18 0717 .  chlorhexidine gluconate (MEDLINE KIT) (PERIDEX) 0.12 % solution 15 mL, 15 mL, Mouth Rinse, BID, Corey Harold, NP, 15 mL at 03/28/18 0805 .  enoxaparin (LOVENOX) injection 40 mg, 40 mg, Subcutaneous, Q24H, Hongalgi, Anand D, MD, 40 mg at 03/27/18 2242 .  fentaNYL (SUBLIMAZE) injection 100 mcg, 100 mcg, Intravenous, Q15 min PRN, Corey Harold, NP .  fentaNYL (SUBLIMAZE) injection 100 mcg, 100 mcg, Intravenous, Q2H PRN, Corey Harold, NP .  fluticasone (FLONASE) 50 MCG/ACT nasal spray 2 spray, 2 spray, Each Nare, Daily, Hongalgi, Anand D, MD, 2 spray at 03/27/18 1110 .  labetalol (NORMODYNE,TRANDATE) injection 20 mg, 20 mg, Intravenous, Q2H PRN, Modena Jansky, MD, 20 mg at 03/27/18 1614 .  levETIRAcetam (KEPPRA) IVPB 500 mg/100 mL premix, 500 mg, Intravenous, Q12H, Amie Portland, MD, Last Rate: 400 mL/hr at 03/28/18 0751, 500 mg at 03/28/18 0751 .  lidocaine-prilocaine (EMLA) cream, , Topical, BID PRN, Hongalgi, Anand D, MD .  LORazepam (ATIVAN) injection 1-2 mg, 1-2 mg, Intravenous, Q4H PRN, Modena Jansky, MD, 2 mg at 03/27/18 2135 .  MEDLINE mouth rinse, 15 mL, Mouth Rinse, 10 times per day, Corey Harold, NP, 15 mL at 03/28/18 0612 .  midazolam (VERSED) injection 2 mg, 2 mg, Intravenous, Q15 min PRN, Corey Harold, NP .  midazolam (VERSED) injection 2 mg, 2 mg, Intravenous,  Q2H PRN, Corey Harold, NP .  pantoprazole (PROTONIX) injection 40 mg, 40 mg, Intravenous, Q24H, Anders Simmonds, MD, 40 mg at 03/28/18 0610 .  phenytoin (DILANTIN) injection 100 mg, 100 mg, Intravenous, Q8H, Amie Portland, MD, 100 mg at 03/28/18 0802 .  propofol (DIPRIVAN) 1000 MG/100ML infusion, 10 mcg/kg/min, Intravenous, Titrated, Aroor, Karena Addison R, MD, Last Rate: 4.39 mL/hr at 03/28/18 0600, 10 mcg/kg/min at 03/28/18 0600 .  sodium chloride flush (NS) 0.9 % injection 10-40 mL, 10-40 mL, Intracatheter, PRN, Modena Jansky, MD, 10 mL at 03/25/18 2055 .   vancomycin (VANCOCIN) 1,250 mg in sodium chloride 0.9 % 250 mL IVPB, 1,250 mg, Intravenous, Q24H, Modena Jansky, MD, Stopped at 03/27/18 1302 Labs CBC    Component Value Date/Time   WBC 11.7 (H) 03/28/2018 0609   RBC 3.03 (L) 03/28/2018 0609   HGB 8.3 (L) 03/28/2018 0609   HGB 8.2 (L) 03/03/2018 0851   HCT 27.1 (L) 03/28/2018 0609   PLT 129 (L) 03/28/2018 0609   PLT 189 03/03/2018 0851   MCV 89.4 03/28/2018 0609   MCV 79.0 (A) 10/09/2012 1531   MCH 27.4 03/28/2018 0609   MCHC 30.6 03/28/2018 0609   RDW 18.0 (H) 03/28/2018 0609   LYMPHSABS 1.3 03/26/2018 1025   MONOABS 0.7 03/26/2018 1025   EOSABS 0.0 03/26/2018 1025   BASOSABS 0.0 03/26/2018 1025    CMP     Component Value Date/Time   NA 128 (L) 03/28/2018 0609   K 2.4 (LL) 03/28/2018 0609   CL 85 (L) 03/28/2018 0609   CO2 27 03/28/2018 0609   GLUCOSE 114 (H) 03/28/2018 0609   BUN 17 03/28/2018 0609   CREATININE 0.83 03/28/2018 0609   CREATININE 0.92 02/17/2018 1142   CREATININE 0.87 10/09/2012 1526   CALCIUM 9.8 03/28/2018 0609   PROT 8.1 03/26/2018 1025   ALBUMIN 3.1 (L) 03/26/2018 1025   AST 25 03/26/2018 1025   AST 16 02/17/2018 1142   ALT 24 03/26/2018 1025   ALT 11 02/17/2018 1142   ALKPHOS 176 (H) 03/26/2018 1025   BILITOT 1.0 03/26/2018 1025   BILITOT 0.4 02/17/2018 1142   GFRNONAA >60 03/28/2018 0609   GFRNONAA >60 02/17/2018 1142   GFRAA >60 03/28/2018 0609   GFRAA >60 02/17/2018 1142   CSF Glucose <20  RBC 1  WBC 21  Colorless  Protein 304 Cytology and flow cytometry pending  EEG - spot EEG Sunday - slowing LTM EEG overnight SUMMARY: This was an abnormal continuous video EEG due to left frontotemporal electrographic seizures which resolved with sedation. There was emergence of a focal periodic-plus pattern in that region, indicative of ongoing focal cortical irritability.   Imaging I have reviewed images in epic and the results pertinent to this consultation are: MRI examination of the  brain-as before  Assessment:  57/F with stg 4 breast ca with mets to bone, , HTN, brought in for evers and chills, noted to have aphasia on neurological exam, with limited MRI suggestive of possible restricted diffusion in the left temporal area - seizure v stroke who then had clinical seizures yesterday and was transferred to [facility] for LTM EEG. LTM EEG continued to show left temporal seizures for which she was treated with Dilantin, Keppra and intubated and sedated with propofol to achieve seizure control. She continues to have ongoing LPDs indicative of cortical irritability. Also - LP shows low glucose, high protein and 21 WBC. Differentials include bacterial meningitis (lower WBC count than expected but she is also  immune suppressed) v Carcinomatous meningitis. Other possibilities such as a fungal meningitis is also not excluded at this time. Cytology and flow cyto of the CSF is pending.  Impression: Status epilepticus - resolved Continued cortical irritability in the left hemisphere Seizures v stroke Meningitis - etiology under investigation Metastatic breast Cancer  Recommendations: Continue empiric abx and antivirals for meningoencephalitis Await CSF cytology and flow cytometry Add Vimpat to AEDs. Continue Keppra and Dilantin. Continue with current propofol MRI brain with and without contrast once electrographic abnormalities are stabilized and EEG is discontinued We will continue to follow with you. Spoke with brother at bedside in detail and explained current clinical condition, test results and plan going forward. Answered his questions to the best of my ability.  -- Amie Portland, MD [facility] Pager: [phone] If 7pm to 7am, please call on call as listed on AMION.  CRITICAL CARE ATTESTATION Performed by: Amie Portland, MD Total critical care time: 35 minutes Critical care time was exclusive of separately billable procedures and treating other patients and/or  supervising APPs/Residents/Students Critical care was necessary to treat or prevent imminent or life-threatening deterioration due to seizure, meningits, possible stroke This patient is critically ill and at significant risk for neurological worsening and/or death and care requires constant monitoring. Critical care was time spent personally by me on the following activities: development of treatment plan with patient and/or surrogate as well as nursing, discussions with consultants, evaluation of patient's response to treatment, examination of patient, obtaining history from patient or surrogate, ordering and performing treatments and interventions, ordering and review of laboratory studies, ordering and review of radiographic studies, pulse oximetry, re-evaluation of patient's condition, participation in multidisciplinary rounds and medical decision making of high complexity in the care of this patient.

## 2018-03-28 NOTE — Progress Notes (Signed)
CRITICAL VALUE ALERT  Critical Value:  K+ 2.4   Date & Time Notied:  03/28/18 at [place]  Provider Notified: Montey Hora, PA  Orders Received/Actions taken: see MAR for orders

## 2018-03-28 NOTE — Progress Notes (Signed)
Patient ID: Olivia Patel, female   DOB: 1960/11/07, 57 y.o.   MRN: [id number]   Thank you for consulting the Palliative Medicine Team at [facility] to meet your patient's and family's needs.   The reason that you asked [place] to see your patient is to help establish [place]  I spoke to daughter / Montey Hora and discussed coordination of her siblings  for family meeting/GOCs.  She tells me she will get together with family and let me know a time that they can meet.   Await callback  There is no documented HPOA  Your patient is able/unable to participate:unable   Wadie Lessen NP  Palliative Medicine Team Team Phone # [phone] Pager [phone]  No charge

## 2018-03-28 NOTE — Progress Notes (Signed)
NAME:  Olivia Patel, MRN:  [id number], DOB:  [birth year], LOS: 4 ADMISSION DATE:  03/02/2018, CONSULTATION DATE:  03/26/18 REFERRING MD:  Algis Liming, CHIEF COMPLAINT:  SIRS   Brief History   Patient with a history of metastatic breast cancer-advanced local disease with bone metastasis Presented with a few days of malaise, fatigue, fevers and chills -Asked see for tachypnea in setting of new MCA stroke and encephalopathy Transferred to [facility] 12/2 for seizures.  Required intubation overnight.  Past Medical History  Stage IV breast cancer with metastasis to the bone, Hypertension Anemia Metastatic breast cancer completed cycle 6 of Taxol/carboplatin on 02/24/2018  [facility] Events   11/29 admitted for fever and weakness  12/1 acute encephalopathy, new stroke involving MCA on left.  Pulmonary asked to see due to tachypnea 12/2 transferred to [facility] due to seizures.  Required intubation  Consults:  Infectious disease Neurology PCCM  Procedures:  ETT 12/2 >   Significant Diagnostic Tests:  MRI left MCA  Stroke LP 12/2 > Glucose <20, WBC 21, protein 304. Echo 12/2 > EF 55-60%, G1DD. LE duplex 12/2 > negative. EEG 12/3 >  MRI brain 12/3 >   Micro Data:  Blood culture 12 /1-we will follow Blood culture 11/29-negative Urine culture 11/29-negative Urinalysis 11/29-unremarkable Respiratory viral panel negative CSR 12/2 >  Antimicrobials:  Ampicillin 12 /1 > Cefepime11/29 > Acyclovir 12/2 >  Vancomycin 11/29 > Metronidazole 11/29 > 11/29  Interim history/subjective:  Seized overnight.  Required intubation. Per neuro having continued left sided discharges on EEG now.  Currently on keppra and dilantin.  Vimpat being added.  Objective   Blood pressure (!) 145/94, pulse (!) 112, temperature 98.2 F (36.8 C), temperature source Oral, resp. rate 20, height 5\' 3"  (1.6 m), weight 73.1 kg, last menstrual period 05/29/2015, SpO2 97 %.    Vent Mode: PRVC FiO2 (%):  [30 %-100  %] 30 % Set Rate:  [20 bmp] 20 bmp Vt Set:  [420 mL] 420 mL PEEP:  [5 cmH20] 5 cmH20 Plateau Pressure:  [15 cmH20-21 cmH20] 15 cmH20   Intake/Output Summary (Last 24 hours) at 03/28/2018 0916 Last data filed at 03/28/2018 0600 Gross per 24 hour  Intake 3474.51 ml  Output 1545 ml  Net 1929.51 ml   Filed Weights   03/12/2018 1323 03/25/18 0501 03/27/18 0500  Weight: 74.4 kg 74.8 kg 73.1 kg    Examination: General 57 year old female, sedated on vent, in NAD HEENT normocephalic atraumatic facial droop is appreciated. ETT in place. Neuro: Sedated, unresponsive. Pulmonary:  CTAB, on vent. Cardiac: RRR, no M/R/G. Abdomen soft nontender no organomegaly.  Assessment & Plan:   Acute metabolic encephalopathy further c/b left MCA stroke.  - Cont supportive care. - Neuro following, appreciate the assistance.  ? CNS infection vs carcinomatous meningitis - LP with low glucose and 21 WBC, gram stain and cytology pending. - F/u on LP results and repeat MRI. - Continue empiric abx / antivirals. - ID following, appreciate the assistance.  Seizures - unclear etiology. - AED's per neuro (keppra, dilantin, vimpat).  Fever/SIRS - etiology unclear. ? CNS infection vs oncological in origin (? tumor fever). - Cont empiric abx and follow cultures through completion.  Respiratory insufficiency - s/p intubation overnight 12/2. - Continue full vent support. - No weaning given seizures. - Bronchial hygiene. - Follow CXR.  Metastatic breast cancer - Advanced disease with bone metastases.  Recently completed course of chemotherapy. - Oncology following, appreciate the assistance.  Small bilateral pleural effusions. - No interventions  required.  Hyponatremia - hypovolemic vs secondary to malignancy. - Continue fluids, change from 50 to 100/hr. - Follow BMP.  Hypokalemia. - Replete K per tube, 80 mEq now. - Follow BMP.  Best practice:  Diet: N.p.o. DVT prophylaxis: Lovenox Mobility:  Bedrest Code Status: Full code-previous DNR status discussed with family Family Communication: Family at bedside Disposition: ICU.   CC time: 40 min.   Montey Hora, [place] [facility] Pager: [phone]  or [phone] 03/28/2018, 9:57 AM

## 2018-03-28 NOTE — Progress Notes (Signed)
IP PROGRESS NOTE  Subjective:   Olivia Patel had seizures yesterday.  She was transferred to [facility].  She was intubated for airway protection.  She remains intubated.  Her sister is at the bedside. Objective: Vital signs in last 24 hours: Blood pressure 129/90, pulse (!) 116, temperature 99.8 F (37.7 C), temperature source Axillary, resp. rate 20, height _0  (1.6 m), weight 161 lb 2.5 oz (73.1 kg), last menstrual period 05/29/2015, SpO2 100 %.  Intake/Output from previous day: 12/02 0701 - 12/03 0700 In: 3474.5 [I.V.:779.2; IV Piggyback:2695.3] Out: 1545 [Urine:1545]  Physical Exam: Not performed today    Lab Results: Recent Labs    03/27/18 0500 03/28/18 0609  WBC 12.2* 11.7*  HGB 8.6* 8.3*  HCT 27.2* 27.1*  PLT 138* 129*    BMET Recent Labs    03/28/18 0609 03/28/18 1350  NA 128* 130*  K 2.4* 3.6  CL 85* 90*  CO2 27 28  GLUCOSE 114* 131*  BUN 17 16  CREATININE 0.83 0.92  CALCIUM 9.8 10.1    No results found for: CEA1  Studies/Results: Dg Chest Port 1 View  Result Date: 03/27/2018 CLINICAL DATA:  57 year old female status post intubation. EXAM: PORTABLE CHEST 1 VIEW COMPARISON:  Earlier radiograph dated 03/27/2018 FINDINGS: An endotracheal tube is noted with tip close to the carina. Recommend retraction by approximately 2-3 cm for optimal positioning. Interval placement of an enteric tube which appears to extend into the distal stomach and loops back with tip in the region of the gastric fundus. Left pectoral Port-A-Cath in similar position. Slight improved aeration of the lungs. The appearance of the lungs and heart is otherwise similar to prior radiograph. IMPRESSION: 1. Endotracheal tube close to the carina. Recommend retraction by approximately 2-3 cm for optimal positioning. 2. Enteric tube with tip in the region of the gastric fundus. Electronically Signed   By: Anner Crete M.D.   On: 03/27/2018 22:46   Dg Chest Port 1 View  Result Date:  03/27/2018 CLINICAL DATA:  Fever and shortness of breath EXAM: PORTABLE CHEST 1 VIEW COMPARISON:  Yesterday FINDINGS: Porta catheter on the left with tip at the upper cavoatrial junction. Low volumes with interstitial coarsening and hazy density at the left base. There is improved aeration at the left base with better visualized diaphragm. Healing right-sided rib fractures. Known osseous metastatic disease. IMPRESSION: 1. Mild improvement in aeration at the left base. 2. Small pleural effusions known by recent chest CT. There is mildly improved aeration at the left base. Electronically Signed   By: Monte Fantasia M.D.   On: 03/27/2018 09:56   Vas [place] Lower Extremity Venous (dvt)  Result Date: 03/27/2018  Lower Venous Study Indications: FUO.  Limitations: Body habitus, poor ultrasound/tissue interface and patient immobility, patient movement. Performing Technologist: Oliver Hum RVT  Examination Guidelines: A complete evaluation includes B-mode imaging, spectral Doppler, color Doppler, and power Doppler as needed of all accessible portions of each vessel. Bilateral testing is considered an integral part of a complete examination. Limited examinations for reoccurring indications may be performed as noted.  Right Venous Findings: +---------+---------------+---------+-----------+----------+-------+          CompressibilityPhasicitySpontaneityPropertiesSummary +---------+---------------+---------+-----------+----------+-------+ CFV      Full           Yes      Yes                          +---------+---------------+---------+-----------+----------+-------+ SFJ  Full                                                 +---------+---------------+---------+-----------+----------+-------+ FV Prox  Full                                                 +---------+---------------+---------+-----------+----------+-------+ FV Mid   Full                                                  +---------+---------------+---------+-----------+----------+-------+ FV DistalFull                                                 +---------+---------------+---------+-----------+----------+-------+ PFV      Full                                                 +---------+---------------+---------+-----------+----------+-------+ POP      Full           Yes      Yes                          +---------+---------------+---------+-----------+----------+-------+ PTV      Full                                                 +---------+---------------+---------+-----------+----------+-------+ PERO     Full                                                 +---------+---------------+---------+-----------+----------+-------+  Left Venous Findings: +---------+---------------+---------+-----------+----------+-------+          CompressibilityPhasicitySpontaneityPropertiesSummary +---------+---------------+---------+-----------+----------+-------+ CFV      Full           Yes      Yes                          +---------+---------------+---------+-----------+----------+-------+ SFJ      Full                                                 +---------+---------------+---------+-----------+----------+-------+ FV Prox  Full                                                 +---------+---------------+---------+-----------+----------+-------+  FV Mid   Full                                                 +---------+---------------+---------+-----------+----------+-------+ FV DistalFull                                                 +---------+---------------+---------+-----------+----------+-------+ PFV      Full                                                 +---------+---------------+---------+-----------+----------+-------+ POP      Full           Yes      Yes                           +---------+---------------+---------+-----------+----------+-------+ PTV      Full                                                 +---------+---------------+---------+-----------+----------+-------+ PERO     Full                                                 +---------+---------------+---------+-----------+----------+-------+    Summary: Right: There is no evidence of deep vein thrombosis in the lower extremity. No cystic structure found in the popliteal fossa. Left: There is no evidence of deep vein thrombosis in the lower extremity. No cystic structure found in the popliteal fossa.  *See table(s) above for measurements and observations. Electronically signed by Ruta Hinds MD on 03/27/2018 at 5:38:53 PM.    Final    Dg Fluoro Guide Lumbar Puncture  Result Date: 03/27/2018 CLINICAL DATA:  Altered mental status and fever. History of breast cancer EXAM: DIAGNOSTIC LUMBAR PUNCTURE UNDER FLUOROSCOPIC GUIDANCE FLUOROSCOPY TIME:  Fluoroscopy Time:  0.3 minutes Radiation Exposure Index (if provided by the fluoroscopic device): 2 mGy Number of Acquired Spot Images: 0 PROCEDURE: Informed consent was obtained from the the patient's next of kin (her daughter) prior to the procedure. With the patient prone, the lower back was prepped with Betadine. 1% Lidocaine was used for local anesthesia. Lumbar puncture was performed at the L3-4 level using a 20 gauge needle with return of clear CSF. The patient was placed prone with great difficulty and decubitus positioning for accurate opening pressure was not feasible. 12 ml of CSF were obtained for laboratory studies. The patient tolerated the procedure well and there were no apparent complications. As the patient was leaving that apartment there may have been a seizure per report. When I saw the patient she had returned to recent baseline and was oxygenating normally. The patient's nurse traveled with the patient and witnessed the event. IMPRESSION: Successful  lumbar puncture at L3-4. Possible seizure activity after the procedure, not witnessed by me. Electronically  Signed   By: Monte Fantasia M.D.   On: 03/27/2018 15:32    Medications: I have reviewed the patient's current medications.  Assessment/Plan: 1. Metastatic breast cancer  Large right breast mass with diffuse skin thickening  CTs 09/19/2017 with mediastinal/hilar adenopathy, subcarinal adenopathy, widespread bone lesions, right breast mass with skin thickening  MRI of the brain 09/19/2017-negative for brain metastases  Ultrasound-guided biopsy of the right breast mass on 09/21/2017, invasive mammary carcinoma, HER-2 negative, ER negative, PR negative,Ki-67 90%  Weekly Taxol initiated 09/30/2017  Week 2 Taxol 10/07/2017  Week 3 Taxol/carboplatin added 10/14/2017  Cycle 2 weekly Taxol/carboplatin beginning 10/28/2017  Cycle 3 weekly Taxol/carboplatin beginning 11/25/2017  CT chest 12/22/2017-decrease in right breast mass and right axillary lymphadenopathy, unchanged bone metastases, 2 subcentimeter low-attenuation liver lesions- no direct IV contrast CT available for comparison  Cycle 4 weekly Taxol/carboplatin beginning 12/23/2017  Cycle 5 weekly Taxol/carboplatin beginning 01/20/2018  Cycle 6 weeklyTaxol/carboplatin beginning 02/17/2018 2.History of hypercalcemia, status post pamidronate 09/17/2017, calcitonin initiated 09/18/2017, Zometa 09/24/2017,Zometa 10/07/2017; 3.Dehydration secondary to hypercalcemia, resolved 4.Lytic bone lesions, rib fractures secondary to #1 5.Hypertension 6.  History ofrenal failure-? Secondary to ATN,resolved 7. [facility] hospital admission 09/17/2017-cultures negative, no apparent source for infection, tumor fever? 8. Anemia secondary to metastatic breast cancer and phlebotomy- 1 unit of packed red blood cells 09/30/2017;improved 10/07/2017.Progressive anemia 11/04/2017. Transfused 1 unit of packed red blood cells. 9.  Admission 03/13/2018  with a fever and sinus symptoms, status post treatment with antibiotics, cultures negative 10.  Admission 03/20/2018 with altered mental status- MRI brain with evidence of  left MCA distribution CVAs  Seizures, intubated for airway protection 03/27/2018  Lumbar puncture 03/27/2018-cytology positive for metastatic adenocarcinoma  Olivia Patel was admitted with altered mental status.  An MRI of the brain is suggestive of left MCA distribution strokes.  A lumbar puncture yesterday revealed a high protein and cell count.  The CSF cytology has returned positive for metastatic adenocarcinoma.  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  I discussed the poor prognosis with her sister.  The average survival in patients with carcinomatous meningitis is weeks to a few months.  This is in patients with a performance status better than Ms. Topping.  I estimate her survival to be measured in days to a week.  I recommend comfort care.  Her sister understands the poor prognosis and plans to discuss the situation with other family members tonight.  They will make a decision regarding extubation with the critical care service.  Recommendations: 1.  Continue seizure medications per neurology 2.  Wean from ventilator after further discussion with the family and if recommended by critical care medicine 3.  Comfort measures once weaned from the ventilator 4.  Please call Oncology as needed.  I will check on her over the next few days.  LOS: 4 days   Betsy Coder, MD   03/28/2018, 6:15 PM

## 2018-03-29 ENCOUNTER — Inpatient Hospital Stay (HOSPITAL_COMMUNITY): Payer: Medicare Other

## 2018-03-29 DIAGNOSIS — G40909 Epilepsy, unspecified, not intractable, without status epilepticus: Secondary | ICD-10-CM

## 2018-03-29 DIAGNOSIS — C801 Malignant (primary) neoplasm, unspecified: Secondary | ICD-10-CM

## 2018-03-29 DIAGNOSIS — C7949 Secondary malignant neoplasm of other parts of nervous system: Secondary | ICD-10-CM

## 2018-03-29 LAB — CBC
HCT: 27.2 % — ABNORMAL LOW (ref 36.0–46.0)
Hemoglobin: 8.7 g/dL — ABNORMAL LOW (ref 12.0–15.0)
MCH: 28.9 pg (ref 26.0–34.0)
MCHC: 32 g/dL (ref 30.0–36.0)
MCV: 90.4 fL (ref 80.0–100.0)
PLATELETS: 143 10*3/uL — AB (ref 150–400)
RBC: 3.01 MIL/uL — ABNORMAL LOW (ref 3.87–5.11)
RDW: 17.8 % — AB (ref 11.5–15.5)
WBC: 11.8 10*3/uL — AB (ref 4.0–10.5)
nRBC: 0 % (ref 0.0–0.2)

## 2018-03-29 LAB — BASIC METABOLIC PANEL
Anion gap: 15 (ref 5–15)
BUN: 18 mg/dL (ref 6–20)
CO2: 27 mmol/L (ref 22–32)
Calcium: 10.3 mg/dL (ref 8.9–10.3)
Chloride: 92 mmol/L — ABNORMAL LOW (ref 98–111)
Creatinine, Ser: 0.7 mg/dL (ref 0.44–1.00)
GFR calc Af Amer: >60 mL/min (ref >60)
GFR calc non Af Amer: >60 mL/min (ref >60)
Glucose, Bld: 111 mg/dL — ABNORMAL HIGH (ref 70–99)
Potassium: 3.1 mmol/L — ABNORMAL LOW (ref 3.5–5.1)
SODIUM: 134 mmol/L — AB (ref 135–145)

## 2018-03-29 LAB — CULTURE, BLOOD (ROUTINE X 2)
CULTURE: NO GROWTH
Culture: NO GROWTH
SPECIAL REQUESTS: ADEQUATE
Special Requests: ADEQUATE

## 2018-03-29 LAB — MAGNESIUM: Magnesium: 1.8 mg/dL (ref 1.7–2.4)

## 2018-03-29 LAB — PHOSPHORUS: Phosphorus: 3 mg/dL (ref 2.5–4.6)

## 2018-03-29 MED ORDER — ACETAMINOPHEN 325 MG PO TABS: 650.0000 mg | ORAL_TABLET | Freq: Four times a day (QID) | ORAL | Status: DC | PRN

## 2018-03-29 MED ORDER — GLYCOPYRROLATE 1 MG PO TABS
1.0000 mg | ORAL_TABLET | ORAL | Status: DC | PRN
  Filled 2018-03-29: qty 1

## 2018-03-29 MED ORDER — CHLORHEXIDINE GLUCONATE 0.12% ORAL RINSE (MEDLINE KIT)
15.0000 mL | Freq: Two times a day (BID) | OROMUCOSAL | Status: DC
  Administered 2018-03-29 – 2018-03-31 (×4): 15 mL via OROMUCOSAL

## 2018-03-29 MED ORDER — GLYCOPYRROLATE 0.2 MG/ML IJ SOLN: 0.2000 mg | INTRAMUSCULAR | Status: DC | PRN

## 2018-03-29 MED ORDER — GLYCOPYRROLATE 0.2 MG/ML IJ SOLN
0.2000 mg | INTRAMUSCULAR | Status: DC | PRN
  Administered 2018-03-30 – 2018-04-01 (×4): 0.2 mg via INTRAVENOUS
  Filled 2018-03-29 (×4): qty 1

## 2018-03-29 MED ORDER — DIPHENHYDRAMINE HCL 50 MG/ML IJ SOLN: 25.0000 mg | INTRAMUSCULAR | Status: DC | PRN

## 2018-03-29 MED ORDER — POLYVINYL ALCOHOL 1.4 % OP SOLN
1.0000 [drp] | Freq: Four times a day (QID) | OPHTHALMIC | Status: DC | PRN
  Filled 2018-03-29: qty 15

## 2018-03-29 MED ORDER — DEXTROSE 5 % IV SOLN
INTRAVENOUS | Status: DC
  Administered 2018-03-29: 18:00:00 via INTRAVENOUS

## 2018-03-29 MED ORDER — ORAL CARE MOUTH RINSE
15.0000 mL | OROMUCOSAL | Status: DC
  Administered 2018-03-29 – 2018-04-01 (×18): 15 mL via OROMUCOSAL

## 2018-03-29 MED ORDER — ACETAMINOPHEN 650 MG RE SUPP
650.0000 mg | Freq: Four times a day (QID) | RECTAL | Status: DC | PRN
  Administered 2018-03-29: 650 mg via RECTAL
  Filled 2018-03-29: qty 1

## 2018-03-29 NOTE — Progress Notes (Signed)
Nutrition Brief Note  Chart reviewed. Pt now transitioning to comfort care.  No further nutrition interventions warranted at this time.  Please re-consult as needed.   Mariana Single RD, LDN Clinical Nutrition Pager # [phone]

## 2018-03-29 NOTE — Progress Notes (Signed)
Inpatient Rehabilitation-Admissions Coordinator   An IP Rehab Consult Order was placed prior to patient transferring in house. Noted pt not appropriate for IP Rehab Consult. AC will sign off.   Please call if questions.   Jhonnie Garner, OTR/L  Rehab Admissions Coordinator  [phone] 03/29/2018 9:30 AM

## 2018-03-29 NOTE — Progress Notes (Signed)
OT Cancellation Note  Patient Details Name: Olivia Patel MRN: [id number] DOB: Jan 23, 1961   Cancelled Treatment:    Reason Eval/Treat Not Completed: Other (comment)(pt transitioned to comfort care. ) OT orders cancelled.  Please re-consult in future if needed.  Delight Stare, OT Acute Rehabilitation Services Pager [phone] Office [phone]   Delight Stare 03/29/2018, 10:44 AM

## 2018-03-29 NOTE — Progress Notes (Signed)
NAME:  Olivia Patel, MRN:  [id number], DOB:  1961/03/20, LOS: 5 ADMISSION DATE:  03/13/2018, CONSULTATION DATE:  03/26/18 REFERRING MD:  Algis Liming, CHIEF COMPLAINT:  SIRS   Brief History   Patient with a history of metastatic breast cancer-advanced local disease with bone metastasis Presented with a few days of malaise, fatigue, fevers and chills -Asked see for tachypnea in setting of new MCA stroke and encephalopathy Transferred to [facility] 12/2 for seizures.  Required intubation overnight.  Past Medical History  Stage IV breast cancer with metastasis to the bone, Hypertension Anemia Metastatic breast cancer completed cycle 6 of Taxol/carboplatin on 02/24/2018  [facility] Events   11/29 admitted for fever and weakness  12/1 acute encephalopathy, new stroke involving MCA on left.  Pulmonary asked to see due to tachypnea 12/2 transferred to [facility] due to seizures.  Required intubation 12/3 Code status changed to DNR after CNS cytology returned positive for metastatic adenocarcinoma 12/4 Transitioned to comfort care, terminal extubation once all family arrives which might not be until after 10pm  Consults:  Infectious disease Neurology PCCM  Procedures:  ETT 12/2 > planned extubation 12/4   Significant Diagnostic Tests:  MRI left MCA  Stroke LP 12/2 > Glucose <20, WBC 21, protein 304. Echo 12/2 > EF 55-60%, G1DD. LE duplex 12/2 > negative. EEG 12/3 >  MRI brain 12/3 >   Micro Data:  Blood culture 12 /1-we will follow Blood culture 11/29-negative Urine culture 11/29-negative Urinalysis 11/29-unremarkable Respiratory viral panel negative CSR 12/2 >  Antimicrobials:  Ampicillin 12 /1 > 12/4 Cefepime11/29 > 12/4 Acyclovir 12/2 > 12/4 Vancomycin 11/29 > 12/4 Metronidazole 11/29 > 11/29  Interim history/subjective:  CNS cytology returned with metastatic adenocarcinoma.  Met with family the evening of 12/3 to discuss results. Family emotional but understandable.  Code  status changed to DNR. 12/4 family planning to come to hospital and have terminal extubation (might not be until after 10pm).  Objective   Blood pressure (!) 143/85, pulse (!) 110, temperature 99.9 F (37.7 C), temperature source Axillary, resp. rate 16, height '5\' 3"'$  (1.6 m), weight 73 kg, last menstrual period 05/29/2015, SpO2 100 %.    Vent Mode: PRVC FiO2 (%):  [30 %] 30 % Set Rate:  [20 bmp] 20 bmp Vt Set:  [420 mL-450 mL] 420 mL PEEP:  [5 cmH20] 5 cmH20 Plateau Pressure:  [15 cmH20-18 cmH20] 16 cmH20   Intake/Output Summary (Last 24 hours) at 03/29/2018 0939 Last data filed at 03/29/2018 0900 Gross per 24 hour  Intake 2945.77 ml  Output 1600 ml  Net 1345.77 ml   Filed Weights   03/25/18 0501 03/27/18 0500 03/29/18 0409  Weight: 74.8 kg 73.1 kg 73 kg    Examination: General: 57 year old female, sedated on vent, in NAD HEENT:  / AT.  EEG leads in place.  ETT in place. Neuro: Sedated, unresponsive. Pulmonary:  CTAB, on vent. Cardiac: RRR, no M/R/G. Abdomen: soft nontender no organomegaly.  Assessment & Plan:   Acute metabolic encephalopathy further c/b left MCA stroke. 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  Oncology have weighed in and given family extremely poor prognosis with expected survival of days to a week. Seizures - presumed due to above. Respiratory insufficiency - s/p intubation overnight 12/2. Metastatic breast cancer - Advanced disease with bone metastases and now carcinomatous meningitis.   Hyponatremia. Hypokalemia.   In light of CSF findings which confirmed suspicion of carcinomatous meningitis which presents an extremely poor prognosis and terminal condition in  this pt who has metastatic breast CA at baseline, the family has opted to transition to full comfort care measures.  Oncology has weight in and has given family an expected survival of days to a week. Family has come to terms with this and has accepted that we  have done all that we can for Mrs. Efferson.  They do not want to see her suffer more.  Family will be coming to the hospital this evening 12/4 and once all family arrive, we will proceed with terminal extubation. If pt survives through the night, the family would like to have pt placed in hospice.  We will consult social work for assistance with placement.  Will also transfer pt to palliative floor following extubation and will ask TR[facility] to resume care starting in AM 12/5 with PCCM off at that time.   Best practice:  Diet: N.p.o. DVT prophylaxis: None. Mobility: Bedrest. Code Status: None. Family Communication: Son at bedside. Disposition: ICU.  Transfer to palliative floor after extubation.   Montey Hora, [facility] Pager: [phone]  or [phone] 03/29/2018, 9:54 AM

## 2018-03-29 NOTE — Progress Notes (Signed)
PT Cancellation Note  Patient Details Name: Janaiyah Blackard MRN: [id number] DOB: 03-26-1961   Cancelled Treatment:    Reason Eval/Treat Not Completed: Other (comment). Pt transitioned to comfort care. Pt no longer needs acute PT services. PT orders cancelled. Please re-consult in the future if needed.  Kittie Plater, PT, DPT Acute Rehabilitation Services Pager #: [phone] Office #: [phone]    Berline Lopes 03/29/2018, 9:48 AM

## 2018-03-29 NOTE — Progress Notes (Signed)
LTM discontinued - no skin breakdown at [facility]

## 2018-03-29 NOTE — Progress Notes (Signed)
CSF results noted  Will stop her anbx Discussed with Neurology.  Will be available as needed.

## 2018-03-29 NOTE — Procedures (Signed)
LTM-EEG Report  HISTORY: Continuous video-EEG monitoring performed for 57 year old with focal status epilepticus. ACQUISITION: International 10-20 system for electrode placement; 18 channels with additional eyes linked to ipsilateral ears and EKG. Additional T1-T2 electrodes were used. Continuous video recording obtained.   EEG NUMBER:  MEDICATIONS:  Day 2: see EMR  DAY #2: from 0900 03/28/18 to 0730 03/29/18  BACKGROUND: An overall medium voltage continuous recording with some spontaneous variability and reactivity. The background activity consisted of delta-theta activity bilaterally with sparse superimposed faster frequencies. State changes were seen with some sleep spindles at times. EPILEPTIFORM/PERIODIC ACTIVITY: Left frontotemporal sharp waves, quasiperiodic initially, improved but not resolved in the afternoon. These showed no ictal evolution. SEIZURES: none  EVENTS: none  EKG: no significant arrhythmia  SUMMARY: This was an abnormal continuous video EEG due to left frontotemporal epileptiform discharges. No seizures were seen. There was diffuse background slowing as well, indicative of a diffuse cerebral disturbance.

## 2018-03-29 NOTE — Progress Notes (Signed)
CSF results-cytology positive for metastatic adenocarcinoma. Extremely poor prognosis with such extensive carcinomatous meningitis. Oncology and PCCM of already discussed the poor prognosis and family is understanding of her current clinical condition and grim prognosis going forward. I had a conversation with the brother the bedside to indicated that the family will come in and decide to pursue comfort measures once all family members are together.  At this time we will discontinue the EEG. We will also discontinue the order for the MRI.  Neurological services will be available as needed. Call us with questions.   -- Amie Portland, MD [facility] Pager: [phone] If 7pm to 7am, please call on call as listed on AMION.

## 2018-03-30 LAB — HSV DNA BY PCR (REFERENCE LAB)
HSV 1 DNA: NEGATIVE
HSV 2 DNA: NEGATIVE

## 2018-03-30 MED ORDER — MORPHINE SULFATE (PF) 2 MG/ML IV SOLN
2.0000 mg | INTRAVENOUS | Status: DC | PRN
  Administered 2018-03-30 – 2018-04-01 (×8): 2 mg via INTRAVENOUS
  Filled 2018-03-30: qty 1

## 2018-03-30 MED ORDER — MORPHINE 100MG IN NS 100ML (1MG/ML) PREMIX INFUSION
0.0000 mg/h | INTRAVENOUS | Status: DC
  Administered 2018-03-30: 2 mg/h via INTRAVENOUS
  Administered 2018-03-31 – 2018-04-01 (×2): 5 mg/h via INTRAVENOUS
  Filled 2018-03-30 (×3): qty 100

## 2018-03-30 NOTE — Plan of Care (Signed)
Pt is on vent. Please call [facility] service when patient is off vent and goc confirmed.    Estill Cotta M.D. [facility] 03/30/2018, 7:41 AM

## 2018-03-30 NOTE — Procedures (Signed)
Extubation Procedure Note  Patient Details:   Name: Olivia Patel DOB: 07/12/1960 MRN: [id number]   Airway Documentation:    Vent end date: 03/30/18 Vent end time: 1000   Evaluation  O2 sats: stable throughout Complications: No apparent complications Patient did tolerate procedure well. Bilateral Breath Sounds: Clear, Diminished   No   Patient was extubated with end of life care withdrawal order. RT confirmed with family that they were ready prior to pulling ETT out. RN and family were all at bedside during extubation.   Renato Gails [name] 03/30/2018, 10:07 AM

## 2018-03-30 NOTE — Progress Notes (Signed)
NAME:  Olivia Patel, MRN:  [id number], DOB:  June 03, 1960, LOS: 6 ADMISSION DATE:  03/11/2018, CONSULTATION DATE:  03/26/18 REFERRING MD:  Algis Liming, CHIEF COMPLAINT:  SIRS   Brief History   Patient with a history of metastatic breast cancer-advanced local disease with bone metastasis Presented with a few days of malaise, fatigue, fevers and chills -Asked see for tachypnea in setting of new MCA stroke and encephalopathy Transferred to [facility] 12/2 for seizures.  Required intubation overnight.  Past Medical History  Stage IV breast cancer with metastasis to the bone, Hypertension Anemia Metastatic breast cancer completed cycle 6 of Taxol/carboplatin on 02/24/2018  [facility]   11/29 admitted for fever and weakness  12/1 acute encephalopathy, new stroke involving MCA on left.  Pulmonary asked to see due to tachypnea 12/2 transferred to [facility] due to seizures.  Required intubation 12/3 Code status changed to DNR after CNS cytology returned positive for metastatic adenocarcinoma 12/4 Transitioned to comfort care, terminal extubation once all family arrives which might not be until after 10pm  Consults:  Infectious disease Neurology PCCM  Procedures:  ETT 12/2 > planned extubation 12/4   Significant Diagnostic Tests:  MRI left MCA  Stroke LP 12/2 > Glucose <20, WBC 21, protein 304. Echo 12/2 > EF 55-60%, G1DD. LE duplex 12/2 > negative. EEG 12/3 >  MRI brain 12/3 >   Micro Data:  Blood culture 12 /1-we will follow Blood culture 11/29-negative Urine culture 11/29-negative Urinalysis 11/29-unremarkable Respiratory viral panel negative CSR 12/2 >  Antimicrobials:  Ampicillin 12 /1 > 12/4 Cefepime11/29 > 12/4 Acyclovir 12/2 > 12/4 Vancomycin 11/29 > 12/4 Metronidazole 11/29 > 11/29  Interim history/subjective:  CNS cytology returned with metastatic adenocarcinoma.  Met with family the evening of 12/3 to discuss results. Family emotional but understandable.  Code  status changed to DNR. 12/4 family planning to come to hospital and have terminal extubation (might not be until after 10pm). 03/30/2018 extubated started on morphine drip transferred to floor.  Objective   Blood pressure 123/89, pulse (!) 123, temperature 99.9 F (37.7 C), temperature source Axillary, resp. rate 20, height '5\' 3"'$  (1.6 m), weight 73 kg, last menstrual period 05/29/2015, SpO2 100 %.    Vent Mode: PRVC FiO2 (%):  [30 %] 30 % Set Rate:  [20 bmp] 20 bmp Vt Set:  [420 mL] 420 mL PEEP:  [5 cmH20] 5 cmH20 Plateau Pressure:  [14 cmH20-16 cmH20] 16 cmH20   Intake/Output Summary (Last 24 hours) at 03/30/2018 1022 Last data filed at 03/30/2018 0800 Gross per 24 hour  Intake 771.23 ml  Output 1375 ml  Net -603.77 ml   Filed Weights   03/25/18 0501 03/27/18 0500 03/29/18 0409  Weight: 74.8 kg 73.1 kg 73 kg    Examination: General: Obtunded female in no acute distress HEENT: JVD lymphadenopathy is appreciated Neuro: Obtunded CV: Sounds are distant PULM: Rapid shallow respirations, Port-A-Cath in place GI: Decreased bowel sounds Extremities: warm/dry, 1+ edema  Skin: no rashes or lesions   Assessment & Plan:   Acute metabolic encephalopathy further c/b left MCA stroke. 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  Oncology have weighed in and given family extremely poor prognosis with expected survival of days to a week. Seizures - presumed due to above. Respiratory insufficiency - s/p intubation overnight 12/2. Metastatic breast cancer - Advanced disease with bone metastases and now carcinomatous meningitis.   Hyponatremia. Hypokalemia.   Extubated 03/30/2018 Morphine drip started 03/30/2018 Plan to transfer to floor 03/30/2018  Have asked [facility] Dr. Tana Coast to assume care 03/31/2018 if she survives.   Best practice:  Diet: N.p.o. DVT prophylaxis: None. Mobility: Bedrest. Code Status: DNR Family Communication: Family updated bedside  03/30/2018.  Aware the patient is being transferred to the floor status post extubation with a morphine drip with the expectation of imminent demise. Disposition: ICU.  Transfer to palliative floor after extubation.   Richardson Landry Kody Brandl ACNP Maryanna Shape PCCM Pager [phone] till 1 pm If no answer page [phone] 03/30/2018, 10:23 AM

## 2018-03-30 NOTE — Social Work (Signed)
CSW acknowledging consult for comfort care. Will follow for disposition should hospice services or residential hospice become appropriate.   In regards to legal POA pt family will need to consult legal assistance with those arrangements given pt has minor children.  Alexander Mt, [place] Work [phone]

## 2018-03-31 ENCOUNTER — Inpatient Hospital Stay: Payer: Medicare Other

## 2018-03-31 ENCOUNTER — Inpatient Hospital Stay: Payer: Medicare Other | Admitting: Nurse Practitioner

## 2018-03-31 DIAGNOSIS — R0682 Tachypnea, not elsewhere classified: Secondary | ICD-10-CM

## 2018-03-31 LAB — CULTURE, BLOOD (ROUTINE X 2)
Culture: NO GROWTH
Culture: NO GROWTH
Special Requests: ADEQUATE
Special Requests: ADEQUATE

## 2018-03-31 LAB — CSF CULTURE W GRAM STAIN: Culture: NO GROWTH

## 2018-03-31 NOTE — Progress Notes (Signed)
She was extubated yesterday.  Her sister is at the bedside.  Olivia Patel appears comfortable.  She opens eyes, but not talking or following commands. Her sister confirmed the plan for comfort care.  Please call oncology as needed

## 2018-03-31 NOTE — Progress Notes (Signed)
PROGRESS NOTE    Olivia Patel  MEQ:[id number] DOB: 1961-02-18 DOA: 03/10/2018 PCP: Lance Sell, NP  Brief Narrative: 57 year old female with a history of metastatic breast cancer admitted 03/07/2018 with change in mental status which is thought to be secondary to carcinomatous meningitis and seizure and a new left MCA infarct.  Patient was intubated now extubated family wishes comfort care only.  I have seen the patient today with her sister is at the bedside patient resting comfortably.  Assessment & Plan:   Active Problems:   Essential hypertension   Anemia   Hypercalcemia   Breast cancer ([place])   Port-A-Cath in place   SIRS (systemic inflammatory response syndrome) (HCC)   Sepsis ([place])   Acute ischemic stroke ([place])   Cheyne-Stokes respiration   Acute encephalopathy   Febrile illness   Leukocytosis   Palliative care by specialist  #1 left MCA acute stroke #2 carcinomatous meningitis confirmed by metastatic adenocarcinoma and CSF followed by oncology #3 seizures secondary to #1 and 2 #4 metastatic breast cancer with mets to bone and carcinomatous meningitis  Plan patient is comfort care only.    Estimated body mass index is 28.51 kg/m as calculated from the following:   Height as of this encounter: _0  (1.6 m).   Weight as of this encounter: 73 kg.  DVT prophylaxis: None  Code Status: DO NOT RESUSCITATE Family Communication: Discussed with sister by the bedside Disposition Plan: Expected hospital death   Consultants: PCCM oncology   Subjective: Patient resting comfortably   Objective: Vitals:   03/30/18 1406 03/30/18 1522 03/30/18 2047 03/31/18 0606  BP: (!) 161/95   (!) 183/99  Pulse: (!) 133   (!) 127  Resp: 20 (!) 36 (!) 34   Temp: 99.3 F (37.4 C)   98.9 F (37.2 C)  TempSrc: Axillary   Oral  SpO2: 94% 94%  94%  Weight:      Height:        Intake/Output Summary (Last 24 hours) at 03/31/2018 1319 Last data filed at 03/31/2018  0904 Gross per 24 hour  Intake 417.8 ml  Output 1025 ml  Net -607.2 ml   Filed Weights   03/25/18 0501 03/27/18 0500 03/29/18 0409  Weight: 74.8 kg 73.1 kg 73 kg    Examination:  Patient resting in bed appears comfortable in no acute distress.  Data Reviewed: I have personally reviewed following labs and imaging studies  CBC: Recent Labs  Lab 03/02/2018 1400 03/25/18 0425 03/26/18 1025 03/27/18 0500 03/28/18 0609 03/29/18 0417  WBC 14.3* 11.7* 12.2* 12.2* 11.7* 11.8*  NEUTROABS 11.7*  --  10.0*  --   --   --   HGB 10.2* 8.6* 10.0* 8.6* 8.3* 8.7*  HCT 31.1* 27.5* 31.2* 27.2* 27.1* 27.2*  MCV 92.3 92.9 90.4 89.8 89.4 90.4  PLT 156 144* 161 138* 129* 297*   Basic Metabolic Panel: Recent Labs  Lab 03/08/2018 1400  03/26/18 1025 03/27/18 0500 03/28/18 0609 03/28/18 1350 03/29/18 0417  NA 134*   < > 127* 128* 128* 130* 134*  K 3.2*   < > 3.3* 3.6 2.4* 3.6 3.1*  CL 89*   < > 81* 85* 85* 90* 92*  CO2 27   < > _1 GLUCOSE 108*   < > 112* 103* 114* 131* 111*  BUN 15   < > _2 CREATININE 0.71   < > 0.66 0.57 0.83 0.92 0.70  CALCIUM 10.6*   < >  10.5* 9.4 9.8 10.1 10.3  MG 1.7  --   --   --   --   --  1.8  PHOS  --   --   --   --   --   --  3.0   < > = values in this interval not displayed.   GFR: Estimated Creatinine Clearance: 76 mL/min (by C-G formula based on SCr of 0.7 mg/dL). Liver Function Tests: Recent Labs  Lab 03/16/2018 1400 03/25/18 0425 03/26/18 1025  AST 33 26 25  ALT 36 26 24  ALKPHOS 195* 168* 176*  BILITOT 1.2 1.1 1.0  PROT 8.1 6.9 8.1  ALBUMIN 3.2* 2.7* 3.1*   No results for input(s): LIPASE, AMYLASE in the last 168 hours. Recent Labs  Lab 03/26/18 1025  AMMONIA 15   Coagulation Profile: Recent Labs  Lab 03/10/2018 1400  INR 1.31   Cardiac Enzymes: Recent Labs  Lab 03/26/18 0336  CKTOTAL 28*   BNP (last 3 results) No results for input(s): PROBNP in the last 8760 hours. HbA1C: No results for input(s): HGBA1C  in the last 72 hours. CBG: Recent Labs  Lab 03/26/18 0451  GLUCAP 127*   Lipid Profile: No results for input(s): CHOL, HDL, LDLCALC, TRIG, CHOLHDL, LDLDIRECT in the last 72 hours. Thyroid Function Tests: No results for input(s): TSH, T4TOTAL, FREET4, T3FREE, THYROIDAB in the last 72 hours. Anemia Panel: No results for input(s): VITAMINB12, FOLATE, FERRITIN, TIBC, IRON, RETICCTPCT in the last 72 hours. Sepsis Labs: Recent Labs  Lab 02/24/2018 1407 03/01/2018 1539 03/26/18 1025  LATICACIDVEN 1.51 1.24 1.6    Recent Results (from the past 240 hour(s))  Culture, blood (Routine x 2)     Status: None   Collection Time: 03/08/2018  2:01 PM  Result Value Ref Range Status   Specimen Description   Final    BLOOD LEFT ANTECUBITAL Performed at [facility] [address] [place], [place] [address]    Special Requests   Final    BOTTLES DRAWN AEROBIC AND ANAEROBIC Blood Culture adequate volume Performed at [facility] [address], [place], [place] [address]    Culture   Final    NO GROWTH 5 DAYS Performed at [facility], [place] [address] [place], [place] [address]    Report Status 03/29/2018 FINAL  Final  Culture, blood (Routine x 2)     Status: None   Collection Time: 03/05/2018  2:52 PM  Result Value Ref Range Status   Specimen Description   Final    BLOOD LEFT CHEST Performed at [facility] [address], [place], [place] [address]    Special Requests   Final    BOTTLES DRAWN AEROBIC AND ANAEROBIC Blood Culture adequate volume Performed at [facility] [address] [place], [place] [address]    Culture   Final    NO GROWTH 5 DAYS Performed at [facility], [place] [address], [place], [place] [address]    Report Status 03/29/2018 FINAL  Final  Urine culture     Status: None   Collection Time: 03/13/2018  7:16 PM  Result Value Ref Range Status   Specimen Description   Final     URINE, CLEAN CATCH Performed at [facility], [place] [address], [place], [place] [address]    Special Requests   Final    NONE Performed at [facility], [place] [address], [place], [place] [address]  Culture   Final    NO GROWTH Performed at [facility], [place] [address] [place], [place] [address]    Report Status 03/26/2018 FINAL  Final  Respiratory Panel by PCR     Status: None   Collection Time: 03/25/18 12:34 AM  Result Value Ref Range Status   Adenovirus NOT DETECTED NOT DETECTED Final   Coronavirus 229E NOT DETECTED NOT DETECTED Final   Coronavirus HKU1 NOT DETECTED NOT DETECTED Final   Coronavirus NL63 NOT DETECTED NOT DETECTED Final   Coronavirus OC43 NOT DETECTED NOT DETECTED Final   Metapneumovirus NOT DETECTED NOT DETECTED Final   Rhinovirus / Enterovirus NOT DETECTED NOT DETECTED Final   Influenza A NOT DETECTED NOT DETECTED Final   Influenza B NOT DETECTED NOT DETECTED Final   Parainfluenza Virus 1 NOT DETECTED NOT DETECTED Final   Parainfluenza Virus 2 NOT DETECTED NOT DETECTED Final   Parainfluenza Virus 3 NOT DETECTED NOT DETECTED Final   Parainfluenza Virus 4 NOT DETECTED NOT DETECTED Final   Respiratory Syncytial Virus NOT DETECTED NOT DETECTED Final   Bordetella pertussis NOT DETECTED NOT DETECTED Final   Chlamydophila pneumoniae NOT DETECTED NOT DETECTED Final   Mycoplasma pneumoniae NOT DETECTED NOT DETECTED Final    Comment: Performed at [facility], [place] [address], [place], [place] [address]  Culture, blood (Routine X 2) w Reflex to ID Panel     Status: None   Collection Time: 03/26/18 10:25 AM  Result Value Ref Range Status   Specimen Description   Final    BLOOD LEFT HAND Performed at [facility] [address], [place], [place] [address]    Special Requests   Final    BOTTLES DRAWN AEROBIC ONLY Blood Culture adequate volume Performed at [facility]  [address], [place], [place] [address]    Culture   Final    NO GROWTH 5 DAYS Performed at [facility], [place] [address] [place], [place] [address]    Report Status 03/31/2018 FINAL  Final  Culture, blood (Routine X 2) w Reflex to ID Panel     Status: None   Collection Time: 03/26/18 10:26 AM  Result Value Ref Range Status   Specimen Description   Final    BLOOD RIGHT ARM Performed at [facility], [place] [address], [place], [place] [address]    Special Requests   Final    BOTTLES DRAWN AEROBIC ONLY Blood Culture adequate volume Performed at [facility] [address], [place], [place] [address]    Culture   Final    NO GROWTH 5 DAYS Performed at [facility], [place] [address], [place], [place] [address]    Report Status 03/31/2018 FINAL  Final  CSF culture     Status: None   Collection Time: 03/27/18  3:03 PM  Result Value Ref Range Status   Specimen Description   Final    CSF Performed at [facility] [address], [place], [place] [address]    Special Requests   Final    NONE Performed at [facility], [place] [address] [place], [place] [address]    Gram Stain   Final    WBC PRESENT, PREDOMINANTLY MONONUCLEAR NO ORGANISMS SEEN Gram Stain Report Called to,Read Back By and Verified With: C.JONES,RN 03/27/18 _0  BY V.WILKINS Performed at [facility], [place] [address], [place], [place] [address]    Culture  Final    NO GROWTH 3 DAYS Performed at [facility], [place] [address], [place], [place] [address]    Report Status 03/31/2018 FINAL  Final  MRSA PCR Screening     Status: None   Collection Time: 03/27/18  6:39 PM  Result Value Ref Range Status   MRSA by PCR NEGATIVE NEGATIVE Final    Comment:        The GeneXpert MRSA Assay (FDA approved for NASAL specimens only), is one component of a comprehensive MRSA colonization surveillance program. It is not intended to  diagnose MRSA infection nor to guide or monitor treatment for MRSA infections. Performed at [facility], [place] [address] [place], [place] [address]          Radiology Studies: No results found.      Scheduled Meds: . chlorhexidine gluconate (MEDLINE KIT)  15 mL Mouth Rinse BID  . mouth rinse  15 mL Mouth Rinse 10 times per day   Continuous Infusions: . dextrose 20 mL/hr at 03/30/18 1200  . morphine 5 mg/hr (03/31/18 0903)     LOS: 7 days     Georgette Shell, MD [facility]  If 7PM-7AM, please contact night-coverage [email or web address] Password [email or web address] 03/31/2018, 1:19 PM

## 2018-03-31 NOTE — Care Management Important Message (Signed)
Important Message  Patient Details  Name: Olivia Patel MRN: [id number] Date of Birth: 1960-05-31   Medicare Important Message Given:  No  Patient is at the end of life out of respect no IM given  Orbie Pyo 03/31/2018, 3:56 PM

## 2018-04-01 DIAGNOSIS — R0602 Shortness of breath: Secondary | ICD-10-CM

## 2018-04-01 MED ORDER — SCOPOLAMINE 1 MG/3DAYS TD PT72
1.0000 | MEDICATED_PATCH | TRANSDERMAL | Status: DC
  Administered 2018-04-01: 1.5 mg via TRANSDERMAL
  Filled 2018-04-01: qty 1

## 2018-04-01 MED ORDER — LORAZEPAM 2 MG/ML IJ SOLN
2.0000 mg | INTRAMUSCULAR | Status: DC | PRN
  Administered 2018-04-01: 2 mg via INTRAVENOUS
  Filled 2018-04-01: qty 1

## 2018-04-14 ENCOUNTER — Other Ambulatory Visit: Payer: Medicare Other

## 2018-04-14 ENCOUNTER — Ambulatory Visit: Payer: Medicare Other | Admitting: Oncology

## 2018-04-14 ENCOUNTER — Ambulatory Visit: Payer: Medicare Other

## 2018-04-21 ENCOUNTER — Other Ambulatory Visit: Payer: Medicare Other

## 2018-04-21 ENCOUNTER — Ambulatory Visit: Payer: Medicare Other

## 2018-04-26 NOTE — Progress Notes (Addendum)
Called to room by  Pt brother Larose, noted pt not breathing and pulseless, verified by another nurse. Informed on call Noberto Retort 07-29-06 via text page that pt died at 87. Brother Saccente called pt daughters Kennyth Lose and Margreta Journey and notified that pt passed away. Family will be coming to see  patient. [facility] [employer] notified @ 07/28/2013 and pt is not a suitable donor. Wasted approx 25.5 ml Morphine drip at csrx container witnessed by Grace Isaac RN.

## 2018-04-26 NOTE — Progress Notes (Signed)
PROGRESS NOTE    Olivia Patel  YQI:[id number] DOB: 03/01/1961 DOA: 02/25/2018 PCP: Lance Sell, NP  Brief Narrative:58 year old female with a history of metastatic breast cancer admitted 03/15/2018 with change in mental status which is thought to be secondary to carcinomatous meningitis and seizure and a new left MCA infarct.  Patient was intubated now extubated family wishes comfort care only.  I have seen the patient today with her sister is at the bedside patient resting comfortably.  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  Estimated body mass index is 28.51 kg/m as calculated from the following:   Height as of this encounter: '5\' 3"'$  (1.6 m).   Weight as of this encounter: 73 kg.  DVT prophylaxis: None  Code Status: DO NOT RESUSCITATE Family Communication: Discussed with sister by the bedside Disposition Plan: Expected hospital death   Consultants: PCCM oncology       Estimated body mass index is 28.51 kg/m as calculated from the following:   Height as of this encounter: '5\' 3"'$  (1.6 m).   Weight as of this encounter: 73 kg.  Subjective: Resting .Marland Kitchenappears comfortable.brother by the bed side  Objective: Vitals:   03/30/18 1522 03/30/18 2047 03/31/18 0606 04-19-2018 0624  BP:   (!) 183/99 (!) 164/81  Pulse:   (!) 127 (!) 140  Resp: (!) 36  (!) 34    Temp:   98.9 F (37.2 C) 98.8 F (37.1 C)  TempSrc:   Oral Oral  SpO2: 94%  94% (!) 87%  Weight:      Height:        Intake/Output Summary (Last 24 hours) at 04-19-2018 1402 Last data filed at Apr 19, 2018 0600 Gross per 24 hour  Intake 376.28 ml  Output 750 ml  Net -373.72 ml   Filed Weights   03/25/18 0501 03/27/18 0500 03/29/18 0409  Weight: 74.8 kg 73.1 kg 73 kg    Examination:  General exam: Appears calm and comfortable  Respiratory system: Clear to auscultation. Respiratory effort normal. Cardiovascular system: S1 & S2 heard, RRR. No JVD, murmurs, rubs, gallops or clicks. No pedal edema. Gastrointestinal system: Abdomen is nondistended, soft and nontender. No organomegaly or masses felt. Normal bowel sounds heard. Central nervous system: Alert and oriented. No focal neurological deficits. Extremities: Symmetric 5 x 5 power. Skin: No rashes, lesions or ulcers Psychiatry: Judgement and insight appear normal. Mood & affect appropriate.     Data Reviewed: I have personally reviewed following labs and imaging studies  CBC: Recent Labs  Lab 03/26/18 1025 03/27/18 0500 03/28/18 0609 03/29/18 0417  WBC 12.2* 12.2* 11.7* 11.8*  NEUTROABS 10.0*  --   --   --   HGB 10.0* 8.6* 8.3* 8.7*  HCT 31.2* 27.2* 27.1* 27.2*  MCV 90.4 89.8 89.4 90.4  PLT 161 138* 129* 387*   Basic Metabolic Panel: Recent Labs  Lab  03/26/18 1025 03/27/18 0500 03/28/18 0609 03/28/18 1350 03/29/18 0417  NA 127* 128* 128* 130* 134*  K 3.3* 3.6 2.4* 3.6 3.1*  CL 81* 85* 85* 90* 92*  CO2 '31 30 27 28 27  '$ GLUCOSE 112* 103* 114* 131* 111*  BUN '11 16 17 16 18  '$ CREATININE 0.66 0.57 0.83 0.92 0.70  CALCIUM 10.5* 9.4 9.8 10.1 10.3  MG  --   --   --   --  1.8  PHOS  --   --   --   --  3.0   GFR: Estimated Creatinine Clearance: 76 mL/min (by C-G formula based on SCr of 0.7 mg/dL). Liver Function Tests: Recent Labs  Lab 03/26/18 1025  AST 25  ALT 24  ALKPHOS 176*  BILITOT 1.0    PROT 8.1  ALBUMIN 3.1*   No results for input(s): LIPASE, AMYLASE in the last 168 hours. Recent Labs  Lab 03/26/18 1025  AMMONIA 15   Coagulation Profile: No results for input(s): INR, PROTIME in the last 168 hours. Cardiac Enzymes: Recent Labs  Lab 03/26/18 0336  CKTOTAL 28*   BNP (last 3 results) No results for input(s): PROBNP in the last 8760 hours. HbA1C: No results for input(s): HGBA1C in the last 72 hours. CBG: Recent Labs  Lab 03/26/18 0451  GLUCAP 127*   Lipid Profile: No results for input(s): CHOL, HDL, LDLCALC, TRIG, CHOLHDL, LDLDIRECT in the last 72 hours. Thyroid Function Tests: No results for input(s): TSH, T4TOTAL, FREET4, T3FREE, THYROIDAB in the last 72 hours. Anemia Panel: No results for input(s): VITAMINB12, FOLATE, FERRITIN, TIBC, IRON, RETICCTPCT in the last 72 hours. Sepsis Labs: Recent Labs  Lab 03/26/18 1025  LATICACIDVEN 1.6    Recent Results (from the past 240 hour(s))  Culture, blood (Routine x 2)     Status: None   Collection Time: 03/09/2018  2:01 PM  Result Value Ref Range Status   Specimen Description   Final    BLOOD LEFT ANTECUBITAL Performed at [facility] [address] [place], [place] [address]    Special Requests   Final    BOTTLES DRAWN AEROBIC AND ANAEROBIC Blood Culture adequate volume Performed at [facility] [address] [place], [place] [address]    Culture   Final    NO GROWTH 5 DAYS Performed at [facility], [place] [address], [place], [place] [address]    Report Status 03/29/2018 FINAL  Final  Culture, blood (Routine x 2)     Status: None   Collection Time: 02/28/2018  2:52 PM  Result Value Ref Range Status   Specimen Description   Final    BLOOD LEFT CHEST Performed at [facility] [address], [place], [place] [address]    Special Requests   Final    BOTTLES DRAWN AEROBIC AND ANAEROBIC Blood Culture adequate volume Performed at  [facility] [address], [place], [place] [address]    Culture   Final    NO GROWTH 5 DAYS Performed at [facility], [place] [address], [place], [place] [address]    Report Status 03/29/2018 FINAL  Final  Urine culture     Status: None   Collection Time: 03/08/2018  7:16 PM  Result Value Ref Range Status   Specimen Description   Final    URINE, CLEAN CATCH Performed at [facility], [place] [address], [place], [place] [address]    Special Requests  Final    NONE Performed at [facility], [place] [address], [place], [place] [address]    Culture   Final    NO GROWTH Performed at [facility], [place] [address] [place], [place] [address]    Report Status 03/26/2018 FINAL  Final  Respiratory Panel by PCR     Status: None   Collection Time: 03/25/18 12:34 AM  Result Value Ref Range Status   Adenovirus NOT DETECTED NOT DETECTED Final   Coronavirus 229E NOT DETECTED NOT DETECTED Final   Coronavirus HKU1 NOT DETECTED NOT DETECTED Final   Coronavirus NL63 NOT DETECTED NOT DETECTED Final   Coronavirus OC43 NOT DETECTED NOT DETECTED Final   Metapneumovirus NOT DETECTED NOT DETECTED Final   Rhinovirus / Enterovirus NOT DETECTED NOT DETECTED Final   Influenza A NOT DETECTED NOT DETECTED Final   Influenza B NOT DETECTED NOT DETECTED Final   Parainfluenza Virus 1 NOT DETECTED NOT DETECTED Final   Parainfluenza Virus 2 NOT DETECTED NOT DETECTED Final   Parainfluenza Virus 3 NOT DETECTED NOT DETECTED Final   Parainfluenza Virus 4 NOT DETECTED NOT DETECTED Final   Respiratory Syncytial Virus NOT DETECTED NOT DETECTED Final   Bordetella pertussis NOT DETECTED NOT DETECTED Final   Chlamydophila pneumoniae NOT DETECTED NOT DETECTED Final   Mycoplasma pneumoniae NOT DETECTED NOT DETECTED Final    Comment: Performed at [facility], [place] [address] [place], [place] [address]  Culture, blood (Routine X 2) w Reflex to ID Panel      Status: None   Collection Time: 03/26/18 10:25 AM  Result Value Ref Range Status   Specimen Description   Final    BLOOD LEFT HAND Performed at [facility] [address], [place], [place] [address]    Special Requests   Final    BOTTLES DRAWN AEROBIC ONLY Blood Culture adequate volume Performed at [facility] [address], [place], [place] [address]    Culture   Final    NO GROWTH 5 DAYS Performed at [facility], [place] [address] [place], [place] [address]    Report Status 03/31/2018 FINAL  Final  Culture, blood (Routine X 2) w Reflex to ID Panel     Status: None   Collection Time: 03/26/18 10:26 AM  Result Value Ref Range Status   Specimen Description   Final    BLOOD RIGHT ARM Performed at [facility], [place] [address] [place], [place] [address]    Special Requests   Final    BOTTLES DRAWN AEROBIC ONLY Blood Culture adequate volume Performed at [facility] [address] [place], [place] [address]    Culture   Final    NO GROWTH 5 DAYS Performed at [facility], [place] [address], [place], [place] [address]    Report Status 03/31/2018 FINAL  Final  CSF culture     Status: None   Collection Time: 03/27/18  3:03 PM  Result Value Ref Range Status   Specimen Description   Final    CSF Performed at [facility] [address], [place], [place] [address]    Special Requests   Final    NONE Performed at [facility], [place] [address], [place],  [address]    Gram Stain   Final    WBC PRESENT, PREDOMINANTLY MONONUCLEAR NO ORGANISMS SEEN Gram Stain Report Called to,Read Back By and Verified With: C.JONES,RN  03/27/18 '@1724'$  BY V.WILKINS Performed at [facility], [place] [address], [place], [place] [address]    Culture   Final    NO GROWTH 3 DAYS Performed at [facility], [place] [address], [place], [place] [address]    Report  Status 03/31/2018 FINAL  Final  MRSA PCR Screening     Status: None   Collection Time: 03/27/18  6:39 PM  Result Value Ref Range Status   MRSA by PCR NEGATIVE NEGATIVE Final    Comment:        The GeneXpert MRSA Assay (FDA approved for NASAL specimens only), is one component of a comprehensive MRSA colonization surveillance program. It is not intended to diagnose MRSA infection nor to guide or monitor treatment for MRSA infections. Performed at [facility], [place] [address], [place], [place] [address]          Radiology Studies: No results found.      Scheduled Meds: . chlorhexidine gluconate (MEDLINE KIT)  15 mL Mouth Rinse BID  . mouth rinse  15 mL Mouth Rinse 10 times per day   Continuous Infusions: . dextrose 10 mL/hr at 04/03/18 0600  . morphine 5 mg/hr (2018/04/03 0724)     LOS: 8 days        Georgette Shell, MD [facility]   If 7PM-7AM, please contact night-coverage [email or web address] Password [email or web address] 04/03/18, 2:02 PM

## 2018-04-26 NOTE — Plan of Care (Signed)
?  Problem: Clinical Measurements: ?Goal: Ability to maintain clinical measurements within normal limits will improve ?Outcome: Not Progressing ?  ?

## 2018-04-26 NOTE — Progress Notes (Signed)
Nurse Notified.

## 2018-04-26 NOTE — Death Summary Note (Signed)
Death Summary  Olivia Patel JYN:[id number] DOB: [birth year] DOA: 04-21-18  PCP: Lance Sell, NP  Admit date: 2018/04/21 Date of Death: 05/01/18 Time of Death: 1951-08-01 Notification: Lance Sell, NP notified of death of Olivia Patel 06, 2020   History of present illness:  Olivia Patel is a 58 y.o. female with a history of metastatic breast cancer with mets to the bone and meninges confirmed by CSF examination of metastatic adenocarcinoma. 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    Significant Diagnostic Studies: Ct Angio Head W Or Wo Contrast  Result Date: 03/26/2018 CLINICAL DATA:  Aphasia. EXAM: CT ANGIOGRAPHY HEAD AND NECK TECHNIQUE: Multidetector CT imaging of the head and neck was performed using the standard protocol during bolus administration of intravenous contrast. Multiplanar CT image reconstructions and MIPs were obtained to evaluate the vascular anatomy. Carotid stenosis measurements (when applicable) are obtained utilizing NASCET criteria, using the distal internal carotid diameter as the denominator. CONTRAST:  169mL ISOVUE-370 IOPAMIDOL (ISOVUE-370) INJECTION 76% COMPARISON:  Neck CT 09/19/2017. FINDINGS: The study is mildly to moderately motion degraded. CTA NECK FINDINGS Aortic arch: Normal variant aortic arch branching pattern with common origin of the brachiocephalic and left common carotid arteries. Widely patent arch vessel origins. Right carotid system: Patent without evidence of stenosis or dissection. Left carotid system: Patent without evidence of  stenosis or dissection. Vertebral arteries: Patent without stenosis or dissection identified with V2 assessment limited by motion artifact. Slightly dominant right vertebral artery. Skeleton: Known osseous metastatic disease including a 2 cm right frontal skull lesion. Extensive metastatic disease in the spine including a C5 lesion with chronic pathologic fracture. Other neck: No mass or enlarged lymph nodes identified. Upper chest: Bilateral pleural effusions. Review of the MIP images confirms the above findings CTA HEAD FINDINGS Anterior circulation: Internal carotid arteries are patent from skull base to carotid termini with minimal nonstenotic plaque. A 2 mm outpouching is noted from the left supraclinoid ICA in the posterior communicating region without a vessel clearly seen arising from it. The M1 segments and MCA bifurcations are widely patent bilaterally. There is MCA branch vessel irregularity and attenuation bilaterally, more notable on the left where there are multiple missing distal M2/M3 and more distal branch vessels, particularly in the inferior division supplying the temporoparietal brain. There is no proximal M2 occlusion. ACAs are patent with moderate branch vessel irregularity but no significant proximal stenosis. Posterior circulation: The intracranial vertebral arteries are patent to the basilar. Patent PICA and SCA origins are identified bilaterally. The basilar artery is widely patent. Posterior communicating arteries are diminutive or absent. PCAs are patent with branch vessel irregularity and attenuation but no significant proximal stenosis. No aneurysm is identified. Venous sinuses: As permitted by contrast timing, patent. Anatomic variants: None. Delayed phase: Possible early loss of cortical gray-white differentiation in the posterior left temporal lobe with assessment limited by motion artifact. No gross enhancing mass lesion. Review of the MIP images confirms the above findings  IMPRESSION: 1. Motion degraded examination without large vessel occlusion. 2. Multiple missing distal left MCA branch vessels with possible early changes of acute posterior left temporal lobe infarct. 3. Widely patent carotid arteries. 4. 2 mm  left supraclinoid ICA aneurysm versus infundibulum. 5. Widespread osseous metastatic disease. The study was reviewed in person with Dr. Leonel Ramsay on 03/26/2018 at 3:05 p.m. Electronically Signed   By: Losasso Bores M.D.   On: 03/26/2018 15:23   Dg Chest 2 View  Result Date: 03/23/2018 CLINICAL DATA:  Weakness and confusion. EXAM: CHEST - 2 VIEW COMPARISON:  03/13/2018.  09/24/2017.  CT 12/22/2017. FINDINGS: PowerPort catheter with tip over superior vena cava. Heart size normal. Radiopacities noted over the ribs bilaterally are again noted and most consistent with healing fractures as noted on prior chest x-ray of 03/13/2018 and CT 12/22/2017. Small bilateral pleural effusions. No pneumothorax. IMPRESSION: 1.  PowerPort catheter stable position. 2. Radiopacities previously noted over the ribs bilaterally are again noted and again are most consistent with healing fractures as noted on prior chest x-ray of 03/13/2018 and chest CT of 12/22/2017. Small bilateral pleural effusions. No pneumothorax. Electronically Signed   By: Marcello Moores  Register   On: 03/08/2018 13:59   Dg Chest 2 View  Result Date: 03/13/2018 CLINICAL DATA:  Cough and fever EXAM: CHEST - 2 VIEW COMPARISON:  CT chest 12/22/2017 and CXR 03/05/2018 FINDINGS: Bilateral healing rib fractures are noted accounting for areas of subtle opacity the left lower lobe and right mid lung. No overt pulmonary edema, effusion or pneumothorax. Heart size and mediastinal contours are stable and within normal limits. Port catheter tip terminates at the cavoatrial junction without complicating features IMPRESSION: Healing bilateral rib fractures are believed to account for subtle opacities projecting over the right mid lung and  left lung base. No active pulmonary disease. Electronically Signed   By: Ashley Royalty M.D.   On: 03/13/2018 18:13   Dg Chest 2 View  Result Date: 03/05/2018 CLINICAL DATA:  58 year old female with history of stuffy nose, fever, sore throat and nonproductive cough. EXAM: CHEST - 2 VIEW COMPARISON:  Chest x-ray 09/24/2017. FINDINGS: Lung volumes are normal. No consolidative airspace disease. No pleural effusions. No pneumothorax. No pulmonary nodule or mass noted. Pulmonary vasculature and the cardiomediastinal silhouette are within normal limits. Multiple old healed bilateral rib fractures. Left internal jugular single-lumen power porta cath with tip terminating at the superior cavoatrial junction. IMPRESSION: 1. No radiographic evidence of acute cardiopulmonary disease. Electronically Signed   By: Vinnie Langton M.D.   On: 03/05/2018 12:32   Ct Head Wo Contrast  Result Date: 03/26/2018 CLINICAL DATA:  Metastatic breast cancer, fever, rapid response, altered mental status EXAM: CT HEAD WITHOUT CONTRAST TECHNIQUE: Contiguous axial images were obtained from the base of the skull through the vertex without intravenous contrast. COMPARISON:  09/17/2017 FINDINGS: Brain: Diffuse patchy white matter hypoattenuation bilaterally throughout both cerebral hemispheres compatible with chronic white matter microvascular changes, slight progression compared to the prior study. No acute intracranial hemorrhage, midline shift, large mass lesion, definite new infarction, or extra-axial fluid collection. No focal mass effect or edema. Cisterns are patent. No gross cerebellar abnormality. Exam is limited with some motion artifact. Vascular: No hyperdense vessel or unexpected calcification. Skull: Previously described tiny lucent areas throughout the skull are less apparent. Patient does have a history of osseous metastatic disease. No displaced or depressed fracture. Sinuses/Orbits: Orbits are symmetric. Minor right maxillary  sinus mucosal thickening. Otherwise sinuses and mastoids are clear. Other: None. IMPRESSION: Limited with motion artifact and without IV contrast. This limits evaluation for intracranial metastases. Slight progression of white matter microvascular changes. No acute intracranial hemorrhage or mass effect. Electronically Signed   By: Jerilynn Mages.  Shick M.D.  On: 03/26/2018 08:51   Ct Angio Neck W Or Wo Contrast  Result Date: 03/26/2018 CLINICAL DATA:  Aphasia. EXAM: CT ANGIOGRAPHY HEAD AND NECK TECHNIQUE: Multidetector CT imaging of the head and neck was performed using the standard protocol during bolus administration of intravenous contrast. Multiplanar CT image reconstructions and MIPs were obtained to evaluate the vascular anatomy. Carotid stenosis measurements (when applicable) are obtained utilizing NASCET criteria, using the distal internal carotid diameter as the denominator. CONTRAST:  150mL ISOVUE-370 IOPAMIDOL (ISOVUE-370) INJECTION 76% COMPARISON:  Neck CT 09/19/2017. FINDINGS: The study is mildly to moderately motion degraded. CTA NECK FINDINGS Aortic arch: Normal variant aortic arch branching pattern with common origin of the brachiocephalic and left common carotid arteries. Widely patent arch vessel origins. Right carotid system: Patent without evidence of stenosis or dissection. Left carotid system: Patent without evidence of stenosis or dissection. Vertebral arteries: Patent without stenosis or dissection identified with V2 assessment limited by motion artifact. Slightly dominant right vertebral artery. Skeleton: Known osseous metastatic disease including a 2 cm right frontal skull lesion. Extensive metastatic disease in the spine including a C5 lesion with chronic pathologic fracture. Other neck: No mass or enlarged lymph nodes identified. Upper chest: Bilateral pleural effusions. Review of the MIP images confirms the above findings CTA HEAD FINDINGS Anterior circulation: Internal carotid arteries are  patent from skull base to carotid termini with minimal nonstenotic plaque. A 2 mm outpouching is noted from the left supraclinoid ICA in the posterior communicating region without a vessel clearly seen arising from it. The M1 segments and MCA bifurcations are widely patent bilaterally. There is MCA branch vessel irregularity and attenuation bilaterally, more notable on the left where there are multiple missing distal M2/M3 and more distal branch vessels, particularly in the inferior division supplying the temporoparietal brain. There is no proximal M2 occlusion. ACAs are patent with moderate branch vessel irregularity but no significant proximal stenosis. Posterior circulation: The intracranial vertebral arteries are patent to the basilar. Patent PICA and SCA origins are identified bilaterally. The basilar artery is widely patent. Posterior communicating arteries are diminutive or absent. PCAs are patent with branch vessel irregularity and attenuation but no significant proximal stenosis. No aneurysm is identified. Venous sinuses: As permitted by contrast timing, patent. Anatomic variants: None. Delayed phase: Possible early loss of cortical gray-white differentiation in the posterior left temporal lobe with assessment limited by motion artifact. No gross enhancing mass lesion. Review of the MIP images confirms the above findings IMPRESSION: 1. Motion degraded examination without large vessel occlusion. 2. Multiple missing distal left MCA branch vessels with possible early changes of acute posterior left temporal lobe infarct. 3. Widely patent carotid arteries. 4. 2 mm left supraclinoid ICA aneurysm versus infundibulum. 5. Widespread osseous metastatic disease. The study was reviewed in person with Dr. Leonel Ramsay on 03/26/2018 at 3:05 p.m. Electronically Signed   By: Ojeda Bores M.D.   On: 03/26/2018 15:23   Ct Chest W Contrast  Result Date: 03/25/2018 CLINICAL DATA:  Fever of unknown origin.  Stage 4 breast  cancer. EXAM: CT CHEST, ABDOMEN, AND PELVIS WITH CONTRAST TECHNIQUE: Multidetector CT imaging of the chest, abdomen and pelvis was performed following the standard protocol during bolus administration of intravenous contrast. CONTRAST:  15mL OMNIPAQUE IOHEXOL 300 MG/ML SOLN, 66mL OMNIPAQUE IOHEXOL 300 MG/ML SOLN COMPARISON:  CT chest 12/22/2017 and CT AP 09/19/2017. FINDINGS: CT CHEST FINDINGS Cardiovascular: The heart size appears normal. No pericardial effusion. Mediastinum/Nodes: Right axillary lymph node measures 1.2 cm, image 21/3. Previously 1.1 cm.  No supraclavicular adenopathy. No enlarged left axillary lymph nodes. Normal appearance of the thyroid gland. The trachea appears patent and is midline. Small hiatal hernia noted. No mediastinal or hilar adenopathy. Lungs/Pleura: Bilateral pleural effusions identified, increased from previous exam. There is no airspace consolidation identified. Left upper lobe lung nodule measures 6 mm and is unchanged, image 61/5. No additional pulmonary nodules noted. Musculoskeletal: There is diffuse skin thickening overlying the right breast. Right breast mass is again identified containing clip measuring 2.1 cm, image 28/3. Previously 2.6 cm. Mixed lytic and sclerotic bone metastases are identified throughout the visualized axial and appendicular skeleton. This appears progressive when compared with the previous exam. For example, within the proximal body of sternum there is near complete loss of the anterior cortex, image 82/7. New from previous study. Lucent lesion within the T10 vertebral body measures 1.1 cm, image 84/7. Previously 0.5 cm. Lucent lesion involving the T9 vertebra with pathologic fracture measures 1.2 cm, image 90/7. Previously 0.4 cm. T3 lucent lesion measures 1.7 cm, image 84/7. New from previous exam. CT ABDOMEN PELVIS FINDINGS Hepatobiliary: There are at least 5 ill-defined lucent lesions within the liver which are suspicious for metastatic disease. The  largest is in segment 7 measuring 2.2 cm. Not previously characterized. Gallbladder normal. No biliary dilatation. Pancreas: Unremarkable. No pancreatic ductal dilatation or surrounding inflammatory changes. Spleen: Normal in size without focal abnormality. Adrenals/Urinary Tract: Adrenal glands are unremarkable. Kidneys are normal, without renal calculi, focal lesion, or hydronephrosis. Bladder is unremarkable. Stomach/Bowel: Small hiatal hernia. The small bowel loops have a normal course and caliber. No obstruction. No pathologic dilatation of the colon. Similar appearance of the appendix which contains gas within its lumen measuring 8 mm in diameter. Mild periappendiceal haziness noted, image 71/6. No pathologic dilatation of the colon. Vascular/Lymphatic: Normal appearance of the abdominal aorta. No enlarged abdominopelvic lymph nodes. Reproductive: Partially calcified and enlarged fibroid uterus noted. Other: Small amount of free fluid noted within the pelvis. Musculoskeletal: Extensive lytic bone metastases are identified. This has increased when compared with the previous exam. New pathologic fracture involves the L4 vertebra. IMPRESSION: 1. No highly specific findings identified to indicate patient's fever of unknown origin. The appendix is visualized and mildly increased in caliber measuring 8 mm. There is mild periappendiceal haziness is well. No significant periappendiceal free fluid identified. Correlation for any clinical signs or symptoms of appendicitis advised. 2. There has been interval progression of diffuse lytic bone metastases with new pathologic fractures involving T9 and L4 vertebra. 3. Multiple low-attenuation lesions within the liver, worrisome for metastatic disease. Not previously characterized. Electronically Signed   By: Kerby Moors M.D.   On: 03/25/2018 18:17   Mr Brain Wo Contrast  Addendum Date: 03/26/2018   ADDENDUM REPORT: 03/26/2018 15:37 ADDENDUM: Taking into account the  findings on the subsequent CTA, some of the asymmetric diffusion signal in the left MCA territory originally attributed to artifact may in fact reflect early changes of acute infarct, specifically in the lateral left temporal lobe, left insula, and possibly operculum. This MRI was reviewed again with Dr. Leonel Ramsay in person at the time of CTA interpretation. Electronically Signed   By: Berg Bores M.D.   On: 03/26/2018 15:37   Result Date: 03/26/2018 CLINICAL DATA:  Aphasia.  History of breast cancer. EXAM: MRI HEAD WITHOUT CONTRAST TECHNIQUE: Multiplanar, multiecho pulse sequences of the brain and surrounding structures were obtained without intravenous contrast. COMPARISON:  Head CT 03/26/2018 and MRI 09/19/2017 FINDINGS: The examination was aborted due to  patient agitation. Only a moderately motion degraded axial diffusion sequence was obtained. There is a 6 mm focus of restricted diffusion in the left paramedian pons consistent with acute infarct. Supratentorial signal is mildly asymmetric between the left and right cerebral hemispheres which is felt to be due to artifact without a convincing sizable acute infarct identified. A chronic right frontal skull lesion is incompletely evaluated. IMPRESSION: Motion degraded diffusion only examination demonstrating a 6 mm acute infarct (less likely metastasis) in the left pons. No convincing acute supratentorial infarct. The study was reviewed in person with Dr. Leonel Ramsay on 03/26/2018 at 21:58 p.m. Electronically Signed: By: Mattos Bores M.D. On: 03/26/2018 14:18   Ct Abdomen Pelvis W Contrast  Result Date: 03/25/2018 CLINICAL DATA:  Fever of unknown origin.  Stage 4 breast cancer. EXAM: CT CHEST, ABDOMEN, AND PELVIS WITH CONTRAST TECHNIQUE: Multidetector CT imaging of the chest, abdomen and pelvis was performed following the standard protocol during bolus administration of intravenous contrast. CONTRAST:  170mL OMNIPAQUE IOHEXOL 300 MG/ML SOLN, 76mL  OMNIPAQUE IOHEXOL 300 MG/ML SOLN COMPARISON:  CT chest 12/22/2017 and CT AP 09/19/2017. FINDINGS: CT CHEST FINDINGS Cardiovascular: The heart size appears normal. No pericardial effusion. Mediastinum/Nodes: Right axillary lymph node measures 1.2 cm, image 21/3. Previously 1.1 cm. No supraclavicular adenopathy. No enlarged left axillary lymph nodes. Normal appearance of the thyroid gland. The trachea appears patent and is midline. Small hiatal hernia noted. No mediastinal or hilar adenopathy. Lungs/Pleura: Bilateral pleural effusions identified, increased from previous exam. There is no airspace consolidation identified. Left upper lobe lung nodule measures 6 mm and is unchanged, image 61/5. No additional pulmonary nodules noted. Musculoskeletal: There is diffuse skin thickening overlying the right breast. Right breast mass is again identified containing clip measuring 2.1 cm, image 28/3. Previously 2.6 cm. Mixed lytic and sclerotic bone metastases are identified throughout the visualized axial and appendicular skeleton. This appears progressive when compared with the previous exam. For example, within the proximal body of sternum there is near complete loss of the anterior cortex, image 82/7. New from previous study. Lucent lesion within the T10 vertebral body measures 1.1 cm, image 84/7. Previously 0.5 cm. Lucent lesion involving the T9 vertebra with pathologic fracture measures 1.2 cm, image 90/7. Previously 0.4 cm. T3 lucent lesion measures 1.7 cm, image 84/7. New from previous exam. CT ABDOMEN PELVIS FINDINGS Hepatobiliary: There are at least 5 ill-defined lucent lesions within the liver which are suspicious for metastatic disease. The largest is in segment 7 measuring 2.2 cm. Not previously characterized. Gallbladder normal. No biliary dilatation. Pancreas: Unremarkable. No pancreatic ductal dilatation or surrounding inflammatory changes. Spleen: Normal in size without focal abnormality. Adrenals/Urinary Tract:  Adrenal glands are unremarkable. Kidneys are normal, without renal calculi, focal lesion, or hydronephrosis. Bladder is unremarkable. Stomach/Bowel: Small hiatal hernia. The small bowel loops have a normal course and caliber. No obstruction. No pathologic dilatation of the colon. Similar appearance of the appendix which contains gas within its lumen measuring 8 mm in diameter. Mild periappendiceal haziness noted, image 71/6. No pathologic dilatation of the colon. Vascular/Lymphatic: Normal appearance of the abdominal aorta. No enlarged abdominopelvic lymph nodes. Reproductive: Partially calcified and enlarged fibroid uterus noted. Other: Small amount of free fluid noted within the pelvis. Musculoskeletal: Extensive lytic bone metastases are identified. This has increased when compared with the previous exam. New pathologic fracture involves the L4 vertebra. IMPRESSION: 1. No highly specific findings identified to indicate patient's fever of unknown origin. The appendix is visualized and mildly increased in caliber  measuring 8 mm. There is mild periappendiceal haziness is well. No significant periappendiceal free fluid identified. Correlation for any clinical signs or symptoms of appendicitis advised. 2. There has been interval progression of diffuse lytic bone metastases with new pathologic fractures involving T9 and L4 vertebra. 3. Multiple low-attenuation lesions within the liver, worrisome for metastatic disease. Not previously characterized. Electronically Signed   By: Kerby Moors M.D.   On: 03/25/2018 18:17   Dg Chest Port 1 View  Result Date: 03/29/2018 CLINICAL DATA:  Respiratory failure. EXAM: PORTABLE CHEST 1 VIEW COMPARISON:  03/27/2018 FINDINGS: The endotracheal tube remains in good position, approximately 3 cm above the carina. The NG tube is coursing down the esophagus and into the stomach. The tip is in the fundal region. The left subclavian Port-A-Cath is in good position without complicating  features. The cardiac silhouette, mediastinal and hilar contours are within normal limits and stable. Improved right basilar aeration with improved atelectasis. There is a small left pleural effusion and left basilar atelectasis. Remote left lower rib trauma. IMPRESSION: 1. Stable support apparatus. 2. Improved right basilar aeration with decrease in right lower lobe atelectasis. 3. Small left effusion and overlying atelectasis. Electronically Signed   By: Marijo Sanes M.D.   On: 03/29/2018 09:21   Dg Chest Port 1 View  Result Date: 03/27/2018 CLINICAL DATA:  58 year old female status post intubation. EXAM: PORTABLE CHEST 1 VIEW COMPARISON:  Earlier radiograph dated 03/27/2018 FINDINGS: An endotracheal tube is noted with tip close to the carina. Recommend retraction by approximately 2-3 cm for optimal positioning. Interval placement of an enteric tube which appears to extend into the distal stomach and loops back with tip in the region of the gastric fundus. Left pectoral Port-A-Cath in similar position. Slight improved aeration of the lungs. The appearance of the lungs and heart is otherwise similar to prior radiograph. IMPRESSION: 1. Endotracheal tube close to the carina. Recommend retraction by approximately 2-3 cm for optimal positioning. 2. Enteric tube with tip in the region of the gastric fundus. Electronically Signed   By: Anner Crete M.D.   On: 03/27/2018 22:46   Dg Chest Port 1 View  Result Date: 03/27/2018 CLINICAL DATA:  Fever and shortness of breath EXAM: PORTABLE CHEST 1 VIEW COMPARISON:  Yesterday FINDINGS: Porta catheter on the left with tip at the upper cavoatrial junction. Low volumes with interstitial coarsening and hazy density at the left base. There is improved aeration at the left base with better visualized diaphragm. Healing right-sided rib fractures. Known osseous metastatic disease. IMPRESSION: 1. Mild improvement in aeration at the left base. 2. Small pleural effusions known  by recent chest CT. There is mildly improved aeration at the left base. Electronically Signed   By: Monte Fantasia M.D.   On: 03/27/2018 09:56   Dg Chest Port 1 View  Result Date: 03/26/2018 CLINICAL DATA:  Increased rate of breathing. EXAM: PORTABLE CHEST 1 VIEW COMPARISON:  CT yesterday. Radiograph 03/08/2018 FINDINGS: Left chest port tip in the SVC. Bilateral pleural effusions and adjacent atelectasis, more apparent on the left and progressed from prior radiograph. Normal heart size and mediastinal contours. Mild vascular congestion without overt pulmonary edema. No pneumothorax. Again seen bilateral rib fractures, some with have callus formation. IMPRESSION: Vascular congestion. Bilateral pleural effusions and adjacent atelectasis, more apparent on the left, likely increased from radiograph 2 days ago. Electronically Signed   By: Keith Rake M.D.   On: 03/26/2018 06:19   Vas [place] Lower Extremity Venous (dvt)  Result  Date: 03/27/2018  Lower Venous Study Indications: FUO.  Limitations: Body habitus, poor ultrasound/tissue interface and patient immobility, patient movement. Performing Technologist: Oliver Hum RVT  Examination Guidelines: A complete evaluation includes B-mode imaging, spectral Doppler, color Doppler, and power Doppler as needed of all accessible portions of each vessel. Bilateral testing is considered an integral part of a complete examination. Limited examinations for reoccurring indications may be performed as noted.  Right Venous Findings: +---------+---------------+---------+-----------+----------+-------+          CompressibilityPhasicitySpontaneityPropertiesSummary +---------+---------------+---------+-----------+----------+-------+ CFV      Full           Yes      Yes                          +---------+---------------+---------+-----------+----------+-------+ SFJ      Full                                                  +---------+---------------+---------+-----------+----------+-------+ FV Prox  Full                                                 +---------+---------------+---------+-----------+----------+-------+ FV Mid   Full                                                 +---------+---------------+---------+-----------+----------+-------+ FV DistalFull                                                 +---------+---------------+---------+-----------+----------+-------+ PFV      Full                                                 +---------+---------------+---------+-----------+----------+-------+ POP      Full           Yes      Yes                          +---------+---------------+---------+-----------+----------+-------+ PTV      Full                                                 +---------+---------------+---------+-----------+----------+-------+ PERO     Full                                                 +---------+---------------+---------+-----------+----------+-------+  Left Venous Findings: +---------+---------------+---------+-----------+----------+-------+          CompressibilityPhasicitySpontaneityPropertiesSummary +---------+---------------+---------+-----------+----------+-------+ CFV      Full  Yes      Yes                          +---------+---------------+---------+-----------+----------+-------+ SFJ      Full                                                 +---------+---------------+---------+-----------+----------+-------+ FV Prox  Full                                                 +---------+---------------+---------+-----------+----------+-------+ FV Mid   Full                                                 +---------+---------------+---------+-----------+----------+-------+ FV DistalFull                                                  +---------+---------------+---------+-----------+----------+-------+ PFV      Full                                                 +---------+---------------+---------+-----------+----------+-------+ POP      Full           Yes      Yes                          +---------+---------------+---------+-----------+----------+-------+ PTV      Full                                                 +---------+---------------+---------+-----------+----------+-------+ PERO     Full                                                 +---------+---------------+---------+-----------+----------+-------+    Summary: Right: There is no evidence of deep vein thrombosis in the lower extremity. No cystic structure found in the popliteal fossa. Left: There is no evidence of deep vein thrombosis in the lower extremity. No cystic structure found in the popliteal fossa.  *See table(s) above for measurements and observations. Electronically signed by Ruta Hinds MD on 03/27/2018 at 5:38:53 PM.    Final    Dg Fluoro Guide Lumbar Puncture  Result Date: 03/27/2018 CLINICAL DATA:  Altered mental status and fever. History of breast cancer EXAM: DIAGNOSTIC LUMBAR PUNCTURE UNDER FLUOROSCOPIC GUIDANCE FLUOROSCOPY TIME:  Fluoroscopy Time:  0.3 minutes Radiation Exposure Index (if provided by the fluoroscopic device): 2 mGy Number of Acquired Spot Images: 0 PROCEDURE: Informed consent was obtained from the the patient's next  of kin (her daughter) prior to the procedure. With the patient prone, the lower back was prepped with Betadine. 1% Lidocaine was used for local anesthesia. Lumbar puncture was performed at the L3-4 level using a 20 gauge needle with return of clear CSF. The patient was placed prone with great difficulty and decubitus positioning for accurate opening pressure was not feasible. 12 ml of CSF were obtained for laboratory studies. The patient tolerated the procedure well and there were no apparent  complications. As the patient was leaving that apartment there may have been a seizure per report. When I saw the patient she had returned to recent baseline and was oxygenating normally. The patient's nurse traveled with the patient and witnessed the event. IMPRESSION: Successful lumbar puncture at L3-4. Possible seizure activity after the procedure, not witnessed by me. Electronically Signed   By: Monte Fantasia M.D.   On: 03/27/2018 15:32    Microbiology: Recent Results (from the past 240 hour(s))  Culture, blood (Routine x 2)     Status: None   Collection Time: 03/06/2018  2:52 PM  Result Value Ref Range Status   Specimen Description   Final    BLOOD LEFT CHEST Performed at [facility] [address], [place], [place] [address]    Special Requests   Final    BOTTLES DRAWN AEROBIC AND ANAEROBIC Blood Culture adequate volume Performed at [facility] [address], [place], [place] [address]    Culture   Final    NO GROWTH 5 DAYS Performed at [facility], [place] [address], [place], [place] [address]    Report Status 03/29/2018 FINAL  Final  Urine culture     Status: None   Collection Time: 02/25/2018  7:16 PM  Result Value Ref Range Status   Specimen Description   Final    URINE, CLEAN CATCH Performed at [facility], [place] [address] [place], [place] [address]    Special Requests   Final    NONE Performed at [facility], [place] [address], [place], [place] [address]    Culture   Final    NO GROWTH Performed at [facility], [place] [address], [place], [place] [address]    Report Status 03/26/2018 FINAL  Final  Respiratory Panel by PCR     Status: None   Collection Time: 03/25/18 12:34 AM  Result Value Ref Range Status   Adenovirus NOT DETECTED NOT DETECTED Final   Coronavirus 229E NOT DETECTED NOT DETECTED Final   Coronavirus HKU1 NOT DETECTED NOT DETECTED Final   Coronavirus NL63 NOT  DETECTED NOT DETECTED Final   Coronavirus OC43 NOT DETECTED NOT DETECTED Final   Metapneumovirus NOT DETECTED NOT DETECTED Final   Rhinovirus / Enterovirus NOT DETECTED NOT DETECTED Final   Influenza A NOT DETECTED NOT DETECTED Final   Influenza B NOT DETECTED NOT DETECTED Final   Parainfluenza Virus 1 NOT DETECTED NOT DETECTED Final   Parainfluenza Virus 2 NOT DETECTED NOT DETECTED Final   Parainfluenza Virus 3 NOT DETECTED NOT DETECTED Final   Parainfluenza Virus 4 NOT DETECTED NOT DETECTED Final   Respiratory Syncytial Virus NOT DETECTED NOT DETECTED Final   Bordetella pertussis NOT DETECTED NOT DETECTED Final   Chlamydophila pneumoniae NOT DETECTED NOT DETECTED Final   Mycoplasma pneumoniae NOT DETECTED NOT DETECTED Final    Comment: Performed at [facility], [place] [address], [place], [place] [address]  Culture, blood (Routine X 2)  w Reflex to ID Panel     Status: None   Collection Time: 03/26/18 10:25 AM  Result Value Ref Range Status   Specimen Description   Final    BLOOD LEFT HAND Performed at [facility] [address], [place], [place] [address]    Special Requests   Final    BOTTLES DRAWN AEROBIC ONLY Blood Culture adequate volume Performed at [facility] [address], [place], [place] [address]    Culture   Final    NO GROWTH 5 DAYS Performed at [facility], [place] [address] [place], [place] [address]    Report Status 03/31/2018 FINAL  Final  Culture, blood (Routine X 2) w Reflex to ID Panel     Status: None   Collection Time: 03/26/18 10:26 AM  Result Value Ref Range Status   Specimen Description   Final    BLOOD RIGHT ARM Performed at [facility], [place] [address] [place], [place] [address]    Special Requests   Final    BOTTLES DRAWN AEROBIC ONLY Blood Culture adequate volume Performed at [facility] [address], [place], [place] [address]    Culture   Final    NO GROWTH 5  DAYS Performed at [facility], [place] [address], [place], [place] [address]    Report Status 03/31/2018 FINAL  Final  CSF culture     Status: None   Collection Time: 03/27/18  3:03 PM  Result Value Ref Range Status   Specimen Description   Final    CSF Performed at [facility] [address] [place], [place] [address]    Special Requests   Final    NONE Performed at [facility], [place] [address] [place], [place] [address]    Gram Stain   Final    WBC PRESENT, PREDOMINANTLY MONONUCLEAR NO ORGANISMS SEEN Gram Stain Report Called to,Read Back By and Verified With: C.JONES,RN 03/27/18 @1724  BY V.WILKINS Performed at [facility], [place] [address] [place],  [address]    Culture   Final    NO GROWTH 3 DAYS Performed at [facility], [place] [address] [place],  [address]    Report Status 03/31/2018 FINAL  Final  MRSA PCR Screening     Status: None   Collection Time: 03/27/18  6:39 PM  Result Value Ref Range Status   MRSA by PCR NEGATIVE NEGATIVE Final    Comment:        The GeneXpert MRSA Assay (FDA approved for NASAL specimens only), is one component of a comprehensive MRSA colonization surveillance program. It is not intended to diagnose MRSA infection nor to guide or monitor treatment for MRSA infections. Performed at [facility], [place] [address], [place],  [address]      Labs: Basic Metabolic Panel: Recent Labs  Lab 03/28/18 0609 03/28/18 1350 03/29/18 0417  NA 128* 130* 134*  K 2.4* 3.6 3.1*  CL 85* 90* 92*  CO2 27 28 27   GLUCOSE 114* 131* 111*  BUN 17 16 18   CREATININE 0.83 0.92 0.70  CALCIUM 9.8 10.1 10.3  MG  --   --  1.8  PHOS  --   --  3.0   Liver Function Tests: No results for input(s): AST, ALT, ALKPHOS, BILITOT, PROT, ALBUMIN in the last 168 hours. No results for input(s): LIPASE, AMYLASE in the last 168 hours. No results  for input(s): AMMONIA in the  last 168 hours. CBC: Recent Labs  Lab 03/28/18 0609 03/29/18 0417  WBC 11.7* 11.8*  HGB 8.3* 8.7*  HCT 27.1* 27.2*  MCV 89.4 90.4  PLT 129* 143*   Cardiac Enzymes: No results for input(s): CKTOTAL, CKMB, CKMBINDEX, TROPONINI in the last 168 hours. D-Dimer No results for input(s): DDIMER in the last 72 hours. BNP: Invalid input(s): POCBNP CBG: No results for input(s): GLUCAP in the last 168 hours. Anemia work up No results for input(s): VITAMINB12, FOLATE, FERRITIN, TIBC, IRON, RETICCTPCT in the last 72 hours. Urinalysis    Component Value Date/Time   COLORURINE YELLOW 03/21/2018 1916   APPEARANCEUR HAZY (A) 02/28/2018 1916   LABSPEC 1.023 03/22/2018 1916   PHURINE 6.0 03/04/2018 1916   GLUCOSEU NEGATIVE 03/18/2018 1916   HGBUR NEGATIVE 02/26/2018 1916   BILIRUBINUR NEGATIVE 03/12/2018 1916   KETONESUR 20 (A) 03/18/2018 1916   PROTEINUR 30 (A) 03/15/2018 1916   NITRITE NEGATIVE 03/09/2018 1916   LEUKOCYTESUR TRACE (A) 03/25/2018 1916   Sepsis Labs Invalid input(s): PROCALCITONIN,  WBC,  LACTICIDVEN     SIGNED:  Georgette Shell, MD  [facility] 04/03/2018, 2:39 PM Pager   If 7PM-7AM, please contact night-coverage [email or web address] Password [email or web address]

## 2018-04-26 DEATH — deceased

## 2018-04-28 ENCOUNTER — Ambulatory Visit: Payer: Medicare Other

## 2018-04-28 ENCOUNTER — Other Ambulatory Visit: Payer: Medicare Other

## 2019-07-02 IMAGING — CT CT HEAD W/O CM
4 series · 16 of 47 positions shown, 18 images · non-contrast
Comparison: None.

ADDENDUM:
Bone windows demonstrate multiple tiny lucencies throughout the
skull without expansile change. Lucent lesions with mild expansion
demonstrated in the skull base involving the occipital condyles
bilaterally. This is greater on the left. Destructive bone lesion
with a raised cortical flap involving the right pedicle and right
anterior arch of the C1 vertebra. Small lucent lesion demonstrated
in the odontoid process. Destructive expansile lesion in the left
sphenoid bone.
CLINICAL DATA: Syncope

EXAM:
CT HEAD WITHOUT CONTRAST
TECHNIQUE: Contiguous axial images were obtained from the base of the skull
through the vertex without intravenous contrast.

[Series 3: head wo · axial · 0.42mm/px · z∈[-116,-0]mm · 7 of 31 slices shown, 9 images]
[im 4/31  brain]
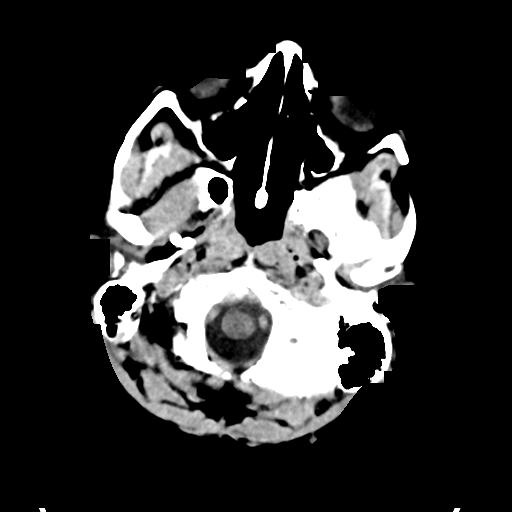
[im 4/31  bone]
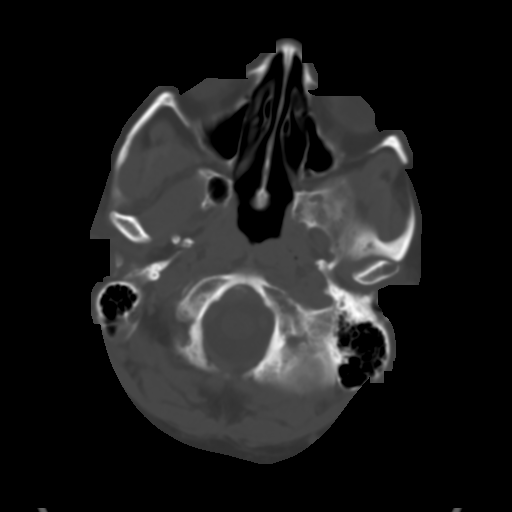
[im 8/31  brain]
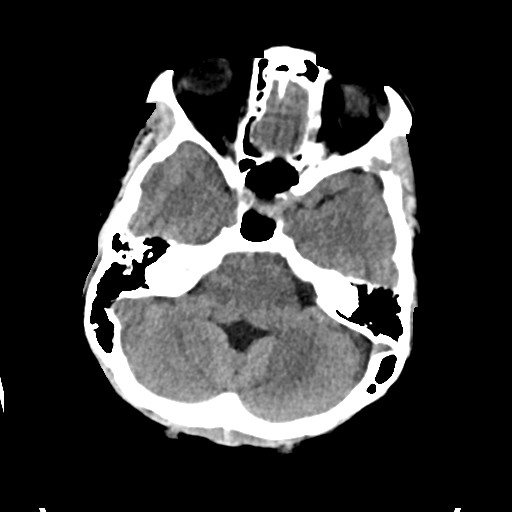
[im 12/31  brain]
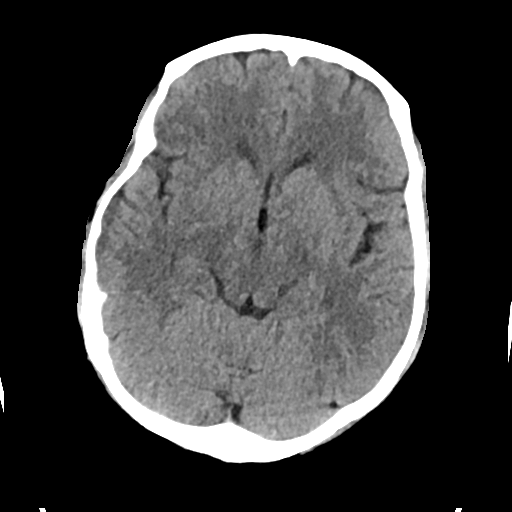
[im 16/31  brain]
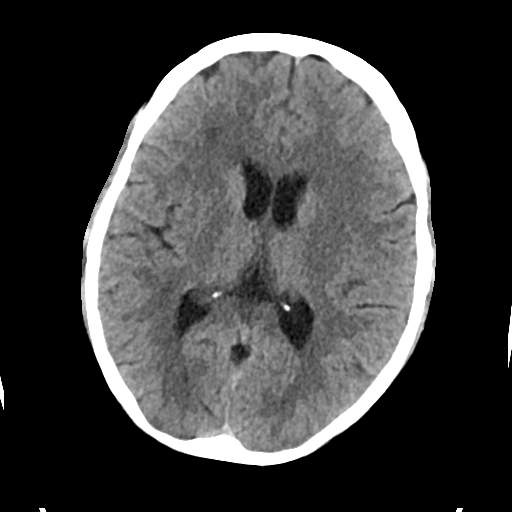
[im 19/31  brain]
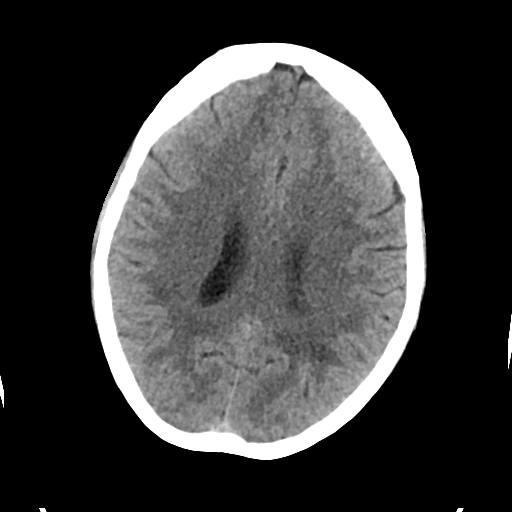
[im 19/31  bone]
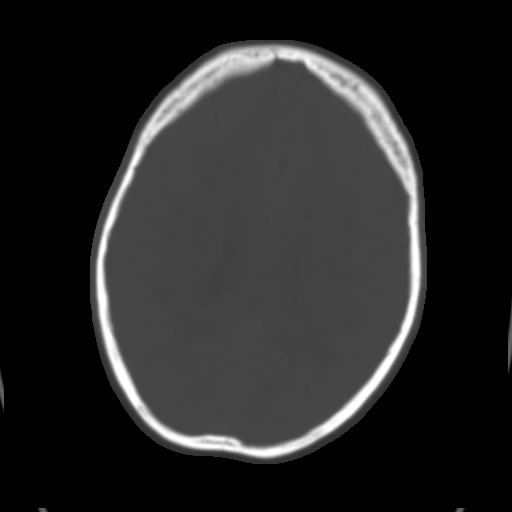
[im 23/31  brain]
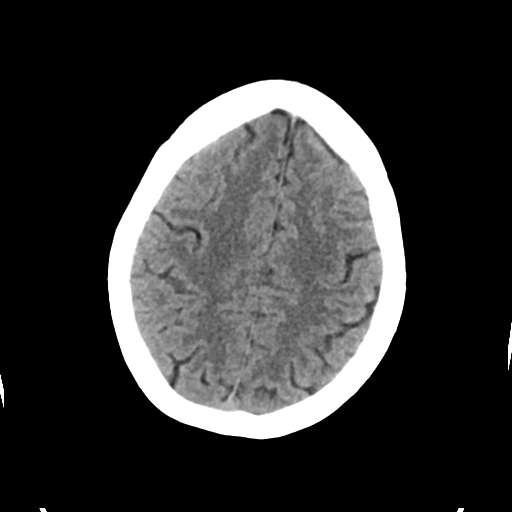
[im 27/31  brain]
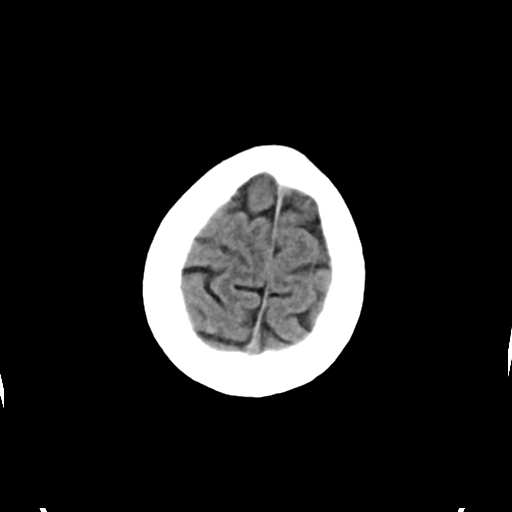

[Series 4: head bone · axial · 0.42mm/px · z∈[-116,-86]mm · 3 of 76 slices shown]
[im 8/76  bone]
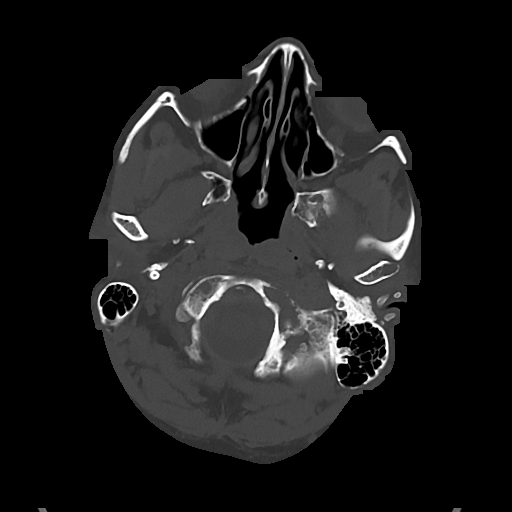
[im 16/76  bone]
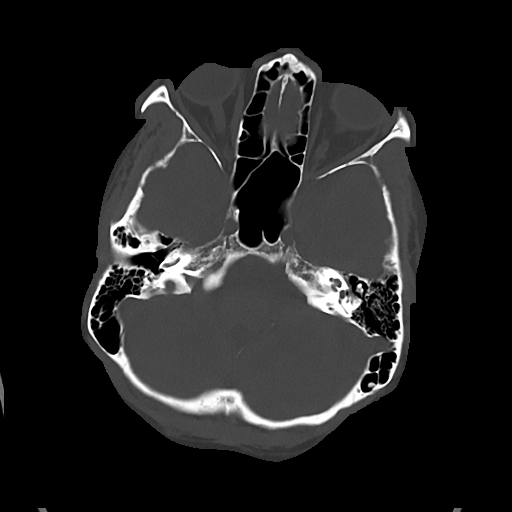
[im 23/76  bone]
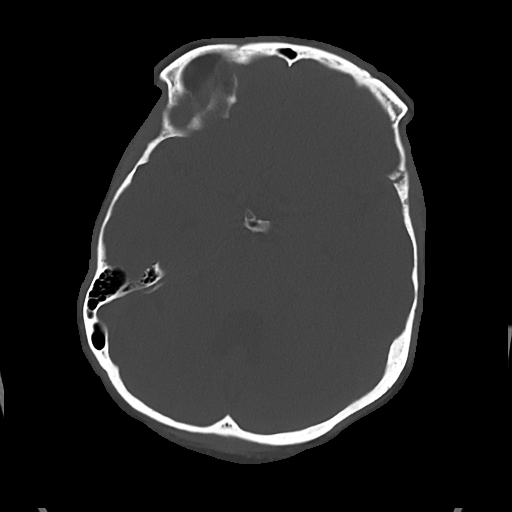

[Series 5: cor soft · coronal · 0.29mm/px · 3 of 67 slices shown]
[im 23/67  brain]
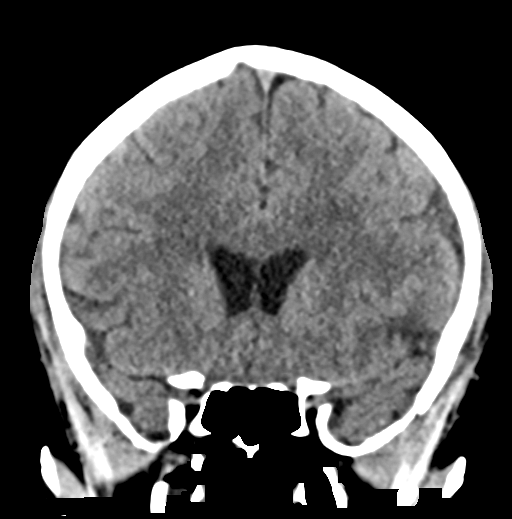
[im 30/67  brain]
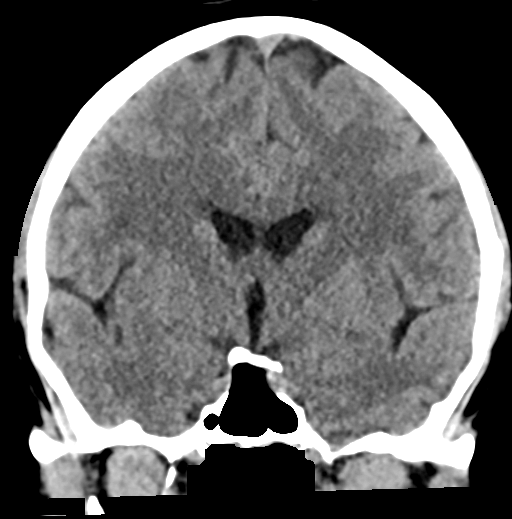
[im 37/67  brain]
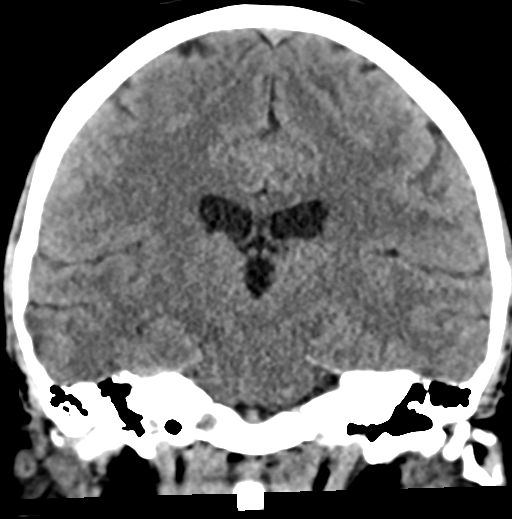

[Series 6: sag soft · sagittal · 0.29mm/px · 3 of 49 slices shown]
[im 17/49  brain]
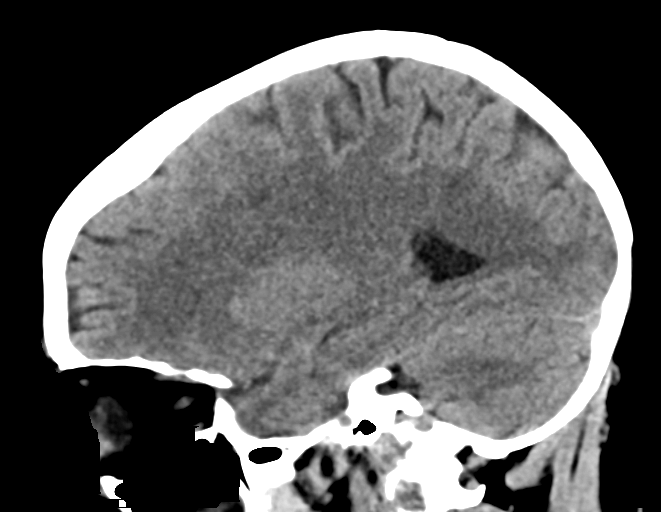
[im 25/49  brain]
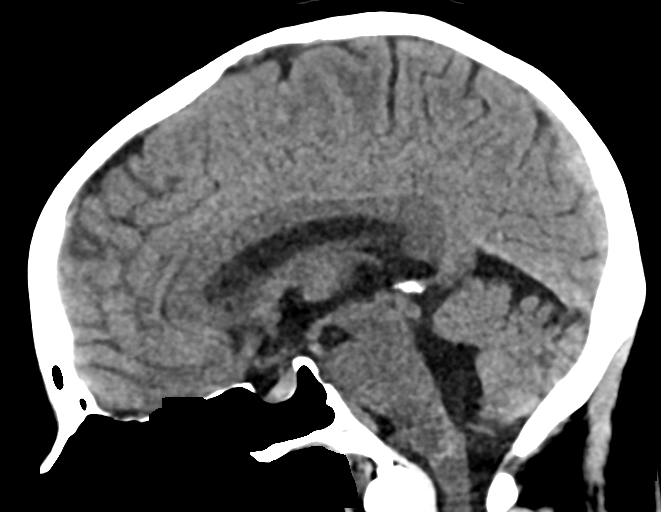
[im 33/49  brain]
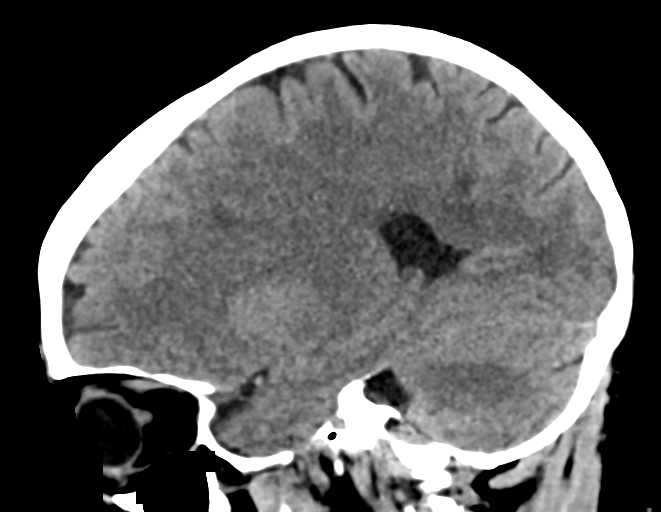

[16 of 47 positions shown; findings below may reference images not displayed]

IMPRESSION: Multiple lucent lesions demonstrated in the calvarium, skull base,
and in the visualized C1 and C2 vertebrae. Changes likely to
represent multiple myeloma or lucent bone metastases.
FINDINGS: Brain: There is low-density in the periventricular white matter,
adjacent to the frontal horns, in the left frontal lobe subcortical
white matter, as well as the bilateral parietal lobes. No mass
effect, midline shift, or acute hemorrhage. Ventricular system is
within normal limits.

Vascular: No hyperdense vessel or unexpected calcification.

Skull: Cranium is intact.

Sinuses/Orbits: There is mucous material in the right maxillary
sinus. Mastoid air cells and remainder of the visualized paranasal
sinuses are clear. Orbits are within normal limits.

Other: Noncontributory
IMPRESSION: There is patchy low density in the supratentorial white matter most
likely related to chronic ischemic changes of small vessel disease.
No acute intracranial pathology.
